# Patient Record
Sex: Female | Born: 1950
Health system: Southern US, Community
[De-identification: ages and names within clinical notes are randomized; demographics above are authoritative.]

## PROBLEM LIST (undated history)

## (undated) DIAGNOSIS — I10 Essential (primary) hypertension: Secondary | ICD-10-CM

## (undated) DIAGNOSIS — Z9889 Other specified postprocedural states: Secondary | ICD-10-CM

## (undated) DIAGNOSIS — M2550 Pain in unspecified joint: Secondary | ICD-10-CM

## (undated) DIAGNOSIS — F329 Major depressive disorder, single episode, unspecified: Secondary | ICD-10-CM

## (undated) DIAGNOSIS — K219 Gastro-esophageal reflux disease without esophagitis: Secondary | ICD-10-CM

## (undated) DIAGNOSIS — L4050 Arthropathic psoriasis, unspecified: Secondary | ICD-10-CM

## (undated) DIAGNOSIS — F419 Anxiety disorder, unspecified: Secondary | ICD-10-CM

## (undated) DIAGNOSIS — K589 Irritable bowel syndrome without diarrhea: Secondary | ICD-10-CM

## (undated) DIAGNOSIS — E669 Obesity, unspecified: Secondary | ICD-10-CM

## (undated) DIAGNOSIS — M1990 Unspecified osteoarthritis, unspecified site: Secondary | ICD-10-CM

## (undated) DIAGNOSIS — K759 Inflammatory liver disease, unspecified: Secondary | ICD-10-CM

## (undated) DIAGNOSIS — R112 Nausea with vomiting, unspecified: Secondary | ICD-10-CM

## (undated) DIAGNOSIS — Z8719 Personal history of other diseases of the digestive system: Secondary | ICD-10-CM

## (undated) DIAGNOSIS — J189 Pneumonia, unspecified organism: Secondary | ICD-10-CM

## (undated) DIAGNOSIS — D649 Anemia, unspecified: Secondary | ICD-10-CM

## (undated) DIAGNOSIS — F32A Depression, unspecified: Secondary | ICD-10-CM

## (undated) DIAGNOSIS — I2699 Other pulmonary embolism without acute cor pulmonale: Secondary | ICD-10-CM

## (undated) DIAGNOSIS — I7300 Raynaud's syndrome without gangrene: Secondary | ICD-10-CM

## (undated) DIAGNOSIS — Z79899 Other long term (current) drug therapy: Secondary | ICD-10-CM

## (undated) DIAGNOSIS — I499 Cardiac arrhythmia, unspecified: Secondary | ICD-10-CM

## (undated) HISTORY — PX: JOINT REPLACEMENT: SHX530

## (undated) HISTORY — DX: Anemia, unspecified: D64.9

## (undated) HISTORY — DX: Unspecified osteoarthritis, unspecified site: M19.90

## (undated) HISTORY — DX: Irritable bowel syndrome, unspecified: K58.9

## (undated) HISTORY — DX: Essential (primary) hypertension: I10

## (undated) HISTORY — DX: Pain in unspecified joint: M25.50

## (undated) HISTORY — DX: Arthropathic psoriasis, unspecified: L40.50

## (undated) HISTORY — DX: Gastro-esophageal reflux disease without esophagitis: K21.9

## (undated) HISTORY — PX: TUBAL LIGATION: SHX77

## (undated) HISTORY — DX: Obesity, unspecified: E66.9

## (undated) HISTORY — DX: Raynaud's syndrome without gangrene: I73.00

## (undated) HISTORY — DX: Other long term (current) drug therapy: Z79.899

## (undated) HISTORY — PX: GASTRIC BYPASS: SHX52

## (undated) HISTORY — PX: ABDOMINAL HYSTERECTOMY: SHX81

## (undated) HISTORY — PX: COLONOSCOPY: SHX174

## (undated) HISTORY — DX: Anxiety disorder, unspecified: F41.9

## (undated) HISTORY — PX: HERNIA REPAIR: SHX51

## (undated) HISTORY — PX: APPENDECTOMY: SHX54

---

## 1976-02-19 HISTORY — PX: GASTRIC BYPASS: SHX52

## 1980-02-19 HISTORY — PX: TUBAL LIGATION: SHX77

## 1997-07-06 ENCOUNTER — Encounter: Admission: RE | Admit: 1997-07-06 | Discharge: 1997-07-06 | Payer: Self-pay | Admitting: Family Medicine

## 1997-07-11 ENCOUNTER — Encounter: Admission: RE | Admit: 1997-07-11 | Discharge: 1997-07-11 | Payer: Self-pay | Admitting: Sports Medicine

## 1997-07-18 ENCOUNTER — Encounter: Admission: RE | Admit: 1997-07-18 | Discharge: 1997-07-18 | Payer: Self-pay | Admitting: Family Medicine

## 1997-10-02 ENCOUNTER — Emergency Department (HOSPITAL_COMMUNITY): Admission: EM | Admit: 1997-10-02 | Discharge: 1997-10-02 | Payer: Self-pay | Admitting: Emergency Medicine

## 1997-11-17 ENCOUNTER — Encounter: Admission: RE | Admit: 1997-11-17 | Discharge: 1997-11-17 | Payer: Self-pay | Admitting: Sports Medicine

## 1997-11-19 ENCOUNTER — Ambulatory Visit (HOSPITAL_COMMUNITY): Admission: RE | Admit: 1997-11-19 | Discharge: 1997-11-19 | Payer: Self-pay | Admitting: Sports Medicine

## 1997-11-23 ENCOUNTER — Ambulatory Visit: Admission: RE | Admit: 1997-11-23 | Discharge: 1997-11-23 | Payer: Self-pay | Admitting: Obstetrics and Gynecology

## 1998-03-09 ENCOUNTER — Encounter: Admission: RE | Admit: 1998-03-09 | Discharge: 1998-03-09 | Payer: Self-pay | Admitting: Sports Medicine

## 1998-03-21 HISTORY — PX: ABDOMINAL HYSTERECTOMY: SHX81

## 1998-04-17 ENCOUNTER — Encounter: Payer: Self-pay | Admitting: Obstetrics and Gynecology

## 1998-04-17 ENCOUNTER — Observation Stay (HOSPITAL_COMMUNITY): Admission: RE | Admit: 1998-04-17 | Discharge: 1998-04-18 | Payer: Self-pay | Admitting: Obstetrics and Gynecology

## 1998-05-04 ENCOUNTER — Encounter: Payer: Self-pay | Admitting: Urology

## 1998-05-04 ENCOUNTER — Ambulatory Visit (HOSPITAL_COMMUNITY): Admission: RE | Admit: 1998-05-04 | Discharge: 1998-05-04 | Payer: Self-pay | Admitting: Urology

## 1998-06-06 ENCOUNTER — Encounter: Admission: RE | Admit: 1998-06-06 | Discharge: 1998-06-06 | Payer: Self-pay | Admitting: Family Medicine

## 1998-06-26 ENCOUNTER — Ambulatory Visit (HOSPITAL_COMMUNITY): Admission: RE | Admit: 1998-06-26 | Discharge: 1998-06-26 | Payer: Self-pay | Admitting: Urology

## 1998-06-26 ENCOUNTER — Encounter: Payer: Self-pay | Admitting: Urology

## 1998-07-14 ENCOUNTER — Encounter: Admission: RE | Admit: 1998-07-14 | Discharge: 1998-07-14 | Payer: Self-pay | Admitting: Sports Medicine

## 1998-09-19 ENCOUNTER — Encounter: Admission: RE | Admit: 1998-09-19 | Discharge: 1998-09-19 | Payer: Self-pay | Admitting: Family Medicine

## 1998-12-21 ENCOUNTER — Encounter: Admission: RE | Admit: 1998-12-21 | Discharge: 1998-12-21 | Payer: Self-pay | Admitting: Sports Medicine

## 1998-12-25 ENCOUNTER — Ambulatory Visit (HOSPITAL_COMMUNITY): Admission: RE | Admit: 1998-12-25 | Discharge: 1998-12-25 | Payer: Self-pay | Admitting: Otolaryngology

## 1998-12-25 ENCOUNTER — Encounter: Payer: Self-pay | Admitting: Otolaryngology

## 1999-01-09 ENCOUNTER — Encounter: Admission: RE | Admit: 1999-01-09 | Discharge: 1999-01-09 | Payer: Self-pay | Admitting: Family Medicine

## 1999-01-18 ENCOUNTER — Encounter: Payer: Self-pay | Admitting: Sports Medicine

## 1999-01-18 ENCOUNTER — Ambulatory Visit (HOSPITAL_COMMUNITY): Admission: RE | Admit: 1999-01-18 | Discharge: 1999-01-18 | Payer: Self-pay | Admitting: Sports Medicine

## 1999-02-08 ENCOUNTER — Encounter: Admission: RE | Admit: 1999-02-08 | Discharge: 1999-02-08 | Payer: Self-pay | Admitting: Family Medicine

## 1999-02-22 ENCOUNTER — Encounter: Admission: RE | Admit: 1999-02-22 | Discharge: 1999-02-22 | Payer: Self-pay | Admitting: Family Medicine

## 1999-02-26 ENCOUNTER — Encounter: Admission: RE | Admit: 1999-02-26 | Discharge: 1999-02-26 | Payer: Self-pay | Admitting: Family Medicine

## 1999-02-28 ENCOUNTER — Encounter: Admission: RE | Admit: 1999-02-28 | Discharge: 1999-02-28 | Payer: Self-pay | Admitting: Family Medicine

## 1999-03-08 ENCOUNTER — Encounter: Admission: RE | Admit: 1999-03-08 | Discharge: 1999-03-08 | Payer: Self-pay | Admitting: Sports Medicine

## 1999-03-22 ENCOUNTER — Encounter: Admission: RE | Admit: 1999-03-22 | Discharge: 1999-03-22 | Payer: Self-pay | Admitting: Sports Medicine

## 1999-04-05 ENCOUNTER — Encounter: Admission: RE | Admit: 1999-04-05 | Discharge: 1999-04-05 | Payer: Self-pay | Admitting: Family Medicine

## 1999-04-19 ENCOUNTER — Encounter: Admission: RE | Admit: 1999-04-19 | Discharge: 1999-04-19 | Payer: Self-pay | Admitting: Family Medicine

## 1999-05-02 ENCOUNTER — Other Ambulatory Visit: Admission: RE | Admit: 1999-05-02 | Discharge: 1999-05-02 | Payer: Self-pay | Admitting: Obstetrics and Gynecology

## 1999-05-24 ENCOUNTER — Encounter: Admission: RE | Admit: 1999-05-24 | Discharge: 1999-05-24 | Payer: Self-pay | Admitting: Family Medicine

## 1999-06-25 ENCOUNTER — Encounter: Admission: RE | Admit: 1999-06-25 | Discharge: 1999-06-25 | Payer: Self-pay | Admitting: Family Medicine

## 1999-07-23 ENCOUNTER — Encounter: Admission: RE | Admit: 1999-07-23 | Discharge: 1999-07-23 | Payer: Self-pay | Admitting: Family Medicine

## 1999-10-20 HISTORY — PX: TOTAL KNEE ARTHROPLASTY: SHX125

## 1999-10-29 ENCOUNTER — Encounter: Payer: Self-pay | Admitting: Orthopaedic Surgery

## 1999-11-01 ENCOUNTER — Inpatient Hospital Stay (HOSPITAL_COMMUNITY): Admission: RE | Admit: 1999-11-01 | Discharge: 1999-11-06 | Payer: Self-pay | Admitting: Orthopaedic Surgery

## 1999-12-03 ENCOUNTER — Encounter: Admission: RE | Admit: 1999-12-03 | Discharge: 2000-03-02 | Payer: Self-pay | Admitting: Orthopaedic Surgery

## 2000-02-11 ENCOUNTER — Emergency Department (HOSPITAL_COMMUNITY): Admission: EM | Admit: 2000-02-11 | Discharge: 2000-02-11 | Payer: Self-pay | Admitting: Emergency Medicine

## 2000-02-11 ENCOUNTER — Encounter: Payer: Self-pay | Admitting: Emergency Medicine

## 2000-02-25 ENCOUNTER — Encounter: Admission: RE | Admit: 2000-02-25 | Discharge: 2000-02-25 | Payer: Self-pay | Admitting: Family Medicine

## 2000-03-03 ENCOUNTER — Encounter: Admission: RE | Admit: 2000-03-03 | Discharge: 2000-03-03 | Payer: Self-pay | Admitting: Family Medicine

## 2000-03-12 ENCOUNTER — Encounter: Payer: Self-pay | Admitting: Obstetrics and Gynecology

## 2000-03-12 ENCOUNTER — Ambulatory Visit (HOSPITAL_COMMUNITY): Admission: RE | Admit: 2000-03-12 | Discharge: 2000-03-12 | Payer: Self-pay | Admitting: Obstetrics and Gynecology

## 2000-03-23 ENCOUNTER — Encounter: Payer: Self-pay | Admitting: Emergency Medicine

## 2000-03-23 ENCOUNTER — Inpatient Hospital Stay (HOSPITAL_COMMUNITY): Admission: EM | Admit: 2000-03-23 | Discharge: 2000-03-24 | Payer: Self-pay | Admitting: Emergency Medicine

## 2000-04-08 ENCOUNTER — Encounter: Payer: Self-pay | Admitting: Interventional Cardiology

## 2000-04-08 ENCOUNTER — Ambulatory Visit (HOSPITAL_COMMUNITY): Admission: RE | Admit: 2000-04-08 | Discharge: 2000-04-08 | Payer: Self-pay | Admitting: Interventional Cardiology

## 2000-04-18 HISTORY — PX: LAPAROSCOPIC CHOLECYSTECTOMY: SUR755

## 2000-04-24 ENCOUNTER — Ambulatory Visit (HOSPITAL_COMMUNITY): Admission: RE | Admit: 2000-04-24 | Discharge: 2000-04-25 | Payer: Self-pay | Admitting: Surgery

## 2000-05-12 ENCOUNTER — Encounter: Admission: RE | Admit: 2000-05-12 | Discharge: 2000-05-12 | Payer: Self-pay | Admitting: Family Medicine

## 2000-08-07 ENCOUNTER — Encounter: Admission: RE | Admit: 2000-08-07 | Discharge: 2000-08-07 | Payer: Self-pay | Admitting: Sports Medicine

## 2000-09-15 ENCOUNTER — Encounter: Admission: RE | Admit: 2000-09-15 | Discharge: 2000-09-15 | Payer: Self-pay | Admitting: Family Medicine

## 2000-10-10 ENCOUNTER — Encounter: Admission: RE | Admit: 2000-10-10 | Discharge: 2000-10-10 | Payer: Self-pay | Admitting: Family Medicine

## 2000-11-14 ENCOUNTER — Encounter: Admission: RE | Admit: 2000-11-14 | Discharge: 2000-11-14 | Payer: Self-pay | Admitting: Family Medicine

## 2000-12-09 ENCOUNTER — Encounter: Admission: RE | Admit: 2000-12-09 | Discharge: 2000-12-09 | Payer: Self-pay | Admitting: Family Medicine

## 2000-12-12 ENCOUNTER — Encounter: Admission: RE | Admit: 2000-12-12 | Discharge: 2000-12-12 | Payer: Self-pay | Admitting: Family Medicine

## 2001-01-01 ENCOUNTER — Encounter: Admission: RE | Admit: 2001-01-01 | Discharge: 2001-01-01 | Payer: Self-pay | Admitting: Sports Medicine

## 2001-02-02 ENCOUNTER — Encounter: Admission: RE | Admit: 2001-02-02 | Discharge: 2001-02-02 | Payer: Self-pay | Admitting: Family Medicine

## 2001-03-18 ENCOUNTER — Encounter: Admission: RE | Admit: 2001-03-18 | Discharge: 2001-03-18 | Payer: Self-pay | Admitting: Family Medicine

## 2001-04-08 ENCOUNTER — Encounter: Admission: RE | Admit: 2001-04-08 | Discharge: 2001-04-08 | Payer: Self-pay | Admitting: Family Medicine

## 2001-04-14 ENCOUNTER — Other Ambulatory Visit: Admission: RE | Admit: 2001-04-14 | Discharge: 2001-04-14 | Payer: Self-pay | Admitting: Obstetrics and Gynecology

## 2001-04-16 ENCOUNTER — Encounter: Admission: RE | Admit: 2001-04-16 | Discharge: 2001-04-16 | Payer: Self-pay | Admitting: Sports Medicine

## 2001-06-25 ENCOUNTER — Encounter: Admission: RE | Admit: 2001-06-25 | Discharge: 2001-06-25 | Payer: Self-pay | Admitting: Sports Medicine

## 2001-09-11 ENCOUNTER — Encounter: Payer: Self-pay | Admitting: Sports Medicine

## 2001-09-11 ENCOUNTER — Ambulatory Visit (HOSPITAL_COMMUNITY): Admission: RE | Admit: 2001-09-11 | Discharge: 2001-09-11 | Payer: Self-pay | Admitting: Sports Medicine

## 2001-10-09 ENCOUNTER — Ambulatory Visit (HOSPITAL_COMMUNITY): Admission: RE | Admit: 2001-10-09 | Discharge: 2001-10-09 | Payer: Self-pay | Admitting: Gastroenterology

## 2001-10-26 ENCOUNTER — Ambulatory Visit (HOSPITAL_COMMUNITY): Admission: RE | Admit: 2001-10-26 | Discharge: 2001-10-26 | Payer: Self-pay | Admitting: Sports Medicine

## 2001-10-26 ENCOUNTER — Encounter: Admission: RE | Admit: 2001-10-26 | Discharge: 2001-10-26 | Payer: Self-pay | Admitting: Family Medicine

## 2001-10-26 ENCOUNTER — Encounter: Payer: Self-pay | Admitting: Sports Medicine

## 2001-11-10 ENCOUNTER — Encounter: Admission: RE | Admit: 2001-11-10 | Discharge: 2001-11-10 | Payer: Self-pay | Admitting: Sports Medicine

## 2002-01-07 ENCOUNTER — Encounter: Admission: RE | Admit: 2002-01-07 | Discharge: 2002-01-07 | Payer: Self-pay | Admitting: Family Medicine

## 2002-01-19 ENCOUNTER — Ambulatory Visit (HOSPITAL_COMMUNITY): Admission: RE | Admit: 2002-01-19 | Discharge: 2002-01-19 | Payer: Self-pay | Admitting: Obstetrics and Gynecology

## 2002-01-19 ENCOUNTER — Encounter: Payer: Self-pay | Admitting: Obstetrics and Gynecology

## 2002-04-29 ENCOUNTER — Encounter: Admission: RE | Admit: 2002-04-29 | Discharge: 2002-04-29 | Payer: Self-pay | Admitting: Sports Medicine

## 2002-06-08 ENCOUNTER — Encounter: Admission: RE | Admit: 2002-06-08 | Discharge: 2002-06-08 | Payer: Self-pay | Admitting: Sports Medicine

## 2002-06-14 ENCOUNTER — Ambulatory Visit (HOSPITAL_COMMUNITY): Admission: RE | Admit: 2002-06-14 | Discharge: 2002-06-14 | Payer: Self-pay | Admitting: *Deleted

## 2002-06-14 ENCOUNTER — Encounter: Payer: Self-pay | Admitting: *Deleted

## 2002-06-29 ENCOUNTER — Encounter: Admission: RE | Admit: 2002-06-29 | Discharge: 2002-06-29 | Payer: Self-pay | Admitting: Family Medicine

## 2002-07-14 ENCOUNTER — Encounter: Admission: RE | Admit: 2002-07-14 | Discharge: 2002-07-14 | Payer: Self-pay | Admitting: Sports Medicine

## 2002-09-19 HISTORY — PX: LAPAROSCOPIC INCISIONAL / UMBILICAL / VENTRAL HERNIA REPAIR: SUR789

## 2002-09-28 ENCOUNTER — Inpatient Hospital Stay (HOSPITAL_COMMUNITY): Admission: RE | Admit: 2002-09-28 | Discharge: 2002-09-30 | Payer: Self-pay | Admitting: General Surgery

## 2003-01-05 ENCOUNTER — Encounter: Admission: RE | Admit: 2003-01-05 | Discharge: 2003-01-05 | Payer: Self-pay | Admitting: Sports Medicine

## 2003-01-26 ENCOUNTER — Encounter: Admission: RE | Admit: 2003-01-26 | Discharge: 2003-01-26 | Payer: Self-pay | Admitting: Sports Medicine

## 2003-02-09 ENCOUNTER — Other Ambulatory Visit: Admission: RE | Admit: 2003-02-09 | Discharge: 2003-02-09 | Payer: Self-pay | Admitting: Obstetrics and Gynecology

## 2003-03-02 ENCOUNTER — Encounter: Admission: RE | Admit: 2003-03-02 | Discharge: 2003-03-02 | Payer: Self-pay | Admitting: Family Medicine

## 2003-05-25 ENCOUNTER — Encounter: Admission: RE | Admit: 2003-05-25 | Discharge: 2003-05-25 | Payer: Self-pay | Admitting: Sports Medicine

## 2003-06-08 ENCOUNTER — Encounter: Admission: RE | Admit: 2003-06-08 | Discharge: 2003-06-08 | Payer: Self-pay | Admitting: Family Medicine

## 2003-06-15 ENCOUNTER — Encounter: Admission: RE | Admit: 2003-06-15 | Discharge: 2003-06-15 | Payer: Self-pay | Admitting: Family Medicine

## 2003-06-15 ENCOUNTER — Ambulatory Visit (HOSPITAL_COMMUNITY): Admission: RE | Admit: 2003-06-15 | Discharge: 2003-06-15 | Payer: Self-pay | Admitting: Sports Medicine

## 2003-10-03 ENCOUNTER — Encounter: Admission: RE | Admit: 2003-10-03 | Discharge: 2003-10-03 | Payer: Self-pay | Admitting: Sports Medicine

## 2003-10-06 ENCOUNTER — Ambulatory Visit (HOSPITAL_COMMUNITY): Admission: RE | Admit: 2003-10-06 | Discharge: 2003-10-06 | Payer: Self-pay | Admitting: Orthopaedic Surgery

## 2003-10-17 ENCOUNTER — Ambulatory Visit (HOSPITAL_COMMUNITY): Admission: RE | Admit: 2003-10-17 | Discharge: 2003-10-17 | Payer: Self-pay | Admitting: Orthopaedic Surgery

## 2003-10-20 HISTORY — PX: REVISION TOTAL KNEE ARTHROPLASTY: SHX767

## 2003-11-01 ENCOUNTER — Ambulatory Visit: Payer: Self-pay | Admitting: Family Medicine

## 2003-11-15 ENCOUNTER — Inpatient Hospital Stay (HOSPITAL_COMMUNITY): Admission: RE | Admit: 2003-11-15 | Discharge: 2003-11-19 | Payer: Self-pay | Admitting: Orthopaedic Surgery

## 2004-01-05 ENCOUNTER — Ambulatory Visit: Payer: Self-pay | Admitting: Sports Medicine

## 2004-02-21 ENCOUNTER — Ambulatory Visit (HOSPITAL_COMMUNITY): Admission: RE | Admit: 2004-02-21 | Discharge: 2004-02-21 | Payer: Self-pay | Admitting: Obstetrics and Gynecology

## 2004-03-15 ENCOUNTER — Other Ambulatory Visit: Admission: RE | Admit: 2004-03-15 | Discharge: 2004-03-15 | Payer: Self-pay | Admitting: Obstetrics and Gynecology

## 2004-06-28 ENCOUNTER — Ambulatory Visit: Payer: Self-pay | Admitting: Family Medicine

## 2004-10-11 ENCOUNTER — Ambulatory Visit: Payer: Self-pay | Admitting: Sports Medicine

## 2004-12-20 ENCOUNTER — Ambulatory Visit: Payer: Self-pay | Admitting: Family Medicine

## 2005-01-08 ENCOUNTER — Ambulatory Visit: Payer: Self-pay | Admitting: Sports Medicine

## 2005-02-15 ENCOUNTER — Ambulatory Visit (HOSPITAL_COMMUNITY): Admission: RE | Admit: 2005-02-15 | Discharge: 2005-02-15 | Payer: Self-pay | Admitting: Sports Medicine

## 2005-03-06 ENCOUNTER — Ambulatory Visit: Payer: Self-pay | Admitting: Sports Medicine

## 2005-04-11 ENCOUNTER — Ambulatory Visit: Payer: Self-pay | Admitting: Sports Medicine

## 2005-06-15 ENCOUNTER — Emergency Department (HOSPITAL_COMMUNITY): Admission: EM | Admit: 2005-06-15 | Discharge: 2005-06-15 | Payer: Self-pay | Admitting: Family Medicine

## 2005-06-17 ENCOUNTER — Emergency Department (HOSPITAL_COMMUNITY): Admission: EM | Admit: 2005-06-17 | Discharge: 2005-06-17 | Payer: Self-pay | Admitting: Family Medicine

## 2005-07-01 ENCOUNTER — Ambulatory Visit: Payer: Self-pay | Admitting: Family Medicine

## 2005-07-01 ENCOUNTER — Ambulatory Visit (HOSPITAL_COMMUNITY): Admission: RE | Admit: 2005-07-01 | Discharge: 2005-07-01 | Payer: Self-pay | Admitting: Family Medicine

## 2005-07-11 ENCOUNTER — Ambulatory Visit: Payer: Self-pay | Admitting: Sports Medicine

## 2005-07-31 ENCOUNTER — Ambulatory Visit (HOSPITAL_COMMUNITY): Admission: RE | Admit: 2005-07-31 | Discharge: 2005-07-31 | Payer: Self-pay | Admitting: Orthopaedic Surgery

## 2005-11-06 ENCOUNTER — Ambulatory Visit (HOSPITAL_COMMUNITY): Admission: RE | Admit: 2005-11-06 | Discharge: 2005-11-06 | Payer: Self-pay | Admitting: Sports Medicine

## 2005-11-14 ENCOUNTER — Ambulatory Visit: Payer: Self-pay | Admitting: Sports Medicine

## 2006-03-13 ENCOUNTER — Ambulatory Visit: Payer: Self-pay | Admitting: Sports Medicine

## 2006-03-14 ENCOUNTER — Ambulatory Visit (HOSPITAL_COMMUNITY): Admission: RE | Admit: 2006-03-14 | Discharge: 2006-03-14 | Payer: Self-pay | Admitting: Vascular Surgery

## 2006-04-17 DIAGNOSIS — M2550 Pain in unspecified joint: Secondary | ICD-10-CM | POA: Insufficient documentation

## 2006-04-17 DIAGNOSIS — N952 Postmenopausal atrophic vaginitis: Secondary | ICD-10-CM | POA: Insufficient documentation

## 2006-04-17 DIAGNOSIS — K219 Gastro-esophageal reflux disease without esophagitis: Secondary | ICD-10-CM | POA: Insufficient documentation

## 2006-04-17 DIAGNOSIS — F410 Panic disorder [episodic paroxysmal anxiety] without agoraphobia: Secondary | ICD-10-CM | POA: Insufficient documentation

## 2006-04-17 DIAGNOSIS — E669 Obesity, unspecified: Secondary | ICD-10-CM | POA: Insufficient documentation

## 2006-04-17 DIAGNOSIS — K8020 Calculus of gallbladder without cholecystitis without obstruction: Secondary | ICD-10-CM | POA: Insufficient documentation

## 2006-05-20 ENCOUNTER — Encounter (HOSPITAL_COMMUNITY): Admission: RE | Admit: 2006-05-20 | Discharge: 2006-05-20 | Payer: Self-pay | Admitting: Interventional Cardiology

## 2006-07-03 ENCOUNTER — Ambulatory Visit: Payer: Self-pay | Admitting: Sports Medicine

## 2006-07-03 DIAGNOSIS — Z96659 Presence of unspecified artificial knee joint: Secondary | ICD-10-CM | POA: Insufficient documentation

## 2006-07-03 LAB — CONVERTED CEMR LAB
HCT: 39 %
MCV: 88.7 fL
Platelets: 223 10*3/uL
RBC: 4.39 M/uL
Vitamin B-12: 207 pg/mL — ABNORMAL LOW (ref 211–911)

## 2006-07-07 ENCOUNTER — Ambulatory Visit: Payer: Self-pay | Admitting: Sports Medicine

## 2006-08-12 ENCOUNTER — Encounter: Payer: Self-pay | Admitting: Sports Medicine

## 2006-10-02 ENCOUNTER — Ambulatory Visit: Payer: Self-pay | Admitting: Sports Medicine

## 2006-10-02 DIAGNOSIS — M7750 Other enthesopathy of unspecified foot: Secondary | ICD-10-CM | POA: Insufficient documentation

## 2007-02-18 ENCOUNTER — Encounter: Payer: Self-pay | Admitting: Sports Medicine

## 2007-02-19 HISTORY — PX: REVISION TOTAL KNEE ARTHROPLASTY: SHX767

## 2007-03-02 ENCOUNTER — Inpatient Hospital Stay (HOSPITAL_COMMUNITY): Admission: RE | Admit: 2007-03-02 | Discharge: 2007-03-06 | Payer: Self-pay | Admitting: Orthopedic Surgery

## 2007-03-16 ENCOUNTER — Encounter: Payer: Self-pay | Admitting: Sports Medicine

## 2007-03-23 ENCOUNTER — Encounter: Admission: RE | Admit: 2007-03-23 | Discharge: 2007-05-27 | Payer: Self-pay | Admitting: Orthopedic Surgery

## 2007-04-07 ENCOUNTER — Encounter: Payer: Self-pay | Admitting: *Deleted

## 2007-06-24 ENCOUNTER — Encounter: Payer: Self-pay | Admitting: Sports Medicine

## 2007-07-01 ENCOUNTER — Ambulatory Visit (HOSPITAL_COMMUNITY): Admission: RE | Admit: 2007-07-01 | Discharge: 2007-07-01 | Payer: Self-pay | Admitting: Sports Medicine

## 2007-07-09 ENCOUNTER — Ambulatory Visit: Payer: Self-pay | Admitting: Sports Medicine

## 2007-07-10 LAB — CONVERTED CEMR LAB
MCHC: 32.9 g/dL (ref 30.0–36.0)
Platelets: 187 10*3/uL (ref 150–400)
RDW: 15.2 % (ref 11.5–15.5)
TSH: 2.313 microintl units/mL (ref 0.350–5.50)

## 2007-07-28 ENCOUNTER — Ambulatory Visit (HOSPITAL_COMMUNITY): Admission: RE | Admit: 2007-07-28 | Discharge: 2007-07-28 | Payer: Self-pay | Admitting: Gastroenterology

## 2007-07-28 ENCOUNTER — Encounter (INDEPENDENT_AMBULATORY_CARE_PROVIDER_SITE_OTHER): Payer: Self-pay | Admitting: Gastroenterology

## 2007-07-31 ENCOUNTER — Encounter: Payer: Self-pay | Admitting: *Deleted

## 2007-08-05 ENCOUNTER — Encounter: Payer: Self-pay | Admitting: Sports Medicine

## 2007-10-07 ENCOUNTER — Ambulatory Visit: Payer: Self-pay | Admitting: Family Medicine

## 2007-10-30 ENCOUNTER — Ambulatory Visit: Payer: Self-pay | Admitting: Family Medicine

## 2007-10-30 DIAGNOSIS — R002 Palpitations: Secondary | ICD-10-CM | POA: Insufficient documentation

## 2007-11-23 ENCOUNTER — Telehealth: Payer: Self-pay | Admitting: *Deleted

## 2007-12-04 ENCOUNTER — Telehealth: Payer: Self-pay | Admitting: *Deleted

## 2007-12-21 ENCOUNTER — Telehealth (INDEPENDENT_AMBULATORY_CARE_PROVIDER_SITE_OTHER): Payer: Self-pay | Admitting: *Deleted

## 2007-12-21 ENCOUNTER — Ambulatory Visit: Payer: Self-pay | Admitting: Family Medicine

## 2008-02-19 DIAGNOSIS — J189 Pneumonia, unspecified organism: Secondary | ICD-10-CM

## 2008-02-19 HISTORY — DX: Pneumonia, unspecified organism: J18.9

## 2008-03-30 ENCOUNTER — Ambulatory Visit: Payer: Self-pay | Admitting: Family Medicine

## 2008-04-27 ENCOUNTER — Ambulatory Visit: Payer: Self-pay | Admitting: Family Medicine

## 2008-04-27 DIAGNOSIS — I10 Essential (primary) hypertension: Secondary | ICD-10-CM | POA: Insufficient documentation

## 2008-04-27 LAB — CONVERTED CEMR LAB
Calcium: 9.4 mg/dL (ref 8.4–10.5)
Direct LDL: 94 mg/dL
Potassium: 4.6 meq/L (ref 3.5–5.3)
Sodium: 140 meq/L (ref 135–145)

## 2008-04-29 ENCOUNTER — Encounter: Payer: Self-pay | Admitting: Family Medicine

## 2008-05-03 ENCOUNTER — Encounter: Payer: Self-pay | Admitting: Family Medicine

## 2008-05-03 LAB — CONVERTED CEMR LAB

## 2008-06-24 ENCOUNTER — Ambulatory Visit: Payer: Self-pay | Admitting: Family Medicine

## 2008-06-24 ENCOUNTER — Telehealth (INDEPENDENT_AMBULATORY_CARE_PROVIDER_SITE_OTHER): Payer: Self-pay | Admitting: *Deleted

## 2008-06-29 ENCOUNTER — Ambulatory Visit: Payer: Self-pay | Admitting: Family Medicine

## 2008-07-01 ENCOUNTER — Ambulatory Visit (HOSPITAL_COMMUNITY): Admission: RE | Admit: 2008-07-01 | Discharge: 2008-07-01 | Payer: Self-pay | Admitting: Family Medicine

## 2008-07-27 ENCOUNTER — Ambulatory Visit: Payer: Self-pay | Admitting: Family Medicine

## 2008-07-27 LAB — CONVERTED CEMR LAB
ALT: 49 units/L — ABNORMAL HIGH (ref 0–35)
AST: 43 units/L — ABNORMAL HIGH (ref 0–37)
Albumin: 3.6 g/dL (ref 3.5–5.2)
Alkaline Phosphatase: 83 units/L (ref 39–117)
Calcium: 8.5 mg/dL (ref 8.4–10.5)
Chloride: 105 meq/L (ref 96–112)
Potassium: 4.8 meq/L (ref 3.5–5.3)

## 2008-07-29 ENCOUNTER — Encounter: Payer: Self-pay | Admitting: Family Medicine

## 2008-10-03 ENCOUNTER — Ambulatory Visit: Payer: Self-pay | Admitting: Sports Medicine

## 2008-10-03 DIAGNOSIS — M25519 Pain in unspecified shoulder: Secondary | ICD-10-CM | POA: Insufficient documentation

## 2009-01-03 ENCOUNTER — Ambulatory Visit: Payer: Self-pay | Admitting: Sports Medicine

## 2009-02-20 ENCOUNTER — Telehealth: Payer: Self-pay | Admitting: Family Medicine

## 2009-04-05 ENCOUNTER — Ambulatory Visit: Payer: Self-pay | Admitting: Family Medicine

## 2009-04-05 LAB — CONVERTED CEMR LAB
ALT: 25 units/L (ref 0–35)
AST: 27 units/L (ref 0–37)
Alkaline Phosphatase: 95 units/L (ref 39–117)
BUN: 12 mg/dL (ref 6–23)
Calcium: 8.9 mg/dL (ref 8.4–10.5)
Chloride: 101 meq/L (ref 96–112)
Creatinine, Ser: 0.63 mg/dL (ref 0.40–1.20)
HCT: 41 % (ref 36.0–46.0)
HDL: 57 mg/dL (ref >39)
Hemoglobin: 13.5 g/dL (ref 12.0–15.0)
LDL Cholesterol: 113 mg/dL — ABNORMAL HIGH (ref 0–99)
MCHC: 32.9 g/dL (ref 30.0–36.0)
Platelets: 215 10*3/uL (ref 150–400)
Potassium: 4.5 meq/L (ref 3.5–5.3)
RDW: 13 % (ref 11.5–15.5)
TSH: 2.515 microintl units/mL (ref 0.350–4.500)
Total CHOL/HDL Ratio: 3.4
VLDL: 22 mg/dL (ref 0–40)

## 2009-04-06 ENCOUNTER — Encounter: Payer: Self-pay | Admitting: Family Medicine

## 2009-08-24 ENCOUNTER — Ambulatory Visit (HOSPITAL_COMMUNITY): Admission: RE | Admit: 2009-08-24 | Discharge: 2009-08-24 | Payer: Self-pay | Admitting: Family Medicine

## 2009-10-04 ENCOUNTER — Telehealth: Payer: Self-pay | Admitting: Family Medicine

## 2009-10-04 ENCOUNTER — Telehealth: Payer: Self-pay | Admitting: *Deleted

## 2009-10-11 ENCOUNTER — Ambulatory Visit: Payer: Self-pay | Admitting: Family Medicine

## 2009-10-11 LAB — CONVERTED CEMR LAB
ALT: 12 units/L (ref 0–35)
AST: 17 units/L (ref 0–37)
Alkaline Phosphatase: 76 units/L (ref 39–117)
CO2: 28 meq/L (ref 19–32)
Creatinine, Ser: 0.59 mg/dL (ref 0.40–1.20)
Sodium: 140 meq/L (ref 135–145)
Total Bilirubin: 0.3 mg/dL (ref 0.3–1.2)
Total Protein: 6.1 g/dL (ref 6.0–8.3)

## 2009-10-13 ENCOUNTER — Encounter: Payer: Self-pay | Admitting: Family Medicine

## 2009-11-21 ENCOUNTER — Telehealth: Payer: Self-pay | Admitting: Family Medicine

## 2010-01-01 ENCOUNTER — Ambulatory Visit: Payer: Self-pay | Admitting: Family Medicine

## 2010-03-20 NOTE — Letter (Signed)
Summary: LAB Letter  [facility]  [address]   [place], [place] [address]   Phone: [phone]  Fax: [phone]    04/06/2009  [name] [address] [place], [place]  [address]  Dear Ms. Laduke,  Your total cholesterol is 192, HDL 57 and LDL 113 which is perfect. All of you other lab work including CBC, chemistries, blood sugar, electrolytes liver and kidney function, platelets and thyroid are totally normal. Per our discussion of your fatigue, shortness of breath with exercise and other depressive symptoms, I think it is most likely that these symptoms are caused by a slight worsening of your depression. I would go forward with therapy, continue your current meds, and let me see you back in 4-6 weeks.  Please cakk me with questions and please feel free to come in sooner if you need to.         Sincerely,   Denny Levy MD  Appended Document: LAB Letter mailed.

## 2010-03-20 NOTE — Assessment & Plan Note (Signed)
Summary: FU SHOULDER/MJD   Vital Signs:  Patient profile:   60 year old female BP sitting:   125 / 82  Vitals Entered By: Lillia Pauls CMA (January 03, 2009 8:59 AM)  Primary Provider:  Enid Baas MD   History of Present Illness: 1. Right shoulder pain:  Pain is not much better.  Her ability to move it is slightly improved.  The exercises have been helping her, but only if she does them everyday.  Pain is rated 8/10 at its worst and now a 5/10.  Pain is still in the anterior shoulder but is diffuse with certain movements.  It is most difficult for her to put the back of her hand on her back.  She is having difficulty sleeping.  She is interested in trying the steroid injection.   Current Problems (verified): 1)  Rotator Cuff Syndrome, Right  (ICD-726.10) 2)  Shoulder Pain, Right  (ICD-719.41) 3)  Encounter For Long-term Use of Other Medications  (ICD-V58.69) 4)  Candidiasis of Unspecified Site  (ICD-112.9) 5)  Essential Hypertension, Benign  (ICD-401.1) 6)  Otitis Media, Serous, Bilateral  (ICD-381.4) 7)  Conjunctivitis, Allergic  (ICD-372.14) 8)  Edema  (ICD-782.3) 9)  Achilles Tendinitis  (ICD-726.71) 10)  Coccygeal Pain  (ICD-724.79) 11)  Palpitations  (ICD-785.1) 12)  Fatigue  (ICD-780.79) 13)  Metatarsalgia  (ICD-726.70) 14)  Knee Replacement, Left, Hx of  (ICD-V43.65) 15)  Dermatitis Herpetiformis  (ICD-694.0) 16)  Anemia, Constitutional Aplastic Nec  (ICD-284.09) 17)  Vaginitis, Atrophic  (ICD-627.3) 18)  Seborrheic Dermatitis, Nos  (ICD-690.10) 19)  Panic Attacks  (ICD-300.01) 20)  Obesity, Nos  (ICD-278.00) 21)  Gastroesophageal Reflux, No Esophagitis  (ICD-530.81) 22)  Cholelithiasis, Nos  (ICD-574.20) 23)  Arthralgia, Unspecified  (ICD-719.40)  Current Medications (verified): 1)  Xanax 0.5 Mg Tabs (Alprazolam) .... Take 1 Tablet By Mouth Three Times A Day 2)  Ultram 50 Mg Tabs (Tramadol Hcl) .... Take 1 Tablet By Mouth Once A Day 3)  Detrol 2 Mg  Tabs  (Tolterodine Tartrate) .... Take Two Tabs Daily 4)  Furosemide 20 Mg Tabs (Furosemide) .Marland Kitchen.. 1 By Mouth Once Daily 5)  Effexor Xr 150 Mg Xr24h-Cap (Venlafaxine Hcl) .Marland Kitchen.. 1 By Mouth Qd 6)  Ketoprofen Gel 20% .... Use Qid To Hands 7)  Cromolyn Sodium 4 % Soln (Cromolyn Sodium) .Marland Kitchen.. 1-2 Drops Each Eye Three Times A Day/qid As Needed 8)  Fluticasone Propionate 50 Mcg/act Susp (Fluticasone Propionate) .Marland Kitchen.. 1-2 Sprays Each Nostril Once Daily 9)  Nexium 20 Mg Cpdr (Esomeprazole Magnesium) .Marland Kitchen.. 1 By Mouth Once Daily 10)  Clotrimazole-Betamethasone 1-0.05 % Crea (Clotrimazole-Betamethasone) .... Apply To Affected Area Two Times A Day/tid Dispense One 60 G Tube 11)  Fluconazole 100 Mg Tabs (Fluconazole) .Marland Kitchen.. 1 By Mouth Q Week 12)  Benazepril Hcl 10 Mg Tabs (Benazepril Hcl) .Marland Kitchen.. 1 By Mouth Qd  Allergies: 1)  Claritin (Loratadine)  Physical Exam  General:  obese, NAD, pleasant   Shoulder/Elbow Exam  Motor:    Normal strength in the upper extremities.    Shoulder Exam:    Right:    Inspection:  Normal    Palpation:  Normal    Stability:  stable    Tenderness:  right bicipital groove    Swelling:  no    Erythema:  no    Range of Motion:       Flexion-Active: 165       Extension-Active: 25       Flexion-Passive: 165  Extension-Passive: 25       External Rotation : 20       Interior Rotation : T10    Left:    Inspection:  Normal    Palpation:  Normal    Stability:  stable    Tenderness:  no    Swelling:  no    Erythema:  no    Range of Motion:       Flexion-Active: 175       Extension-Active: 40       Flexion-Passive: 175       Extension-Passive: 40       External Rotation : 20       Interior Rotation : T6  Impingement Sign NEER:    Right negative; Left negative Impingement Sign HAWKINS:    Right positive; Left negative Apprehension Sign:    Right negative; Left negative Sulcus Sign:    Right negative; Left negative   Impression & Recommendations:  Problem # 1:   ROTATOR CUFF SYNDROME, RIGHT (ICD-726.10)  Still with significant amount of pain and limited ROM.  TheraBand exercises are helping would continue these.    [place] of this reveals difficulty in getting good imaging as soft tissue thickness exceeds 4 cms no sign of subscap tear or of supraspinatus tear there is bursal swelling and impingement on abduction of RT arm under [place] viz   cleanse with alcohol Topical analgesic spray : Ethyl choride Joint RT shoulder Approached in typical fashion with: posterior approach directed toward coracoid Completed with some dificulty we anled to bursal area at about 1.5 inches Meds:kenalog 40 + lidocaine 6 ccs Needle:3 inch spinal Aftercare instructions and Red flags advised  Orders: Joint Aspirate / Injection, Large ([address]10)  Problem # 2:  SHOULDER PAIN, RIGHT (ICD-719.41) Assessment: Unchanged  Her updated medication list for this problem includes:    Ultram 50 Mg Tabs (Tramadol hcl) .Marland Kitchen... Take 1 tablet by mouth once a day  ice x 3 days rest and then resume ROM on day 4  reck 1 month  Orders: Joint Aspirate / Injection, Large ([address]10)  Complete Medication List: 1)  Xanax 0.5 Mg Tabs (Alprazolam) .... Take 1 tablet by mouth three times a day 2)  Ultram 50 Mg Tabs (Tramadol hcl) .... Take 1 tablet by mouth once a day 3)  Detrol 2 Mg Tabs (Tolterodine tartrate) .... Take two tabs daily 4)  Furosemide 20 Mg Tabs (Furosemide) .Marland Kitchen.. 1 by mouth once daily 5)  Effexor Xr 150 Mg Xr24h-cap (Venlafaxine hcl) .Marland Kitchen.. 1 by mouth qd 6)  Ketoprofen Gel 20%  .... Use qid to hands 7)  Cromolyn Sodium 4 % Soln (Cromolyn sodium) .Marland Kitchen.. 1-2 drops each eye three times a day/qid as needed 8)  Fluticasone Propionate 50 Mcg/act Susp (Fluticasone propionate) .Marland Kitchen.. 1-2 sprays each nostril once daily 9)  Nexium 20 Mg Cpdr (Esomeprazole magnesium) .Marland Kitchen.. 1 by mouth once daily 10)  Clotrimazole-betamethasone 1-0.05 % Crea (Clotrimazole-betamethasone) .... Apply to affected area two  times a day/tid dispense one 60 g tube 11)  Fluconazole 100 Mg Tabs (Fluconazole) .Marland Kitchen.. 1 by mouth q week 12)  Benazepril Hcl 10 Mg Tabs (Benazepril hcl) .Marland Kitchen.. 1 by mouth qd

## 2010-03-20 NOTE — Progress Notes (Signed)
  Phone Note Outgoing Call   Summary of Call: DEAR WHITE TEAM I received request from her INSURANCE company [employer] to change her effexor to generic--they say it would save her $744 a year (?). Does she wan generic? Thanks!  Denny Levy MD  November 21, 2009 3:44 PM   Follow-up for Phone Call        Spoke with patient and she states she would rather stay on name brand due to being on generic for 3 months and not working for her Follow-up by: Jimmy Footman, CMA,  November 21, 2009 3:58 PM

## 2010-03-20 NOTE — Progress Notes (Signed)
Summary: Rx Req  Phone Note Refill Request Message from:  Patient  Refills Requested: Medication #1:  XANAX 0.5 MG TABS Take 1 tablet by mouth three times a day [facility].  Initial call taken by: Clydell Hakim,  October 04, 2009 2:33 PM  Follow-up for Phone Call        called [facility] outpatient pharmacy and gave rx per Dr. Richard Miu pt. and informed her of rx called in. Follow-up by: Jimmy Footman, CMA,  October 05, 2009 10:36 AM    Prescriptions: Prudy Feeler 0.5 MG TABS (ALPRAZOLAM) Take 1 tablet by mouth three times a day  #90 x 5   Entered and Authorized by:   Denny Levy MD   Signed by:   Denny Levy MD on 10/05/2009   Method used:   Telephoned to ...       [facility] [address] [phone]* (retail)       [address]       [place], [place]  [address]       Ph: [phone]       Fax: [phone]   RxID:   [id number]   DEAR WHITE TEAM please call in as above to [facility] outpt \\Thanks !  Denny Levy MD  October 05, 2009 10:22 AM

## 2010-03-20 NOTE — Progress Notes (Signed)
Summary: phn msg  Phone Note Call from Patient Call back at Home Phone [phone]   Caller: Patient Summary of Call: needs to talk to Dr neal about something confidential - will avail after 2pm today Initial call taken by: De Nurse,  October 04, 2009 12:13 PM  Follow-up for Phone Call        spoke w pt will refill meds as requested

## 2010-03-20 NOTE — Letter (Signed)
Summary: LAB Letter  [facility]  [address]   [place], [place] [address]   Phone: [phone]  Fax: [phone]    10/13/2009  [name] [address] [place], [place]  [address]  Dear Ms. Geurin,  All of your labs looked A OK.Liver, kidney, blood sugar, electrolytes all fine. Great to see you!         Sincerely,   Denny Levy MD  Appended Document: LAB Letter mailed

## 2010-03-20 NOTE — Assessment & Plan Note (Signed)
Summary: ORTHOTICS,MC   Vital Signs:  Patient profile:   60 year old female Height:      60 inches Weight:      217 pounds BMI:     42.53 BP sitting:   118 / 78   Primary Care Provider:  Enid Baas MD   History of Present Illness: B longstanding plantar fasciitis has been on her feet more since a change in her job--it is bothering her daily  Also has a lot of toe pain B as she has some deformnity.  Pain is aching and sometimes sharp in the bottom  of foot, toes feel cimpressed and "tight" at end of day.  Worse with standing or walking.  Preventive Screening-Counseling & Management  Alcohol-Tobacco     Smoking Status: never  Current Medications (verified): 1)  Xanax 0.5 Mg Tabs (Alprazolam) .... Take 1 Tablet By Mouth Three Times A Day 2)  Ultram 50 Mg Tabs (Tramadol Hcl) .... Take 1 Tablet By Mouth Once A Day 3)  Detrol 2 Mg  Tabs (Tolterodine Tartrate) .... Take Two Tabs Daily 4)  Furosemide 20 Mg Tabs (Furosemide) .Marland Kitchen.. 1 By Mouth Once Daily 5)  Effexor Xr 150 Mg Xr24h-Cap (Venlafaxine Hcl) .Marland Kitchen.. 1 By Mouth Once Daily Brand Name Please 6)  Cromolyn Sodium 4 % Soln (Cromolyn Sodium) .Marland Kitchen.. 1-2 Drops Each Eye Three Times A Day/qid As Needed 7)  Fluticasone Propionate 50 Mcg/act Susp (Fluticasone Propionate) .Marland Kitchen.. 1-2 Sprays Each Nostril Once Daily 8)  Nexium 20 Mg Cpdr (Esomeprazole Magnesium) .Marland Kitchen.. 1 By Mouth Once Daily 9)  Fluconazole 100 Mg Tabs (Fluconazole) .Marland Kitchen.. 1 By Mouth Q Week 10)  Benazepril Hcl 10 Mg Tabs (Benazepril Hcl) .Marland Kitchen.. 1 By Mouth Qd 11)  Diclofenac Sodium 75 Mg Tbec (Diclofenac Sodium) .Marland Kitchen.. 1 By Mouth Two Times A Day As Needed Joint Pain 12)  Valtrex 500 Mg Tabs (Valacyclovir Hcl) .Marland Kitchen.. 1 By Mouth Two Times A Day For Three Days At Each Outbreak  Allergies: 1)  Claritin (Loratadine)  Review of Systems  The patient denies anorexia, fever, weight loss, and weight gain.    Physical Exam  General:  alert, well-developed, well-nourished, well-hydrated, and  overweight-appearing.   Msk:  GAIT is normal stride length. Out toeing B.  Mild pronation B  FEET loss of transverse arch and longitudinal arch B. TTP plantar fascia B at origin.  Neurovascularly intact TOES esp on  right are becoming somewhat splayed and mis shapen due to transverse arch loss. Also hammer toe oon right #2. Additional Exam:  Patient was fitted for a : standard, cushioned, semi-rigid orthotic. The orthotic was heated and afterward the patient stood on the orthotic blank positioned on the orthotic stand. The patient was positioned in subtalar neutral position and 10 degrees of ankle dorsiflexion in a weight bearing stance. After completion of molding, a stable base was applied to the orthotic blank. The blank was ground to a stable position for weight bearing. White base with blue swirl blank very small neuroma pads used for beginning MT pads--she has not tolerated traditional MT pads inpast   Impression & Recommendations:  Problem # 1:  METATARSALGIA (ICD-726.70)  Orders: Orthotic Materials, each unit (Z6109)  Complete Medication List: 1)  Xanax 0.5 Mg Tabs (Alprazolam) .... Take 1 tablet by mouth three times a day 2)  Ultram 50 Mg Tabs (Tramadol hcl) .... Take 1 tablet by mouth once a day 3)  Detrol 2 Mg Tabs (Tolterodine tartrate) .... Take two tabs daily 4)  Furosemide 20 Mg Tabs (Furosemide) .Marland Kitchen.. 1 by mouth once daily 5)  Effexor Xr 150 Mg Xr24h-cap (Venlafaxine hcl) .Marland Kitchen.. 1 by mouth once daily brand name please 6)  Cromolyn Sodium 4 % Soln (Cromolyn sodium) .Marland Kitchen.. 1-2 drops each eye three times a day/qid as needed 7)  Fluticasone Propionate 50 Mcg/act Susp (Fluticasone propionate) .Marland Kitchen.. 1-2 sprays each nostril once daily 8)  Nexium 20 Mg Cpdr (Esomeprazole magnesium) .Marland Kitchen.. 1 by mouth once daily 9)  Fluconazole 100 Mg Tabs (Fluconazole) .Marland Kitchen.. 1 by mouth q week 10)  Benazepril Hcl 10 Mg Tabs (Benazepril hcl) .Marland Kitchen.. 1 by mouth qd 11)  Diclofenac Sodium 75 Mg Tbec (Diclofenac  sodium) .Marland Kitchen.. 1 by mouth two times a day as needed joint pain 12)  Valtrex 500 Mg Tabs (Valacyclovir hcl) .Marland Kitchen.. 1 by mouth two times a day for three days at each outbreak Prescriptions: VALTREX 500 MG TABS (VALACYCLOVIR HCL) 1 by mouth two times a day for three days at each outbreak  #6 x 4   Entered and Authorized by:   Denny Levy MD   Signed by:   Denny Levy MD on 01/01/2010   Method used:   Electronically to        [facility]* (retail)       [address]       [address]       [place], [place]  [address]       Ph: [phone]       Fax: [phone]   RxID:   [id number]    Orders Added: 1)  Est. Patient Level IV [25956] 2)  Orthotic Materials, each unit [L3002]

## 2010-03-20 NOTE — Assessment & Plan Note (Signed)
Summary: f/up,tcb   Vital Signs:  Patient profile:   60 year old female Weight:      215.7 pounds Temp:     98.6 degrees F oral Pulse rate:   77 / minute BP sitting:   116 / 79  (left arm) Cuff size:   large  Vitals Entered By: Loralee Pacas CMA (October 11, 2009 9:25 AM)  Primary Care Provider:  Enid Baas MD   History of Present Illness: Follow up hypertension. Taking medicines regularly with no problems. Not having any any headaches or chest pains.  2) increased stressors with increased anxiety. We had pped her benzodiazepine and that helped. She has resolved some of her stressors---stepping into different job--and is back down to original dose.  3) LE swelling after a long day on her feet. Causes her shoes to be ncoomfortable.  4) foot pain B. Especially after long day on her feet. Has had orthotics in past---not using them now.  Current Medications (verified): 1)  Xanax 0.5 Mg Tabs (Alprazolam) .... Take 1 Tablet By Mouth Three Times A Day 2)  Ultram 50 Mg Tabs (Tramadol Hcl) .... Take 1 Tablet By Mouth Once A Day 3)  Detrol 2 Mg  Tabs (Tolterodine Tartrate) .... Take Two Tabs Daily 4)  Furosemide 20 Mg Tabs (Furosemide) .Marland Kitchen.. 1 By Mouth Once Daily 5)  Effexor Xr 150 Mg Xr24h-Cap (Venlafaxine Hcl) .Marland Kitchen.. 1 By Mouth Once Daily Brand Name Please 6)  Cromolyn Sodium 4 % Soln (Cromolyn Sodium) .Marland Kitchen.. 1-2 Drops Each Eye Three Times A Day/qid As Needed 7)  Fluticasone Propionate 50 Mcg/act Susp (Fluticasone Propionate) .Marland Kitchen.. 1-2 Sprays Each Nostril Once Daily 8)  Nexium 20 Mg Cpdr (Esomeprazole Magnesium) .Marland Kitchen.. 1 By Mouth Once Daily 9)  Fluconazole 100 Mg Tabs (Fluconazole) .Marland Kitchen.. 1 By Mouth Q Week 10)  Benazepril Hcl 10 Mg Tabs (Benazepril Hcl) .Marland Kitchen.. 1 By Mouth Qd 11)  Diclofenac Sodium 75 Mg Tbec (Diclofenac Sodium) .Marland Kitchen.. 1 By Mouth Two Times A Day As Needed Joint Pain  Allergies: 1)  Claritin (Loratadine)  Review of Systems Psych:  Complains of panic attacks; denies easily angered,  easily tearful, irritability, suicidal thoughts/plans, and thoughts of violence.  Physical Exam  General:  alert and well-developed.   Neck:  supple, full ROM, no masses, no thyromegaly, and no carotid bruits.   Lungs:  normal respiratory effort and normal breath sounds.   Heart:  normal rate, regular rhythm, and no murmur.   Abdomen:  soft and normal bowel sounds.   Msk:  rigt foot has torsion of third planage. Loss of transverse arch B. No lesions on feet. Beginnig bunion right neurovascualrly intact Pulses:  DP 2+ b= Extremities:  slight non pitting edema feet.    Impression & Recommendations:  Problem # 1:  ESSENTIAL HYPERTENSION, BENIGN (ICD-401.1)  Her updated medication list for this problem includes:    Furosemide 20 Mg Tabs (Furosemide) .Marland Kitchen... 1 by mouth once daily    Benazepril Hcl 10 Mg Tabs (Benazepril hcl) .Marland Kitchen... 1 by mouth qd  Orders: Comp [facility] ([address]) [facility]- Est  Level 4 ([address]11) no med changes  doing well  Problem # 2:  PANIC ATTACKS (ICD-300.01)  Her updated medication list for this problem includes:    Xanax 0.5 Mg Tabs (Alprazolam) .Marland Kitchen... Take 1 tablet by mouth three times a day    Effexor Xr 150 Mg Xr24h-cap (Venlafaxine hcl) .Marland Kitchen... 1 by mouth once daily brand name please  Orders: [facility]- Est  Level 4 ([address]1478) f/u  3 m  Problem # 3:  ARTHRALGIA, UNSPECIFIED (ICD-719.40) some pain in feet--partly related to soft tissue swelling / edema after standing. Rec supoport hose and info given will try diclofenac for pain  Complete Medication List: 1)  Xanax 0.5 Mg Tabs (Alprazolam) .... Take 1 tablet by mouth three times a day 2)  Ultram 50 Mg Tabs (Tramadol hcl) .... Take 1 tablet by mouth once a day 3)  Detrol 2 Mg Tabs (Tolterodine tartrate) .... Take two tabs daily 4)  Furosemide 20 Mg Tabs (Furosemide) .Marland Kitchen.. 1 by mouth once daily 5)  Effexor Xr 150 Mg Xr24h-cap (Venlafaxine hcl) .Marland Kitchen.. 1 by mouth once daily brand name please 6)  Cromolyn Sodium 4 % Soln  (Cromolyn sodium) .Marland Kitchen.. 1-2 drops each eye three times a day/qid as needed 7)  Fluticasone Propionate 50 Mcg/act Susp (Fluticasone propionate) .Marland Kitchen.. 1-2 sprays each nostril once daily 8)  Nexium 20 Mg Cpdr (Esomeprazole magnesium) .Marland Kitchen.. 1 by mouth once daily 9)  Fluconazole 100 Mg Tabs (Fluconazole) .Marland Kitchen.. 1 by mouth q week 10)  Benazepril Hcl 10 Mg Tabs (Benazepril hcl) .Marland Kitchen.. 1 by mouth qd 11)  Diclofenac Sodium 75 Mg Tbec (Diclofenac sodium) .Marland Kitchen.. 1 by mouth two times a day as needed joint pain  Patient Instructions: 1)  I will send you a letter about your lab workl. 2)  Let's try the diclofenac for your joint pains. 3)  Let me recheck your liver functins in 6 weeks--no fasting required--just come for lab only. 4)  [email or web address] is where I got my support hose. 5)  Let me know if you want some new orthotics made--just call [phone] and tell Shawna Orleans you need to see me for some orthotics. 6)  Great to see you! Prescriptions: DICLOFENAC SODIUM 75 MG TBEC (DICLOFENAC SODIUM) 1 by mouth two times a day as needed joint pain  #180 x 3   Entered and Authorized by:   Denny Levy MD   Signed by:   Denny Levy MD on 10/11/2009   Method used:   Electronically to        [facility]* (retail)       [address]       [address] Shipping/mailing       [place], [place]  [address]       Ph: [phone]       Fax: [phone]   RxID:   [id number]   Prevention & Chronic Care Immunizations   Influenza vaccine: given  (10/20/2007)   Influenza vaccine due: 10/19/2008    Tetanus booster: 04/18/2005: given   Tetanus booster due: 04/19/2015    Pneumococcal vaccine: Not documented  Colorectal Screening   Hemoccult: not indicated  (05/03/2008)   Hemoccult due: Not Indicated    Colonoscopy: normal  (07/28/2007)   Colonoscopy due: 07/27/2017  Other Screening   Pap smear: s/p TAHBSO not indicated  (05/03/2008)   Pap smear due: Not Indicated    Mammogram: ASSESSMENT:  Negative - BI-RADS 1^MM DIGITAL SCREENING  (08/24/2009)   Mammogram due: 06/30/2008   Smoking status: never  (04/05/2009)  Lipids   Total Cholesterol: 192  (04/05/2009)   LDL: 113  (04/05/2009)   LDL Direct: 94  (04/27/2008)   HDL: 57  (04/05/2009)   Triglycerides: 110  (04/05/2009)  Hypertension   Last Blood Pressure: 116 / 79  (10/11/2009)   Serum creatinine: 0.63  (04/05/2009)   Serum potassium 4.5  (04/05/2009) CMP ordered     Hypertension flowsheet reviewed?: Yes  Progress toward BP goal: At goal  Self-Management Support :    Hypertension self-management support: Not documented

## 2010-03-20 NOTE — Progress Notes (Signed)
Summary: refill  Phone Note Refill Request Call back at Home Phone [phone] Call back at Work Phone [phone] Message from:  Patient  Refills Requested: Medication #1:  EFFEXOR XR 150 MG XR24H-CAP 1 by mouth qd   Notes: needs to be name brand only [facility]  Initial call taken by: De Nurse,  February 20, 2009 12:13 PM Caller: Patient Initial call taken by: De Nurse,  February 20, 2009 12:12 PM  Follow-up for Phone Call        LVM that rx was sent to pharmacy. Follow-up by: Clydell Hakim,  February 21, 2009 11:06 AM    Prescriptions: EFFEXOR XR 150 MG XR24H-CAP (VENLAFAXINE HCL) 1 by mouth qd  #90 x 3   Entered and Authorized by:   Paula Compton MD   Signed by:   Paula Compton MD on 02/20/2009   Method used:   Electronically to        [facility]* (retail)       [address]       [address] Shipping/mailing       [place], [place]  [address]       Ph: [phone]       Fax: [phone]   RxID:   [id number]

## 2010-03-20 NOTE — Assessment & Plan Note (Signed)
Summary: cpe,df   Vital Signs:  Patient profile:   60 year old female Height:      60 inches Weight:      222.8 pounds BMI:     43.67 Temp:     98.1 degrees F oral Pulse rate:   90 / minute BP sitting:   134 / 83  (left arm) Cuff size:   large  Vitals Entered By: Gladstone Pih (April 05, 2009 8:34 AM) CC: CPE, Hypertension Management Is Patient Diabetic? No Pain Assessment Patient in pain? no        Primary Care Provider:  Enid Baas MD  CC:  CPE and Hypertension Management.  History of Present Illness: here for CPE  1) very fatigued recently. Has put on a few pounds. Also a lot of stress at work. Is emotionally much more labile in last 2-3 weeks since stressors increased.  Joints seem to be hurting more  Has several skin lesions and one area on her lags that is a rash.  Not getting any exercise. Feels a little like some of her signs might be related to her depression and anxiety disorders but wants to rule out anything "medical" first.  Denies SI/HI. Feels angry a lot of the time. Not sleeping well. Appetite unchanged. Difficulty focusing and difficulty completing tasks at times because she becomes fatigued mentally. More SOB with every day activities, no chest pain or dizziness with exertion. She had an EKG done and brings a copy with her.  Hypertension History:      Positive major cardiovascular risk factors include female age 70 years old or older and hypertension.  Negative major cardiovascular risk factors include non-tobacco-user status.    Habits & Providers  Alcohol-Tobacco-Diet     Tobacco Status: never  Allergies: 1)  Claritin (Loratadine)  Past History:  Past Medical History: Last updated: 07-20-07 ANA 1:40,  Anemia history  chest pain admit 2/ 2002 fibroids  Low B12 levels prob sec gastric surg  metatarsalgia dermatitis herpetiformis 2008 from bypass Issues with fatigue - question of anemia  Past Surgical History: Last updated:  2007/07/20 Cholecystectomy - 04/18/2000 Dr Jamey Ripa  gastric bypass 1979 Dr Sharyl Nimrod  Hysterectomy & BSO - 09/19/1998 sr stringer  neg cardiolite 2/ 2002   knee replacement 2001 by dr Cleophas Dunker and revised in 2005 Knee replacement of old components by Dr Despina Hick Jan 2009 Hernia repair 2004 - dr Johna Sheriff  CS jan 84 and tubal dec 82 - dr Aldona Bar  Family History: Last updated: July 20, 2007 father died at age 62 DVT to PE prob  mother died suicide age 38  sister 63 HBP  Grandparent had arthritis/ health hx not really known  Social History: Last updated: 07-20-07 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;  non smoker;  rare any etoh;  graduate of [employer]  Boneta Lucks daughter is 39 and taking Patent examiner  Physical Exam  General:  alert and overweight-appearing.   Eyes:  pupils equal, pupils round, and pupils reactive to light.   Ears:  R ear normal and no external deformities.   Mouth:  pharynx pink and moist.   Neck:  supple, full ROM, no masses, no thyromegaly, and no carotid bruits.   Breasts:  No mass, nodules, thickening, tenderness, bulging, retraction, inflamation, nipple discharge or skin changes noted.   Lungs:  normal respiratory effort, normal breath sounds, and no wheezes.   Heart:  normal rate, regular rhythm, no murmur, and no gallop.   Abdomen:  soft, non-tender, and normal bowel sounds.  Genitalia:  deferred by pt Msk:  normal ROM, no joint tenderness, no joint swelling, and no redness over joints.   Extremities:  No clubbing, cyanosis, edema, or deformity noted with normal full range of motion of all joints.   Neurologic:  alert & oriented X3 and gait normal.   Skin:  mottling on medial thighs (sort of looks vascular) some white 'bumps' on nasolabial bridge.   Impression & Recommendations:  Problem # 1:  FATIGUE (ICD-780.79)  sounds related to depression she is continuing the effexor---has switched to name brand) Orders: [facility] - Est  40-64 yrs [phone])  Problem # 2:  ESSENTIAL HYPERTENSION,  BENIGN (ICD-401.1) Orders: Comp Met-[facility] ([id number]) Lipid-[facility] ([id number]) [facility] - Est  40-64 yrs ([id number])well controlled  Problem # 3:  Preventive Health Care (ICD-V70.0)  mammogram due in May s/p TAHBSO labs today discussed exercise Orders: [facility] - Est  40-64 yrs ([id number])  Complete Medication List: 1)  Xanax 0.5 Mg Tabs (Alprazolam) .... Take 1 tablet by mouth three times a day 2)  Ultram 50 Mg Tabs (Tramadol hcl) .... Take 1 tablet by mouth once a day 3)  Detrol 2 Mg Tabs (Tolterodine tartrate) .... Take two tabs daily 4)  Furosemide 20 Mg Tabs (Furosemide) .Marland Kitchen.. 1 by mouth once daily 5)  Effexor Xr 150 Mg Xr24h-cap (Venlafaxine hcl) .Marland Kitchen.. 1 by mouth once daily brand name please 6)  Cromolyn Sodium 4 % Soln (Cromolyn sodium) .Marland Kitchen.. 1-2 drops each eye three times a day/qid as needed 7)  Fluticasone Propionate 50 Mcg/act Susp (Fluticasone propionate) .Marland Kitchen.. 1-2 sprays each nostril once daily 8)  Nexium 20 Mg Cpdr (Esomeprazole magnesium) .Marland Kitchen.. 1 by mouth once daily 9)  Fluconazole 100 Mg Tabs (Fluconazole) .Marland Kitchen.. 1 by mouth q week Comp Met-[facility] [phone]) Lipid-[facility] ([id number]) CBC-[facility] (02725) TSH-[facility] ([id number]) Dermatology Referral (Derma) [facility] - Est  40-64 yrs [phone])  Hypertension Assessment/Plan:      The patient's hypertensive risk group is category B: At least one risk factor (excluding diabetes) with no target organ damage.  Today's blood pressure is 134/83.      Impression & Recommendations:   Orders: [facility] - Est  40-64 yrs ([id number])    The following medications were removed from the medication list:    Benazepril Hcl 10 Mg Tabs (Benazepril hcl) .Marland Kitchen... 1 by mouth qd  Her updated medication list for this problem includes:    Furosemide 20 Mg Tabs (Furosemide) .Marland Kitchen... 1 by mouth once daily  Orders: Comp Met[facility] [phone]) Lipid-[facility] [phone])   Orders: Comp Met-[facility] ([id number]) Lipid-[facility] ([id number]) [facility] - Est  40-64 yrs ([id number])     Orders: [facility] - Est   40-64 yrs ([id number])    Complete Medication List: 1)  Xanax 0.5 Mg Tabs (Alprazolam) .... Take 1 tablet by mouth three times a day 2)  Ultram 50 Mg Tabs (Tramadol hcl) .... Take 1 tablet by mouth once a day 3)  Detrol 2 Mg Tabs (Tolterodine tartrate) .... Take two tabs daily 4)  Furosemide 20 Mg Tabs (Furosemide) .Marland Kitchen.. 1 by mouth once daily 5)  Effexor Xr 150 Mg Xr24h-cap (Venlafaxine hcl) .Marland Kitchen.. 1 by mouth once daily brand name please 6)  Cromolyn Sodium 4 % Soln (Cromolyn sodium) .Marland Kitchen.. 1-2 drops each eye three times a day/qid as needed 7)  Fluticasone Propionate 50 Mcg/act Susp (Fluticasone propionate) .Marland Kitchen.. 1-2 sprays each nostril once daily 8)  Nexium 20 Mg Cpdr (Esomeprazole magnesium) .Marland Kitchen.. 1 by mouth once daily 9)  Fluconazole 100 Mg Tabs (Fluconazole) .Marland Kitchen.. 1 by  mouth q week  Other Orders: CBC-[facility] ([id number]) TSH-[facility] ([id number]) Dermatology Referral (Derma)  Hypertension Assessment/Plan:      The patient's hypertensive risk group is category B: At least one risk factor (excluding diabetes) with no target organ damage.  Today's blood pressure is 134/83.     Prevention & Chronic Care Immunizations   Influenza vaccine: given  (10/20/2007)   Influenza vaccine due: 10/19/2008    Tetanus booster: 04/18/2005: given   Tetanus booster due: 04/19/2015    Pneumococcal vaccine: Not documented  Colorectal Screening   Hemoccult: not indicated  (05/03/2008)   Hemoccult due: Not Indicated    Colonoscopy: normal  (07/28/2007)   Colonoscopy due: 07/27/2017  Other Screening   Pap smear: s/p TAHBSO not indicated  (05/03/2008)   Pap smear due: Not Indicated    Mammogram: ASSESSMENT: Negative - BI-RADS 1^MM DIGITAL SCREENING  (07/01/2008)   Mammogram due: 06/30/2008   Smoking status: never  (04/05/2009)  Lipids   Total Cholesterol: Not documented   LDL: 94  (04/27/2008)   LDL Direct: 94  (04/27/2008)   HDL: Not documented   Triglycerides: Not documented  Hypertension   Last Blood  Pressure: 134 / 83  (04/05/2009)   Serum creatinine: 0.57  (07/27/2008)   Serum potassium 4.8  (07/27/2008) CMP ordered     Hypertension flowsheet reviewed?: Yes   Progress toward BP goal: At goal  Self-Management Support :    Hypertension self-management support: Not documented

## 2010-04-30 ENCOUNTER — Telehealth: Payer: Self-pay | Admitting: Family Medicine

## 2010-04-30 ENCOUNTER — Other Ambulatory Visit: Payer: Self-pay | Admitting: Family Medicine

## 2010-04-30 NOTE — Telephone Encounter (Signed)
Called Rx into pharmacy and notified pt.[name]

## 2010-04-30 NOTE — Telephone Encounter (Signed)
Dear [name] Team Can u please call in her rx as above THANKS! Zachariah Pavek

## 2010-04-30 NOTE — Telephone Encounter (Signed)
Refill request

## 2010-05-08 ENCOUNTER — Encounter: Payer: Self-pay | Admitting: Family Medicine

## 2010-05-17 NOTE — Miscellaneous (Signed)
Clinical Lists Changes  Observations: Added new observation of DM PROGRESS: N/A (05/08/2010 16:30) Added new observation of DM FSREVIEW: N/A (05/08/2010 16:30) Added new observation of LIPID PROGRS: N/A (05/08/2010 16:30) Added new observation of LIPID FSREVW: N/A (05/08/2010 16:30)      Complete Medication List: 1)  Xanax 0.5 Mg Tabs (Alprazolam) .... Take 1 tablet by mouth three times a day 2)  Ultram 50 Mg Tabs (Tramadol hcl) .... Take 1 tablet by mouth once a day 3)  Detrol 2 Mg Tabs (Tolterodine tartrate) .... Take two tabs daily 4)  Furosemide 20 Mg Tabs (Furosemide) .Marland Kitchen.. 1 by mouth once daily 5)  Effexor Xr 150 Mg Xr24h-cap (Venlafaxine hcl) .Marland Kitchen.. 1 by mouth once daily brand name please 6)  Cromolyn Sodium 4 % Soln (Cromolyn sodium) .Marland Kitchen.. 1-2 drops each eye three times a day/qid as needed 7)  Fluticasone Propionate 50 Mcg/act Susp (Fluticasone propionate) .Marland Kitchen.. 1-2 sprays each nostril once daily 8)  Nexium 20 Mg Cpdr (Esomeprazole magnesium) .Marland Kitchen.. 1 by mouth once daily 9)  Fluconazole 100 Mg Tabs (Fluconazole) .Marland Kitchen.. 1 by mouth q week 10)  Benazepril Hcl 10 Mg Tabs (Benazepril hcl) .Marland Kitchen.. 1 by mouth qd 11)  Diclofenac Sodium 75 Mg Tbec (Diclofenac sodium) .Marland Kitchen.. 1 by mouth two times a day as needed joint pain 12)  Valtrex 500 Mg Tabs (Valacyclovir hcl) .Marland Kitchen.. 1 by mouth two times a day for three days at each outbreak   Past History:  Past Medical History: Last updated: 08/01/2007 ANA 1:40,  Anemia history  chest pain admit 2/ 2002 fibroids  Low B12 levels prob sec gastric surg  metatarsalgia dermatitis herpetiformis 2008 from bypass Issues with fatigue - question of anemia  Past Surgical History: Last updated: Aug 01, 2007 Cholecystectomy - 04/18/2000 Dr Jamey Ripa  gastric bypass 1979 Dr Sharyl Nimrod  Hysterectomy & BSO - 09/19/1998 sr stringer  neg cardiolite 2/ 2002   knee replacement 2001 by dr Cleophas Dunker and revised in 2005 Knee replacement of old components by Dr Despina Hick Jan  2009 Hernia repair 2004 - dr Johna Sheriff  CS jan 84 and tubal dec 82 - dr Aldona Bar  Family History: Last updated: 2007/08/01 father died at age 57 DVT to PE prob  mother died suicide age 51  sister 104 HBP  Grandparent had arthritis/ health hx not really known  Social History: Last updated: 2007/08/01 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;  non smoker;  rare any etoh;  graduate of [employer]  Boneta Lucks daughter is 75 and taking law enforcement    Prevention & Chronic Care Immunizations   Influenza vaccine: given  (10/20/2007)   Influenza vaccine due: 10/19/2008    Tetanus booster: 04/18/2005: given   Tetanus booster due: 04/19/2015    Pneumococcal vaccine: Not documented  Colorectal Screening   Hemoccult: not indicated  (05/03/2008)   Hemoccult due: Not Indicated    Colonoscopy: normal  (07/28/2007)   Colonoscopy due: 07/27/2017  Other Screening   Pap smear: s/p TAHBSO not indicated  (05/03/2008)   Pap smear due: Not Indicated    Mammogram: ASSESSMENT: Negative - BI-RADS 1^MM DIGITAL SCREENING  (08/24/2009)   Mammogram due: 06/30/2008   Smoking status: never  (01/01/2010)  Lipids   Total Cholesterol: 192  (04/05/2009)   LDL: 113  (04/05/2009)   LDL Direct: 94  (04/27/2008)   HDL: 57  (04/05/2009)   Triglycerides: 110  (04/05/2009)  Hypertension   Last Blood Pressure: 118 / 78  (01/01/2010)   Serum creatinine: 0.59  (10/11/2009)   Serum potassium 4.7  (  10/11/2009)  Self-Management Support :    Hypertension self-management support: Not documented

## 2010-05-30 ENCOUNTER — Other Ambulatory Visit: Payer: Self-pay | Admitting: Family Medicine

## 2010-06-11 ENCOUNTER — Telehealth: Payer: Self-pay | Admitting: Family Medicine

## 2010-06-11 NOTE — Telephone Encounter (Signed)
Pt says she has been trying to get her nexium refilled since the 1st week of April, goes to [facility].

## 2010-06-18 MED ORDER — ESOMEPRAZOLE MAGNESIUM 20 MG PO CPDR: 20.0000 mg | DELAYED_RELEASE_CAPSULE | Freq: Every day | ORAL | Status: DC

## 2010-06-18 NOTE — Telephone Encounter (Signed)
Dear [name] Team OK---I had something PENDING and the refill did not go as planned. Tell her I THINK I have fixed this and if not please call me back THANKS! [name]

## 2010-06-22 ENCOUNTER — Ambulatory Visit: Payer: Self-pay | Admitting: Family Medicine

## 2010-06-25 ENCOUNTER — Encounter: Payer: Self-pay | Admitting: Family Medicine

## 2010-07-03 NOTE — Op Note (Signed)
[name], [name]               ACCOUNT NO.:  [id number]   MEDICAL RECORD NO.:  [id number]          PATIENT TYPE:  INP   LOCATION:  1608                         FACILITY:  [facility]   PHYSICIAN:  Ollen Gross, M.D.    DATE OF BIRTH:  Sep 27, 1950   DATE OF PROCEDURE:  03/02/2007  DATE OF DISCHARGE:                               OPERATIVE REPORT   PREOPERATIVE DIAGNOSIS:  Painful loose left total knee arthroplasty.   POSTOPERATIVE DIAGNOSIS:  Painful loose left total knee arthroplasty.   PROCEDURE:  Left total knee arthroplasty revision.   SURGEON:  Ollen Gross, M.D.   ASSISTANT:  Avel Peace PA-C   ANESTHESIA:  General with postop Marcaine pain pump.   ESTIMATED BLOOD LOSS:  Minimal.   DRAINS:  Hemovac times one.   COMPLICATIONS:  None.   TOURNIQUET TIME:  Up 46 minutes at 300 mmHg then down 8 minutes and up  an additional 26 minutes at 300 mmHg.   COMPLICATIONS:  None.   CONDITION:  Stable to recovery.   BRIEF CLINICAL NOTE:  [name] is a 60 year old female who has had two  prior total knee arthroplasties.  Last was a revision about 3-4 years  ago.  She has gone on to develop significant pretibial pain.  This  corresponds to an area at the tip of this tibial stem.  In addition she  had some instability symptoms.  She has developed a pedestal of bone at  the tip of her stem.  It is felt that this represents loosening of the  stem.  She has had worsening pain and presents for total knee  arthroplasty revision.   PROCEDURE IN DETAIL:  After successful initiation of general anesthetic  a tourniquet placed on the left thigh.  Exam under anesthesia was  performed and she has moderate varus and valgus laxity in full extension  and some anterior posterior laxity in flexion.  The leg was then prepped  and draped in the usual sterile fashion.  Extremity was wrapped in  Esmarch, knee flexed and tourniquet inflated 300 mmHg.  Midline incision  is made with a 10 blade through  subcutaneous tissue to the level of the  extensor mechanism.  A fresh blade is used make a medial parapatellar  arthrotomy.  We did not encounter any fluid in the joint.  Soft tissue  over the proximal medial tibia is subperiosteally elevated to the joint  line with the knife and around the semimembranosus bursa with a Cobb  elevator.  Soft tissue laterally is elevated with attention being paid  to avoiding patellar tendon on tibial tubercle.  The patella subluxed  laterally.  The knee flexed 90 degrees.  I then removed the tibial  polyethylene by subluxed the tibia forward and removing the tibial spine  to get the polyethylene out.  We then disrupted the interface between  the femoral component and bone with osteotomes and removed the femoral  component with minimal bone loss.  The tibial guide  I just placed an  osteotome up under the tibia and we were able to remove the tibia very  easily  consistent with the component being loose.  This was only  cemented proximally and press fit distally.  All of the cement came out  with the component suggesting that there was no bond between cement and  bone.  I cleaned any the sclerotic bone out of the proximal canal and  then irrigated both the tibial and femoral canals.  We passed the  starter reamer into the tibial canal and met the pedestal down in the  canal.  We used the drill to drill through the pedestal and then was  able to ream up to 13 mm on tibial side and two 18 mm of the femoral  side.   The 13 mm reamer was placed in the intramedullary cutting guide placed.  We had the proximal tibial resection with an oscillating saw giving a  nice flat surface.  Then, the size two is the most appropriate tibial  component and the proximal reaming was then performed for size 2 with a  13 x 30 stem extension.  After the tibial preparation is completed then  we addressed the femur.  The 18 mm reamer was passed to the femur and  then the 5 degrees  left valgus alignment guide is placed.  The block is  pinned to remove about 2 mm from the femur.  On the medial side we had  of a minimal amount of bone resection, on the lateral side had to go up  an additional 4 mm in order to get any bone resection.  Her joint line  was elevated on her preop x-rays and I decided to add augments distally  to build the joint line back down.  We placed 4 mm medial and 8 mm  lateral in order to build it down appropriately.  The AP cutting block  is then placed for size 2.5 femur.  We had the 4 mm augment distally on  the medial side, 8 mm augment laterally on the distal aspect of femur.  The stem was placed in the +2 position to effectively raise the stem and  lower the femoral flange.  The anterior-posterior and chamfer cuts were  made.  In order to help lower the component and decreased the size of  flexion gap, I placed 4 mm posterior augments medially and laterally.  We then completed the femoral preparation with intercondylar cut for the  size 2.5 PC3 component.   The trials were placed on the tibial side.  Once again, size two tibia  with a 13 x 30 stem extension.  This was MBT revision tray.  I placed a  10-mm augment medially and laterally to help get back to the normal  joint line.  This got [place] to the level of the tip of the fibula.  The  femoral component is a size 2.5 TC3 femur with a 18 x 75 stem extension  in the +2 position with a 4 mm posterior augment medial and lateral and  then a 4 mm distal augment medial and an 8 mm augment lateral distal.  We had excellent fit with both components.  A 15 mm TC3 rotating  platform insert led to full extension with excellent varus and valgus  anterior and posterior balance throughout full range of motion.  I then  removed the LCS patellar component and placed a template for a 38 Sigma  RP all polyethylene patella.  Lug holes were drilled.  The 38 patella  was placed and it tracks normally.   We then  released the tourniquet for a initial tourniquet time of 46  minutes.  Moist sponge is placed and minor bleeding stopped with  cautery.  I let the tourniquet down for 8 minutes, the components were  assembled on the back table.  The same sizes and the same augments as  found on the trials.  After 8 minutes we rewrapped the leg in Esmarch  and reinflated the tourniquet to 300 mmHg.  The trials removed.  We  sized for the cement restrictor in tibial canal and size 4 was most  appropriate.  Size 4 restrictor is placed at the appropriate depth in  the tibial canal.  The cut bone surface was then prepared with pulsatile  lavage.  Cement was mixed.  Once ready for implantation the cement was  injected into the tibial canal and pressurized.  The tibial component  was then cemented into place.  Once again this is a size 2 MBT revision  tibia with the 13 x 30 stem extension and 10 mm medial and lateral  augments.  It is impacted into the right position and all extruded  cement removed.  On the femoral side we just cemented distally and press  fit the stem.  Once again we had a size 2.5 TC3 femur with 4 mm  posterior augments medial and lateral and with an 8 mm distal augment  laterally and a 4 mm distal augment medially.  It was an 18 x 75 mm stem  extension in the +2 position.  I cemented it down distally and impacted  it.  All extruded cement was removed.  15 mm trial insert is placed and  knee held in full extension.  Any further extruded cement was removed.  Once cement fully hardened then the wound was copiously irrigated saline  solution and permanent 15 mm TC3 RP insert was placed.  The 38 patella  had been cemented in place and held with a clamp.  Once cement fully  hardened, the clamp was removed.  Wound is again irrigated and then the  permanent 15 mm TC3 RP insert is placed in the tibial tray.  The knee  was reduced and then the arthrotomy closed over Hemovac drain with  interrupted #1  Ethibond.  Flexion  against gravity about 130 degrees.  Subcu was closed interrupted 2-0  Ethibond and skin with staples.  Catheter for Marcaine pain pump is  placed and the pump initiated.  Bulky sterile dressings applied.  Drains  hooked to suction and she is placed into a knee immobilizer, awakened  and transferred to recovery in stable condition.      Ollen Gross, M.D.  Electronically Signed     FA/MEDQ  D:  03/02/2007  T:  03/03/2007  Job:  [id number]

## 2010-07-03 NOTE — Op Note (Signed)
[name], [name]               ACCOUNT NO.:  [id number]   MEDICAL RECORD NO.:  [id number]          PATIENT TYPE:  AMB   LOCATION:  ENDO                         FACILITY:  [facility]   PHYSICIAN:  Bernette Redbird, M.D.   DATE OF BIRTH:  [birth year]   DATE OF PROCEDURE:  07/28/2007  DATE OF DISCHARGE:                               OPERATIVE REPORT   PROCEDURE:  Colonoscopy.   INDICATIONS:  A 60 year old female with occasional rectal bleeding and  need for updated colon cancer screening since her last colonoscopy, 6  years ago, had a suboptimal prep.   FINDINGS:  Normal exam to the cecum.   DESCRIPTION OF PROCEDURE:  The nature, purpose and risks of the  procedure were familiar to the patient from prior examination.  She  provided written consent.  Total sedation for this procedure and the  upper endoscopy which preceded it was fentanyl 125 mcg and Versed 14 mg  IV without arrhythmias or desaturation.  The Pentax pediatric video  colonoscope was advanced without significant difficulty to the cecum as  identified by visualization of the appendiceal orifice and pullback was  then performed.   The quality of prep was excellent.  It is felt that all areas were well  seen.   This was normal examination.  No polyps, cancer, colitis, vascular  malformations or diverticulosis were noted.  Retroflexion in the rectum  and reinspection of the rectum were unremarkable.  No biopsies were  obtained.  The patient tolerated the procedure well and there no  apparent complications.   IMPRESSION:  Normal screening colonoscopy in a standard risk  individual.   PLAN:  Repeat colonoscopy in 10 years.           ______________________________  Bernette Redbird, M.D.     RB/MEDQ  D:  07/28/2007  T:  07/28/2007  Job:  [id number]   cc:   Royal Hawthorn B. Darrick Penna, M.D.

## 2010-07-03 NOTE — Op Note (Signed)
NAMEGUSTAVO, MEDITZ               ACCOUNT NO.:  [id number]   MEDICAL RECORD NO.:  [id number]          PATIENT TYPE:  AMB   LOCATION:  ENDO                         FACILITY:  [facility]   PHYSICIAN:  Bernette Redbird, M.D.   DATE OF BIRTH:  1950-11-09   DATE OF PROCEDURE:  07/28/2007  DATE OF DISCHARGE:                               OPERATIVE REPORT   PROCEDURE:  Upper endoscopy with biopsies.   INDICATION:  A 60 year old female status post gastric bypass procedure,  who some time ago had a rash on her torso, thought to be compatible with  dermatitis herpetiformis, and it went away after going on a gluten-  restricted diet for about 6 weeks.  Since then, the patient has remained  on a partial gluten-restriction without recurrence of the rash.  Celiac  antibodies are negative.  This is being done to sample the small bowel  mucosa and help verify the presence or absence of celiac disease  histologically, keeping in mind that for the past month or so since I  saw her in the office, the patient has intentionally not restricted her  gluten intake so as to allow any consequent histologic abnormalities to  become manifested.   FINDINGS:  Normal postop changes.  No endoscopic findings to suggest  celiac disease.   DESCRIPTION OF PROCEDURE:  The nature, purpose and risks of the  procedure were familiar to the patient from prior examination and she  provided written consent.  Sedation was fentanyl 75 mcg and Versed 7 mg  IV without arrhythmias or desaturation.  The Pentax video endoscope was  passed under direct vision.  The larynx looked normal.  The esophagus  was readily entered and was normal in its entirety.  No reflux  esophagitis, Barrett's esophagus, varices, infection, neoplasia, ring  stricture or significant hiatal hernia were appreciated.   The patient had a small gastric remnant, which is consistent with her  prior history of gastric bypass surgery.  We rapidly entered the small  bowel through a widely patent anastomosis and followed the bowel a good  distance, to the area where the Roux-en-Y limb met the small bowel.  Multiple (approximately 10) biopsies were obtained from various sections  of the small bowel during pullback.   The patient tolerated the procedure well and there no apparent  complications.   IMPRESSION:  1. Prior history of possible dermatitis herpetiformis, but with      negative celiac antibodies, pathology pending on small bowel      biopsies.  2. Normal endoscopic appearance, status post gastric bypass surgery.   PLAN:  Await pathology results.          ______________________________  Bernette Redbird, M.D.    RB/MEDQ  D:  07/28/2007  T:  07/28/2007  Job:  [id number]   cc:   Sibyl Parr. Darrick Penna, M.D.

## 2010-07-03 NOTE — H&P (Signed)
[name], [name]               ACCOUNT NO.:  [id number]   MEDICAL RECORD NO.:  [id number]          PATIENT TYPE:  INP   LOCATION:  0007                         FACILITY:  [facility]   PHYSICIAN:  Ollen Gross, M.D.    DATE OF BIRTH:  1950-07-01   DATE OF ADMISSION:  03/02/2007  DATE OF DISCHARGE:                              HISTORY & PHYSICAL   DATE OF OFFICE VISIT AND HISTORY AND PHYSICAL:  February 10, 2007   CHIEF COMPLAINT:  Left knee pain.   HISTORY OF PRESENT ILLNESS:  The patient is a 60 year old female who has  been seen by Dr. Lequita Halt in second opinion for ongoing left knee pain.  She originally had a total knee done in 2001 and had early loosening.  She did well for approximately a year or two and then progressively got  worse, and had a revision done in 2005.  She has had a bone scan done  since then, and told that the bone scan was very hot.  She has been seen  in the office, where x-rays show progressive changes, showing a pedestal  and some loosening around the tip of the stem of the tibia.  There were  concerns that she has loosening, and felt to be more of a mechanical  nature.  It is felt that she would best be served by undergoing  revision.  Risks and benefits discussed.  The patient is subsequently  admitted to the hospital.   ALLERGIES:  VIOXX.   INTOLERANCES:  NO DISSOLVABLE STITCHES.   CURRENT MEDICATIONS:  Detrol, triamterene/hydrochlorothiazide, Xanax,  citalopram, Prilosec, vitamin C, folic acid, Zyrtec.   PAST MEDICAL HISTORY:  1. History of elevated liver function tests secondary to medications.  2. Gastroesophageal reflux disease.  3. Metatarsalgia.  4. Dermatitis herpetiformis.  5. Aplastic anemia.  6. Atrophic vaginitis.  7. Seborrheic dermatitis.  8. Panic attacks with anxiety.  9. Depression.  10.Obesity.  11.Gallstones.  12.Urinary incontinence.  13.Postmenopausal.  14.Peripheral edema.  15.Hemorrhoids.  16.Childhood illnesses, to  include measles and mumps.   PAST SURGICAL HISTORY:  1. Gastric bypass, 1978.  2. C-section and tubal, 1982.  3. Arthroscopies in 1983 and 1999.  4. Hysterectomy in 2000.  5. Left total knee in 2001.  6. Gallbladder in 2002.  7. Hernia repair in 2003.  8. Revision left total knee, 2005.   FAMILY HISTORY:  Father deceased, mother deceased.  A sister, age 4,  with hypertension.  Daughter, age 40.   SOCIAL HISTORY:  Married. 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  Nonsmoker.  No alcohol.  One daughter.  Husband will be  assisting with care after surgery.  Four steps entering her home.   REVIEW OF SYSTEMS:  GENERAL:  No fevers, chills, or night sweats.  NEUROLOGIC:  No seizures, syncope, or paralysis.  RESPIRATORY: No  shortness breath, productive cough, or hemoptysis.  CARDIOVASCULAR:  No  chest pain, no orthopnea.  GI:  does have reflux.  GU:  A little bit of  incontinence, frequency.  No dysuria or hematuria.  MUSCULOSKELETAL:  Left knee.  PHYSICAL EXAMINATION:  VITAL SIGNS:  Pulse 72, respirations 14, blood  pressure 112/68.  GENERAL:  A 60 year old white female, well nourished, well developed,  short statured, overweight, alert, oriented, cooperative, pleasant,  excellent historian.  HEENT:  Normocephalic, atraumatic.  Pupils are round, reactive.  EOMs  intact.  NECK:  Supple.  CHEST: Clear anterior and posterior chest walls.  HEART:  Regular rate and rhythm.  No murmur, S1, S2 noted.  ABDOMEN:  Soft, protuberant abdomen.  Bowel sounds present.  RECTAL/BREASTS/GENITALIA:  Not done, not pertinent to present illness.  EXTREMITIES:  Left knee:  Well-healed midline scar.  No effusion.  Range  of motion -5.  She has about 5 degrees of hyperextension, about 120  degrees of flexion, about 4-5 mm valgus play, with some  anterior/posterior instability.   IMPRESSION:  Loose unstable left total knee.   PLAN:  The patient will be admitted to [facility] to undergo a  revision left total knee.  Surgery will be performed by Dr. Ollen Gross.  She has been seen preoperative by Dr. Enid Baas and felt to  be stable for surgery.      [name], P.A.C.      Ollen Gross, M.D.  Electronically Signed    ALP/MEDQ  D:  03/01/2007  T:  03/02/2007  Job:  [id number]   cc:   Sibyl Parr. Darrick Penna, M.D.

## 2010-07-03 NOTE — Discharge Summary (Signed)
[name], [name]               ACCOUNT NO.:  [id number]   MEDICAL RECORD NO.:  [id number]          PATIENT TYPE:  INP   LOCATION:  1608                         FACILITY:  [facility]   PHYSICIAN:  Ollen Gross, M.D.    DATE OF BIRTH:  08/30/1950   DATE OF ADMISSION:  03/02/2007  DATE OF DISCHARGE:  03/06/2007                               DISCHARGE SUMMARY   ADMISSION DIAGNOSES:  1. Loose unstable left total knee.  2. History of elevated liver function tests secondary to medications.  3. Gastroesophageal reflux disease.  4. Metatarsalgia.  5. Dermatitis herpetovirus.  6. Aplastic anemia.  7. Atrophic vaginitis.  8. Seborrheic dermatitis.  9. Panic attacks with anxiety.  10.Depression.  11.Obesity.  12.Gallstones.  13.Urinary incontinence.  14.Postmenopausal.  15.Peripheral edema.  16.Hemorrhoids.  17.Childhood illnesses to include measles and mumps.   DISCHARGE DIAGNOSES:  1. Painful loose left total knee arthroplasty, status post left total      knee revision arthroplasty.  2. Postoperative blood loss anemia.  3. Status post transfusion without sequelae.  4. Loose unstable left total knee.  5. History of elevated liver function tests secondary to medications.  6. Gastroesophageal reflux disease.  7. Metatarsalgia.  8. Dermatitis herpetovirus.  9. Aplastic anemia.  10.Atrophic vaginitis.  11.Seborrheic dermatitis.  12.Panic attacks with anxiety.  13.Depression.  14.Obesity.  15.Gallstones.  16.Urinary incontinence.  17.Postmenopausal.  18.Peripheral edema.  19.Hemorrhoids.  20.Childhood illnesses to include measles and mumps.   PROCEDURE:  On March 02, 2007, revision left total knee arthroplasty.  Surgeon was Ollen Gross, M.D., assistant was Alexzandrew L. Perkins,  P.A.C., and anesthesia was general.  Postoperative Marcaine pain pump.   BRIEF HISTORY:  The patient is a 60 year old female with two prior total  knee arthroplasties, the last revision was  about 3-4 years ago, who has  gone on to develop significant pretibial pain that corresponds to an  area in the stem of the tibia, shows some instability symptoms and it is  felt that this represents loosening of the stem.  She has had worsening  pain and presents for a total knee arthroplasty revision.   LABORATORY DATA:  Preoperative CBC showed hemoglobin 13.1, hematocrit  38.2, white blood cell count 4.9, postoperative hemoglobin 10.1 that  drifted down to 7.7 given blood.  Post transfusion hemoglobin 9.8 and  28.7.  PT and PTT preoperative 12.9 and 28 respectively, INR 1.9.  Serial pro times followed with PT/INR 21.4 and 1.8.  Chem panel on  admission all within normal limits with exception of minimally elevated  ALT of 37.  Remaining liver function tests were within normal limits.  Serial repeats were followed.  Electrolytes remained within normal  limits.  Preoperative UA cloudy, otherwise negative.  Blood group type A  positive.   EKG on February 10, 2007; normal sinus rhythm and normal EKG performed  by Armanda Magic, M.D.   Knee films on March 02, 2007; revision of total knee arthroplasty  without complicating features.   HOSPITAL COURSE:  The patient was admitted to [facility] and taken to the OR and underwent  the above procedure without  complications.  The patient tolerated the procedure well and later  transferred to the recovery room and orthopedic floor and started on PCM  and p.o. analgesics for pain control following surgery.  She has done  pretty well.  On the morning of day #1 hemoglobin was stable.  Started  on short term anxiety in the form of Xanax, resumed home medications,  and had elevated LFT's in the past.  Her preoperative LFT's were all  normal with the exception of minimally elevated ALT of 37, high normal  is 35.  Mild asymptomatic hypotension noted, held the Maxzide with  parameters.  Output was good.  Started getting up with PT.   By day #2  she still had some pain and blood pressure was still running a little  low and was lower, but otherwise she was asymptomatic with a hemoglobin  of 8.7 contingent on the iron.  She started to progress and do a little  bit more activity with therapy.  Unfortunately the hemoglobin went down  further down to 7.7 by postoperative day #3.  I recommended blood and  gave transfusion.  Dressing was changed and incision looked good.  Despite the low hemoglobin she continued to do well with physical  therapy.  Hemoglobin improved up to nearly 10 at 9.8.  She was  progressing well with physical therapy by day #4 and was discharged  home.   PLAN:  The patient was discharged home on March 06, 2007.   DISCHARGE DIAGNOSIS:  Please see above.   DISCHARGE MEDICATIONS:  1. OxyIR.  2. Coumadin.  3. Nu-Iron.  4. Robaxin.   DIET:  As tolerated.   FOLLOWUP:  Next Thursday on March 12, 2007.   ACTIVITY:  Weightbearing as tolerated.  Home health PT on Thursday.  Total knee protocol.   DISPOSITION:  Home.   CONDITION ON DISCHARGE:  Improving.      Alexzandrew L. Perkins, P.A.C.      Ollen Gross, M.D.  Electronically Signed    ALP/MEDQ  D:  04/21/2007  T:  04/21/2007  Job:  [id number]   cc:   Janetta Hora. Fields, MD  [address], [place] [address]

## 2010-07-06 NOTE — Discharge Summary (Signed)
[name]. [name]  Patient:    [name], [name]                      MRN: [id number] Adm. Date:  [id number] Disc. Date: [id number] Attending:  Tobin [name] Dictator:   [name], M.D. CC:         [name], M.D., [name]  [name], M.D., [name]   Discharge Summary  DISCHARGE DIAGNOSES: 1. Ruled out for myocardial infarction. 2. Obesity. 3. Panic attacks. 4. Gastroesophageal reflux.  DISCHARGE MEDICATIONS: 1. Dyazide one tablet p.o. q.d. 2. Norpramin 300 mg q.h.s. 3. Xanax 0.5 mg q.h.s. p.r.n. 4. Prilosec 20 mg q.h.s. 5. Claritin D 10 mg q.a.m. 6. Premarin 1.25 mg q.d. 7. Aspirin 325 mg q.d.  ADMISSION HISTORY AND PHYSICAL:  CHIEF COMPLAINT:  Chest pain.  HISTORY OF PRESENT ILLNESS:  This 60 year old Caucasian female with a one week history of cold symptoms awakened the day of admission with left-sided substernal chest pain, 8/10, associated with diaphoresis and dyspnea.  Pain was nonradiating, dull, crampy and squeezing. She denied nausea or dizziness. She took an aspirin and presented to the emergency department within 10 minutes of the onset of pain.  At the time of presentation, she was pain-free. Cardiac risk factors include obesity, a positive family history, and mildly elevated total cholesterol and LDL.  She is a nonsmoker and rare drinker.  REVIEW OF SYSTEMS:  Significant for a one-week history of a nonproductive cough, three or four episodes of chest pain at rest in the past year.  Review of systems is otherwise noncontributory.  PAST MEDICAL HISTORY:  Obesity, panic attacks, gastroesophageal reflux, and seborrheic dermatitis.  ALLERGIES:  No known drug allergies.  MEDICATIONS:  Dyazide, Norpramin, Xanax, Prilosec, Claritin D and Premarin.  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.  She is a nonsmoker and a rare  drinker.  FAMILY HISTORY:  Father had angina, deceased age 66 of a PE.  He weighed 500 pounds.  Maternal half uncle died of an MI in his 90s.  No other history of cardiovascular disease.  ADMISSION PHYSICAL EXAMINATION:  GENERAL APPEARANCE:  This is a well-appearing Caucasian female in no acute distress.  VITAL SIGNS:  Temperature 96.3, pulse 86, respiratory rate 12, blood pressure 113/63, oxygen saturation 100% on room air.  HEENT:  Within normal limits.  LUNGS:  Clear to auscultation bilaterally.  CARDIOVASCULAR:  Regular rate and rhythm.  ABDOMEN:  Soft and nontender and nondistended with positive bowel sounds.  EXTREMITIES:  There were 2+ pulses throughout with full range of motion.  ADMISSION LABS:  Sodium 136, potassium 5.5, chloride 105, bicarb 28, BUN 14, creatinine 0.5, glucose 75.  Cardiac enzymes showed CK 165, MB 8.0, index 4.8; CK 93, MB 7.3; CK 70, MB 5.2.  Troponin I 0.01, 0.01, 0.02.  EKG showed normal sinus rhythm, no ST changes, no T-waves.  Chest x-ray is clear.  [name] COURSE:  #1 - CHEST PAIN WITHOUT EKG CHANGES:  The first CK-MB was positive.  The next two sets were negative.  All three troponins were negative.  Following admission, [name] did not complain of any further chest pain or other symptoms suggestive of a cardiac etiology.  An EKG the following morning was within normal limits.  Home medications were continued and she as started on one aspirin daily.  #2 - GASTROESOPHAGEAL  REFLUX:  Prilosec was continued.  #3 - PANIC DISORDER:  Xanax was continued.  #4 -  HYPERKALEMIA:  This resolved spontaneously.  A follow-up potassium the next morning was 3.5.  FOLLOW-UP:  This is scheduled with [name], M.D., at [name] on Thursday, April 03, 2000, at 4:30 p.m. and with [name], M.D., at [name] on Thursday, April 10, 2000, at 9 a.m. DD:  03/24/00 TD:  03/25/00 Job: [id number]

## 2010-07-06 NOTE — Op Note (Signed)
NAME:  [name], [name]                         ACCOUNT NO.:  [id number]   MEDICAL RECORD NO.:  [id number]                   PATIENT TYPE:  AMB   LOCATION:  ENDO                                 FACILITY:  [facility]   PHYSICIAN:  Florencia Reasons, M.D.             DATE OF BIRTH:  05/13/1950   DATE OF PROCEDURE:  10/09/2001  DATE OF DISCHARGE:  10/09/2001                                 OPERATIVE REPORT   PROCEDURE PERFORMED:  Colonoscopy.   INDICATIONS FOR PROCEDURE:  The patient is a 60 year old female with  occasional small volume hematochezia and need for colon cancer screening.  She tends to run chronically constipated.   FINDINGS:  1. Mediocre prep.  2. No pathology identified.   DESCRIPTION OF PROCEDURE:  The nature, purpose and risks of the procedure  have been discussed with the patient who provided written consent.  Sedation  for this procedure and the upper endoscopy which preceded it totaled  fentanyl 100 mcg and Versed 10 mg IV without arrhythmias or desaturation.   The Olympus adjustable tension pediatric video colonoscope was advanced  through a somewhat fixated sigmoid region and then with moderate ease most  of the way around the remainder of the colon, but then with some looping  that had to be overcome by having the patient in the supine position with  external abdominal compression.  We then moved the patient into the right  lateral decubitus position and I was able to advance the scope to reaching  just above the cecum, after which we turned the patient back into the left  lateral decubitus position and the tip of the scope advanced into the base  of the cecum as identified by clear visualization of the appendiceal  orifice.  Pull-back was then performed.   There was quite a bit of amorphous stool residue and debris, as well as some  undigested vegetable matter such as corn, present here and there throughout  the colon, especially in the proximal colon.  This  could have obscured small  polyps or lesions but it is not felt that any large polyps (for example, 1  cm or greater) or masses would have been obscured.   On this examination, no polyps, cancer, colitis, vascular malformations, or  diverticular disease were observed, although in the sigmoid region there was  quite a bit of muscular hypertrophy, and it is possible that there were some  diverticula in the clefts that could not be endoscopically visualized.  Retroflexion in the rectum as well as reinspection of the rectosigmoid was  unremarkable, and pull-out through the anal canal showed just mild internal  hemorrhoids which are the presumed source of the patient's recent bleeding.   No biopsies were obtained during this procedure.  The patient tolerated the  procedure well and there were no apparent complications.    IMPRESSION:  Normal colonoscopy, with quality of examination slightly  compromised by  mediocre prep.   PLAN:  Consider repeat colonoscopy in approximately five years for ongoing  colon cancer screening.                                               Florencia Reasons, M.D.    RVB/MEDQ  D:  10/09/2001  T:  10/12/2001  Job:  [id number]   cc:   Sibyl Parr. Fields, M.D.  [address] [place]  [place] [address]  Fax: [phone]

## 2010-07-06 NOTE — Discharge Summary (Signed)
[name], [name]               ACCOUNT NO.:  [id number]   MEDICAL RECORD NO.:  [id number]          PATIENT TYPE:  INP   LOCATION:  5036                         FACILITY:  [facility]   PHYSICIAN:  Claude Manges. Whitfield, M.D.DATE OF BIRTH:  14-Mar-1950   DATE OF ADMISSION:  11/15/2003  DATE OF DISCHARGE:  11/19/2003                                 DISCHARGE SUMMARY   ADMISSION DIAGNOSES:  1.  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  2.  Chronic vitamin B12 deficiency with history of anemia.  3.  Panic disorder.  4.  Irritable bowel syndrome.  5.  Increased transaminase possibly secondary to nonsteroidal anti-      inflammatory drugs.  6.  Obesity.  7.  History of hepatitis.   DISCHARGE DIAGNOSES:  1.  Failed left total knee arthroplasty with tibial loosening, now status      post revision of the tibial component and polyethylene component of the      left knee replacement.  2.  Acute blood loss anemia secondary to surgery.  3.  Chronic vitamin B12 deficiency with a history of anemia.  4.  Panic disorder.  5.  Irritable bowel syndrome.  6.  Increased transaminase possibly secondary to nonsteroidal anti-      inflammatory drugs.  7.  Obesity.  8.  History of hepatitis.   SURGICAL PROCEDURE:  On November 15, 2003, [name] underwent a revision  of her tibial component of her left knee replacement by Dr. Claude Manges.  Whitfield, assisted by Dr. Lenard Galloway. Mortenson and Richardean Canal, P.A.  She  had a LCS Complete cruciform metal-backed patella replacement, size  standard, with a PFC Sigma Modular Knee System, fluted rod, 60 x 75 mm, an  MBT revision cemented tray, size 2.5, with an MBT step-wedge 2.5 size, 5-mm  thickness, 2 of these were used, and then an LCS Complete RP insert, size  standard, 20-mm thickness.   COMPLICATIONS:  None.   CONSULTS:  1.  Pharmacy consult for Coumadin therapy, November 15, 2003.  2.  Physical therapy/occupational therapy consult,  November 16, 2003.  3.  Case Management consult, November 18, 2003.   HISTORY OF PRESENT ILLNESS:  This 60 year old white female patient presented  to Dr. Cleophas Dunker with a history of a left knee replacement done by him in  September of 2001.  She had been doing well until 2 years ago, when she  started having gradual left knee pain.  At this point, it is a constant  stabbing and achy, diffuse about the joint with some radiation into the  proximal tibia.  It increases with prolonged sitting and then moving to a  standing position.  Her pain decreased with range of motion.  She has failed  conservative treatment.  X-rays show some loosening of the tibial component  and because of that, she is presenting for a revision knee replacement.   HOSPITAL COURSE:  [name] tolerated her surgical procedure without any  immediate postoperative complications.  She was transferred to 5000 and  started on Coumadin for DVT prophylaxis, with heparin until the  Coumadin was  therapeutic.  Postop day 1, she did have some nausea.  Potassium was low and  that was supplemented.  She was started on therapeutic protocol.  Postop day  2, she had some complaints of heartburn; that was treated with medications.  She was switched to p.o. pain medications.  Hemoglobin was 11.2, hematocrit  32.  She was continued on therapy.   She made good progress over the next several days.  On November 19, 2003, it  was felt she was ready for discharge home and was discharged home later that  day.   DISCHARGE INSTRUCTIONS:   DIET:  She can resume her regular pre-hospitalization diet.   MEDICATIONS:  She may resume her home medications; these include:  1.  Effexor XR 150 mg p.o. q.a.m.  2.  Triamterene/hydrochlorothiazide 37.5/25 mg p.o. q.a.m. p.r.n.  3.  Alprazolam 0.5 mg p.o. q.a.m. b.i.d. and then possibly t.i.d. as needed      p.r.n.  4.  Vitamin B12 injection 1000 mcg injected once a month.  5.  Prilosec 20 mcg p.o.  q.a.m.  6.  Multivitamin 1 tablet p.o. q.a.m.  7.  Black cohosh 540 mg p.o. q.a.m.  8.  Vitamin C 1000 mg p.o. q.a.m.  9.  Super B complex p.o. q.a.m.  10. Vitamin B6 100 mg p.o. q.a.m.  11. Folic acid 800 mcg p.o. q.a.m.  12. Benefiber 1-2 tablespoons p.o. q.a.m.   Additional medications at this time include:  1.  OxyContin 10 mg 1 tablet p.o. b.i.d.  2.  Percocet 5 mg 1-2 tablets p.o. q.4 h. p.r.n. breakthrough pain.  3.  Coumadin 5 mg -- take as directed by pharmacy for a total of a month      after surgery.   ACTIVITY:  She can be out of bed partial weightbearing on the left leg with  the use of a walker.  She is to have home CPM and home health PT per [facility], also home health pharmacy to monitor the Coumadin.   WOUND CARE:  She is to keep the left knee incision clean and dry, and may  shower after no drainage from the wound for 2 days.  She is to notify Dr.  Cleophas Dunker of temperature greater than 101.5, chills, pain unrelieved by pain  medications or foul-smelling drainage from the wound.   FOLLOWUP:  She needs to follow up with Dr. Cleophas Dunker in our office in  approximately 10 days and she needs to call [phone] to set up that  appointment.   LABORATORY DATA:  Lower extremity venous Doppler done on November 22, 2003  showed no evidence of DVT, superficial thrombus or Baker's cyst bilaterally.   On November 16, 2003, hemoglobin 12.6, hematocrit 35.3.  On November 17, 2003, white count 5.8, hemoglobin 11.2, hematocrit 32, platelets 152,000.   On November 10, 2003, PT 12.2, INR of 0.9 and on November 19, 2003, PT 20.4,  INR 2.2.   On November 10, 2003, glucose 100.  On November 16, 2003, sodium 132,  potassium 3.3, glucose 140, calcium 8.1 and on the 29th, sodium 135,  potassium 3.6, glucose 128, BUN 5, creatinine 0.5 and calcium 8.0.  All  other laboratory studies were within normal limits.      KED/MEDQ  D:  12/22/2003  T:  12/22/2003  Job:   [id number]

## 2010-07-06 NOTE — H&P (Signed)
[place]  Patient:    [name], [name]                      MRN: [id number] Adm. Date:  [id number] Attending:  Doug Sou Dictator:   Nolon Nations, M.D.                         History and Physical  CHIEF COMPLAINT:  Chest pain.  HISTORY OF PRESENT ILLNESS:  Ms. Branscom is a 60 year old female with a one week history of head cold who presents with acute chest pain this morning. She noticed at 9:50 in the morning, while sitting at the kitchen table. This pain was in her left chest, nonradiating, dull, crampy, and squeezing. It was associated with diaphoresis and shortness of breath. She denies nausea or dizziness. She laid down in bed for two or three minutes during which the pain continued. She took aspirin and decided to come to the ER after the pain continued for ten minutes further. The pain waxed and waned. It worsened in the car, but eased off before arriving to the ER.  The patient reports that this pain felt different than her normal panic attacks. She does not have chest pain with panic attacks, instead has lightheadedness. She reports that she has had this type of chest pain in the past, five or six times with three or four in the last year. This pain is nonexertional and relieves on its own. It lasts only two or three minutes.  Her cardiac risk factors demonstrates that she has no known cardiac risk factors, that is she denies cigarettes, diabetes, family history, hypertension, and reports as normal cholesterol. She does have a history of recent new stressor with her daughter, son-in-law and grandchild recently moving into the house. This may have exacerbated a panic attack.  REVIEW OF SYSTEMS:  CONSTITUTIONAL:  Negative for fever or weight loss. CARDIOVASCULAR:  Chest pain. She reported no palpitations. RESPIRATORY: Cough, nonproductive. GASTROINTESTINAL:  Denies nausea. SKIN:  Denies. NEUROLOGIC:  Denies. PSYCHIATRIC:   Panic attacks per history. MUSCULOSKELETAL:  Denies. EYES:  Denies. ENDOCRINE:  Reports menopausal hot flashes. ENT: Congestion. GENITOURINARY:  None. HEMATOLOGY:  None.  PAST MEDICAL HISTORY: 1. Panic attacks. These are rare and well-controlled. No panic attacks in six    years. 2. GERD. 3. Seborrheic dermatitis. 4. Allergies affecting the ears causing a vacuum headache. 5. Low B12 levels thought to be secondary to gastric bypass. 6. History of fibroids. 7. Status post gastric bypass. 8. Status post hysterectomy and BSO. 9. Status post left knee replacement.  MEDICATIONS: 1. Dyazide one tablet p.o. q.d. for lower extremity edema. 2. Norpramin 225/300 mg p.o. q.h.s. 3. Xanax 0.5 q.h.s. 4. Prilosec 20 mg p.o. q.d. 5. Claritin-D one b.i.d. 6. Prempro 1.25 mg q.d.  ALLERGIES:  VIOXX causes swelling in the extremities. Liquid CODEINE causes dizziness.  FAMILY HISTORY:  Dad with angina and a history of obesity, 500 pounds. He died of a PE at 49 years of age. No siblings with cardiac history. Mother died of noncardiac causes.  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 Nonsmoker. Denies any alcohol.  PHYSICAL EXAMINATION:  VITAL SIGNS:  Temperature 96.3, blood pressure 113/63, pulse 86, respiratory rate 12, 100% on room air.  GENERAL:  Alert, oriented and well-developed and in no acute distress.  HEENT:  Normocephalic atraumatic. EOMI, PERRLA.  NECK:  Supple. No lymphadenopathy noted. No thyromegaly.  CARDIOVASCULAR:  Regular rate and rhythm. No murmurs, rubs or gallops.  LUNGS:  Clear to auscultation bilaterally. No crackles, wheezes or rhonchi.  ABDOMEN:  Obese, soft. Nontender, nondistended. Gastric bypass scar present. No hepatosplenomegaly.  MUSCULOSKELETAL:  Trace edema to ankle. No cyanosis or clubbing.  NEUROLOGICAL:  Cranial nerves II-XII grossly intact.  LABORATORY DATA:  Sodium 136, potassium 5.5, chloride 105, bicarbonate 28, BUN 14, creatinine 0.5, glucose  75.  EKG normal sinus rhythm with a rate of 80, flipped T wave in V1. No ST changes, no Qs.  Chest x-ray pending.  ASSESSMENT AND PLAN:  This is a 60 year old female with a history of obesity, panic attacks, GERD who presents with chest pain unlike previous panic attacks. 1. Chest pain. No known cardiac risk factors, possibly an atypical panic    attack given recent social stressors. Will rule out MI. If negative expect    can do a stress test in the office as an outpatient. 2. Panic attacks. Continue Norpramin and Xanax. 3. Gastroesophageal reflux disease. Continue Prilosec. 4. Allergies. Continue Claritin-D. 5. Menopause. Continue Premarin. 6. Fluids. Continue Dyazide. DD:  03/23/00 TD:  03/24/00 Job: [id number]

## 2010-07-06 NOTE — H&P (Signed)
NAME:  [name], [name]                         ACCOUNT NO.:  [id number]   MEDICAL RECORD NO.:  [id number]                   PATIENT TYPE:  INP   LOCATION:  NA                                   FACILITY:  [facility]   PHYSICIAN:  Claude Manges. Cleophas Dunker, M.D.            DATE OF BIRTH:  08-29-1950   DATE OF ADMISSION:  11/15/2003  DATE OF DISCHARGE:                                HISTORY & PHYSICAL   CHIEF COMPLAINT:  Left knee pain for the last two years.   HISTORY OF PRESENT ILLNESS:  This 60 year old white female patient presented  to Dr. Cleophas Dunker with a history of left knee replacement done by him on  November 01, 1999.  She had been doing well until about two years ago when  she had gradual onset of progressively worsening left knee pain.  She has  had several falls on and off since the pain started but no specific injury  to her knee.   At this point, the pain is now constant stabbing and aching sensation,  diffuse about the joint, with some radiation into the proximal tibia.  The  pain increases with prolonged sitting and then trying to move to a standing  position or if she is overly active during the day.  Pain decreases when she  gets moving.  She is currently taking Ultram for pain, and that provides  minimal relief. She had been on ibuprofen in the past but had elevated liver  functions this year which was believed to be due to the NSAIDs, and she is  now off that.  The knee does give way at times and swells at times.  It will  awaken her if she moves the wrong way, but there is no popping, catching,  grinding, or locking. She occasionally uses a cane to assist with  ambulation. She does walk with a limp.  She has had a bone scan which showed  loosening around the tibial components of the total knee.   ALLERGIES:  1.  VIOXX  caused edema.  2.  CODEINE LIQUID causes nausea.  3.  MORPHINE PCA causes nausea and vomiting.  4.  ABSORBABLE SUTURES as she had several infections  related to them and was      told never to have them again.   CURRENT MEDICATIONS:  1.  Effexor XR 150 mg 1 tablet p.o. q.a.m.  2.  Triamterene/hydrochlorothiazide 37.5/25 mg 1 tablet p.o. q.a.m. p.r.n.      swelling of her legs.  3.  Alprazolam 0.5 mg 1 tablet p.o. q.a.m. and then b.i.d. to t.i.d. p.r.n.  4.  Vitamin B12 injection 1000 mcg injected once a month.  5.  Prilosec 20 mg 1 tablet p.o. q.a.m.  6.  Multivitamins 1 tablet p.o. q.a.m.  7.  Black Cohosh 540 mg 1 tablet p.o. q.a.m., last dose November 07, 2003.  8.  Vitamin C 1000 mg 1 tablet p.o. q.a.m.  9.  Super B complex 1 tablet p.o. q.a.m.  10. Vitamin B6 100 mg 1 tablet p.o. q.a.m.  11. Folic acid 800 mcg 1 tablet p.o. q.a.m.  12. Benefiber 1 to 2 tablespoons p.o. q.a.m.   PAST MEDICAL HISTORY:  1.  Chronic B12 deficiency which can lead to anemia.  This is currently      stable.  2.  History of infectious hepatitis twice, once in 1969 and once as      complication of mononucleosis in 1977.  3.  Panic disorder controlled with medication and counseling.  4.  Irritable bowel syndrome, tends to be more constipated.  5.  Gastroesophageal reflux disease.   She denies any history of diabetes mellitus, hypertension, thyroid disease,  hiatal hernia, peptic ulcer disease, heart disease, asthma or any other  chronic medical condition other than noted previously.   PAST SURGICAL HISTORY:  1.  Gastric bypass in 1978 by Dr. Marlow Baars.  2.  Cesarean section in 1982 by Dr. Annamaria Helling.  3.  Bilateral tubal ligation December 1982 by Dr. Dewaine Conger.  4.  Left knee arthroscopy in 1983 by Dr. Claude Manges. Whitfield.  5.  Left knee arthroscopy in 1999 by Dr. Claude Manges. Whitfield.  6.  Hysterectomy in 2000 by Dr. Ernestina Patches.  7.  Left total knee replacement November 01, 1999, by Dr. Claude Manges.      Whitfield.  8.  Cholecystectomy in 2002 by Dr. Cicero Duck.  9.  Hernia repair in 2004 by Dr. Glenna Fellows.   The only complication  she reported from surgery is having a fair amount of  side effects with nausea and vomiting due to MORPHINE PCA.   SOCIAL HISTORY:  She denies any history of cigarette smoking, alcohol use,  or drug use.  She is married and has two children.  She and her husband live  in a one-story house with four steps into the main entrance.  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  Her medical doctor is Dr. Royal Hawthorn B. Fields at [employer] at [phone].   FAMILY HISTORY:  Mother died at age 62 with suicide.  Father died in his 84s  with blood clot to the heart but did have significant history of obesity.  She had one brother who passed away at 71 hours old and one sister who is  living at age 19 and in good health.  Her son is 48 and daughter 46, and  they are alive and well.   REVIEW OF SYSTEMS:  She does wear glasses at all times.  Reflux is  controlled with Prilosec.  She is unsure of the type of hepatitis she had,  and that has never been clarified.  She does have irritable bowel syndrome  that is treated well with Benefiber.  She has seasonal allergies which she  treats just with over-the-counter medications.  She did have elevated liver  function tests this year and thought to be due to nonalcoholic  steatohepatitis, but now it may be related to the ibuprofen she had been on.  She does have mild TMJ.  She had laryngitis about three weeks ago and does  have some urge incontinence.  She does not have a living will nor power of  attorney.  All other systems are negative and noncontributory.   PHYSICAL EXAMINATION:  GENERAL:  Well-developed, well-nourished, overweight  white female who walks with a left-sided limp.  Brace is on her left knee.  Mood and affect are appropriate.  She talks easily with examiner.  VITAL SIGNS: Height 5 feet 0 inches, weight 207 pounds.  BMI is 39-1/2. Temperature 96.5 degrees F, pulse 60,  respirations 16, blood pressure  128/78.  HEENT:  Normocephalic and atraumatic without frontal or maxillary sinus  tenderness to palpation.  Conjunctivae pink, sclerae anicteric.  PERRLA.  EOMs intact.  No visible external ear deformities.  Hearing grossly intact.  Tympanic membranes pearly grey bilaterally with good light reflex.  Nose and  nasal septum intact.  She does have a deviated septum on the left.  No  exudates or polyps noted.  Buccal mucosa pink and moist.  Good dentition.  Pharynx without erythema or exudate.  Tongue and uvula midline.  Tongue  without fasciculations, and uvula rises equally with phonation.  NECK:  No visible masses or lesions noted.  Trachea midline.  No palpable  lymphadenopathy nor thyromegaly.  Carotids +2 bilaterally without bruits.  Full range of motion and nontender to palpation along the cervical spine.  CARDIOVASCULAR:  Heart rate and rhythm regular.  S1, S2 present without  rubs, clicks, or murmurs noted.  RESPIRATORY:  Respirations even and unlabored.  Breath sounds clear to  auscultation bilaterally without rales or wheezes noted.  ABDOMEN:  She has a well-healed abdominal incision and several smaller  incisions noted about the abdomen that are well healed and approximated.  Bowel sounds presents x 4 quadrants.  Soft, nontender to palpation without  hepatosplenomegaly or CVA tenderness.  EXTREMITIES:  Femoral pulses +2 bilaterally.  BACK:  Nontender to palpation along the entire length of the vertebral  column.  BREASTS/GU/RECTAL/PELVIC:  These exams deferred at this time.  MUSCULOSKELETAL:  No obvious deformities bilateral upper extremities with  full range of motion of these extremities without pain.  Radial pulses +2  bilaterally.  She has full range of motion of his hips, ankles, and toes  bilaterally.  DP and PT pulses +2.  She does have +1 to 2 mildly pitting  edema both lower extremities.  No calf tenderness with palpation and  negative  Homan's sign bilaterally.  RIGHT KNEE:  Has full extension and flexion to 110 degrees without crepitus.  No effusion in the knee.  Minimal pain with palpation along the medial joint  line, none laterally.  Stable to varus and valgus stress.  Negative anterior  drawer.  LEFT KNEE:  Well-healed midline incision.  No erythema or ecchymosis.  She  has full extension and flexion to 110 degrees with no real crepitance.  She  is tender to palpation on both medial and lateral joint lines.  She does  open a little bit laterally with varus stress.  She does have spider vein  collection just distal to her incision line on her anterior tibia.  NEUROLOGIC:  Alert and oriented x 3.  Cranial nerves II-XII grossly intact.  Strength 5/5 bilateral upper and lower extremities.  Rapid alternating  movements intact.  Deep tendon reflexes 2+ bilateral upper and lower  extremities.  Sensation intact to light touch.  RADIOLOGIC FINDINGS:  X-rays taken in August 2004 show maybe some  radiolucency around the tibial stem.   Bone scan done about that time showed increased uptake around the tibial  component consistent with loosening.   IMPRESSION:  1.  Failed left total knee arthroplasty with positive bone scan for tibial      component loosening.  2.  Chronic vitamin B12 deficiency with  history of anemia.  3.  Panic disorder.  4.  Irritable bowel syndrome.  5.  Increased transaminase possibly due to nonsteroidal anti-inflammatory      drugs.  6.  History of hepatitis.  7.  Obesity.   PLAN:  Ms. Liss will be admitted to [facility] on November 15, 2003, where she will undergo a revision of her left total knee arthroplasty  by Dr. Claude Manges. Whitfield.  She will undergo all the routine preoperative  laboratory tests and studies prior to this procedure.  We will check her  transaminase while she is there and give those results to Dr. Kellie Simmering.  If  we have any medical issues while she is  hospitalized, we will consult  internal medicine at [facility] who is her regular  doctor.      KED/MEDQ  D:  11/07/2003  T:  11/07/2003  Job:  [id number]

## 2010-07-06 NOTE — Op Note (Signed)
   NAME:  [name], [name]                         ACCOUNT NO.:  [id number]   MEDICAL RECORD NO.:  [id number]                   PATIENT TYPE:  AMB   LOCATION:  ENDO                                 FACILITY:  [facility]   PHYSICIAN:  Florencia Reasons, M.D.             DATE OF BIRTH:  [birth year]   DATE OF PROCEDURE:  10/09/2001  DATE OF DISCHARGE:  10/09/2001                                 OPERATIVE REPORT   PROCEDURE PERFORMED:  Upper endoscopy.   INDICATIONS FOR PROCEDURE:  Intensified reflux symptoms and even occasional  vomiting in a patient status post gastric bypass surgery approximately 25  years ago.   FINDINGS:  Normal postoperative anatomy without evidence of acid damage.   DESCRIPTION OF PROCEDURE:  The nature, purpose and risks of the patient have  been discussed with the patient who provided written consent.  The Olympus  small-caliber adult video endoscope was passed under direct vision.  The  vocal cords looked entirely normal.  The esophagus was readily entered and  noted to have normal mucosa without evidence of any reflux esophagitis,  Barrett's esophagus, varices, infection, or neoplasia.  No ring or stricture  was identified.  The gastric pouch appeared intact with the anastomosis to  the small bowel widely patent.  The small bowel was examined for a short  distance and had normal-appearing mucosa.  No polyps, ulcers, masses or  inflammatory changes were identified anywhere on this examination.  The  scope was removed from the patient who tolerated the procedure well without  apparent complication.   IMPRESSION:  Normal examination status post gastric bypass surgery.   PLAN:  Symptomatic management of reflux symptoms.                                               Florencia Reasons, M.D.    RVB/MEDQ  D:  10/09/2001  T:  10/12/2001  Job:  [id number]   cc:   Royal Hawthorn B. Fields, M.D.  [address] [place]  [place] [address]  Fax: [phone]

## 2010-07-06 NOTE — Op Note (Signed)
NAME:  GEANA, WALTS                         ACCOUNT NO.:  [id number]   MEDICAL RECORD NO.:  [id number]                   PATIENT TYPE:  OIB   LOCATION:  2899                                 FACILITY:  [facility]   PHYSICIAN:  Sharlet Salina T. Hoxworth, M.D.          DATE OF BIRTH:  [birth year]   DATE OF PROCEDURE:  09/27/2002  DATE OF DISCHARGE:                                 OPERATIVE REPORT   PREOPERATIVE DIAGNOSIS:  Ventral incisional hernia.   POSTOPERATIVE DIAGNOSIS:  Ventral incisional hernia.   SURGICAL PROCEDURES:  Laparoscopic repair of ventral incisional hernia.   SURGEON:  Lorne Skeens. Hoxworth, M.D.   ANESTHESIA:  General.   BRIEF HISTORY:  Ms. Kachel is a 60 year old white female who in 1978  underwent open gastric stapling at [facility] for morbid obesity.  She has  lost 150 pounds.  For several months, she has noted an uncomfortable bulge  in her umbilicus that has gradually worsened.  Examination has revealed a  moderate sized incisional hernia presenting through her midline hernia above  the umbilicus.  Options for repair have been discussed and she has elected  to proceed with laparoscopic repair.  The nature of the procedure,  indications, and risks of bleeding, infection, and recurrence were discussed  and understood.  She is now brought to the operating room for this  procedure.   DESCRIPTION OF PROCEDURE:  The patient was brought to the operating room and  placed in the supine position on the operating table.  General endotracheal  anesthesia was induced.  She received preoperative antibiotics.  PAFs were  in placed.  The abdomen was widely sterilely prepped and draped.  The  abdomen was accessed in the left flank with a 12 mm Optiview trocar without  difficulty and pneumoperitoneum established.  There were some moderate  omental adhesions to the midline.  Under direct vision a 5 mm trocar was  placed in the left flank and two 5 mm trocars placed in the right  flank.  The midline omental adhesions were all taken down with harmonic scalpel.  There was a several centimeter hernia just above the umbilicus, but also two  hernias more superior along the midline incision as well.  All anterior  abdominal wall adhesions were taken down.  There were no bowel adhesions.  A  20 x 10 cm piece of Parietex composite mesh was chosen that would widely  cover all of the hernia defects with at least 4 cm overlap superiorly and  inferiorly.  Eight stay sutures were placed around the periphery of the  mesh, which was then moistened, rolled, placed into the abdomen, unfurled,  and oriented.  Through eight small corresponding stab incisions, the stay  sutures were brought up through the anterior abdominal wall and then tied  with very nice unfurling of the mesh.  Mesh was then tacked around its  periphery completely with the endo tacker.  This provided  nice broad  coverage of the defects with the mesh nicely and tautly unfurled.  Following  this, trocars were removed under direct vision and all CO2 was evaluated  from the peritoneal cavity.  The trocar sites were closed with staples per  patient request and the stab wounds closed with Steri-Strips.  The sponge,  needle, and instrument counts were correct were correct.  Dry sterile  dressings were applied and the patient was taken to recovery in good  condition.                                              Lorne Skeens. Hoxworth, M.D.   Tory Emerald  D:  09/27/2002  T:  09/27/2002  Job:  [id number]

## 2010-07-06 NOTE — Op Note (Signed)
[name], [name]               ACCOUNT NO.:  [id number]   MEDICAL RECORD NO.:  [id number]          PATIENT TYPE:  INP   LOCATION:  2550                         FACILITY:  [facility]   PHYSICIAN:  Claude Manges. Whitfield, M.D.DATE OF BIRTH:  Nov 07, 1950   DATE OF PROCEDURE:  11/15/2003  DATE OF DISCHARGE:                                 OPERATIVE REPORT   PREOPERATIVE DIAGNOSIS:  Loosened, painful tibial component, left total knee  replacement.   POSTOPERATIVE DIAGNOSIS:  Loosened, painful tibial component, left total  knee replacement.   OPERATION PERFORMED:  Revision, left total hip replacement (tibial component  and both polyethylene components, i.e. tibia and patella).   SURGEON:  Claude Manges. Cleophas Dunker, M.D.   ASSISTANT:  1.  Lenard Galloway. Chaney Malling, M.D.  2.  Richardean Canal, P.A.   ANESTHESIA:  General orotracheal anesthesia with supplemental femoral nerve  block.   COMPLICATIONS:  None.   COMPONENTS:  Depuy MBT step wedge 2.5 tibial tray with two 5 mm wedges and a  16 mm x 75 mm fluted rod.  A 20 mm LCS complete rotating platform  polyethylene insert and a standard polyethylene component to the patella.   DESCRIPTION OF PROCEDURE:  With the patient comfortable on the operating  table under general orotracheal anesthesia, the left lower extremity was  placed in a thigh tourniquet.  The leg was then prepped with Betadine scrub  and then Duraprep from the tourniquet to the midfoot.  Sterile draping was  performed.  With the extremity still elevated, it was Esmarch exsanguinated  with the proximal tourniquet at 350 mmHg.   A midline longitudinal incision was made using the previous incision and via  sharp dissection carried down to subcutaneous tissue.  The first layer of  capsule was identified by the 2-0 Tycron sutures.  These were incised and  then carefully removed so there were no remaining foreign bodies.  The  medial parapatellar incision was made through the previous incision  where  there were also #1 Tycron sutures which were also incised and carefully  removed so that there were no remaining foreign bodies.  The joint was then  entered.  There was approximately 10 mL of clear yellow joint effusion.  There was considerable scar and scar plica formation within the joint making  it difficult to evert the patella 180 degrees but with further release, I  was able to evert the patella 180 degrees.  The knee was then flexed to 90  degrees.  There was a moderate amount of synovitis in the superior pouch  which was removed.  There was some synovitis surrounding the femoral  component which had been previously press-fit.  This synovectomy was  performed.  There was evidence of a small cyst in the posterior femur by CT  scan.  We probed very carefully, the entire femoral condyle both laterally  and medially where the cyst was suspected.  I did not see any loose bone and  did not further penetrate the bone.  The prosthesis appeared to be perfectly  stable.  An osteotome was then placed beneath the polyethylene component  and  on top of the patellotibial tray and the polyethylene stem was incised thus  loosening the polyethylene tibial component and then it was removed.  Single  spike retractor was carefully placed behind the tibia to deliver it  anteriorly.  Using a very small ________  osteotome, I carefully placed it  beneath the tibial tray circumferentially.  There was obvious loosening of  the component and fragmentation of the previously inserted polymethyl  methacrylate.  The tray was removed without difficulty and without bone  loss.  The fragments of methacrylate were carefully removed.  There appeared  to be some subsidence of the tray.  There were bony bridges posteriorly,  medially and laterally and these were removed.  We felt that the femoral  component was stable and thus revised the tibial tray.  We made a cleaning  cut transversely of the proximal tibia.   By inserting the intramedullary rod  and then placing the cutting guide over the top we removed approximately 4  to 5 mm of bone, so we had a very nice smooth recut cancellous bone surface.  We hand reamed to a 16 and then applied the trial tibial 16 mm stem to the  tibial tray which had been templated to be a 3.  As we inserted it, we felt  that there was a little bit of tilt, so we made further cuts by hand  laterally, so that we could level the joint surface.  A 75 mm fluted stem  was determined and this was applied to the trial 3.0 tibial tray and this  was impacted.  We felt we had a nice fit, but just a little bit of overlap  medially and therefore we trialed a 2.5 mm tray with a 75 mm stem and felt  that it was a much better construct.  We felt that initially the 16 was  probably too wide and therefore, finally trialed a 14 fluted stem with a 2.5  mm tray.  We inserted several polyethylene components and we reached a limit  on the width of the polyethylene and therefore used metallic wedges on the  tibial tray using two 5 mm wedges, one medially and one laterally and then  inserted our final trial using the two 5 mm wedges, the 2.5 size tibial tray  and a 16 tibial flute because as we raised the level of the tibial tray, the  canal width increased and a 16 was seen to be just perfect. At that point we  trialed a 17 and a 20 mm tibial tray polyethylene component.  A 20 gave [place]  perfect stability.  There was no opening to varus or valgus stress.  We had  perfect flexion extension without hyperextension, negative anterior and  posterior drawer sign.   The trial components were then removed.  We copiously irrigated the joint  with jet saline.  The polyethylene component to the patella was removed and  a new insert applied without difficulty.  Synovectomy was performed around  the patella.  The final tibial component including the 75 mm long, 16 mm fluted stem with a 2.5 tray with a keel  attachment and two 5 mm wedges  medially and laterally was applied using polymethyl methacrylate.  Methacrylate was not placed into the fluted stem.  After complete  maturation, we again trialed several of the tibial polyethylene components.  A 20 gave [place] the best stability.  We had perfect rotation.  There was no  opening to  varus or valgus stress.  The wound was then irrigated with saline  solution.  The tourniquet was deflated.  Gross bleeders were Bovie  coagulated, Hemovac was inserted.  The deep capsule was closed with  interrupted #1 Ethibond.  The superficial capsule closed with a 0 Ethibond,  skin closed with skin clips.  Sterile bulky dressing was applied followed by  the patient's support stocking.  There was excellent capillary refill to the  skin.  Good pulses postoperatively.  Femoral nerve block was to be performed  for postoperative pain control.  The patient tolerated the procedure without  complications.       PWW/[id number]  D:  11/15/2003  T:  11/15/2003  Job:  [id number]

## 2010-07-06 NOTE — Op Note (Signed)
. [facility]  Patient:    [name], [name]                      MRN: [id number] Proc. Date: 11/01/99 Adm. Date:  [id number] Attending:  Randolm Idol                           Operative Report  PREOPERATIVE DIAGNOSIS:  Osteoarthritis left knee.  POSTOPERATIVE DIAGNOSIS:  Osteoarthritis left knee.  PROCEDURE:  Left total knee replacement.  SURGEON:  Claude Manges. Cleophas Dunker, M.D.  ASSISTANT:  Jamelle Rushing, P.A.C.  ANESTHESIA:  General orotracheal.  COMPLICATIONS:  None.  COMPONENTS:  DePuy LCS pore-coat standard femur, cemented standard plus rotating tibial platform with a 12.5 mm bridging bearing and a cemented cruciate, metal backed patella.  PROCEDURE:  The patient comfortable on the operating table and under general orotracheal anesthesia, the right lower extremity was placed in a thigh tourniquet.  The leg was then prepped with a Betadine scrub and Duraprep from the tourniquet to the ankle.  Sterile draping was performed.  With the extremity still elevated, it was Esmarch exsanguinated with a proximal tourniquet at 350 mmHg.  A midline longitudinal incision was made centered over the patella, extending from the superior pouch of the tibial tubercle.  By sharp dissection, the incision carried down to the subcutaneous tissue.  The first layer of capsule was incised in the midline and medial parapatella incision was then made through the deep capsule.  There was approximately 20-30 cc of clear, yellow joint effusion.  The patella was everted 180 degrees and the knee flexed to 90 degrees.  There was a moderate amount of beefy red synovitis.  Total synovectomy was performed.  There was complete absence of articular cartilage on the lateral tibia, lateral femoral condyle and to a great extent along the lateral tibial plateau.  There were osteophytes along the medial and lateral femoral condyle of moderate size, more so laterally than  medially.  Patient did have about 15 degrees of valgus for standing film, but I could easily correct it to neutral on the operating table, i.e., it was not a fixed deformity.  We had templated a standard femoral and standard plus tibial component preoperatively.  These were confirmed intraoperatively.  The appropriate jigs were then applied to obtain the appropriate tibial and femoral cuts.  The 12.5 mm flexion/extension gap were utilized and symmetrical and 4 degree distal valgus cut was made.  Both the ACL and PCL were sacrificed.  MCL and LCL remained intact throughout the procedure.  Laminar spreaders were inserted on both medial and lateral compartments to remove remnants of meniscus, as well as, remnants of ACL and PCL.  The Bovie was used to coagulate small vessels in the posterior capsule.  Osteophytes were removed from the posteromedial and lateral femoral condyles.  The ______ retractor was inserted, the tibia was advanced anteriorly.  The center hole was then made in the tibia.  We trialed a standard plus rotating tibial component with a 12.5 mm bridging bearing and the standard femoral component.  As we placed the knee through a full range of motion, there was no mal rotation of the tibial component and there was no opening with the varus or valgus stress.  We had excellent stability.  The patella was then prepared by removing approximately 11 mm of bone leaving 13 mm of the patella remaining.  The cruciate jig  was applied so that we could obtain the appropriate cut in the patella.  The final component was impacted. It subluxed laterally so did a lateral release.  It was then removed as well as the other trial components.  The joint was then copiously lavaged with jet saline and antibiotic solution.  The knee was then placed in flexion.  The tibial component was cemented with poly methyl methacrylate.  Extraneous methacrylate was removed, it was impacted and further  extraneous methacrylate was removed and 12.5 mm bridging bearing was applied.  Impacted the standard femoral component with the pore-coat, placed in full extension and then applied the patella with cement and the patella clamp.  Extraneous methacrylate was removed.  After hardening of the methacrylate, the knee was placed through a full range of motion.  There was very little remaining extraneous methacrylate.  That which was present was removed with a rongeur.  I further impacted the femur.  There was a small lift off of the femur laterally, although, it was not unstable and I just inserted small wafers of bone and further impacted the femur.  There were no gaps between the component and the femur at that point.  The knee was again placed through a full range of motion.  The patella did not sublux and there was no mal rotation of the tibia.  The joint was again irrigated with the saline solution and antibiotic solution.  The tourniquet was deflated, gross bleeders were Bovie coagulated. The deep capsule was closed with interrupted 0 Ti-Cron.  The superficial capsule closed with 2-0 Ti-Cron.  Skin was closed with skin clips.  Patient has had difficulty with absorbable sutures in the past.  Hemovac was inserted prior to closure.  Sterile bulky dressing was applied followed by an Ace bandage and a knee immobilizer.  The patient was to have an epidural catheter inserted postoperatively for pain control. DD:  11/01/99 TD:  11/02/99 Job: [id number]

## 2010-07-06 NOTE — Procedures (Signed)
[place]. [facility]  Patient:    ELYANAH, FARINO                      MRN: [id number] Proc. Date: 11/01/99 Adm. Date:  [id number] Attending:  Randolm Idol CC:         Anesthesia Department   Procedure Report  PROCEDURE:  Epidural catheter placement for postoperative pain relief.  ANESTHESIOLOGIST:  Kaylyn Layer. Michelle Piper, M.D.  INDICATIONS:  I was consulted by Dr. Norlene Campbell to place an epidural catheter into Ms. Tonne for postoperative pain relief.  I had the opportunity to see preoperatively to discuss with Ms. Eppard the risks and benefits of the procedure and she agreed to go ahead catheter placement and epidural infusion.  DESCRIPTION OF PROCEDURE:  At the end of the procedure prior to emerging from anesthesia, the patient was turned to the left lateral decubitus position. The back was prepped with Betadine.  Using a #17 Tuohy needle, the epidural space was easily cannulated at the L2-L3 interspace in the midline using a loss of resistance technique.  The catheter was then inserted 5 cm into the epidural space and the needle removed.  The catheter was firmly affixed to the patients back with a negative aspiration, 100 mcg of fentanyl and 10 cc of 0.50% Xylocaine was injected. The patient was then turned supine, extubated and taken to the recovery room in stable condition.  A fentanyl/Marcaine infusion will be begun in the PACU and she will be followed on the floor by the anesthesiology service. DD:  11/01/99 TD:  11/02/99 Job: [id number]

## 2010-07-06 NOTE — Discharge Summary (Signed)
[place]. [facility]  Patient:    [name], [name]                      MRN: [id number] Adm. Date:  [id number] Disc. Date: [id number] Attending:  Randolm Idol Dictator:   Jamelle Rushing, P.A.                           Discharge Summary  DATE OF BIRTH:  05-13-1950  ADMISSION DIAGNOSES: 1. End-stage osteoarthritis left knee. 2. Chronic anemia secondary to gastric bypass and B12 deficiency. 3. Panic disorder. 4. Irritable bowel syndrome. 5. History of hepatitis.  DISCHARGE DIAGNOSES: 1. Status post left total knee arthroplasty. 2. Hypokalemia. 3. Panic disorder. 4. Irritable bowel syndrome. 5. History of hepatitis.  HISTORY OF PRESENT ILLNESS:  The patient is a 60 year old white female with a history of left knee problems since 1983.  The patient had an arthroscopic evaluation of her left knee in 1983, which did give her significant improvement until 1999.  In 1999, the second scope noted bone-on-bone, and the patient did not get as much improvement from the second arthroscopy.  The patient states the pain currently is a constant aching sensation with swelling when she is on her feet for long periods of time.  The patient does have popping and grinding in the knee.  The pain worsens with the amount of time and activity she does on her feet.  The pain does radiate up into the thigh, into the hip, and then down the anterior aspect of the tibia.  The patient currently does not use any assistive device for ambulation.  ALLERGIES:  VIOXX, CODEINE.  The patient states she has had significant problems in the past on two separate occasions many years apart with ABSORBABLE SUTURES causing a significant infection.  The patient was instructed not to have any surgical procedures done in the future with absorbable sutures.  CURRENT MEDICATIONS: 1. Norpramin 300 mg p.o. q.p.m. 2. Xanax 0.25 mg p.o. t.i.d. p.r.n., and 0.5 mg p.o. q.h.s. 3. Premarin  1.25 mg p.o. q.a.m. 4. Triamterene/hydrochlorothiazide 37.5/25 mg p.o. q.a.m. 5. Claritin-D 1 tablet p.o. b.i.d. 6. Prilosec 20 mg p.o. q.d. 7. Iron 325 mg p.o. t.i.d.  PROCEDURES:  On November 01, 1999, the patient was taken to the OR by Dr. Norlene Campbell, assisted by Jamelle Rushing, P.A.C.  Under general anesthesia, the patient had a left total knee replacement performed.  The patient tolerated the procedure well.  Had one Hemovac drain left in place. There were no complications.  The patient did have a postoperative epidural catheter placed by anesthesia department for postoperative pain relief.  This was accomplished without any complications, and the patient was transferred to the recovery room in good condition.  CONSULTATIONS: 1. On November 01, 1999, anesthesia was consulted for epidural catheter    placement for postoperative analgesia. 2. On November 01, 1999, the following routine consults were requested:    a. Physical therapy.    b. Occupational therapy.    c. Rehabilitation.    d. Care management.    e. Pharmacy for Coumadin dosing for routine DVT prophylaxis.  HOSPITAL COURSE:  On November 01, 1999, the patient was admitted to [facility] under the care of Dr. Norlene Campbell.  The patient was taken to the OR and had a left total knee arthroplasty performed without any complications.  The patient tolerated the procedure well  and was transferred to the recovery room and then to the orthopedic floor in good condition.  The patient proceeded to have a five-day postoperative course without any significant complications or problems.  The patient did have some slight hypokalemia at 2.9 on postoperative day #1, and it did improve to 3.3 on November 03, 1999, after having oral replacement.  Otherwise, the patient progressed nicely with physical therapy after initial slow start.  On postoperative day #5, the patient was up to 65 degrees.  The patients  pain was well controlled on Vicodin.  Her left leg remained neuromotor vascularly intact.  Her wound remained benign, and her panic disorder remained well controlled during her hospitalization.  After evaluation with physical therapy, it was felt that the patient would do very well at home with home health physical therapy and an R.N. for Coumadin evaluation and a CPM for home use.  These arrangements were made, and the patient was discharged on postoperative day #5.  LABORATORY DATA:  On November 04, 1999, WBC was 4.7, hemoglobin 9.6, hematocrit 27.2, platelets of 126.  On November 06, 1999, PT was 17.6 with a 1.8 INR.  On November 03, 1999, routine chemistries were 130 sodium, potassium of 3.3, glucose 127, BUN 5, and calcium 7.6.  DISCHARGE MEDICATIONS:  1. Colace 100 mg p.o. b.i.d.  2. Norpramin 300 mg p.o. q.h.s.  3. Xanax 0.5 mg p.o. q.h.s.  4. Premarin 1.25 mg p.o. q.d.  5. Triamterene/hydrochlorothiazide 1 tablet p.o. q.d.  6. Protonix 40 mg p.o. q.d.  7. Ferrous sulfate 325 mg p.o. t.i.d.  8. Claritin 10 mg p.o. q.d.  9. Sudafed 120 mg p.o. q.12h. 10. Potassium chloride 20 mEq p.o. b.i.d., discontinued on discharge. 11. Coumadin per pharmacy dosing. 12. Robaxin 500 mg p.o. q.4-6h. p.r.n. muscle spasms. 13. Alprazolam 0.25 mg p.o. t.i.d. p.r.n. 14. Vicodin 1 or 2 tablets every four to six hours p.r.n. pain.  DISCHARGE INSTRUCTIONS:  1. Medications:  The patient is to resume all routine medications.     a. Vicodin 1 or 2 tablets every four to six hours p.r.n. pain, dispensed        40.     b. Robaxin 500 mg tablets, 1 tablet four times a day if needed for muscle        spasms, dispensed 20 with 1 refill.  2. Activity:  With 50% body weight on left leg only.  The patient is to use     walker and leg immobilizer when ambulating or standing.  3. Wound care:  The patient is to keep wound clean and dry.  Check daily for     infection.  4. Follow-up:  The patient is to  call [phone] for a follow-up appointment     with Dr. Cleophas Dunker at the end of next week.   CONDITION ON DISCHARGE:  Good. DD:  11/06/99 TD:  11/07/99 Job: [id number]

## 2010-07-06 NOTE — Op Note (Signed)
[place]  Patient:    [name], [name]                      MRN: [id number] Proc. Date: 04/24/00 Adm. Date:  [id number] Attending:  Charlton Haws CC:         Sibyl Parr. Darrick Penna, M.D.  Darci Needle, M.D.   Operative Report  CCS# [id number]  PREOPERATIVE DIAGNOSIS:  Chronic calculus cholecystitis.  POSTOPERATIVE DIAGNOSIS:  Chronic calculus cholecystitis.  OPERATION PERFORMED:  Laparoscopic cholecystectomy.  SURGEON:  Currie Paris, M.D.  ASSISTANT:  Rose Phi. Maple Hudson, M.D.  ANESTHESIA:  General endotracheal.  INDICATIONS FOR PROCEDURE:  The patient is a 60 year old female who initially had an episode of pain which on evaluation proved to be biliary in origin. She was known to have gallstones.  Follow this work-up she was scheduled for a laparoscopic cholecystectomy with the thought that she might need to be done open because of multiple prior abdominal procedures including gastric bypass.  DESCRIPTION OF PROCEDURE:  The patient was brought to the operating room and after satisfactory general endotracheal anesthesia had been obtained, the abdomen was prepped and draped.  I began by making an incision above the umbilicus, dividing the subcutaneous tissues until I could identify the fascia which was picked up on either side of the midline with Kochers and opened with the knife.  I could see peritoneal fat and using some blunt dissection with my finger, heading into the left upper quadrant, was able to free a few omental adhesions down and enter free right upper quadrant.  A pursestring suture of 2-0 Prolene was placed and a Hasson introduced into the abdomen and insufflated to 15.  The camera was placed.  Although there was a line of adhesions in the upper midline and some around the umbilicus, the remainder of the abdomen was remarkably free of adhesions.  The patient was placed in reversed Trendelenburg position and I used local  for the rest of the incisions.  I placed a trocar in the high epigastrium and two laterally.  The gallbladder was grasped and retracted over the liver and the triangle of Calot opened up.  I could see the cystic duct and its junction with the common duct and gallbladder.  It was clipped twice on the stay side and once on the gallbladder side and divided. Since the artery was just behind it, it was dissected out and clipped twice on the stay side, once on the go side and divided.  The gallbladder was removed from below to above the coagulation current of the cautery.  Just prior to removing it, we irrigated to make sure everything was dry and then disconnected the gallbladder and brought it out the umbilical port.  That port was occluded with my finger while we reinsufflated and made a final check for hemostasis and again, everything appeared to be dry.  Excess irrigant was suctioned out.  Lateral ports were removed and were dry.  The umbilical port was closed with the pursestring and some Marcaine injected for postoperative analgesia.  Because the patient has a questionable allergy to absorbable suture, I used staples to close the incisions rather than subcuticular sutures.  The patient tolerated the procedure well.  There were no operative complications.  All counts were correct. DD:  04/24/00 TD:  04/24/00 Job: [id number]

## 2010-07-18 ENCOUNTER — Encounter: Payer: Self-pay | Admitting: Family Medicine

## 2010-07-26 ENCOUNTER — Other Ambulatory Visit: Payer: Self-pay | Admitting: Family Medicine

## 2010-08-07 ENCOUNTER — Encounter: Payer: Self-pay | Admitting: Family Medicine

## 2010-08-07 ENCOUNTER — Emergency Department (HOSPITAL_COMMUNITY): Payer: 59

## 2010-08-07 ENCOUNTER — Inpatient Hospital Stay (HOSPITAL_COMMUNITY)
Admission: EM | Admit: 2010-08-07 | Discharge: 2010-08-08 | DRG: 313 | Disposition: A | Discharge status: Home / Self Care | Payer: 59 | Attending: Family Medicine | Admitting: Family Medicine

## 2010-08-07 DIAGNOSIS — I10 Essential (primary) hypertension: Secondary | ICD-10-CM | POA: Diagnosis present

## 2010-08-07 DIAGNOSIS — E785 Hyperlipidemia, unspecified: Secondary | ICD-10-CM | POA: Diagnosis present

## 2010-08-07 DIAGNOSIS — R0789 Other chest pain: Principal | ICD-10-CM | POA: Diagnosis present

## 2010-08-07 DIAGNOSIS — F411 Generalized anxiety disorder: Secondary | ICD-10-CM

## 2010-08-07 DIAGNOSIS — E669 Obesity, unspecified: Secondary | ICD-10-CM | POA: Diagnosis present

## 2010-08-07 DIAGNOSIS — J984 Other disorders of lung: Secondary | ICD-10-CM | POA: Diagnosis present

## 2010-08-07 DIAGNOSIS — K219 Gastro-esophageal reflux disease without esophagitis: Secondary | ICD-10-CM | POA: Diagnosis present

## 2010-08-07 DIAGNOSIS — R0609 Other forms of dyspnea: Secondary | ICD-10-CM | POA: Diagnosis present

## 2010-08-07 DIAGNOSIS — D649 Anemia, unspecified: Secondary | ICD-10-CM | POA: Diagnosis present

## 2010-08-07 DIAGNOSIS — Z9884 Bariatric surgery status: Secondary | ICD-10-CM

## 2010-08-07 DIAGNOSIS — R0602 Shortness of breath: Secondary | ICD-10-CM

## 2010-08-07 DIAGNOSIS — R0989 Other specified symptoms and signs involving the circulatory and respiratory systems: Secondary | ICD-10-CM | POA: Diagnosis present

## 2010-08-07 LAB — CBC
Hemoglobin: 11.9 g/dL — ABNORMAL LOW (ref 12.0–15.0)
MCH: 27.3 pg (ref 26.0–34.0)
Platelets: 211 10*3/uL (ref 150–400)
RBC: 4.36 MIL/uL (ref 3.87–5.11)
WBC: 5.1 10*3/uL (ref 4.0–10.5)

## 2010-08-07 LAB — DIFFERENTIAL
Basophils Relative: 0 % (ref 0–1)
Eosinophils Absolute: 0 10*3/uL (ref 0.0–0.7)
Eosinophils Relative: 1 % (ref 0–5)
Lymphs Abs: 2.9 10*3/uL (ref 0.7–4.0)
Monocytes Relative: 12 % (ref 3–12)
Neutrophils Relative %: 30 % — ABNORMAL LOW (ref 43–77)

## 2010-08-07 LAB — CK TOTAL AND CKMB (NOT AT ARMC)
CK, MB: 14.9 ng/mL (ref 0.3–4.0)
Relative Index: 3.9 — ABNORMAL HIGH (ref 0.0–2.5)
Total CK: 379 U/L — ABNORMAL HIGH (ref 7–177)

## 2010-08-07 LAB — COMPREHENSIVE METABOLIC PANEL
ALT: 51 U/L — ABNORMAL HIGH (ref 0–35)
AST: 36 U/L (ref 0–37)
Albumin: 3.4 g/dL — ABNORMAL LOW (ref 3.5–5.2)
Calcium: 9.3 mg/dL (ref 8.4–10.5)
Creatinine, Ser: 0.47 mg/dL — ABNORMAL LOW (ref 0.50–1.10)
GFR calc non Af Amer: >60 mL/min (ref >60)
Sodium: 139 mEq/L (ref 135–145)
Total Protein: 6.9 g/dL (ref 6.0–8.3)

## 2010-08-07 LAB — TROPONIN I: Troponin I: <0.3 ng/mL (ref <0.30)

## 2010-08-07 MED ORDER — IOHEXOL 300 MG/ML  SOLN
100.0000 mL | Freq: Once | INTRAMUSCULAR | Status: AC | PRN
  Administered 2010-08-07: 100 mL via INTRAVENOUS

## 2010-08-07 NOTE — Progress Notes (Signed)
Family Medicine Teaching [facility] Admission History and Physical  Patient name: [name] Medical record number: [id number] Date of birth: 05-21-1950 Age: 60 y.o. Gender: female  Primary Care Provider: Denny Levy, MD, MD  Chief Complaint: chest pain  History of Present Illness: [name] is a 60 y.o. year old female presenting with intermittent CP x 1 month.  This pain comes on both at rest and with exertion.  IF it comes with exertion it seems to improve with rest.  She also notes that she has some chronic DOE that may have been getting worse the last month.  Pt yesterday noted a difference in intensity of the pain.  It is on her left side and is described as crampy.  She says that it radiates to her back.  She does not get diaphoretic with this.  She denies cough, fever, chills, increased leg swelling, palpitations.  Pt has GERD but thinks this is different than those symptoms.  Pt states that her symptoms went away yesterday.  Today she felt fine until 6pm, when she had the symptoms again.  Since her stomach began to hurt at the same time, she thought it was related to GERD.  Tums didn't help so she came to the ED>  Patient Active Problem List  Diagnoses  . OBESITY, NOS  . PANIC ATTACKS  . ESSENTIAL HYPERTENSION, BENIGN  . GASTROESOPHAGEAL REFLUX, NO ESOPHAGITIS  . CHOLELITHIASIS, NOS  . VAGINITIS, ATROPHIC  . ARTHRALGIA, UNSPECIFIED  . ROTATOR CUFF SYNDROME, RIGHT  . METATARSALGIA  . PALPITATIONS  . KNEE REPLACEMENT, LEFT, HX OF   Past Medical History: Past Medical History  Diagnosis Date  . Hypertension   . Anxiety   . GERD (gastroesophageal reflux disease)   . Anemia     Past Surgical History: Past Surgical History  Procedure Date  . Cholecystectomy   . Gastric bypass   . Tubal ligation   . Abdominal hysterectomy   . Joint replacement   . Hernia repair     Social History: History   Social History  . Marital Status: Married    Spouse Name: N/A    Number of Children: N/A  . Years of Education: N/A   Social History Main Topics  . Smoking status: Never Smoker   . Smokeless tobacco: None  . Alcohol Use: No  . Drug Use: No  . Sexually Active:    Other Topics Concern  . None   Social History Narrative  . None    Family History: Family History  Problem Relation Age of Onset  . Depression Mother   . Deep vein thrombosis Father     Allergies: Allergies  Allergen Reactions  . Loratadine     REACTION: unspecified    Current Outpatient Prescriptions  Medication Sig Dispense Refill  . ALPRAZolam (XANAX) 0.5 MG tablet TAKE 1 TABLET BY MOUTH THREE TIMES DAILY  90 tablet  5  . benazepril (LOTENSIN) 10 MG tablet Take 10 mg by mouth daily.        . cromolyn (OPTICROM) 4 % ophthalmic solution Place 1-2 drops into both eyes 3 (three) times daily as needed.        . diclofenac (VOLTAREN) 75 MG EC tablet Take 75 mg by mouth 2 (two) times daily as needed. As needed for joint pain       . esomeprazole (NEXIUM) 20 MG capsule Take 1 capsule (20 mg total) by mouth daily.  90 capsule  3  . fluconazole (DIFLUCAN)  100 MG tablet Take 100 mg by mouth once a week.        . fluticasone (FLONASE) 50 MCG/ACT nasal spray 1-2 sprays by Nasal route daily.        . furosemide (LASIX) 20 MG tablet Take 20 mg by mouth daily.        Marland Kitchen tolterodine (DETROL) 2 MG tablet Take 2 mg by mouth 2 (two) times daily.        . traMADol (ULTRAM) 50 MG tablet Take 50 mg by mouth daily.        . valACYclovir (VALTREX) 500 MG tablet Take 500 mg by mouth 2 (two) times daily. For three days at each outbreak       . venlafaxine (EFFEXOR XR) 150 MG 24 hr capsule Take 150 mg by mouth daily. Brand name please        Review Of Systems: Per HPI with the following additions: denies diarrhea, complains of some stomach pain. Otherwise 12 point review of systems was performed and was unremarkable.  Physical Exam: Pulse: 87  Blood Pressure: 138/71 RR: 21   O2: 97 on RA Temp:  97.7  General: alert, cooperative, appears stated age, no distress and mildly obese HEENT: PERRLA, extra ocular movement intact, sclera clear, anicteric, oropharynx clear, no lesions, neck supple with midline trachea and thyroid without masses Heart: S1, S2 normal, no murmur, rub or gallop, regular rate and rhythm Lungs: clear to auscultation, no wheezes or rales and unlabored breathing Abdomen: abdomen is soft without significant tenderness, masses, organomegaly or guarding Extremities: extremities normal, atraumatic, no cyanosis or edema and varicose veins noted Skin:no rashes, no ecchymoses, no jaundice Neurology: normal without focal findings, mental status, speech normal, alert and oriented x3 and PERLA  Labs and Imaging:  Lab Results  Component Value Date   WBC 5.1 08/07/2010   HGB 11.9* 08/07/2010   HCT 35.7* 08/07/2010   MCV 81.9 08/07/2010   PLT 211 08/07/2010   Lab Results  Component Value Date   CKTOTAL 379* 08/07/2010   TROPONINI <0.30 08/07/2010  CKMB 14.9   Chemistry      Component Value Date/Time   NA 139 08/07/2010 1905   K 3.9 08/07/2010 1905   CL 103 08/07/2010 1905   CO2 27 08/07/2010 1905   BUN 16 08/07/2010 1905   CREATININE 0.47* 08/07/2010 1905      Component Value Date/Time   CALCIUM 9.3 08/07/2010 1905   ALKPHOS 109 08/07/2010 1905   AST 36 08/07/2010 1905   ALT 51* 08/07/2010 1905   BILITOT 0.2* 08/07/2010 1905      CXR negative EKG no change from previous  Assessment and Plan: [name] is a 60 y.o. year old female presenting with chest pain x 1 month, worse the last 2 days.   1. Chest pain:  Will admit for r/o, check troponin 12 hours post onset of pain, 6am.  Will check Lipids, TSH, A1C.  Keep on tele overnight.  CKMB is elevated but troponin is negative.  troponins are more sensitive and no changes on EKG so will continue r/o.  Consider GI cause for her symptoms, however will check D Dimer to assess risk of PE in this low risk woman.  Will give GI  cocktail to see if that helps.   2.   HTN: keep on home meds.  BP currently stable. 3.   Anxiety- continue wellbutrin and xanax per home regimen. 4. FEN/GI: Heart healthy diet.  No IVF 5. Prophylaxis: heparin  5000 units sq and protonix 6. Disposition: pending completion of rule out.  May need further risk stratification as an outpatient if she rules out here.

## 2010-08-08 LAB — CARDIAC PANEL(CRET KIN+CKTOT+MB+TROPI)
CK, MB: 15 ng/mL (ref 0.3–4.0)
Relative Index: 4.5 — ABNORMAL HIGH (ref 0.0–2.5)
Total CK: 341 U/L — ABNORMAL HIGH (ref 7–177)
Total CK: 279 U/L — ABNORMAL HIGH (ref 7–177)
Troponin I: <0.3 ng/mL (ref <0.30)
Troponin I: <0.3 ng/mL (ref <0.30)

## 2010-08-08 LAB — LIPID PANEL
Total CHOL/HDL Ratio: 3.5 RATIO
VLDL: 20 mg/dL (ref 0–40)

## 2010-08-08 LAB — CBC
MCH: 27 pg (ref 26.0–34.0)
MCV: 82.3 fL (ref 78.0–100.0)
Platelets: 183 10*3/uL (ref 150–400)
RBC: 4.18 MIL/uL (ref 3.87–5.11)
RDW: 13.3 % (ref 11.5–15.5)
WBC: 3.6 10*3/uL — ABNORMAL LOW (ref 4.0–10.5)

## 2010-08-08 LAB — BASIC METABOLIC PANEL
BUN: 12 mg/dL (ref 6–23)
CO2: 27 mEq/L (ref 19–32)
Calcium: 8.9 mg/dL (ref 8.4–10.5)
Chloride: 106 mEq/L (ref 96–112)
Creatinine, Ser: <0.47 mg/dL — ABNORMAL LOW (ref 0.50–1.10)

## 2010-08-08 LAB — TSH: TSH: 3.752 u[IU]/mL (ref 0.350–4.500)

## 2010-08-08 LAB — HEMOGLOBIN A1C
Hgb A1c MFr Bld: 5.6 % (ref <5.7)
Mean Plasma Glucose: 114 mg/dL (ref <117)

## 2010-08-16 ENCOUNTER — Other Ambulatory Visit (HOSPITAL_COMMUNITY): Payer: Self-pay | Admitting: Interventional Cardiology

## 2010-08-23 ENCOUNTER — Ambulatory Visit (HOSPITAL_COMMUNITY): Payer: 59

## 2010-08-23 ENCOUNTER — Encounter (HOSPITAL_COMMUNITY)
Admission: RE | Admit: 2010-08-23 | Discharge: 2010-08-23 | Disposition: A | Discharge status: Home / Self Care | Payer: 59 | Source: Ambulatory Visit | Attending: Interventional Cardiology | Admitting: Interventional Cardiology

## 2010-08-23 DIAGNOSIS — R079 Chest pain, unspecified: Secondary | ICD-10-CM | POA: Insufficient documentation

## 2010-08-23 MED ORDER — TECHNETIUM TC 99M TETROFOSMIN IV KIT
30.0000 | PACK | Freq: Once | INTRAVENOUS | Status: AC | PRN
  Administered 2010-08-23: 30 via INTRAVENOUS

## 2010-08-23 MED ORDER — TECHNETIUM TC 99M TETROFOSMIN IV KIT
10.0000 | PACK | Freq: Once | INTRAVENOUS | Status: AC | PRN
  Administered 2010-08-23: 10 via INTRAVENOUS

## 2010-08-23 NOTE — Discharge Summary (Signed)
[name], [name] NO.:  [id number]  MEDICAL RECORD NO.:  [id number]  LOCATION:  3743                         FACILITY:  [facility]  PHYSICIAN:  [name], M.D.DATE OF BIRTH:  10/18/1950  DATE OF ADMISSION:  08/07/2010 DATE OF DISCHARGE:  08/08/2010                              DISCHARGE SUMMARY   PRIMARY CARE PROVIDER:  [name], MD, [facility]  REASON FOR HOSPITALIZATION:  Chest pain, rule out ACS, rule out acute coronary syndrome.  DISCHARGE DIAGNOSES: 1. Chest pain.  Unlikely acute coronary syndrome. 2. History of hypertension. 3. History of anxiety. 4. Pulmonary nodule.  CONSULTS:  [facility] was consulted over the phone.  PROCEDURES:  Chest x-ray showed no active cardiopulmonary abnormality. CT angiogram of the chest showed no evidence for acute pulmonary embolus.  Pulmonary nodule in the right upper lobe measuring 6 mm.PERTINENT LABS:  Cardiac enzymes, troponin negative x3.  Fasting lipid panel significant for cholesterol 242 and LDL 153.  TSH and hemoglobin A1c pending.  BRIEF HOSPITAL COURSE:  This is a 60 year old female with a history of hypertension, anxiety, and GERD who presents with chest pain for the past month. 1. Chest pain.  The patient described the chest pain as being     nonexertional, left-sided occasionally radiating to the back and     occasionally associated with dyspnea and alleviated by     nitroglycerin.  She sees cardiologist, Dr. Katrinka Blazing at [facility]     Cardiology.  Her last stress test was about 5 years ago and was     negative.  She started seeing him due to chest pain symptoms     several years ago that ended up being due to her gallbladder.  The     patient's chest pain had resolved by the following day,and she     thinks that the nitroglycerin help the chest pain.  EKGs showed     normal sinus rhythm with no abnormalities.  Cardiac enzymes,     troponin were negative x3, although  her CK-MB was mildly elevated.     Her D-dimer was mildly elevated as well at 0.86; however, her CT     angiogram did not show any signs of pulmonary emboli.  [facility]     Cardiology was consulted.  Over the phone, they recommended a     stress test for the patient while she was in the hospital.  The     patient did not want this done in the hospital.  She insisted on     going home and wanted to follow up as an outpatient.  Cardiologist,     Dr. Eldridge Dace thought it was appropriate for the patient to go home     since her set of cardiac enzymes were negative, but did want to see     her in 1 week to arrange an outpatient stress test.  The patient     was able to ambulate down the hall without any chest pain or     dyspnea. 2. Hypertension.  Stable on her home Lotensin. 3. Anxiety.  Controlled with her home Xanax and Effexor. 4. Pulmonary nodule found on  CT angiogram.  The patient is a     nonsmoker.  She has low probability for bronchogenic carcinoma.     Recommended followup CT in 1 year. 5. Hyperlipidemia.  The patient's LDL was found to be elevated at 153.     With her high blood pressure and obesity, recommending starting     treatment for hyperlipidemia as an outpatient.  DISPOSITION:  The patient was discharged to home in stable medical condition.  DISCHARGE INSTRUCTIONS:  The patient was asked to follow up with her cardiologist, Dr. Katrinka Blazing at [facility] on August 15, 2010, at 11:15 a.m.  The patient was asked to follow up with her primary care doctor, Dr. Jennette Kettle following her appointment with her cardiologist.  The patient was advised of warning signs for acute coronary syndrome including worsening chest pain despite the nitroglycerin, worsening shortness of breath, diaphoresis, nausea, vomiting associated with the chest pain. The patient expressed understanding of these symptoms.  FOLLOWUP ISSUES: 1. Follow up on the patient's chest pain.  The patient will likely     need an  outpatient stress test. 2. Follow up on her pulmonary nodule.  Radiologist is recommending     followup CT in 1 year to monitor progression of the nodule. 3. Hyperlipidemia.  The patient's LDL was elevated at 153.     Recommended starting a statin as an outpatient if her primary care     doctor feels this is appropriate. 4. TSH and hemoglobin A1c were pending at the time of discharge.  MEDICATIONS:  New medications: Nitroglycerin 0.4 mg sublingual q.5 minutes x3 p.r.n.  Continued home medications: 1. Alprazolam 0.25 mg q.a.m. and 0.7 mg at bedtime p.r.n., anxiety. 2. Benazepril 10 mg p.o. daily. 3. Calcium carbonate 1-2 tablets p.o. q.6 h. P.r.n., indigestion. 4. Cromolyn sodium 4% ophthalmic solution 1-2 drops in each eye b.i.d.     p.r.n., eye allergy symptoms. 5. Tolterodine 4 mg p.o. daily. 6. Diclofenac 75 mg p.o. b.i.d. 7. Effexor 150 mg p.o. at bedtime. 8. Fluticasone nasal 50 mcg spray 1-2 sprays each naris b.i.d. p.r.n. 9. Lasix 20 mg p.o. daily. 10.Nexium 20 mg p.o. daily.    ______________________________ Jacqueline Mann, MD   ______________________________ [name], M.D.    AO/MEDQ  D:  08/08/2010  T:  08/09/2010  Job:  [id number]  cc:   Nestor Ramp, MD Lyn Records, M.D.  Electronically Signed by Jacqueline Mann MD on 08/14/2010 03:09:09 PM Electronically Signed by [name] M.D. on 08/23/2010 10:18:34 AM

## 2010-09-10 NOTE — H&P (Signed)
[name], [name] NO.:  [id number]  MEDICAL RECORD NO.:  [id number]  LOCATION:  3743                         FACILITY:  [facility]  PHYSICIAN:  [name], M.D.DATE OF BIRTH:  1951-01-03  DATE OF ADMISSION:  08/07/2010 DATE OF DISCHARGE:                             HISTORY & PHYSICAL   PRIMARY CARE PHYSICIAN:  Jacqueline Ramp, MD at [facility]  CHIEF COMPLAINT:  Chest pain.  HISTORY OF PRESENT ILLNESS:  [name] is a 60 year old female presenting with intermittent chest pain x1 month.  Pain comes on both at rest and with exertion.  If it comes on in exertion, it does seem to improve with rest.  She states that the pain is on the left side of her chest, radiating to the back, lasting about 5 minutes.  She described it as a crampy pain.  It is not usually associated with nausea.  She has not tried nitroglycerin for this pain.  She also notes that she has some chronic dyspnea on exertion that may be getting worse over the last month.  She states that yesterday, she noticed a difference in the intensity of the pain and she was concerned; however, it did away.  She this morning was pain free, went about her day and did errands without any pain.  However, at 6 p.m., she had both the onset of chest pain and the onset of stomach pain.  She was worried that this might be her GERD, so she took several Tums, however, this did not change her symptoms. She says that her symptoms went away by the time she was seen in the ED. She denies cough, fever, chills, increased leg swelling, or palpitations.  PAST MEDICAL HISTORY: 1. Hypertension, 2. Anxiety. 3. GERD. 4. Anemia.  PAST SURGICAL HISTORY: 1. Cholecystectomy. 2. Gastric bypass. 3. Tubal ligation. 4. Abdominal hysterectomy. 5. Joint repair. 6. Hernia repair.  SOCIAL HISTORY:  She is married.  She works at [employer] as a Psychologist, sport and exercise.  She has never smoked.  She does not use drugs or  drink any alcohol.  FAMILY HISTORY:  Mother who died in her 22s of suicide and father who had a DVT/PE.  ALLERGIES:  CLARITIN, VIOXX, BANANAS.  CURRENT MEDICATIONS: 1. Xanax 0.5 mg 1 tablet by mouth 3 times daily. 2. Benazepril 10 mg tablets daily. 3. Cromolyn 4% ophthalmic solution 1-2 drops 3 times a day as needed. 4. Voltaren 75 twice a day as needed. 5. Nexium 20 mg p.o. daily. 6. Flonase 50 mg 1 spray each nostril p.r.n. 7. Lasix 70 mg p.o. daily. 8. Detrol 4 mg p.o. daily. 9. Effexor 150 mg p.o. daily.  REVIEW OF SYSTEMS:  Per HPI with the following additions:  She denies diarrhea, but she does complain of some stomach pain.  Otherwise, 12- point review of systems was performed and unremarkable.  PHYSICAL EXAMINATION:  VITAL SIGNS:  Pulse 87, blood pressure 138/71, respirations 21, O2 sat 97% on room air, temperature 97.7. GENERAL:  She is alert, cooperative, appears stated age, no acute distress, and mildly obese. HEENT:  Pupils are equal, round, and reactive to light.  Extraocular movements intact.  Sclerae are  clear and anicteric.  Oropharynx clear and no lesions. NECK:  Supple with midline trachea and thyroid without masses. HEART:  S1, S2 are normal.  No murmurs, rubs, or gallops.  Regular rate and rhythm. LUNGS:  Clear to auscultation.  No wheezes, no rales, unlabored breathing. ABDOMEN:  Soft without significant tenderness, masses, organomegaly, or guarding. SKIN:  No rashes.  No ecchymoses.  No jaundice. EXTREMITIES:  Normal, atraumatic.  No cyanosis or edema.  Varicose veins are noted. NEUROLOGIC:  Normal without focal findings.  Mental status and seizure normal.  Alert and oriented x3.  LABS AND IMAGING:  CBC was within normal limits except for hemoglobin which is slightly low at 11.9.  CK was 379, CK-MB was 14.9, troponin was negative.  Chemistry was within normal limits except for a mildly elevated ALT at 51.  Chest x-ray with no acute cardiopulmonary  disease. EKG, no change from previous.  ASSESSMENT AND PLAN:  [name] is a 60 year old female presenting with chest pain x1 month, worse last few days. 1. Chest pain.  We will admit her for rule out and keep her overnight     on telemetry.  We will check her troponin 12 hours post onset of     pain at 6 a.m.  We will check lipids, TSH, and A1c in the morning.     CK-MB is currently elevated, but troponins are negative.  We     consider troponins are more sensitive tests and since there are no     changes on EKG, we will continue rule out of her chest pain by     following enzymes and looking for EKG changes considering a     gastrointestinal cause for her symptoms, however, we will check D-     dimer to assess for PE.  I will consider this woman low risk for PE     at this time.  We will give GI cocktail to see if that helps her     symptoms. 2. Hypertension.  Keep on home meds.  Blood pressure currently stable. 3. Anxiety.  Continue Wellbutrin and Xanax per home regimen. 4. Fluids, electrolytes, nutrition/gastrointestinal.  Heart-healthy     diet.  No IV fluids. 5. Prophylaxis.  Heparin 5000 units subcu and Protonix. 6. Disposition.  Pending completion of rule out.  The patient may need     further risk stratification as an outpatient if he rules out here.     Ellery Plunk, MD   ______________________________ [name], M.D.    RS/MEDQ  D:  08/07/2010  T:  08/08/2010  Job:  [id number]  Electronically Signed by Ellery Plunk  on 08/29/2010 11:45:03 AM Electronically Signed by [name] M.D. on 09/10/2010 01:58:56 PM

## 2010-09-19 HISTORY — PX: HALLUX VALGUS CORRECTION: SUR315

## 2010-11-08 LAB — BASIC METABOLIC PANEL
BUN: 1 — ABNORMAL LOW
BUN: 7
BUN: 4 — ABNORMAL LOW
CO2: 30
CO2: 32
CO2: 29
Calcium: 8 — ABNORMAL LOW
Calcium: 8.5
Chloride: 106
Creatinine, Ser: 0.45
Creatinine, Ser: 0.55
GFR calc non Af Amer: >60
GFR calc non Af Amer: >60
Glucose, Bld: 131 — ABNORMAL HIGH
Glucose, Bld: 132 — ABNORMAL HIGH
Glucose, Bld: 105 — ABNORMAL HIGH
Sodium: 141

## 2010-11-08 LAB — CBC
HCT: 25.6 — ABNORMAL LOW
Hemoglobin: 9.8 — ABNORMAL LOW
MCHC: 34
MCHC: 34.4
MCHC: 34.4
MCHC: 34.3
MCV: 86.4
MCV: 85.4
Platelets: 127 — ABNORMAL LOW
Platelets: 145 — ABNORMAL LOW
Platelets: 188
Platelets: 115 — ABNORMAL LOW
Platelets: 156
RDW: 13.5
RDW: 13.7
RDW: 13.6
RDW: 13.8
WBC: 5.3

## 2010-11-08 LAB — COMPREHENSIVE METABOLIC PANEL
ALT: 37 — ABNORMAL HIGH
AST: 36
Albumin: 3.5
Calcium: 9.2
GFR calc Af Amer: >60
Potassium: 3.7
Sodium: 138
Total Protein: 6.9

## 2010-11-08 LAB — TYPE AND SCREEN
ABO/RH(D): A POS
Antibody Screen: NEGATIVE

## 2010-11-08 LAB — PROTIME-INR
INR: 1.8 — ABNORMAL HIGH
Prothrombin Time: 12.9
Prothrombin Time: 15.3 — ABNORMAL HIGH
Prothrombin Time: 21.4 — ABNORMAL HIGH
Prothrombin Time: 22.5 — ABNORMAL HIGH

## 2010-11-08 LAB — URINALYSIS, ROUTINE W REFLEX MICROSCOPIC
Bilirubin Urine: NEGATIVE
Glucose, UA: NEGATIVE
Hgb urine dipstick: NEGATIVE
Ketones, ur: NEGATIVE
Protein, ur: NEGATIVE

## 2010-11-08 LAB — ABO/RH: ABO/RH(D): A POS

## 2010-11-27 ENCOUNTER — Other Ambulatory Visit: Payer: Self-pay | Admitting: Family Medicine

## 2010-11-27 ENCOUNTER — Telehealth: Payer: Self-pay | Admitting: Family Medicine

## 2010-11-27 DIAGNOSIS — Z1231 Encounter for screening mammogram for malignant neoplasm of breast: Secondary | ICD-10-CM

## 2010-11-27 MED ORDER — DICLOFENAC SODIUM 75 MG PO TBEC: 75.0000 mg | DELAYED_RELEASE_TABLET | Freq: Two times a day (BID) | ORAL | Status: DC | PRN

## 2010-11-27 MED ORDER — ALPRAZOLAM 0.5 MG PO TABS: 0.5000 mg | ORAL_TABLET | Freq: Three times a day (TID) | ORAL | Status: DC | PRN

## 2010-11-27 MED ORDER — TOLTERODINE TARTRATE 2 MG PO TABS: 2.0000 mg | ORAL_TABLET | Freq: Two times a day (BID) | ORAL | Status: DC

## 2010-11-27 NOTE — Telephone Encounter (Signed)
Spoke with [name] at outpatient pharmacy and called in rx.[name]

## 2010-11-27 NOTE — Telephone Encounter (Signed)
Dear [name] Team Please call in this alprazolam rx THANKS! Denny Levy

## 2010-12-05 ENCOUNTER — Ambulatory Visit: Payer: 59 | Admitting: Family Medicine

## 2010-12-10 ENCOUNTER — Encounter: Payer: Self-pay | Admitting: Family Medicine

## 2010-12-10 ENCOUNTER — Ambulatory Visit (INDEPENDENT_AMBULATORY_CARE_PROVIDER_SITE_OTHER): Payer: 59 | Admitting: Family Medicine

## 2010-12-10 VITALS — BP 112/78 | HR 85 | Temp 98.2°F | Wt 202.1 lb

## 2010-12-10 DIAGNOSIS — H109 Unspecified conjunctivitis: Secondary | ICD-10-CM

## 2010-12-10 DIAGNOSIS — J069 Acute upper respiratory infection, unspecified: Secondary | ICD-10-CM

## 2010-12-10 MED ORDER — POLYMYXIN B-TRIMETHOPRIM 10000-0.1 UNIT/ML-% OP SOLN: 1.0000 [drp] | Freq: Four times a day (QID) | OPHTHALMIC | Status: DC

## 2010-12-12 ENCOUNTER — Ambulatory Visit (HOSPITAL_COMMUNITY): Payer: 59

## 2010-12-12 ENCOUNTER — Encounter: Payer: Self-pay | Admitting: Family Medicine

## 2010-12-12 ENCOUNTER — Ambulatory Visit (INDEPENDENT_AMBULATORY_CARE_PROVIDER_SITE_OTHER): Payer: 59 | Admitting: Family Medicine

## 2010-12-12 VITALS — BP 120/85 | HR 82 | Temp 98.3°F | Ht 60.0 in | Wt 203.0 lb

## 2010-12-12 DIAGNOSIS — F341 Dysthymic disorder: Secondary | ICD-10-CM

## 2010-12-12 DIAGNOSIS — M7750 Other enthesopathy of unspecified foot: Secondary | ICD-10-CM

## 2010-12-12 DIAGNOSIS — K219 Gastro-esophageal reflux disease without esophagitis: Secondary | ICD-10-CM

## 2010-12-12 DIAGNOSIS — M2550 Pain in unspecified joint: Secondary | ICD-10-CM

## 2010-12-12 DIAGNOSIS — I10 Essential (primary) hypertension: Secondary | ICD-10-CM

## 2010-12-12 DIAGNOSIS — F410 Panic disorder [episodic paroxysmal anxiety] without agoraphobia: Secondary | ICD-10-CM

## 2010-12-12 LAB — BASIC METABOLIC PANEL
Calcium: 8.9 mg/dL (ref 8.4–10.5)
Creat: 0.61 mg/dL (ref 0.50–1.10)
Sodium: 137 mEq/L (ref 135–145)

## 2010-12-12 MED ORDER — DICLOFENAC SODIUM 75 MG PO TBEC: 75.0000 mg | DELAYED_RELEASE_TABLET | Freq: Two times a day (BID) | ORAL | Status: DC | PRN

## 2010-12-12 MED ORDER — VALACYCLOVIR HCL 500 MG PO TABS: 500.0000 mg | ORAL_TABLET | Freq: Two times a day (BID) | ORAL | Status: DC

## 2010-12-12 MED ORDER — VENLAFAXINE HCL ER 150 MG PO CP24: ORAL_CAPSULE | ORAL | Status: DC

## 2010-12-12 MED ORDER — ALPRAZOLAM 0.5 MG PO TABS: 0.5000 mg | ORAL_TABLET | Freq: Three times a day (TID) | ORAL | Status: DC | PRN

## 2010-12-12 MED ORDER — FUROSEMIDE 20 MG PO TABS: ORAL_TABLET | ORAL | Status: DC

## 2010-12-12 MED ORDER — FLUCONAZOLE 100 MG PO TABS: 100.0000 mg | ORAL_TABLET | ORAL | Status: DC

## 2010-12-12 MED ORDER — NYSTATIN 100000 UNIT/GM EX POWD: Freq: Two times a day (BID) | CUTANEOUS | Status: AC

## 2010-12-12 NOTE — Progress Notes (Signed)
  Subjective:    Patient ID: [name], female    DOB: 04/08/1950, 60 y.o.   MRN: [id number]  HPI 1. ?Conjunctivits: Noticed L eye was reddened yesterday.  Began having discharge yesterday as well.  Eye was matted shut this morning.  Discharge described as clear/yellow.  Eye feels mildly irritated.  No vision changes or buring in eye. Currently is on opticrom and zyrtec which has not helped.   2. URI:  Has had sore throat and congestion since last Friday.  She has had a mild cough and nausea associated with this. Does have mild sinus pressure but Denies diarrhea, fever, chills, headache.     Review of Systems     Objective:   Physical Exam  Constitutional: She appears well-developed and well-nourished. No distress.  HENT:  Head: Normocephalic and atraumatic.  Nose: Mucosal edema and rhinorrhea present. Right sinus exhibits no maxillary sinus tenderness and no frontal sinus tenderness. Left sinus exhibits no maxillary sinus tenderness and no frontal sinus tenderness.  Mouth/Throat: Uvula is midline, oropharynx is clear and moist and mucous membranes are normal.  Eyes: EOM are normal. Pupils are equal, round, and reactive to light. Right eye exhibits no discharge. Left eye exhibits discharge. No foreign body present in the left eye. Right conjunctiva is not injected. Left conjunctiva is injected.          Assessment & Plan:

## 2010-12-12 NOTE — Progress Notes (Signed)
Subjective:    Patient ID: [name], female    DOB: 1950-08-05, 60 y.o.   MRN: [id number]  HPI  #1. Followup hypertension: Doing well, no chest pains, no shortness of breath. He is still having some lower showed a swelling even though she is wearing her support meds. She took an extra Lasix on a couple of days and that really seemed to help. At the end of the day is standing she is having ankle and foot swelling. This goes down overnight but it's quite painful when it is  Swollen.  2. Followup chronic depression/dysthymia. She has done well on the Effexor work. She has been taking the brand name drug. Her benefits have changed and she would like to retry the generic. Previously in the past the generic did not seem to help as much. She denies any suicidal or homicidal ideation. She's not having any episodes of tearfulness, no episodes of anger. She does have intermittent sadness but this is essentially unchanged over the last 2-3 years. She has been taking her alprazolam regularly and that really seems to help with her panic issues. She is working full time, taking care of her household and her husband. No insomnia. Appetite is stable. She is not exercising regularly.  #3. GERD: Stable on her current dose of Nexium without any breakthrough symptoms.  #4. Arthralgias: The diclofenac seems to really be helping. She tried stopping it for a few days and noticed increase in joint pains generally.  #5. Intertrigo: She has tried some fluconazole powder and that seemed to work better than the cream. She would like to switch.  Review of Systems  Constitutional: Positive for fatigue. Negative for activity change, appetite change and unexpected weight change.  HENT: Negative for neck pain.   Eyes: Negative for pain.  Respiratory: Negative for cough, chest tightness and shortness of breath.   Cardiovascular: Positive for palpitations and leg swelling. Negative for chest pain.  Gastrointestinal:  Negative for abdominal pain and blood in stool.  Genitourinary: Negative for dysuria, urgency and vaginal discharge.  Musculoskeletal: Positive for arthralgias. Negative for joint swelling.  Neurological: Negative for dizziness, weakness and headaches.  Psychiatric/Behavioral: Negative for confusion and agitation.       Objective:   Physical Exam  Vital signs reviewed. GENERAL: Well developed, well nourished, no acute distress HEENT: Conjunctiva is nonicteric. Pupils equal round reactive to light. Extraocular muscles intact. NE CK: Full range of motion, no lymphadenopathy, no thyromegaly. No carotid bruits. CV regular rate and rhythm no murmur gallop or rub.  LUNGS: Clear to auscultation bilaterally without rales or wheeze. Normal respiratory effort. ABDOMEN: Soft positive bowel sounds BREASTS: Mild fibrocystic changes no worrisome masses. Breasts are symmetrical. Normal arterial and nipple. No axillary masses. Psych: Alert and oriented x4. Interactive. Asks and answers questions appropriate. Normal thought content normal speech content. MSK: Full range of motion in all major joints. Well-healed left knee replacement scar. Normal muscle strength is symmetrical. Rises easily from a chair without assistance.      Assessment & Plan:  #1. Hypertension. Well controlled. Check BMP today continue current medications. #2. Anxiety and depression. Currently stable. We'll try switching her to the generic brand of effexor; if she is noticing a difference we could increase her daily to 225 mg dose.. We also talked about possibly switching to Celexa. She has had some side effects from both Paxil and Zoloft. She was briefly on Celexa in does not recall having any problems with it. #3. Lower extremity  swelling. She is currently wearing good support hose daily. We will try going to 40 mg a day of Lasix and recheck creatinine and potassium in one month. Alternatively we can alternate 20 and 40 #4.  Intertrigo. We'll switch her to the nystatin powder as that seemed to work for her better. She continues to use prophylactic Diflucan once a week. #5. GERD: Continue her current proton pump inhibitor. #6. Arthralgias: Continue diclofenac. He seems to be working well for her. Another reason to continue her proton pump inhibitor unchanged. #7. Preventive medicine. Her mammogram is scheduled, we did her breast exam today. She is status post hysterectomy and bilateral salpingo-oophorectomy and therefore does not need a Pap smear or pelvic. She probably would benefit from external exam and we will do that next office visit. I did discuss calcium and vitamin D with her. Also discussed regular exercise. She's gotten her flu shot. Her colonoscopy was normal  Health Maintenance  Topic Date Due  . Mammogram  08/25/2011  . Influenza Vaccine  11/19/2011  . Tetanus/tdap  04/19/2015  . Colonoscopy  07/27/2017

## 2010-12-14 ENCOUNTER — Encounter: Payer: Self-pay | Admitting: Family Medicine

## 2010-12-16 DIAGNOSIS — J069 Acute upper respiratory infection, unspecified: Secondary | ICD-10-CM | POA: Insufficient documentation

## 2010-12-16 DIAGNOSIS — H109 Unspecified conjunctivitis: Secondary | ICD-10-CM | POA: Insufficient documentation

## 2010-12-16 NOTE — Assessment & Plan Note (Signed)
  Symptomatic therapy suggested: push fluids, rest, use acetaminophen, antihistamine-decongestant of choice, cough suppressant of choice prn and return office visit prn if symptoms persist or worsen. Lack of antibiotic effectiveness discussed with her. Call or return to clinic prn if these symptoms worsen or fail to improve as anticipated.

## 2010-12-16 NOTE — Assessment & Plan Note (Signed)
Likely viral conjunctivitis given other signs and symptoms of URI.  However there is a chance that this could be bacterial, so will go ahead and cover with antibiotic drops.  Given red flags.

## 2010-12-18 ENCOUNTER — Ambulatory Visit (HOSPITAL_COMMUNITY)
Admission: RE | Admit: 2010-12-18 | Discharge: 2010-12-18 | Disposition: A | Discharge status: Home / Self Care | Payer: 59 | Source: Ambulatory Visit | Attending: Family Medicine | Admitting: Family Medicine

## 2010-12-18 DIAGNOSIS — Z1231 Encounter for screening mammogram for malignant neoplasm of breast: Secondary | ICD-10-CM | POA: Insufficient documentation

## 2011-01-22 ENCOUNTER — Other Ambulatory Visit: Payer: 59

## 2011-01-22 DIAGNOSIS — I10 Essential (primary) hypertension: Secondary | ICD-10-CM

## 2011-01-22 LAB — LIPID PANEL
HDL: 67 mg/dL (ref >39)
LDL Cholesterol: 157 mg/dL — ABNORMAL HIGH (ref 0–99)
Total CHOL/HDL Ratio: 3.7 Ratio
Triglycerides: 116 mg/dL (ref <150)

## 2011-01-22 LAB — BASIC METABOLIC PANEL
CO2: 31 mEq/L (ref 19–32)
Chloride: 103 mEq/L (ref 96–112)
Creat: 0.53 mg/dL (ref 0.50–1.10)
Potassium: 4.8 mEq/L (ref 3.5–5.3)
Sodium: 138 mEq/L (ref 135–145)

## 2011-01-22 NOTE — Progress Notes (Signed)
BMP AND FLP DONE TODAY [name] 

## 2011-01-23 ENCOUNTER — Encounter: Payer: Self-pay | Admitting: Family Medicine

## 2011-02-04 ENCOUNTER — Telehealth: Payer: Self-pay | Admitting: Family Medicine

## 2011-02-04 NOTE — Telephone Encounter (Signed)
Patient has been around her granddaughter but is currently not having any symptoms.  Told her that we did not advise Tamiflu if she was currently asymptomatic but if she does develop headache, chills, fever, etc. to call us back.  Patient agreeable.

## 2011-02-04 NOTE — Telephone Encounter (Signed)
Wants to know if her granddaughter has been confirmed with the flu, should she and her husband get started on Tamiflu.

## 2011-02-08 NOTE — Telephone Encounter (Signed)
Pt called back and is now experiencing body aches/fever/slight cough.  Was told that if she experienced any of those sx that Tamiflu could be called in. Out Pt Pharm.

## 2011-02-08 NOTE — Telephone Encounter (Addendum)
Spoke with patient and she  States she has had a headache off and on this week, throat is a little scratchy, she has some achiness in joints and muscles today. She sounds in good spirits over the phone. States she has to work this weekend and is paranoid  about being sick. .  Denies any fever. Advised that the symptoms she is describing do not sound consistent with flu . Advised if throat continues being sore should be checled for strep. States she has had strep before and  discomfort not like that. Advised she would need to be seen before Tamiflu can be prescribed.

## 2011-04-05 ENCOUNTER — Telehealth: Payer: Self-pay | Admitting: Family Medicine

## 2011-04-05 MED ORDER — VENLAFAXINE HCL ER 75 MG PO CP24: 75.0000 mg | ORAL_CAPSULE | Freq: Every day | ORAL | Status: DC

## 2011-04-05 NOTE — Telephone Encounter (Signed)
Dear [name] Team I have called in an additional rx---the ONLY way to  Dose this is a 150 tab plus a 75 mg tab---it will be two rx, probably 2 co-pays---that is insurance not my choice---THIS 225 mg is max dose. She needs to make appt w me in 4 weeks or so THANKS! Denny Levy

## 2011-04-05 NOTE — Telephone Encounter (Signed)
Is asking Dr Jennette Kettle to increase the effexor - still has breakthrough problems Out Pt. Pharm

## 2011-04-05 NOTE — Telephone Encounter (Signed)
Informed pt appt made for 03.20.2013 @ 845 w/Dr. Dione Booze

## 2011-05-06 ENCOUNTER — Other Ambulatory Visit: Payer: Self-pay | Admitting: Family Medicine

## 2011-05-06 MED ORDER — FUROSEMIDE 20 MG PO TABS: ORAL_TABLET | ORAL | Status: DC

## 2011-05-08 ENCOUNTER — Ambulatory Visit (INDEPENDENT_AMBULATORY_CARE_PROVIDER_SITE_OTHER): Payer: 59 | Admitting: Family Medicine

## 2011-05-08 ENCOUNTER — Encounter: Payer: Self-pay | Admitting: Family Medicine

## 2011-05-08 VITALS — BP 141/85 | HR 75 | Temp 97.6°F | Ht 60.0 in | Wt 211.0 lb

## 2011-05-08 DIAGNOSIS — F341 Dysthymic disorder: Secondary | ICD-10-CM

## 2011-05-08 DIAGNOSIS — R5383 Other fatigue: Secondary | ICD-10-CM

## 2011-05-08 DIAGNOSIS — I10 Essential (primary) hypertension: Secondary | ICD-10-CM

## 2011-05-08 DIAGNOSIS — R5381 Other malaise: Secondary | ICD-10-CM

## 2011-05-08 LAB — VITAMIN B12: Vitamin B-12: 1699 pg/mL — ABNORMAL HIGH (ref 211–911)

## 2011-05-08 MED ORDER — FLUCONAZOLE 150 MG PO TABS: ORAL_TABLET | ORAL | Status: DC

## 2011-05-08 MED ORDER — CYANOCOBALAMIN 1000 MCG/ML IJ SOLN
1000.0000 ug | Freq: Once | INTRAMUSCULAR | Status: AC
  Administered 2011-05-08: 1000 ug via INTRAMUSCULAR

## 2011-05-08 MED ORDER — TERBINAFINE HCL 1 % EX SOLN: CUTANEOUS | Status: DC

## 2011-05-09 NOTE — Assessment & Plan Note (Signed)
Elevated a bit today---? From increased dose of effexor? She will monitor and send me some readings in 3 weeks.

## 2011-05-09 NOTE — Progress Notes (Signed)
  Subjective:    Patient ID: MIKELL CAMP, female    DOB: [birth year], 61 y.o.   MRN: [id number]  HPI  F/u increase in effexor---improved emotional lability and depressive symptoms. Still not where she wants to be but better. Lots of financial stress at home--improving and on track to get much better in fall of this year. Denies SI/HI.  2. HTN: taking BP meds ok and no chest pains or sob.  3. Some increased fatigue---has been reading and wonders if her B12 level is low.   Review of Systems     Objective:   Physical Exam  Vital signs reviewed. GENERAL: Well-developed, well-nourished, no acute distress. CARDIOVASCULAR: Regular rate and rhythm no murmur gallop or rub LUNGS: Clear to auscultation bilaterally, no rales or wheeze. ABDOMEN: Soft positive bowel sounds NEURO: No gross focal neurological deficits. MSK: Movement of extremity x 4.        Assessment & Plan:

## 2011-05-09 NOTE — Assessment & Plan Note (Signed)
Panic symptoms slightly better on increased effexor

## 2011-05-14 ENCOUNTER — Encounter: Payer: Self-pay | Admitting: Family Medicine

## 2011-05-19 ENCOUNTER — Other Ambulatory Visit: Payer: Self-pay | Admitting: Family Medicine

## 2011-05-19 MED ORDER — TERBINAFINE 1 % EX GEL: CUTANEOUS | Status: DC

## 2011-07-03 ENCOUNTER — Ambulatory Visit (INDEPENDENT_AMBULATORY_CARE_PROVIDER_SITE_OTHER): Payer: 59 | Admitting: Family Medicine

## 2011-07-03 ENCOUNTER — Encounter: Payer: Self-pay | Admitting: Family Medicine

## 2011-07-03 VITALS — BP 108/70 | HR 78 | Temp 97.9°F | Ht 60.0 in | Wt 207.0 lb

## 2011-07-03 DIAGNOSIS — I10 Essential (primary) hypertension: Secondary | ICD-10-CM

## 2011-07-03 MED ORDER — ALPRAZOLAM 0.5 MG PO TABS: ORAL_TABLET | ORAL | Status: DC

## 2011-07-03 MED ORDER — BENAZEPRIL HCL 10 MG PO TABS: 20.0000 mg | ORAL_TABLET | Freq: Every day | ORAL | Status: DC

## 2011-07-03 NOTE — Assessment & Plan Note (Signed)
Increase her benazepril to 20 mg daily. She also continues on Lasix primarily for lower stream any edema. She will send me some blood pressure readings over the next 4-6 weeks and we'll see her back in 3 months.

## 2011-07-03 NOTE — Progress Notes (Signed)
  Subjective:    Patient ID: [name], female    DOB: [birth year], 61 y.o.   MRN: [id number]  HPI Followup hypertension. She brings several readings with her and she had e-mailed me some as well. She ranges from 120s to 148 systolic and is in the mid 80s diastolic. She's had no chest pains, no shortness of breath, no headaches. She has gotten back on her exercise program and has lost 4 pounds.   Review of Systems See history of present illness above her review of systems.    Objective:   Physical Exam   Vital signs reviewed. GENERAL: Well-developed, well-nourished, no acute distress. CARDIOVASCULAR: Regular rate and rhythm no murmur gallop or rub LUNGS: Clear to auscultation bilaterally, no rales or wheeze. ABDOMEN: Soft positive bowel sounds NEURO: No gross focal neurological deficits. MSK: Movement of extremity x 4.      Assessment & Plan:

## 2011-07-03 NOTE — Patient Instructions (Signed)
RTC 6 m We have increased your benazepril---send me some more blood pressure readings

## 2011-07-19 ENCOUNTER — Telehealth: Payer: Self-pay | Admitting: Family Medicine

## 2011-07-19 NOTE — Telephone Encounter (Signed)
Well I do not want that for sure, thanks.   Have a great day,  Aaron  [email or web address]  [phone]    From: Denny Levy Sent: Wed 07/17/2011 12:59 PM To: Johny Shears Subject: RE: BP and stuff Menaal The BP readings are EXCELLENT! I am happy with where we are. Unfortunately I do not agree with treating subclinical hypothyroidism--it can lead to severe osteoporosis and other issues. Huntley Dec   From: Johny Shears  Sent: Wednesday, Jul 17, 2011 6:51 AM To: Denny Levy Subject: BP and stuff   Good Morning, Hope you are having a good week, here are the blood pressures I have taken so far. 5-16 @ 6:30AM 116/81, recheck at 10:10 AM 112/80 5-17@6 :23AM 107/85  Just walked in from parking lot 5-18 @ 7:27 PM 112/78 5/20 @8 :46PM 113/77 5-22@ 12:28 PM 92/70 rechecked at 12:30PM 111/72 5-23, I decided to do some random checks while I was riding my exercise bike for 30 minutes @4 :21PM 113/77 @4 :33 137/73 and right when I finished at 4:50 124/77 5-24 @ 3:50PM 132/80 just finished riding bike   I also have something I hope you will agree for me to try, I was reading an article over the weekend about women's health issues and one of the things I read was about hypothyroidism.  I know I have been tested and I always seem to fall in the normal range however I do have several symptoms, fatigue, very sensitive to cold, I am freezing almost all the time, dry skin, elevated cholesterol, muscle aches and pain, stiffness in my joints and of course my depression.  I realize all these could be other things but what the article said was that in women with normal findings but symptomatic it is recommended that they be placed on a trial of thyroid replacement therapy.     I was hoping that we could do this, I already have an appointment to follow up with you in about 5 months from now and if you are agreeable to put me on a trial dose and see if things improve I would like to try it.  Let me know what you  think.  Thanks.   Have a great day,  Ellinore  [email or web address]  [phone]

## 2011-08-20 ENCOUNTER — Other Ambulatory Visit: Payer: Self-pay | Admitting: Family Medicine

## 2011-09-11 ENCOUNTER — Other Ambulatory Visit: Payer: Self-pay

## 2011-11-08 ENCOUNTER — Other Ambulatory Visit: Payer: Self-pay | Admitting: Family Medicine

## 2011-11-08 DIAGNOSIS — R911 Solitary pulmonary nodule: Secondary | ICD-10-CM

## 2011-11-11 ENCOUNTER — Encounter: Payer: Self-pay | Admitting: Family Medicine

## 2011-11-11 ENCOUNTER — Ambulatory Visit (HOSPITAL_COMMUNITY)
Admission: RE | Admit: 2011-11-11 | Discharge: 2011-11-11 | Disposition: A | Discharge status: Home / Self Care | Payer: 59 | Source: Ambulatory Visit | Attending: Family Medicine | Admitting: Family Medicine

## 2011-11-11 DIAGNOSIS — R911 Solitary pulmonary nodule: Secondary | ICD-10-CM | POA: Insufficient documentation

## 2011-11-22 ENCOUNTER — Other Ambulatory Visit: Payer: Self-pay | Admitting: Family Medicine

## 2011-12-25 ENCOUNTER — Encounter: Payer: Self-pay | Admitting: Family Medicine

## 2011-12-25 ENCOUNTER — Ambulatory Visit (INDEPENDENT_AMBULATORY_CARE_PROVIDER_SITE_OTHER): Payer: 59 | Admitting: Family Medicine

## 2011-12-25 VITALS — BP 104/68 | HR 91 | Temp 97.7°F | Ht 60.0 in | Wt 196.0 lb

## 2011-12-25 DIAGNOSIS — J0100 Acute maxillary sinusitis, unspecified: Secondary | ICD-10-CM

## 2011-12-25 DIAGNOSIS — I10 Essential (primary) hypertension: Secondary | ICD-10-CM

## 2011-12-25 LAB — LIPID PANEL
HDL: 63 mg/dL (ref >39)
LDL Cholesterol: 163 mg/dL — ABNORMAL HIGH (ref 0–99)
Total CHOL/HDL Ratio: 4 Ratio
Triglycerides: 141 mg/dL (ref <150)

## 2011-12-25 LAB — COMPREHENSIVE METABOLIC PANEL
AST: 26 U/L (ref 0–37)
Alkaline Phosphatase: 84 U/L (ref 39–117)
BUN: 11 mg/dL (ref 6–23)
Calcium: 9.1 mg/dL (ref 8.4–10.5)
Creat: 0.52 mg/dL (ref 0.50–1.10)
Glucose, Bld: 101 mg/dL — ABNORMAL HIGH (ref 70–99)

## 2011-12-25 MED ORDER — SULFAMETHOXAZOLE-TRIMETHOPRIM 800-160 MG PO TABS: 1.0000 | ORAL_TABLET | Freq: Two times a day (BID) | ORAL | Status: DC

## 2011-12-25 NOTE — Patient Instructions (Addendum)
I have sent in a prescription for an antibiotic for what appears to be a left maxillary sinusitis. If you continue to have symptoms after the completion of the antibiotic, then I think we should probably do a CT scan of her sinuses. Let me know if you don't get better.  Her blood pressure looks great!. I will recheck your labwork today and send you a note about that.  Great to see you !

## 2011-12-25 NOTE — Progress Notes (Signed)
  Subjective:    Patient ID: [name], female    DOB: [birth year], 61 y.o.   MRN: [id number]  HPI Followup hypertension. We had made some medication adjustments and she's doing quite well with those, having no side effects. Has a occasionally a few heart palpitations but no associated symptoms. Denies chest pain, shortness of breath, lower extremity edema. Taking her medicines regularly.   #2. Left maxillary pain. Has had this in the past and used he is up with a sinus infection. She saw her dentist and he did not think it was related to keep. She has noticed an area on her upper left gum that is occasionally red and sore. This is been going on for years. She has not seen any pus. The maxillary pain is worse if she bends over. She has not had fever. She does have stopped up nose most of the time.  Review of Systems See history of present illness     Objective:   Physical Exam  Vital signs reviewed. GENERAL: Well-developed, well-nourished, no acute distress. CARDIOVASCULAR: Regular rate and rhythm no murmur gallop or rub LUNGS: Clear to auscultation bilaterally, no rales or wheeze. ABDOMEN: Soft positive bowel sounds NEURO: No gross focal neurological deficits. MSK: Movement of extremity x 4. HEENT. Nontender to palpation or percussion of the sinuses. Nasal mucosa is pale, boggy, extremely swollen turbinate on the left.       Assessment & Plan:  #1. Probably acute on chronic maxillary sinusitis. We'll treat her with Bactrim. This gets better were fine. If he does not improve I would get a CT of the sinuses.

## 2011-12-25 NOTE — Assessment & Plan Note (Signed)
Doing well on current medications. No medication changes. We are checking a BMP today

## 2011-12-26 ENCOUNTER — Encounter: Payer: Self-pay | Admitting: Family Medicine

## 2011-12-26 ENCOUNTER — Other Ambulatory Visit: Payer: Self-pay | Admitting: Family Medicine

## 2012-01-21 ENCOUNTER — Other Ambulatory Visit: Payer: Self-pay | Admitting: Family Medicine

## 2012-01-21 NOTE — Telephone Encounter (Signed)
Dear White Team Can u call this in? THANKS! Ellaree Gear  

## 2012-01-22 ENCOUNTER — Telehealth: Payer: Self-pay | Admitting: *Deleted

## 2012-01-22 ENCOUNTER — Other Ambulatory Visit: Payer: Self-pay | Admitting: Family Medicine

## 2012-01-22 DIAGNOSIS — R42 Dizziness and giddiness: Secondary | ICD-10-CM

## 2012-01-22 DIAGNOSIS — J019 Acute sinusitis, unspecified: Secondary | ICD-10-CM

## 2012-01-22 DIAGNOSIS — Z1231 Encounter for screening mammogram for malignant neoplasm of breast: Secondary | ICD-10-CM

## 2012-01-22 NOTE — Telephone Encounter (Signed)
Called and informed patient that order was in. She will schedule her own appointment

## 2012-01-22 NOTE — Progress Notes (Signed)
Dear [name] Team Can u schedule this for her---actually maybe you should call her at her work number (she works in [employer]) and tell her I did put the order in--I think she likes to schedule her won. THANKS! Denny Levy

## 2012-01-24 ENCOUNTER — Ambulatory Visit (HOSPITAL_COMMUNITY)
Admission: RE | Admit: 2012-01-24 | Discharge: 2012-01-24 | Disposition: A | Discharge status: Home / Self Care | Payer: 59 | Source: Ambulatory Visit | Attending: Family Medicine | Admitting: Family Medicine

## 2012-01-24 DIAGNOSIS — J019 Acute sinusitis, unspecified: Secondary | ICD-10-CM | POA: Insufficient documentation

## 2012-01-24 DIAGNOSIS — R42 Dizziness and giddiness: Secondary | ICD-10-CM | POA: Insufficient documentation

## 2012-01-28 ENCOUNTER — Encounter: Payer: Self-pay | Admitting: Family Medicine

## 2012-02-17 ENCOUNTER — Ambulatory Visit (HOSPITAL_COMMUNITY)
Admission: RE | Admit: 2012-02-17 | Discharge: 2012-02-17 | Disposition: A | Discharge status: Home / Self Care | Payer: 59 | Source: Ambulatory Visit | Attending: Family Medicine | Admitting: Family Medicine

## 2012-02-17 DIAGNOSIS — Z1231 Encounter for screening mammogram for malignant neoplasm of breast: Secondary | ICD-10-CM | POA: Insufficient documentation

## 2012-02-20 ENCOUNTER — Other Ambulatory Visit: Payer: Self-pay | Admitting: Family Medicine

## 2012-03-20 ENCOUNTER — Other Ambulatory Visit: Payer: Self-pay | Admitting: Family Medicine

## 2012-04-10 ENCOUNTER — Encounter: Payer: Self-pay | Admitting: Family Medicine

## 2012-04-10 DIAGNOSIS — L4050 Arthropathic psoriasis, unspecified: Secondary | ICD-10-CM | POA: Insufficient documentation

## 2012-04-15 ENCOUNTER — Encounter: Payer: Self-pay | Admitting: Family Medicine

## 2012-04-21 ENCOUNTER — Encounter: Payer: Self-pay | Admitting: Family Medicine

## 2012-05-22 ENCOUNTER — Other Ambulatory Visit: Payer: Self-pay | Admitting: Family Medicine

## 2012-06-24 ENCOUNTER — Other Ambulatory Visit: Payer: Self-pay | Admitting: Family Medicine

## 2012-06-24 ENCOUNTER — Encounter: Payer: Self-pay | Admitting: Family Medicine

## 2012-06-24 MED ORDER — DOXYCYCLINE HYCLATE 100 MG PO CAPS: 100.0000 mg | ORAL_CAPSULE | Freq: Two times a day (BID) | ORAL | Status: DC

## 2012-08-24 ENCOUNTER — Other Ambulatory Visit: Payer: Self-pay | Admitting: Family Medicine

## 2012-10-10 ENCOUNTER — Telehealth: Payer: Self-pay | Admitting: Family Medicine

## 2012-10-10 NOTE — Telephone Encounter (Signed)
Pt called the after hours line.  Pt outside 1 hr ago and tripped and fell in the grass Hit back and back of head on tree stump 4 areas of bruising on leg SHoulder hurts Lower back also w/ bruise. Ice packs in several places.  Denies LOC, PEERL syncope, lightheadedness, CP.  Taking voltaren Counseled w/ pt on signs symptoms of ICH and reasons for emergent evaluation.   Shelly Flatten, MD Family Medicine PGY-3 10/10/2012, 5:55 PM

## 2012-11-20 ENCOUNTER — Ambulatory Visit (INDEPENDENT_AMBULATORY_CARE_PROVIDER_SITE_OTHER): Payer: 59 | Admitting: Podiatrist

## 2012-11-20 ENCOUNTER — Encounter: Payer: Self-pay | Admitting: Podiatrist

## 2012-11-20 ENCOUNTER — Ambulatory Visit (INDEPENDENT_AMBULATORY_CARE_PROVIDER_SITE_OTHER): Payer: 59

## 2012-11-20 ENCOUNTER — Ambulatory Visit: Payer: 59

## 2012-11-20 VITALS — BP 111/92 | HR 70 | Resp 16 | Ht 60.0 in

## 2012-11-20 DIAGNOSIS — M2041 Other hammer toe(s) (acquired), right foot: Secondary | ICD-10-CM

## 2012-11-20 DIAGNOSIS — M2040 Other hammer toe(s) (acquired), unspecified foot: Secondary | ICD-10-CM | POA: Insufficient documentation

## 2012-11-20 NOTE — Progress Notes (Signed)
Subjective: [name] presents today with her husband for followup of right foot surgery and subsequent removal of pin third toe right foot. patient states the foot is feeling better. Objective: Her vascular status is intact the right foot and unchanged. Excellent appearance of the second toe is noted with rectus alignment noted slight dorsiflexion contracture at the MPJ noted. Right third toe continues to improve dried blood and  tissue presentt at the tip of her toe.  No redness, swelling noted.  Significant improvement in appearance of toe noted in comparison with last visit. A:  S/p hammertoe correction with screw fixation and removal of screw 3rd toe due to cellulitis P:  Toe wrapped with betadine gauze and patient given instructions on bathing and aftercare.  Return in 1 week for suture removal

## 2012-11-20 NOTE — Patient Instructions (Signed)
You may remove your dressing on your right foot and get the foot wet in the shower.  After your shower, pat the toe dry, and cover with a dry bandage.  I will remove the sutures next week

## 2012-11-27 ENCOUNTER — Encounter: Payer: Self-pay | Admitting: Podiatrist

## 2012-11-27 ENCOUNTER — Ambulatory Visit (INDEPENDENT_AMBULATORY_CARE_PROVIDER_SITE_OTHER): Payer: 59 | Admitting: Podiatrist

## 2012-11-27 VITALS — BP 130/83 | HR 76 | Resp 18

## 2012-11-27 DIAGNOSIS — Z9889 Other specified postprocedural states: Secondary | ICD-10-CM

## 2012-11-27 NOTE — Progress Notes (Signed)
°  Subjective:    Patient ID: [name], female    DOB: 1950-12-19, 62 y.o.   MRN: [id number]  HPI patient presents having surgery on 10-21-12 on right 2nd toe and states that it is not too bad today, still sore. lc    Review of Systems  Constitutional: Negative.   HENT: Negative.   Eyes: Negative.   Respiratory: Negative.   Cardiovascular: Negative.   Gastrointestinal: Negative.   Endocrine: Negative.   Genitourinary: Negative.   Musculoskeletal: Negative.   Skin: Negative.   Allergic/Immunologic: Negative.   Hematological: Negative.   Psychiatric/Behavioral: Negative.        Objective:   Physical Exam Subjective: [name] presents today with her husband for followup of right foot surgery and subsequent removal of pin third toe right foot. patient states the foot is feeling better. She is having less swelling and less redness out of the 3rd toe.  2nd toe continues to elevate mildly.  No pain with standing or in darco shoe noted. Objective: Her vascular status is intact the right foot and unchanged. Excellent appearance of the second toe is noted with rectus alignment noted slight dorsiflexion contracture at the MPJ noted. Right third toe continues to improve with necrotic tissue removed from the tip of the toe and healthy pink tissue seen.  No drainage No redness, swelling noted. Significant improvement in appearance of toe noted in comparison with last visit.               Assessment & Plan:  A: S/p hammertoe correction with screw fixation and removal of screw 3rd toe due to cellulitis  P: Sutures removed from tip of toe and necrotic tissue easily lifted free from toe.  Wrapping 1st and 2nd toes with paper tape to plantarflex toe recommended.  Patient will be seen back in 2 weeks for follow up and for decision regarding her return to work status.  Marlowe Aschoff, DPM

## 2012-11-27 NOTE — Patient Instructions (Signed)
You can get your foot wet!  Continue wearing your surgical shoe until I see you in 2 weeks.  Bring your paperwork with you for return to work info.  Thanks!!

## 2012-12-01 ENCOUNTER — Other Ambulatory Visit: Payer: Self-pay | Admitting: Family Medicine

## 2012-12-11 ENCOUNTER — Encounter: Payer: Self-pay | Admitting: Podiatrist

## 2012-12-11 ENCOUNTER — Ambulatory Visit (INDEPENDENT_AMBULATORY_CARE_PROVIDER_SITE_OTHER): Payer: 59 | Admitting: Podiatrist

## 2012-12-11 ENCOUNTER — Ambulatory Visit: Payer: Self-pay

## 2012-12-11 VITALS — BP 123/74 | HR 79 | Resp 16 | Ht 60.0 in | Wt 210.0 lb

## 2012-12-11 DIAGNOSIS — Z9889 Other specified postprocedural states: Secondary | ICD-10-CM

## 2012-12-11 DIAGNOSIS — M2040 Other hammer toe(s) (acquired), unspecified foot: Secondary | ICD-10-CM

## 2012-12-11 DIAGNOSIS — M2041 Other hammer toe(s) (acquired), right foot: Secondary | ICD-10-CM

## 2012-12-11 NOTE — Progress Notes (Signed)
  Subjective: [name] presents today with her husband for followup of right foot surgery on Hammertoes 2 and 3 with screw fixation and subsequent removal of pin third toe right foot. patient states the foot is feeling better. She is having less swelling and less redness out of the 3rd toe. 2nd toe continues to elevate mildly. No pain with standing or in darco shoe noted.  Objective: Her vascular status is intact the right foot and unchanged. Excellent appearance of the second toe is noted with rectus alignment noted slight dorsiflexion contracture at the MPJ noted. Right third toe continues to improve with healthy pink tissue seen. No drainage No redness,or swelling noted. Excellent appearance of digits are seen.  Assessment: Status post right foot surgery on digits 2 and 3 Plan: Excellent appearance and healing of the digits seen at today's visit. Recommended continued use of Darco splint and she's able to wear whatever shoes are comfortable .  A note to return to work was given with a max time on her foot up to 2 hours.  She will be seen again in 1 month for follow up. Will re-evaluate her work status at that time.  Marlowe Aschoff DPM

## 2012-12-14 NOTE — Addendum Note (Signed)
Encounter addended by: Sanda Linger, CCT on: 12/14/2012 12:15 PM<BR>     Documentation filed: Charges VN

## 2012-12-24 ENCOUNTER — Other Ambulatory Visit: Payer: Self-pay

## 2013-01-08 ENCOUNTER — Encounter: Payer: 59 | Admitting: Podiatrist

## 2013-01-08 ENCOUNTER — Ambulatory Visit (INDEPENDENT_AMBULATORY_CARE_PROVIDER_SITE_OTHER): Payer: 59

## 2013-01-08 ENCOUNTER — Ambulatory Visit (INDEPENDENT_AMBULATORY_CARE_PROVIDER_SITE_OTHER): Payer: 59 | Admitting: Podiatrist

## 2013-01-08 ENCOUNTER — Encounter: Payer: Self-pay | Admitting: Podiatrist

## 2013-01-08 VITALS — BP 118/72 | HR 77 | Resp 12

## 2013-01-08 DIAGNOSIS — R52 Pain, unspecified: Secondary | ICD-10-CM

## 2013-01-13 NOTE — Progress Notes (Signed)
  Subjective: Kamaile presents today with her husband for followup of right foot surgery on Hammertoes 2 and 3 with screw fixation and subsequent removal of pin third toe right foot. patient states the foot is feeling better and better. 2nd toe continues to elevate mildly. No pain with standing or in tennis shoe noted.   Objective: Her vascular status is intact the right foot and unchanged. Excellent appearance of the second toe is noted with rectus alignment noted slight dorsiflexion contracture at the MPJ noted. Right third toe continues to improve with healthy pink tissue seen. No drainage No redness,or swelling noted. Excellent appearance of digits are seen.   Assessment: Status post right foot surgery on digits 2 and 3   Plan: Excellent appearance and healing of the digits seen at today's visit. Recommended continued use of Darco splint at night only and she's able to wear whatever shoes are comfortable . A note to return to work was written such that she may return to work at her normal schedule. She'll be seen back on a when necessary basis and if any problems or concerns arise she'll call.

## 2013-02-19 ENCOUNTER — Other Ambulatory Visit: Payer: Self-pay | Admitting: Family Medicine

## 2013-04-15 ENCOUNTER — Other Ambulatory Visit: Payer: Self-pay | Admitting: Family Medicine

## 2013-04-19 NOTE — Telephone Encounter (Signed)
Dear [name] Team please call in as below [facility]! Dorcas Mcmurray

## 2013-04-19 NOTE — Telephone Encounter (Signed)
rx call in and patient was informed.[name], [name]

## 2013-05-24 ENCOUNTER — Other Ambulatory Visit: Payer: Self-pay | Admitting: Family Medicine

## 2013-08-03 ENCOUNTER — Encounter: Payer: Self-pay | Admitting: *Deleted

## 2013-08-09 ENCOUNTER — Other Ambulatory Visit: Payer: Self-pay | Admitting: Family Medicine

## 2013-08-09 DIAGNOSIS — Z1231 Encounter for screening mammogram for malignant neoplasm of breast: Secondary | ICD-10-CM

## 2013-08-13 ENCOUNTER — Ambulatory Visit (HOSPITAL_COMMUNITY)
Admission: RE | Admit: 2013-08-13 | Discharge: 2013-08-13 | Disposition: A | Discharge status: Home / Self Care | Payer: 59 | Source: Ambulatory Visit | Attending: Family Medicine | Admitting: Family Medicine

## 2013-08-13 DIAGNOSIS — Z1231 Encounter for screening mammogram for malignant neoplasm of breast: Secondary | ICD-10-CM | POA: Insufficient documentation

## 2013-09-01 ENCOUNTER — Ambulatory Visit (INDEPENDENT_AMBULATORY_CARE_PROVIDER_SITE_OTHER): Payer: 59 | Admitting: Family Medicine

## 2013-09-01 ENCOUNTER — Telehealth: Payer: Self-pay | Admitting: Family Medicine

## 2013-09-01 ENCOUNTER — Encounter: Payer: Self-pay | Admitting: Family Medicine

## 2013-09-01 VITALS — BP 120/61 | HR 79 | Temp 98.2°F | Ht 60.0 in | Wt 217.5 lb

## 2013-09-01 DIAGNOSIS — R51 Headache: Secondary | ICD-10-CM

## 2013-09-01 DIAGNOSIS — I10 Essential (primary) hypertension: Secondary | ICD-10-CM

## 2013-09-01 DIAGNOSIS — M67919 Unspecified disorder of synovium and tendon, unspecified shoulder: Secondary | ICD-10-CM

## 2013-09-01 DIAGNOSIS — Z01810 Encounter for preprocedural cardiovascular examination: Secondary | ICD-10-CM | POA: Insufficient documentation

## 2013-09-01 DIAGNOSIS — R911 Solitary pulmonary nodule: Secondary | ICD-10-CM

## 2013-09-01 DIAGNOSIS — Z0000 Encounter for general adult medical examination without abnormal findings: Secondary | ICD-10-CM

## 2013-09-01 DIAGNOSIS — R5383 Other fatigue: Principal | ICD-10-CM

## 2013-09-01 DIAGNOSIS — R5381 Other malaise: Secondary | ICD-10-CM

## 2013-09-01 DIAGNOSIS — M719 Bursopathy, unspecified: Secondary | ICD-10-CM

## 2013-09-01 LAB — LDL CHOLESTEROL, DIRECT: Direct LDL: 138 mg/dL — ABNORMAL HIGH

## 2013-09-01 LAB — CBC WITH DIFFERENTIAL/PLATELET
Basophils Absolute: 0.1 10*3/uL (ref 0.0–0.1)
Basophils Relative: 1 % (ref 0–1)
EOS PCT: 2 % (ref 0–5)
Eosinophils Absolute: 0.1 10*3/uL (ref 0.0–0.7)
HEMATOCRIT: 35.9 % — AB (ref 36.0–46.0)
Hemoglobin: 12.4 g/dL (ref 12.0–15.0)
LYMPHS PCT: 32 % (ref 12–46)
Lymphs Abs: 1.9 10*3/uL (ref 0.7–4.0)
MCH: 30.1 pg (ref 26.0–34.0)
MCHC: 34.5 g/dL (ref 30.0–36.0)
MCV: 87.1 fL (ref 78.0–100.0)
MONO ABS: 0.8 10*3/uL (ref 0.1–1.0)
Monocytes Relative: 14 % — ABNORMAL HIGH (ref 3–12)
Neutro Abs: 3 10*3/uL (ref 1.7–7.7)
Neutrophils Relative %: 51 % (ref 43–77)
PLATELETS: 280 10*3/uL (ref 150–400)
RBC: 4.12 MIL/uL (ref 3.87–5.11)
RDW: 15.5 % (ref 11.5–15.5)
WBC: 5.8 10*3/uL (ref 4.0–10.5)

## 2013-09-01 LAB — COMPREHENSIVE METABOLIC PANEL
ALT: 34 U/L (ref 0–35)
AST: 34 U/L (ref 0–37)
Albumin: 3.9 g/dL (ref 3.5–5.2)
Alkaline Phosphatase: 107 U/L (ref 39–117)
BILIRUBIN TOTAL: 0.3 mg/dL (ref 0.2–1.2)
BUN: 12 mg/dL (ref 6–23)
CO2: 27 meq/L (ref 19–32)
CREATININE: 0.52 mg/dL (ref 0.50–1.10)
Calcium: 9 mg/dL (ref 8.4–10.5)
Chloride: 102 mEq/L (ref 96–112)
GLUCOSE: 92 mg/dL (ref 70–99)
Potassium: 4.6 mEq/L (ref 3.5–5.3)
Sodium: 136 mEq/L (ref 135–145)
Total Protein: 6 g/dL (ref 6.0–8.3)

## 2013-09-01 LAB — TSH: TSH: 2.741 u[IU]/mL (ref 0.350–4.500)

## 2013-09-01 MED ORDER — METHYLPREDNISOLONE ACETATE 40 MG/ML INJ SUSP (RADIOLOG
120.0000 mg | Freq: Once | INTRAMUSCULAR | Status: AC
  Administered 2013-09-01: 120 mg via INTRA_ARTICULAR

## 2013-09-01 MED ORDER — DICLOFENAC SODIUM 75 MG PO TBEC: 75.0000 mg | DELAYED_RELEASE_TABLET | Freq: Two times a day (BID) | ORAL | Status: DC | PRN

## 2013-09-01 NOTE — Telephone Encounter (Signed)
Simmie need new rx for bp medication and Mariacristina need Flonase refill sent in for her

## 2013-09-02 ENCOUNTER — Encounter: Payer: Self-pay | Admitting: Family Medicine

## 2013-09-03 ENCOUNTER — Encounter: Payer: Self-pay | Admitting: Family Medicine

## 2013-09-03 ENCOUNTER — Telehealth: Payer: Self-pay | Admitting: Family Medicine

## 2013-09-03 ENCOUNTER — Ambulatory Visit (HOSPITAL_COMMUNITY)
Admission: RE | Admit: 2013-09-03 | Discharge: 2013-09-03 | Disposition: A | Discharge status: Home / Self Care | Payer: 59 | Source: Ambulatory Visit | Attending: Family Medicine | Admitting: Family Medicine

## 2013-09-03 DIAGNOSIS — R911 Solitary pulmonary nodule: Secondary | ICD-10-CM

## 2013-09-03 DIAGNOSIS — R918 Other nonspecific abnormal finding of lung field: Secondary | ICD-10-CM | POA: Insufficient documentation

## 2013-09-03 MED ORDER — FLUTICASONE PROPIONATE 50 MCG/ACT NA SUSP: 1.0000 | Freq: Every day | NASAL | Status: DC

## 2013-09-03 NOTE — Telephone Encounter (Signed)
Dear [name] Team Please call and tell her the CT scan show nodule [facility] says no more follow up needed which is GREAT. I am sending her a copy of report in mail THANKS! Dorcas Mcmurray

## 2013-09-06 NOTE — Telephone Encounter (Signed)
LVM for patient to call back. ?

## 2013-09-07 NOTE — Assessment & Plan Note (Signed)
She is status post TAH/BSO so we do not need to do Pap smear or pelvic. She has had her mammogram. She has had her colonoscopy. We're updating her pneumonia shot today. Due to her report of some fatigue we'll check some labs including thyroid and hemoglobin. We'll continue her other chronic medications

## 2013-09-07 NOTE — Progress Notes (Signed)
   Subjective:    Patient ID: [name], female    DOB: [birth year], 63 y.o.   MRN: [id number]  HPI Here for her wellness visit. She is enjoying retirement. She's had a little more fatigue in the last month and is not sure of this is related to the remodeling in her house she has been doing what he. She's concerned that she might have a thyroid problem or low blood count and would like to have this checked. #2. Having occasional headache with orgasm probably twice in the last month. Not had these before. They resolve with the half hour. Also having more headaches than usual probably 4 or 5 a week. They're not present when she gets up in the morning. They usually come on late in the morning or mid day and then resolve by afternoon. She's not sure if they're related to sinus congestion and she has been doing a lot of yard work. She has no other associated symptoms with them just some generalized frontal head pain. No R.   Review of Systems  Constitutional: Positive for fatigue. Negative for activity change, appetite change and unexpected weight change.  HENT: Positive for congestion, postnasal drip and sinus pressure. Negative for trouble swallowing.   Eyes: Negative for pain.  Respiratory: Negative for cough, chest tightness and wheezing.   Cardiovascular: Negative for chest pain, palpitations and leg swelling.  Gastrointestinal: Negative for nausea, diarrhea, constipation and blood in stool.  Endocrine: Negative for cold intolerance and heat intolerance.  Genitourinary: Negative for dysuria and frequency.  Neurological: Positive for headaches. Negative for dizziness, tremors, facial asymmetry, speech difficulty and light-headedness.  Psychiatric/Behavioral: Negative for hallucinations, confusion, dysphoric mood, decreased concentration and agitation. The patient is not nervous/anxious.        Objective:   Physical Exam  Constitutional: She is oriented to person, place, and time. She  appears well-developed and well-nourished.  HENT:  TMs bilaterally are retracted and there some chronic scarring noted. The external auditory canal is normal without a lot of cerumen impaction.  Eyes: Conjunctivae and EOM are normal. Pupils are equal, round, and reactive to light. No scleral icterus.  Neck: No tracheal deviation present. No thyromegaly present.  Cardiovascular: Normal rate, regular rhythm and intact distal pulses.   Murmur heard. Pulmonary/Chest: Effort normal and breath sounds normal.  Abdominal: Soft. Bowel sounds are normal.  Genitourinary:  Deferred secondary to hysterectomy with BSO  Musculoskeletal: Normal range of motion. She exhibits no edema.  Lymphadenopathy:    She has no cervical adenopathy.  Neurological: She is alert and oriented to person, place, and time. She displays abnormal reflex. She exhibits normal muscle tone. Coordination normal.  Skin: No rash noted.  Psychiatric: She has a normal mood and affect. Her behavior is normal. Judgment and thought content normal.          Assessment & Plan:

## 2013-09-07 NOTE — Telephone Encounter (Signed)
Spoke with patient and informed her of below 

## 2013-09-09 ENCOUNTER — Encounter: Payer: Self-pay | Admitting: Family Medicine

## 2013-09-09 NOTE — Telephone Encounter (Signed)
I have updated it in system.[name]

## 2013-10-12 ENCOUNTER — Other Ambulatory Visit (HOSPITAL_COMMUNITY): Payer: Self-pay | Admitting: Orthopedic Surgery

## 2013-10-12 DIAGNOSIS — M25561 Pain in right knee: Secondary | ICD-10-CM

## 2013-10-13 ENCOUNTER — Other Ambulatory Visit: Payer: Self-pay | Admitting: Hematology & Oncology

## 2013-10-16 ENCOUNTER — Ambulatory Visit (HOSPITAL_BASED_OUTPATIENT_CLINIC_OR_DEPARTMENT_OTHER)
Admission: RE | Admit: 2013-10-16 | Discharge: 2013-10-16 | Disposition: A | Discharge status: Home / Self Care | Payer: 59 | Source: Ambulatory Visit | Attending: Orthopedic Surgery | Admitting: Orthopedic Surgery

## 2013-10-16 DIAGNOSIS — M25669 Stiffness of unspecified knee, not elsewhere classified: Secondary | ICD-10-CM | POA: Diagnosis not present

## 2013-10-16 DIAGNOSIS — M7120 Synovial cyst of popliteal space [Baker], unspecified knee: Secondary | ICD-10-CM | POA: Insufficient documentation

## 2013-10-16 DIAGNOSIS — M25561 Pain in right knee: Secondary | ICD-10-CM

## 2013-10-16 DIAGNOSIS — M23349 Other meniscus derangements, anterior horn of lateral meniscus, unspecified knee: Secondary | ICD-10-CM | POA: Insufficient documentation

## 2013-10-16 DIAGNOSIS — M25469 Effusion, unspecified knee: Secondary | ICD-10-CM | POA: Diagnosis not present

## 2013-10-16 DIAGNOSIS — M25569 Pain in unspecified knee: Secondary | ICD-10-CM | POA: Diagnosis present

## 2013-10-19 ENCOUNTER — Ambulatory Visit (HOSPITAL_COMMUNITY): Payer: 59

## 2013-10-20 ENCOUNTER — Other Ambulatory Visit: Payer: Self-pay | Admitting: Family Medicine

## 2013-10-21 ENCOUNTER — Other Ambulatory Visit: Payer: Self-pay | Admitting: Family Medicine

## 2013-10-21 NOTE — Telephone Encounter (Signed)
Dear [name] Team Please call in her alprazolam as below [facility]! Dorcas Mcmurray

## 2013-10-21 NOTE — Telephone Encounter (Signed)
Rx called in and patient informed.[name], [name]

## 2013-11-22 ENCOUNTER — Other Ambulatory Visit: Payer: Self-pay | Admitting: Family Medicine

## 2013-12-03 ENCOUNTER — Other Ambulatory Visit: Payer: Self-pay

## 2014-02-18 DIAGNOSIS — I499 Cardiac arrhythmia, unspecified: Secondary | ICD-10-CM

## 2014-02-18 HISTORY — DX: Cardiac arrhythmia, unspecified: I49.9

## 2014-04-25 ENCOUNTER — Other Ambulatory Visit: Payer: Self-pay | Admitting: Family Medicine

## 2014-04-26 NOTE — Telephone Encounter (Signed)
Dear [name] Team Please call in the refills on alprazolam THANKS! Dorcas Mcmurray

## 2014-05-17 ENCOUNTER — Other Ambulatory Visit: Payer: Self-pay | Admitting: Family Medicine

## 2014-05-17 NOTE — Telephone Encounter (Signed)
Dear [name] Team please call this in w 5 refils as above. THANKS! Dorcas Mcmurray

## 2014-05-19 NOTE — Telephone Encounter (Signed)
Rx called in verbally to [facility] outpatient pharmacy.  Pt informed. Vasil Juhasz, Salome Spotted

## 2014-05-31 ENCOUNTER — Other Ambulatory Visit: Payer: Self-pay | Admitting: Family Medicine

## 2014-09-01 ENCOUNTER — Other Ambulatory Visit: Payer: Self-pay | Admitting: Family Medicine

## 2014-09-08 ENCOUNTER — Other Ambulatory Visit: Payer: Self-pay | Admitting: Family Medicine

## 2014-11-14 ENCOUNTER — Other Ambulatory Visit: Payer: Self-pay | Admitting: Family Medicine

## 2014-11-25 ENCOUNTER — Encounter: Payer: Self-pay | Admitting: Family Medicine

## 2014-11-25 ENCOUNTER — Ambulatory Visit (INDEPENDENT_AMBULATORY_CARE_PROVIDER_SITE_OTHER): Payer: BLUE CROSS/BLUE SHIELD | Admitting: Family Medicine

## 2014-11-25 VITALS — BP 104/58 | HR 85 | Temp 98.3°F | Wt 218.0 lb

## 2014-11-25 DIAGNOSIS — H66002 Acute suppurative otitis media without spontaneous rupture of ear drum, left ear: Secondary | ICD-10-CM

## 2014-11-25 DIAGNOSIS — H66009 Acute suppurative otitis media without spontaneous rupture of ear drum, unspecified ear: Secondary | ICD-10-CM | POA: Insufficient documentation

## 2014-11-25 MED ORDER — AMOXICILLIN 500 MG PO CAPS: 500.0000 mg | ORAL_CAPSULE | Freq: Two times a day (BID) | ORAL | Status: DC

## 2014-11-25 NOTE — Patient Instructions (Signed)

## 2014-11-30 ENCOUNTER — Encounter: Payer: Self-pay | Admitting: Family Medicine

## 2014-11-30 ENCOUNTER — Ambulatory Visit (INDEPENDENT_AMBULATORY_CARE_PROVIDER_SITE_OTHER): Payer: BLUE CROSS/BLUE SHIELD | Admitting: Family Medicine

## 2014-11-30 ENCOUNTER — Other Ambulatory Visit: Payer: Self-pay | Admitting: Family Medicine

## 2014-11-30 VITALS — BP 122/67 | HR 72 | Temp 98.2°F | Ht 60.0 in | Wt 218.6 lb

## 2014-11-30 DIAGNOSIS — J3489 Other specified disorders of nose and nasal sinuses: Secondary | ICD-10-CM | POA: Diagnosis not present

## 2014-11-30 DIAGNOSIS — H6532 Chronic mucoid otitis media, left ear: Secondary | ICD-10-CM

## 2014-11-30 DIAGNOSIS — H6692 Otitis media, unspecified, left ear: Secondary | ICD-10-CM

## 2014-11-30 MED ORDER — VENLAFAXINE HCL ER 150 MG PO CP24
150.0000 mg | ORAL_CAPSULE | Freq: Every day | ORAL | Status: DC
Start: 2014-11-30 — End: 2015-11-27

## 2014-11-30 MED ORDER — ALPRAZOLAM 0.5 MG PO TABS: 0.5000 mg | ORAL_TABLET | Freq: Three times a day (TID) | ORAL | Status: DC | PRN

## 2014-11-30 MED ORDER — BENAZEPRIL HCL 20 MG PO TABS: 20.0000 mg | ORAL_TABLET | Freq: Every day | ORAL | Status: DC

## 2014-11-30 MED ORDER — VENLAFAXINE HCL ER 75 MG PO CP24: ORAL_CAPSULE | ORAL | Status: DC

## 2014-11-30 MED ORDER — MICONAZOLE POWD: Status: DC

## 2014-11-30 NOTE — Progress Notes (Signed)
Subjective:    Patient ID: [name], female    DOB: 04-Jul-1950, 64 y.o.   MRN: [id number]  HPI  Was seen last week for some left ear pain they have been going on about a week. Was started on antibiotics. The pain has continued. She's also had continued fevers. She has been compliant with her medication. Over the last month she's also noticed some left frontal forehead pain that is been pretty constant. It has worsened in the last few days as well. #2. Her rheumatologist is placing her on prednisone for apparent vasculitis exacerbation. She continues on methotrexate as well.  Review of Systems See history of present illness.    Objective:   Physical Exam  Vital signs are reviewed GEN.: Well-developed female no acute distress HEENT: Bilaterally her TMs are quite retracted the left greater than the right. I cannot insufflate the left one at all. Nasal mucosa is boggy and erythematous. Oropharynx is without any exudate, no erythema. Neck is without lymphadenopathy or thyromegaly. Mild tenderness to palpation of the left frontal sinus. SKIN: Multiple red macules bilateral lower extremities from the knee down. Nontender.      Assessment & Plan:  #1. Otitis media on the left does not seem to be improving with antibiotic therapy. Little concerned that she may have chronic mucoid otitis there. Given her left frontal sinus pain,, little concerned she may have loculated sinusitis contributing. With her immunosuppressed status, I think she probably needs to be evaluated for tympanostomy tube in the left ear. Also consider doing a CT scan of her sinuses but if we can get her in with ENT send then I'll let them decide about that. Since we can get her in with them in the next day or so I'm not going change her current antibiotic although I'm not sure it's the best choice. Currently she's on amoxicillin 500 3 times a day.  Current Outpatient Prescriptions on File Prior to Visit  Medication Sig  Dispense Refill  . ALPRAZolam (XANAX) 0.5 MG tablet TAKE 1 TABLET BY MOUTH THREE TIMES DAILY AS NEEDED 90 tablet 5  . amoxicillin (AMOXIL) 500 MG capsule Take 1 capsule (500 mg total) by mouth 2 (two) times daily. 14 capsule 0  . diclofenac (VOLTAREN) 75 MG EC tablet TAKE 1 TABLET BY MOUTH 2 TIMES DAILY AS NEEDED. AS NEEDED FOR JOINT PAIN 180 tablet 3  . fluconazole (DIFLUCAN) 150 MG tablet Take one by mouth once a week for 6 weeks. Stop the 100 mg fluconazole prn during this time. 6 tablet 0  . fluticasone (FLONASE) 50 MCG/ACT nasal spray Place 1-2 sprays into both nostrils daily. 16 g 12  . furosemide (LASIX) 20 MG tablet TAKE 1 OR 2 TABLETS BY MOUTH DAILY FOR LOWER EXTREMITY SWELLING 180 tablet PRN  . NEXIUM 20 MG capsule TAKE 1 CAPSULE BY MOUTH DAILY 90 capsule PRN  . pantoprazole (PROTONIX) 40 MG tablet TAKE 1 TABLET BY MOUTH DAILY 90 tablet PRN  . Terbinafine 1 % GEL Apply to feet twice a day 12 g 5  . tolterodine (DETROL) 2 MG tablet TAKE 1 TABLET BY MOUTH TWO TIMES DAILY 180 tablet 3  . valACYclovir (VALTREX) 500 MG tablet TAKE 1 TABLET BY MOUTH TWO TIMES DAILY FOR 3 DAYS AT [facility] OUTBREAK 6 tablet PRN   No current facility-administered medications on file prior to visit.    Patient Active Problem List   Diagnosis Date Noted  . Psoriatic arthritis ([place]) 04/10/2012    Priority:  High  . Dysthymia 12/12/2010    Priority: High  . Essential hypertension, benign 04/27/2008    Priority: High  . PANIC ATTACKS 04/17/2006    Priority: High  . Palpitations 10/30/2007    Priority: Medium  . KNEE REPLACEMENT, LEFT, HX OF 07/03/2006    Priority: Medium  . GASTROESOPHAGEAL REFLUX, NO ESOPHAGITIS 04/17/2006    Priority: Medium  . ARTHRALGIA, UNSPECIFIED 04/17/2006    Priority: Medium  . ROTATOR CUFF SYNDROME, RIGHT 10/03/2008    Priority: Low  . METATARSALGIA 10/02/2006    Priority: Low  . OBESITY, NOS 04/17/2006    Priority: Low  . CHOLELITHIASIS, NOS 04/17/2006    Priority: Low  .  VAGINITIS, ATROPHIC 04/17/2006    Priority: Low  . Otitis media, acute suppurative 11/25/2014  . Well adult health check 09/01/2013  . Hammertoe 11/20/2012  . Pulmonary nodule seen on imaging study 11/08/2011  . Conjunctivitis 12/16/2010  . URI (upper respiratory infection) 12/16/2010

## 2014-11-30 NOTE — Progress Notes (Unsigned)
Dear [name] Team PLEASE CALL IN HER ALPRAZOLAM THANKS! Dorcas Mcmurray

## 2014-12-01 ENCOUNTER — Telehealth: Payer: Self-pay | Admitting: *Deleted

## 2014-12-01 NOTE — Telephone Encounter (Signed)
Rx called in per progress note from Dr. Nori Riis. Katharina Caper, Sael Furches D, CMA

## 2014-12-02 NOTE — Assessment & Plan Note (Addendum)
Severe ear pain and fullness since last night, no fevers. Recent allergic rhinitis uncontrolled off flonase. Erythema and purulent effusion on exam - amox for AOM given severe pain and immunocompromised patient (on methotrexate) - restart flonase and nonsedating antihistamine if needed to control allergies, explained this predisposed her to infection

## 2014-12-02 NOTE — Progress Notes (Signed)
   Subjective:   [name] is a 64 y.o. female with a history of psoriatic arthritis and HTN here for ear pain  EAR PAIN  Location: left ear Ear pain started: last night Pain is: sharp, associated with fullness/pressure Severity: 10/10 Medications tried: none Recent ear trauma: no Prior ear surgeries: no Antibiotics in the last 30 days: no History of diabetes: no  Symptoms Ear discharge: no Fever: no Pain with chewing: no Ringing in ears: no Dizziness: no Hearing loss: decreased in that ear only Rashes or blisters around ear: no Weight loss: no   Review of Systems:  Per HPI. All other systems reviewed and are negative.   PMH, PSH, Medications, Allergies, and FmHx reviewed and updated in EMR.  Social History: never smoker  Objective:  BP 104/58 mmHg  Pulse 85  Temp(Src) 98.3 F (36.8 C) (Oral)  Wt 218 lb (98.884 kg)  Gen:  65 y.o. female in NAD HEENT: NCAT, MMM, EOMI, PERRL, anicteric sclerae Ears: erythematous TM on left with purulent effusion and bulging on anterior inferior edge CV: RRR, no MRG, no JVD Resp: Non-labored, CTAB, no wheezes noted Abd: Soft, NTND, BS present, no guarding or organomegaly Ext: WWP, no edema MSK: Full ROM, strength intact Neuro: Alert and oriented, speech normal      Chemistry      Component Value Date/Time   NA 136 09/01/2013 0959   K 4.6 09/01/2013 0959   CL 102 09/01/2013 0959   CO2 27 09/01/2013 0959   BUN 12 09/01/2013 0959   CREATININE 0.52 09/01/2013 0959   CREATININE <0.47* 08/08/2010 0600      Component Value Date/Time   CALCIUM 9.0 09/01/2013 0959   ALKPHOS 107 09/01/2013 0959   AST 34 09/01/2013 0959   ALT 34 09/01/2013 0959   BILITOT 0.3 09/01/2013 0959      Lab Results  Component Value Date   WBC 5.8 09/01/2013   HGB 12.4 09/01/2013   HCT 35.9* 09/01/2013   MCV 87.1 09/01/2013   PLT 280 09/01/2013   Lab Results  Component Value Date   TSH 2.741 09/01/2013   Lab Results  Component Value  Date   HGBA1C 5.6 08/08/2010   Assessment & Plan:     [name] is a 64 y.o. female here for ear pain  Otitis media, acute suppurative Severe ear pain and fullness since last night, no fevers. Recent allergic rhinitis uncontrolled off flonase. Erythema and purulent effusion on exam - amox for AOM given severe pain and immunocompromised patient (on methotrexate) - restart flonase and nonsedating antihistamine if needed to control allergies, explained this predisposed her to infection     Beverlyn Roux, MD, MPH [place] PGY-3 12/02/2014 3:23 PM

## 2014-12-05 ENCOUNTER — Other Ambulatory Visit: Payer: Self-pay

## 2014-12-05 DIAGNOSIS — Z1231 Encounter for screening mammogram for malignant neoplasm of breast: Secondary | ICD-10-CM

## 2014-12-14 ENCOUNTER — Ambulatory Visit
Admission: RE | Admit: 2014-12-14 | Discharge: 2014-12-14 | Disposition: A | Discharge status: Home / Self Care | Payer: BLUE CROSS/BLUE SHIELD | Source: Ambulatory Visit

## 2014-12-14 DIAGNOSIS — Z1231 Encounter for screening mammogram for malignant neoplasm of breast: Secondary | ICD-10-CM

## 2015-01-16 ENCOUNTER — Encounter: Payer: Self-pay | Admitting: Family Medicine

## 2015-02-22 ENCOUNTER — Ambulatory Visit (INDEPENDENT_AMBULATORY_CARE_PROVIDER_SITE_OTHER): Payer: BLUE CROSS/BLUE SHIELD | Admitting: Family Medicine

## 2015-02-22 ENCOUNTER — Encounter: Payer: Self-pay | Admitting: Family Medicine

## 2015-02-22 VITALS — BP 114/73 | HR 70 | Temp 98.2°F | Ht 60.0 in | Wt 222.2 lb

## 2015-02-22 DIAGNOSIS — L609 Nail disorder, unspecified: Secondary | ICD-10-CM | POA: Diagnosis not present

## 2015-02-22 DIAGNOSIS — Z23 Encounter for immunization: Secondary | ICD-10-CM | POA: Diagnosis not present

## 2015-02-22 DIAGNOSIS — B372 Candidiasis of skin and nail: Secondary | ICD-10-CM

## 2015-02-22 DIAGNOSIS — L608 Other nail disorders: Secondary | ICD-10-CM

## 2015-02-22 MED ORDER — FLUCONAZOLE 100 MG PO TABS: ORAL_TABLET | ORAL | Status: DC

## 2015-02-22 MED ORDER — CLOTRIMAZOLE 1 % EX OINT: TOPICAL_OINTMENT | CUTANEOUS | Status: DC

## 2015-02-22 MED FILL — FLUCONAZOLE 100 MG TABLET: 100 | 84 days supply | Qty: 21 | Fill #0

## 2015-02-22 NOTE — Progress Notes (Signed)
   Subjective:    Patient ID: [name], female    DOB: [birth year], 64 y.o.   MRN: [id number]  HPI #1. Rash in the groin area and underneath the bra  area. Has had this before. Tried using the powder on it and just seemed to get worse. No fever. Several of the areas have started to break open and they're quite painful but no itching. #2. Has questions about the flu shot. She restarted on Remicade per her rheumatologist. #3. Previously had toenail removal on the right great toe. There is a very small piece of toenail that keeps growing back. It is becoming bothersome. She typically pulls it out and did that a few days ago. Positive there is a way to permanently remove it. PERTINENT  PMH / PSH: I have reviewed the patient's medications, allergies, past medical and surgical history. Pertinent findings that relate to today's visit / issues include: Non smoker  Review of Systems No fever, sweats, chills, unusual weight change. No paresthesias in the feet.    Objective:   Physical Exam Vital signs reviewed. GENERAL: Well-developed, well-nourished, no acute distress. SKIN: Under the breasts bilaterally and in the groin and in the area under her abdominal pannus is erythema with a few open areas one or 2 satellite lesions. TOE: Right. Great toe has had a nail removal. There is a very small sliver of nail remaining. There is no sign of infection.       Assessment & Plan:  #1. Rash. Candida most likely. We'll do oral fluconazole and some topical cream. I recommend against using the powder. #2. Discussed flu shot in setting of immunosuppression. Actually she should be encouraged to take the flu shot which we will give her today #3. Remaining recurrent toenail piece. She can schedule at her convenience in the procedure clinic and I'll try to remove this remnant. It will likely require small incision and some exploration so I would prefer to do in procedure clinic. Would also be more helpful to do  it when the nail remnant has grown out a little bit.

## 2015-02-22 NOTE — Patient Instructions (Signed)
Take one of the fluconazole tablets every other day for 7 doses for this acute outbreak. If you want, then we can start you on prophylaxis which is one tablet every week. If you have another exacerbation, he can go back to the every other day for 7 doses. Call me if this is not healing. Call my nurse in about 6 weeks and get set up in the women's health clinic that I run on Thursdays and we will look at your toenail. Great to see you!

## 2015-03-03 MED FILL — DICLOFENAC SOD EC 75 MG TAB: 75 | 90 days supply | Qty: 180 | Fill #2

## 2015-03-03 MED FILL — VENLAFAXINE HCL ER 75 MG CA: 75 | 90 days supply | Qty: 90 | Fill #1

## 2015-03-03 MED FILL — ALPRAZolam 0.5 MG TABS: 0.5 | 30 days supply | Qty: 90 | Fill #2

## 2015-03-03 MED FILL — VENLAFAXINE HCL ER 150 MG C: 150 | 90 days supply | Qty: 90 | Fill #1

## 2015-03-03 MED FILL — PANTOPRAZOLE SOD DR 40 MG T: 40 | 90 days supply | Qty: 90 | Fill #3

## 2015-03-03 MED FILL — BENAZEPRIL HCL 20 MG TABLET: 20 | 90 days supply | Qty: 90 | Fill #1

## 2015-03-23 MED FILL — AMOXICILLIN 500 MG CAPSULE: 500 | 5 days supply | Qty: 20 | Fill #0

## 2015-04-05 MED FILL — ALPRAZolam 0.5 MG TABS: 0.5 | 30 days supply | Qty: 90 | Fill #3

## 2015-04-05 MED FILL — VALACYCLOVIR HCL 500 MG TAB: 500 | 3 days supply | Qty: 6 | Fill #2

## 2015-05-06 ENCOUNTER — Telehealth: Payer: Self-pay | Admitting: Family Medicine

## 2015-05-06 DIAGNOSIS — Z20828 Contact with and (suspected) exposure to other viral communicable diseases: Secondary | ICD-10-CM

## 2015-05-06 MED ORDER — OSELTAMIVIR PHOSPHATE 75 MG PO CAPS: 75.0000 mg | ORAL_CAPSULE | Freq: Every day | ORAL | Status: DC

## 2015-05-06 NOTE — Telephone Encounter (Signed)
Paged to [facility] Emergency Line approx [phone] on 05/06/15 by patient, Ilean Skill, PMH psoriatic arthritis on remicade immunosuppression. She received her flu shot this season. Reports concern with exposure to influenza with her granddaughter who stayed at her house last night, granddaughter first started URI / flu-like symptoms with cough and fever x 24-48 hours, this was first contact with her this week, granddaughter shared a bed with grandparents last night due to not feeling well, had fever to 102.4F, the granddaughter went to Urgent Care this morning and confirmed on rapid flu test Influenza Type B positive, treated for flu. Now, Tonnette Zatorski calls to get information on prophylaxis treatment with Tamiflu for both her and her husband (see separate note on his chart), she currently states that she is asymptomatic, afebrile, no cough or congestion, myalgias, she is resolved from URI 1 week ago, just recently received her remicade dose. We discussed risks and benefits of Tamiflu, explaining that if she had the flu it may reduce symptoms by 6 to 24 hours, and can have significant GI side effects, mutual decision to send a prescription to [place], for prophylaxis dose Tamiflu 75mg  daily x 10 days (switch to treatment dose if symptomatic within 24-48 hrs, 75mg  BID x 5 days), she will follow-up on Monday with picking up or leaving rx from pharmacy as indicated and check with [facility] if anything else is needed.  Nobie Putnam, [place], PGY-3

## 2015-05-08 MED FILL — FLUCONAZOLE 100 MG TABLET: 100 | 84 days supply | Qty: 21 | Fill #1

## 2015-05-08 MED FILL — VALACYCLOVIR HCL 500 MG TAB: 500 | 3 days supply | Qty: 6 | Fill #3

## 2015-05-08 MED FILL — ALPRAZolam 0.5 MG TABS: 0.5 | 30 days supply | Qty: 90 | Fill #4

## 2015-05-08 MED FILL — OSELTAMIVIR PHOS 75 MG CAP: 75 | 10 days supply | Qty: 10 | Fill #0

## 2015-05-29 MED FILL — VENLAFAXINE HCL ER 150 MG C: 150 | 90 days supply | Qty: 90 | Fill #2

## 2015-05-29 MED FILL — PANTOPRAZOLE SOD DR 40 MG T: 40 | 90 days supply | Qty: 90 | Fill #4

## 2015-05-29 MED FILL — DICLOFENAC SOD EC 75 MG TAB: 75 | 90 days supply | Qty: 180 | Fill #3

## 2015-05-29 MED FILL — BENAZEPRIL HCL 20 MG TABLET: 20 | 90 days supply | Qty: 90 | Fill #2

## 2015-05-29 MED FILL — VENLAFAXINE HCL ER 75 MG CA: 75 | 90 days supply | Qty: 90 | Fill #2

## 2015-05-29 MED FILL — FUROSEMIDE 20 MG TABLET: 20 | 90 days supply | Qty: 180 | Fill #3

## 2015-05-29 MED FILL — VALACYCLOVIR HCL 500 MG TAB: 500 | 3 days supply | Qty: 6 | Fill #4

## 2015-06-21 ENCOUNTER — Other Ambulatory Visit: Payer: Self-pay | Admitting: Family Medicine

## 2015-06-21 NOTE — Telephone Encounter (Signed)
Dear [name] Team Please call this alprazolam in w 5 refills as below [facility]! Dorcas Mcmurray

## 2015-06-26 MED FILL — ALPRAZolam 0.5 MG TABS: 0.5 | 30 days supply | Qty: 90 | Fill #0

## 2015-06-26 MED FILL — MUPIROCIN 2% OINTMENT: 2 | 10 days supply | Qty: 22 | Fill #0

## 2015-06-26 NOTE — Telephone Encounter (Signed)
Rx called in to [facility]. Ottis Stain, CMA

## 2015-08-02 ENCOUNTER — Ambulatory Visit (INDEPENDENT_AMBULATORY_CARE_PROVIDER_SITE_OTHER): Payer: BLUE CROSS/BLUE SHIELD | Admitting: Family Medicine

## 2015-08-02 ENCOUNTER — Encounter: Payer: Self-pay | Admitting: Family Medicine

## 2015-08-02 VITALS — BP 97/53 | HR 71 | Temp 98.1°F | Ht 60.0 in | Wt 212.8 lb

## 2015-08-02 DIAGNOSIS — F410 Panic disorder [episodic paroxysmal anxiety] without agoraphobia: Secondary | ICD-10-CM | POA: Diagnosis not present

## 2015-08-02 DIAGNOSIS — Z0000 Encounter for general adult medical examination without abnormal findings: Secondary | ICD-10-CM

## 2015-08-02 DIAGNOSIS — I10 Essential (primary) hypertension: Secondary | ICD-10-CM | POA: Diagnosis not present

## 2015-08-02 DIAGNOSIS — L4050 Arthropathic psoriasis, unspecified: Secondary | ICD-10-CM | POA: Diagnosis not present

## 2015-08-02 MED ORDER — MICONAZOLE-ZINC OXIDE-PETROLAT 0.25-15-81.35 % EX OINT: TOPICAL_OINTMENT | CUTANEOUS | Status: DC

## 2015-08-02 NOTE — Patient Instructions (Signed)
I will send you a note about your labs Great to see yoU! I will call in your vusion, and your shampoo Use the mupirocin on the ear lesion

## 2015-08-03 LAB — COMPLETE METABOLIC PANEL WITH GFR
ALBUMIN: 3.6 g/dL (ref 3.6–5.1)
ALK PHOS: 83 U/L (ref 33–130)
ALT: 23 U/L (ref 6–29)
AST: 28 U/L (ref 10–35)
BILIRUBIN TOTAL: 0.3 mg/dL (ref 0.2–1.2)
BUN: 17 mg/dL (ref 7–25)
CALCIUM: 9.1 mg/dL (ref 8.6–10.4)
CO2: 23 mmol/L (ref 20–31)
CREATININE: 0.55 mg/dL (ref 0.50–0.99)
Chloride: 103 mmol/L (ref 98–110)
Glucose, Bld: 89 mg/dL (ref 65–99)
Potassium: 4.5 mmol/L (ref 3.5–5.3)
Sodium: 140 mmol/L (ref 135–146)
TOTAL PROTEIN: 6.5 g/dL (ref 6.1–8.1)

## 2015-08-03 LAB — LDL CHOLESTEROL, DIRECT: Direct LDL: 139 mg/dL — ABNORMAL HIGH (ref <130)

## 2015-08-03 MED ORDER — SALICYLIC ACID 2 % EX SHAM: MEDICATED_SHAMPOO | Freq: Every day | CUTANEOUS | Status: DC | PRN

## 2015-08-03 MED FILL — PAZOL XS MEDICATED SHAMPOO: 1-2-2 | 20 days supply | Qty: 118 | Fill #0

## 2015-08-03 MED FILL — FLUCONAZOLE 100 MG TABLET: 100 | 84 days supply | Qty: 21 | Fill #2

## 2015-08-03 MED FILL — ALPRAZolam 0.5 MG TABS: 0.5 | 30 days supply | Qty: 90 | Fill #1

## 2015-08-03 NOTE — Assessment & Plan Note (Signed)
She is up-to-date on most of her health maintenance.

## 2015-08-03 NOTE — Assessment & Plan Note (Signed)
Well-controlled. Her blood pressures actually little low today and I asked her if she wanted to decrease to 10 mg resident 20. She says occasionally when she gets her infusions for her rheumatoid arthritis her blood pressure is in the 03/10[birth year]1928 range so currently she like to keep the dose stable.

## 2015-08-03 NOTE — Assessment & Plan Note (Signed)
Improved since she retired. Well-controlled on her current medication regimen

## 2015-08-03 NOTE — Progress Notes (Signed)
   Subjective:    Patient ID: [name], female    DOB: 01-03-1951, 65 y.o.   MRN: [id number]  HPI  #1. Here for well adult health check #2. Is being followed by rheumatology for autoimmune joint disease initially thought to be psoriasis and most recently has been reclassified as a very of rheumatoid arthritis. She evidently had some elevated liver functions that her rheumatologist was following but otherwise has been doing well with the medications. Next #3. Notices that she gets sores in her scalp and she cannot keep herself from picking at them. Says she must have a little bit of obsessive-compulsive disorder K she keeps being anatomy even though she know she shouldn't. She also has a similar sore in her right external ear or canal. #4. Anxiety and depression: Currently stable on her medication regimen without a lot of breakthrough symptoms. No desire to change current regimen. #5. Social. She is enjoying her retirement.  Review of Systems  Constitutional: Negative for activity change, appetite change, fatigue and unexpected weight change.  HENT: Negative for trouble swallowing.   Eyes: Negative for pain and visual disturbance.  Respiratory: Negative for chest tightness, shortness of breath and wheezing.   Cardiovascular: Negative for chest pain, palpitations and leg swelling.  Gastrointestinal: Negative for nausea, abdominal pain, diarrhea and constipation.  Genitourinary: Negative for dysuria, frequency and vaginal discharge.  Musculoskeletal: Positive for arthralgias.  Neurological: Negative for speech difficulty, weakness, light-headedness and headaches.  Psychiatric/Behavioral: Negative for hallucinations, sleep disturbance, dysphoric mood, decreased concentration and agitation. The patient is not nervous/anxious.        Objective:   Physical Exam  Constitutional: She is oriented to person, place, and time. She appears well-developed and well-nourished.  HENT:  Right external  ear canal has a small unroofed abrasion. No sign of infection.  Eyes: Conjunctivae and EOM are normal. Pupils are equal, round, and reactive to light. No scleral icterus.  Neck: Normal range of motion. Neck supple. No JVD present. No thyromegaly present.  Cardiovascular: Normal rate, regular rhythm, normal heart sounds and intact distal pulses.   No murmur heard. Pulmonary/Chest: Effort normal and breath sounds normal. She has no wheezes.  Abdominal: Soft. Bowel sounds are normal. There is no tenderness.  Musculoskeletal: Normal range of motion. She exhibits no edema.  Lymphadenopathy:    She has no cervical adenopathy.  Neurological: She is alert and oriented to person, place, and time. She displays normal reflexes.  Skin:  Several excoriated lesions in the scalp  Psychiatric: She has a normal mood and affect. Her behavior is normal. Judgment and thought content normal.          Assessment & Plan:

## 2015-08-04 ENCOUNTER — Encounter: Payer: Self-pay | Admitting: Family Medicine

## 2015-08-04 ENCOUNTER — Ambulatory Visit (INDEPENDENT_AMBULATORY_CARE_PROVIDER_SITE_OTHER): Payer: BLUE CROSS/BLUE SHIELD | Admitting: Family Medicine

## 2015-08-04 VITALS — BP 102/49 | Ht 60.0 in | Wt 212.0 lb

## 2015-08-04 DIAGNOSIS — M7661 Achilles tendinitis, right leg: Secondary | ICD-10-CM

## 2015-08-04 DIAGNOSIS — M129 Arthropathy, unspecified: Secondary | ICD-10-CM | POA: Diagnosis not present

## 2015-08-04 DIAGNOSIS — M19019 Primary osteoarthritis, unspecified shoulder: Secondary | ICD-10-CM

## 2015-08-04 MED ORDER — METHYLPREDNISOLONE ACETATE 40 MG/ML IJ SUSP
40.0000 mg | Freq: Once | INTRAMUSCULAR | Status: AC
  Administered 2015-08-04: 40 mg via INTRA_ARTICULAR

## 2015-08-04 NOTE — Patient Instructions (Signed)
Wear the lift in your shoe for next 3 weeks.  AFTER 1 week of rest, start the exercises and do them twice a day. You may want to get some OTC Aspercream to use topically on both the ankle and the [facility] jioint--once or twice a day  Let me see you back in about 3-4 weeks here at [facility].

## 2015-08-05 NOTE — Progress Notes (Signed)
   Subjective:    Patient ID: [name], female    DOB: 03-26-1950, 65 y.o.   MRN: [id number]  HPI CC. Right ankle pain and right shoulder pain. 1. ANKLE: painful last 1-2 weeks. Worse after walking a lot. Wears sandals most of the time.. Pain is posterior, on either side of achilles. No specific injury. No prior problems with ankle or foot.  2. SHOULDER pain with elevation of arm above shoulder level. Has been getting worse last 1-2 months. Right hand dominant. No specific injury. No prior  Problems or surgeries. Sometimes bothers her at night Pain is 4-6/10 at its worst.    Review of Systems Pertinent review of systems: negative for fever or unusual weight change.     Objective:   Physical Exam  PERTINENT  PMH / PSH: I have reviewed the patient's medications, allergies, past medical and surgical history. Pertinent findings that relate to today's visit / issues include: Psoriatic vs rheumatoid arthritis followed by Rheumatology.  .Vital signs reviewed. GENERAL: Well developed, well nourished, no acute distress ANKLE ROGHT: FROM that is painless. TTP on eother side of achilles and some over area of achilles unsertion. No defect. Full strength.  SHOULDER: right: ttp over AC joint. Crossover test is painful. Rot ator muscle strength  intact in alll planes. VASC: DP and posterior tibialis pulses 2+ B=; radial pulses 2 +B= SKIN ni erythema, no rash, no unusual lesions or echymoses on skin right ankle or right shoulder  [place]: achilles tendon is intact. Mild increase in doppler activity at insertion. No fluid in retrocalcaneal bursa. Calf muscles intact and normal in appearance. SHOULDER: AC joint has large effusion (mushroom sign) and synovial cap. Rotator cuff muscles are intact. A few minor calcifications seen in suppraspinatus. Bicep tendon is intact and no siign of fluid in tendon sheath.  INJECTION: Patient was given informed consent, signed copy in the chart. Appropriate time out  was taken. Area prepped and draped in usual sterile fashion. 1 cc of methylprednisolone 40 mg/ml plus  1 cc of 1% lidocaine without epinephrine was injected into the RIGHT AC joint using a(n) utrasound guided approach. The patient tolerated the procedure well. There were no complications. Post procedure instructions were given.      Assessment & Plan:  1. Shoulder pain: large component of this is AC joint arthropathy. We did CSI under US guidance today.the synovial cap over the joint was quite form and injection was somewhat difficult. 2. Ankle pain is mostly related to achilles tendinopathy at insertion site. Will treat with heel lift and rst 1 week then heel lift and HEP. Topical Aspercream. Ice. F/u 3 weeks

## 2015-08-07 ENCOUNTER — Encounter: Payer: Self-pay | Admitting: Family Medicine

## 2015-08-25 ENCOUNTER — Encounter: Payer: Self-pay | Admitting: Family Medicine

## 2015-08-25 ENCOUNTER — Ambulatory Visit (INDEPENDENT_AMBULATORY_CARE_PROVIDER_SITE_OTHER): Payer: BLUE CROSS/BLUE SHIELD | Admitting: Family Medicine

## 2015-08-25 ENCOUNTER — Other Ambulatory Visit: Payer: Self-pay | Admitting: Family Medicine

## 2015-08-25 VITALS — BP 112/62 | HR 71 | Ht 60.0 in | Wt 212.0 lb

## 2015-08-25 DIAGNOSIS — M19019 Primary osteoarthritis, unspecified shoulder: Secondary | ICD-10-CM

## 2015-08-25 DIAGNOSIS — M129 Arthropathy, unspecified: Secondary | ICD-10-CM | POA: Diagnosis not present

## 2015-08-25 DIAGNOSIS — M25511 Pain in right shoulder: Secondary | ICD-10-CM

## 2015-08-25 MED ORDER — METHYLPREDNISOLONE ACETATE 40 MG/ML IJ SUSP
40.0000 mg | Freq: Once | INTRAMUSCULAR | Status: AC
  Administered 2015-08-25: 40 mg via INTRA_ARTICULAR

## 2015-08-25 MED FILL — PANTOPRAZOLE SOD DR 40 MG T: 40 | 90 days supply | Qty: 90 | Fill #0

## 2015-08-25 MED FILL — VENLAFAXINE HCL ER 150 MG C: 150 | 90 days supply | Qty: 90 | Fill #3

## 2015-08-25 MED FILL — DICLOFENAC SOD EC 75 MG TAB: 75 | 90 days supply | Qty: 180 | Fill #0

## 2015-08-25 MED FILL — BENAZEPRIL HCL 20 MG TABLET: 20 | 90 days supply | Qty: 90 | Fill #3

## 2015-08-25 MED FILL — VALACYCLOVIR HCL 500 MG TAB: 500 | 3 days supply | Qty: 6 | Fill #5

## 2015-08-25 MED FILL — VENLAFAXINE HCL ER 75 MG CA: 75 | 90 days supply | Qty: 90 | Fill #3

## 2015-08-28 MED FILL — ALPRAZolam 0.5 MG TABS: 0.5 | 30 days supply | Qty: 90 | Fill #2

## 2015-08-29 DIAGNOSIS — M19019 Primary osteoarthritis, unspecified shoulder: Secondary | ICD-10-CM | POA: Insufficient documentation

## 2015-08-29 NOTE — Progress Notes (Signed)
   Subjective:    Patient ID: [name], female    DOB: 08-14-1950, 65 y.o.   MRN: [id number]  HPI Right shoulder pain. At last office visit we gave her an acromioclavicular injection under ultrasound guidance after reviewing her joint on ultrasound which showed a lot of arthropathy. She had improvement for about 2-1/2 or 3 days and then the pain is return to baseline which is 67 out of 10. Worse with certain activities such as reaching forward or cross. Also bothers her some night.   Review of Systems No unusual weight change fever, sweats, chills. She's noted no erythema or warmth that is unusual in her right shoulder.    Objective:   Physical Exam Vital signs reviewed GEN.: Well-developed female no acute distress SHOULDER: Bilaterally symmetrical. The acromioclavicular joint is still mild tender to palpation. O'Brien's test is somewhat painful. She has full strength in all planes the rotator cuff. VASCULAR: Radial pulses 2+ bilaterally equal SKIN: Area over the right shoulder in the right acromioclavicular joint are without any sign of lesions, rash, no unusual erythema or warmth. NEURO: Intact sensation to soft touch bilateral hands.  INJECTION: Patient was given informed consent, signed copy in the chart. Appropriate time out was taken. Area prepped and draped in usual sterile fashion. 1 cc of methylprednisolone 40 mg/ml plus  4 cc of 1% lidocaine without epinephrine was injected into the right subacromial bursa using a(n) posterior approach. The patient tolerated the procedure well. There were no complications. Post procedure instructions were given.      Assessment & Plan:  Acromioclavicular arthropathy

## 2015-08-29 NOTE — Assessment & Plan Note (Signed)
Her ultrasound imaging was consistent with long-standing arthropathy with synovitis, mushrooms sign, effusion. The corticosteroid injection only helped for a few days so I think at this point we need her to see orthopedics for further evaluation and management. She was having quite a bit of pain today and some of that may be related to subacromial bursitis, we decided to do corticosteroid injection there today as well. Hopefully this will give her some relief until she seemed by orthopedics.

## 2015-09-15 ENCOUNTER — Encounter: Payer: Self-pay | Admitting: Family Medicine

## 2015-10-26 MED FILL — ALPRAZolam 0.5 MG TABS: 0.5 | 30 days supply | Qty: 90 | Fill #3

## 2015-10-26 MED FILL — VALACYCLOVIR HCL 500 MG TAB: 500 | 3 days supply | Qty: 6 | Fill #6

## 2015-10-26 MED FILL — FLUCONAZOLE 100 MG TABLET: 100 | 84 days supply | Qty: 21 | Fill #3

## 2015-10-30 ENCOUNTER — Encounter: Payer: Self-pay | Admitting: Family Medicine

## 2015-10-30 MED FILL — VIT D2 1.25 MG (50,000 UNIT: 1.25 MG | 28 days supply | Qty: 8 | Fill #0

## 2015-11-13 ENCOUNTER — Ambulatory Visit (INDEPENDENT_AMBULATORY_CARE_PROVIDER_SITE_OTHER): Payer: BLUE CROSS/BLUE SHIELD | Admitting: *Deleted

## 2015-11-13 DIAGNOSIS — D510 Vitamin B12 deficiency anemia due to intrinsic factor deficiency: Secondary | ICD-10-CM | POA: Diagnosis not present

## 2015-11-13 DIAGNOSIS — Z23 Encounter for immunization: Secondary | ICD-10-CM

## 2015-11-13 MED ORDER — CYANOCOBALAMIN 1000 MCG/ML IJ SOLN
1000.0000 ug | Freq: Once | INTRAMUSCULAR | Status: AC
  Administered 2015-11-13: 1000 ug via INTRAMUSCULAR

## 2015-11-13 NOTE — Progress Notes (Signed)
   Patient in nurse clinic for Vitamin B-12 injection and flu shot.  Patient stated she had lab work at another practice and had low iron level.  Patient stated she was Vitamin B-12 in the past at least once a week.  Precept with Dr. Nori Riis; patient contacted her regarding the injections.  Ok for Vitamin B-12 1000 mcg IM x 1 today; then Vitamin B-12 1000 mcg IM once a month.  Pt advised to have lab results faxed to bring a copy to her next visit.  Patient also advised to schedule a follow up with PCP.  Derl Barrow, RN

## 2015-11-20 ENCOUNTER — Ambulatory Visit: Payer: BLUE CROSS/BLUE SHIELD

## 2015-11-20 ENCOUNTER — Other Ambulatory Visit: Payer: Self-pay | Admitting: Family Medicine

## 2015-11-22 ENCOUNTER — Other Ambulatory Visit: Payer: Self-pay | Admitting: *Deleted

## 2015-11-22 MED ORDER — VALACYCLOVIR HCL 500 MG PO TABS: ORAL_TABLET | ORAL | 99 refills | Status: DC

## 2015-11-22 MED FILL — VALACYCLOVIR HCL 500 MG TAB: 500 | 3 days supply | Qty: 6 | Fill #0

## 2015-11-23 MED FILL — ALPRAZolam 0.5 MG TABS: 0.5 | 30 days supply | Qty: 90 | Fill #4

## 2015-11-24 ENCOUNTER — Other Ambulatory Visit: Payer: Self-pay | Admitting: Family Medicine

## 2015-11-24 DIAGNOSIS — Z1231 Encounter for screening mammogram for malignant neoplasm of breast: Secondary | ICD-10-CM

## 2015-11-27 ENCOUNTER — Other Ambulatory Visit: Payer: Self-pay | Admitting: Family Medicine

## 2015-11-27 ENCOUNTER — Ambulatory Visit: Payer: BLUE CROSS/BLUE SHIELD

## 2015-11-27 MED FILL — VIT D2 1.25 MG (50,000 UNIT: 1.25 MG | 28 days supply | Qty: 8 | Fill #1

## 2015-11-27 MED FILL — BENAZEPRIL HCL 20 MG TABLET: 20 | 90 days supply | Qty: 90 | Fill #0

## 2015-11-27 MED FILL — VENLAFAXINE HCL ER 150 MG C: 150 | 90 days supply | Qty: 90 | Fill #0

## 2015-11-27 MED FILL — DICLOFENAC SOD 75 MG TAB EC: 75 | 90 days supply | Qty: 180 | Fill #1

## 2015-11-27 MED FILL — VENLAFAXINE HCL ER 75 MG CA: 75 | 90 days supply | Qty: 90 | Fill #4

## 2015-12-04 ENCOUNTER — Ambulatory Visit: Payer: BLUE CROSS/BLUE SHIELD

## 2015-12-05 ENCOUNTER — Encounter: Payer: Self-pay | Admitting: Family Medicine

## 2015-12-05 DIAGNOSIS — E559 Vitamin D deficiency, unspecified: Secondary | ICD-10-CM | POA: Insufficient documentation

## 2015-12-05 DIAGNOSIS — Z9289 Personal history of other medical treatment: Secondary | ICD-10-CM | POA: Insufficient documentation

## 2015-12-05 DIAGNOSIS — E538 Deficiency of other specified B group vitamins: Secondary | ICD-10-CM | POA: Insufficient documentation

## 2015-12-15 ENCOUNTER — Ambulatory Visit
Admission: RE | Admit: 2015-12-15 | Discharge: 2015-12-15 | Disposition: A | Discharge status: Home / Self Care | Payer: BLUE CROSS/BLUE SHIELD | Source: Ambulatory Visit | Attending: Family Medicine | Admitting: Family Medicine

## 2015-12-15 DIAGNOSIS — Z1231 Encounter for screening mammogram for malignant neoplasm of breast: Secondary | ICD-10-CM

## 2015-12-19 ENCOUNTER — Ambulatory Visit: Payer: BLUE CROSS/BLUE SHIELD

## 2015-12-20 ENCOUNTER — Ambulatory Visit (INDEPENDENT_AMBULATORY_CARE_PROVIDER_SITE_OTHER): Payer: BLUE CROSS/BLUE SHIELD | Admitting: *Deleted

## 2015-12-20 DIAGNOSIS — D510 Vitamin B12 deficiency anemia due to intrinsic factor deficiency: Secondary | ICD-10-CM

## 2015-12-20 MED ORDER — CYANOCOBALAMIN 1000 MCG/ML IJ SOLN
1000.0000 ug | Freq: Once | INTRAMUSCULAR | Status: AC
  Administered 2015-12-20: 1000 ug via INTRAMUSCULAR

## 2015-12-20 NOTE — Progress Notes (Signed)
Patient presents for B12 injection today.  Tolerated injection well.  Rainie Crenshaw,CMA

## 2016-01-06 ENCOUNTER — Telehealth: Payer: BLUE CROSS/BLUE SHIELD | Admitting: Nurse Practitioner

## 2016-01-06 DIAGNOSIS — R059 Cough, unspecified: Secondary | ICD-10-CM

## 2016-01-06 DIAGNOSIS — R05 Cough: Secondary | ICD-10-CM

## 2016-01-06 MED ORDER — ALBUTEROL SULFATE HFA 108 (90 BASE) MCG/ACT IN AERS: 2.0000 | INHALATION_SPRAY | Freq: Four times a day (QID) | RESPIRATORY_TRACT | 0 refills | Status: DC | PRN

## 2016-01-06 MED ORDER — DOXYCYCLINE HYCLATE 100 MG PO TABS: 100.0000 mg | ORAL_TABLET | Freq: Two times a day (BID) | ORAL | 0 refills | Status: DC

## 2016-01-06 MED ORDER — PREDNISONE 10 MG (21) PO TBPK: ORAL_TABLET | ORAL | 0 refills | Status: DC

## 2016-01-06 MED ORDER — BENZONATATE 100 MG PO CAPS: 100.0000 mg | ORAL_CAPSULE | Freq: Three times a day (TID) | ORAL | 0 refills | Status: DC | PRN

## 2016-01-06 NOTE — Addendum Note (Signed)
Addended by: Chevis Pretty on: 01/06/2016 09:23 AM   Modules accepted: Orders

## 2016-01-06 NOTE — Addendum Note (Signed)
Addended by: Chevis Pretty on: 01/06/2016 06:38 PM   Modules accepted: Orders

## 2016-01-06 NOTE — Progress Notes (Signed)
We are sorry that you are not feeling well.  Here is how we plan to help!  Based on what you have shared with me it looks like you have upper respiratory tract inflammation that has resulted in a significant cough.  Inflammation and infection in the upper respiratory tract is commonly called bronchitis and has four common causes:  Allergies, Viral Infections, Acid Reflux and Bacterial Infections.  Allergies, viruses and acid reflux are treated by controlling symptoms or eliminating the cause. An example might be a cough caused by taking certain blood pressure medications. You stop the cough by changing the medication. Another example might be a cough caused by acid reflux. Controlling the reflux helps control the cough.  Based on your presentation I believe you most likely have A cough due to bacteria.  When patients have a fever and a productive cough with a change in color or increased sputum production, we are concerned about bacterial bronchitis.  If left untreated it can progress to pneumonia.  If your symptoms do not improve with your treatment plan it is important that you contact your provider.   I hve prescribed Doxycycline 100 mg twice a day for 7 days     In addition you may use A prescription cough medication called Tessalon Perles 100mg. You may take 1-2 capsules every 8 hours as needed for your cough.  Sterapred 10 mg dosepak  USE OF BRONCHODILATOR ("RESCUE") INHALERS: There is a risk from using your bronchodilator too frequently.  The risk is that over-reliance on a medication which only relaxes the muscles surrounding the breathing tubes can reduce the effectiveness of medications prescribed to reduce swelling and congestion of the tubes themselves.  Although you feel brief relief from the bronchodilator inhaler, your asthma may actually be worsening with the tubes becoming more swollen and filled with mucus.  This can delay other crucial treatments, such as oral steroid medications. If  you need to use a bronchodilator inhaler daily, several times per day, you should discuss this with your provider.  There are probably better treatments that could be used to keep your asthma under control.     HOME CARE . Only take medications as instructed by your medical team. . Complete the entire course of an antibiotic. . Drink plenty of fluids and get plenty of rest. . Avoid close contacts especially the very young and the elderly . Cover your mouth if you cough or cough into your sleeve. . Always remember to wash your hands . A steam or ultrasonic humidifier can help congestion.   GET HELP RIGHT AWAY IF: . You develop worsening fever. . You become short of breath . You cough up blood. . Your symptoms persist after you have completed your treatment plan MAKE SURE YOU   Understand these instructions.  Will watch your condition.  Will get help right away if you are not doing well or get worse.  Your e-visit answers were reviewed by a board certified advanced clinical practitioner to complete your personal care plan.  Depending on the condition, your plan could have included both over the counter or prescription medications. If there is a problem please reply  once you have received a response from your provider. Your safety is important to us.  If you have drug allergies check your prescription carefully.    You can use MyChart to ask questions about today's visit, request a non-urgent call back, or ask for a work or school excuse for 24 hours related to   this e-Visit. If it has been greater than 24 hours you will need to follow up with your provider, or enter a new e-Visit to address those concerns. You will get an e-mail in the next two days asking about your experience.  I hope that your e-visit has been valuable and will speed your recovery. Thank you for using e-visits.

## 2016-01-17 ENCOUNTER — Ambulatory Visit (INDEPENDENT_AMBULATORY_CARE_PROVIDER_SITE_OTHER): Payer: BLUE CROSS/BLUE SHIELD | Admitting: *Deleted

## 2016-01-17 DIAGNOSIS — E538 Deficiency of other specified B group vitamins: Secondary | ICD-10-CM | POA: Diagnosis not present

## 2016-01-17 MED ORDER — CYANOCOBALAMIN 1000 MCG/ML IJ SOLN
1000.0000 ug | Freq: Once | INTRAMUSCULAR | Status: AC
  Administered 2016-01-17: 1000 ug via INTRAMUSCULAR

## 2016-01-17 NOTE — Progress Notes (Signed)
Patient in today for Vitamin B12. Injection given in left deltoid. Patient tolerated well without complaints.

## 2016-01-18 ENCOUNTER — Ambulatory Visit: Payer: BLUE CROSS/BLUE SHIELD

## 2016-01-26 ENCOUNTER — Other Ambulatory Visit: Payer: Self-pay | Admitting: Family Medicine

## 2016-01-26 MED FILL — FLUCONAZOLE 100 MG TABLET: 100 | 84 days supply | Qty: 21 | Fill #0 | Status: TO

## 2016-01-26 MED FILL — AMOXICILLIN 500 MG CAPSULE: 500 | 5 days supply | Qty: 20 | Fill #1

## 2016-01-26 MED FILL — VALACYCLOVIR HCL 500 MG TAB: 500 | 3 days supply | Qty: 6 | Fill #1

## 2016-01-26 NOTE — Telephone Encounter (Signed)
Dear [name] Team Can u call in the  Alprazolam please? THANKS! Dorcas Mcmurray

## 2016-01-29 MED FILL — ALPRAZolam 0.5 MG TABS: 0.5 | 30 days supply | Qty: 90 | Fill #0

## 2016-01-31 NOTE — Telephone Encounter (Signed)
Rx called in. [name] [name], CMA  

## 2016-02-16 ENCOUNTER — Ambulatory Visit (INDEPENDENT_AMBULATORY_CARE_PROVIDER_SITE_OTHER): Payer: BLUE CROSS/BLUE SHIELD | Admitting: *Deleted

## 2016-02-16 ENCOUNTER — Other Ambulatory Visit: Payer: Self-pay | Admitting: *Deleted

## 2016-02-16 DIAGNOSIS — Z23 Encounter for immunization: Secondary | ICD-10-CM | POA: Diagnosis not present

## 2016-02-16 DIAGNOSIS — E538 Deficiency of other specified B group vitamins: Secondary | ICD-10-CM | POA: Diagnosis not present

## 2016-02-16 DIAGNOSIS — L4050 Arthropathic psoriasis, unspecified: Secondary | ICD-10-CM

## 2016-02-16 MED ORDER — ESOMEPRAZOLE MAGNESIUM 20 MG PO CPDR: 20.0000 mg | DELAYED_RELEASE_CAPSULE | Freq: Every day | ORAL | 3 refills | Status: DC

## 2016-02-16 MED ORDER — BENAZEPRIL HCL 20 MG PO TABS: 20.0000 mg | ORAL_TABLET | Freq: Every day | ORAL | 3 refills | Status: DC

## 2016-02-16 MED ORDER — MICONAZOLE-ZINC OXIDE-PETROLAT 0.25-15-81.35 % EX OINT: TOPICAL_OINTMENT | CUTANEOUS | 5 refills | Status: DC

## 2016-02-16 MED ORDER — PANTOPRAZOLE SODIUM 40 MG PO TBEC: 40.0000 mg | DELAYED_RELEASE_TABLET | Freq: Every day | ORAL | 3 refills | Status: DC

## 2016-02-16 MED ORDER — TOLTERODINE TARTRATE 2 MG PO TABS: 2.0000 mg | ORAL_TABLET | Freq: Two times a day (BID) | ORAL | 3 refills | Status: DC | PRN

## 2016-02-16 MED ORDER — CLOTRIMAZOLE 1 % EX OINT: TOPICAL_OINTMENT | CUTANEOUS | 12 refills | Status: DC

## 2016-02-16 MED ORDER — DICLOFENAC SODIUM 75 MG PO TBEC: DELAYED_RELEASE_TABLET | ORAL | 3 refills | Status: DC

## 2016-02-16 MED ORDER — FLUTICASONE PROPIONATE 50 MCG/ACT NA SUSP: 1.0000 | Freq: Every day | NASAL | 3 refills | Status: DC

## 2016-02-16 MED ORDER — VENLAFAXINE HCL ER 150 MG PO CP24: 150.0000 mg | ORAL_CAPSULE | Freq: Every day | ORAL | 3 refills | Status: DC

## 2016-02-16 MED ORDER — FUROSEMIDE 20 MG PO TABS: ORAL_TABLET | ORAL | 3 refills | Status: DC

## 2016-02-16 MED ORDER — VALACYCLOVIR HCL 500 MG PO TABS: ORAL_TABLET | ORAL | 99 refills | Status: DC

## 2016-02-16 MED ORDER — CYANOCOBALAMIN 1000 MCG/ML IJ SOLN
1000.0000 ug | Freq: Once | INTRAMUSCULAR | Status: AC
  Administered 2016-02-16: 1000 ug via INTRAMUSCULAR

## 2016-02-16 MED ORDER — SALICYLIC ACID 2 % EX SHAM: MEDICATED_SHAMPOO | Freq: Every day | CUTANEOUS | 3 refills | Status: DC | PRN

## 2016-02-16 NOTE — Progress Notes (Signed)
Patient in today for monthy B12 injection. Injection given in left deltoid, patient without complaints, site unremarkable. Patient also due for Tdap vaccination as last one was in 04/2005. Immunization given in right deltoid, patient without complaints, site unremarkable.

## 2016-02-16 NOTE — Telephone Encounter (Signed)
Patient would like 64-month supply of medication sent to [facility]. [place]

## 2016-02-23 ENCOUNTER — Encounter: Payer: Self-pay | Admitting: Family Medicine

## 2016-02-27 ENCOUNTER — Encounter: Payer: Self-pay | Admitting: Family Medicine

## 2016-02-27 ENCOUNTER — Telehealth: Payer: Self-pay | Admitting: *Deleted

## 2016-02-27 MED ORDER — PYRITHIONE ZINC 1 % EX SHAM: MEDICATED_SHAMPOO | CUTANEOUS | 12 refills | Status: DC

## 2016-02-27 MED FILL — AMOXICILLIN 500 MG CAPSULE: 500 | 5 days supply | Qty: 20 | Fill #2

## 2016-02-27 MED FILL — ALPRAZolam 0.5 MG TABS: 0.5 | 30 days supply | Qty: 90 | Fill #1

## 2016-02-27 NOTE — Telephone Encounter (Signed)
Received faxed request from [facility] on [employer] (Ph [phone] Fax [phone]) for drug change. Previously prescribed shampoo not covered by patient plan. "The preferred alternative is P&S shampoo 236 mL. Please call/fax the pharmacy to change medication along with strength, directions, quantity and refills."  L. Silvano Rusk, RN, BSN

## 2016-03-01 MED FILL — MUPIROCIN 2% OINTMENT: 2 | 10 days supply | Qty: 22 | Fill #1

## 2016-03-05 ENCOUNTER — Other Ambulatory Visit: Payer: Self-pay | Admitting: Family Medicine

## 2016-03-05 MED ORDER — VENLAFAXINE HCL ER 75 MG PO CP24: ORAL_CAPSULE | ORAL | 3 refills | Status: DC

## 2016-03-05 MED ORDER — ALPRAZOLAM 0.5 MG PO TABS: 0.5000 mg | ORAL_TABLET | Freq: Three times a day (TID) | ORAL | 5 refills | Status: DC | PRN

## 2016-03-05 NOTE — Progress Notes (Signed)
Xanax 0.5 mg tablet: #90 , 5 refill. 1 tablet by mouth TID as needed per Dr. Nori Riis called into [facility].  Derl Barrow, RN

## 2016-03-05 NOTE — Progress Notes (Signed)
RN team Can u call in her alprazolam refills as above THANKS! [name]

## 2016-03-22 ENCOUNTER — Encounter: Payer: Self-pay | Admitting: Family Medicine

## 2016-03-22 ENCOUNTER — Ambulatory Visit (INDEPENDENT_AMBULATORY_CARE_PROVIDER_SITE_OTHER): Payer: BLUE CROSS/BLUE SHIELD | Admitting: Family Medicine

## 2016-03-22 DIAGNOSIS — M25512 Pain in left shoulder: Secondary | ICD-10-CM | POA: Diagnosis not present

## 2016-03-22 DIAGNOSIS — M25511 Pain in right shoulder: Secondary | ICD-10-CM | POA: Diagnosis not present

## 2016-03-22 NOTE — Progress Notes (Signed)
  [name] - 66 y.o. female MRN [id number]  Date of birth: [birth year]    SUBJECTIVE:      Chief Complaint:/ HPI:   Left shoulder pain. Problems with elevation above shoulder height. Similar symptoms to those she had on her right shoulder many years ago she had rotator cuff tear. Symptoms are not present all the time but are aggravating. She wants to get ahead of the problem.  Regarding her right shoulder, she was seen by Dr. Laurina Bustle who recommended she have total shoulder surgery including reverse prosthesis. She and the physician both want but this off as long as possible. She continues to use her right hand as much as possible when she is right-hand dominant.   ROS:     No numbness or tingling in her left hand. She's had no fever. No unusual weight change. Has noted no rash on the left shoulder area.  PERTINENT  PMH / PSH FH / / SH:  Past Medical, Surgical, Social, and Family History Reviewed & Updated in the EMR.  Pertinent findings include:  History of total left knee replacement Hypertension GERD   OBJECTIVE: BP (!) 96/59   Pulse 71   Ht 4\' 11"  (1.499 m)   Wt 211 lb (95.7 kg)   BMI 42.62 kg/m   Physical Exam:  Vital signs are reviewed. GEN.: Well-developed female no acute distress, overweight SHOULDERS: Symmetrical. Right shoulder has essentially full range of motion but she has some pain with elevation above 100 in forward flexion, 95 in abduction. Internal rotation is limited to place her hand at that lateral hip. Left shoulder has full range of motion. Mild pain with resisted supraspinatus testing. There is no tenderness over the left acromioclavicular joint VASCULAR: Radial pulse 2+ bilaterally symmetrical NEURO: Intact sensation soft touch bilaterally in the hands.  ASSESSMENT & PLAN:  See problem based charting & AVS for pt instructions.

## 2016-03-22 NOTE — Assessment & Plan Note (Signed)
We discussed options. Certainly I agree that getting ahead of the game may prevent some similar issues with the left shoulder. We'll place her on home exercise program for rotator cuff strengthening. Recommend also considering doing this on the right side as well. If she's not having some significant improvement for 6 weeks she'll return we'll consider glucocorticoid injection and subacromial bursa.

## 2016-04-02 ENCOUNTER — Encounter (HOSPITAL_COMMUNITY): Payer: Self-pay | Admitting: Emergency Medicine

## 2016-04-02 ENCOUNTER — Ambulatory Visit (INDEPENDENT_AMBULATORY_CARE_PROVIDER_SITE_OTHER): Payer: BLUE CROSS/BLUE SHIELD | Admitting: Family Medicine

## 2016-04-02 ENCOUNTER — Emergency Department (HOSPITAL_COMMUNITY)
Admission: EM | Admit: 2016-04-02 | Discharge: 2016-04-02 | Disposition: A | Discharge status: Home / Self Care | Payer: BLUE CROSS/BLUE SHIELD | Attending: Emergency Medicine | Admitting: Emergency Medicine

## 2016-04-02 ENCOUNTER — Emergency Department (HOSPITAL_COMMUNITY): Payer: BLUE CROSS/BLUE SHIELD

## 2016-04-02 ENCOUNTER — Encounter: Payer: Self-pay | Admitting: Family Medicine

## 2016-04-02 VITALS — BP 92/52 | HR 81 | Temp 98.2°F | Ht 59.0 in | Wt 217.8 lb

## 2016-04-02 DIAGNOSIS — Z96652 Presence of left artificial knee joint: Secondary | ICD-10-CM | POA: Diagnosis not present

## 2016-04-02 DIAGNOSIS — I10 Essential (primary) hypertension: Secondary | ICD-10-CM | POA: Insufficient documentation

## 2016-04-02 DIAGNOSIS — R599 Enlarged lymph nodes, unspecified: Secondary | ICD-10-CM

## 2016-04-02 DIAGNOSIS — R079 Chest pain, unspecified: Secondary | ICD-10-CM | POA: Diagnosis not present

## 2016-04-02 DIAGNOSIS — Z79899 Other long term (current) drug therapy: Secondary | ICD-10-CM | POA: Diagnosis not present

## 2016-04-02 LAB — URINALYSIS, ROUTINE W REFLEX MICROSCOPIC
BACTERIA UA: NONE SEEN
Bilirubin Urine: NEGATIVE
Glucose, UA: NEGATIVE mg/dL
Ketones, ur: NEGATIVE mg/dL
Leukocytes, UA: NEGATIVE
Nitrite: NEGATIVE
Protein, ur: NEGATIVE mg/dL
SPECIFIC GRAVITY, URINE: 1.02 (ref 1.005–1.030)
pH: 6 (ref 5.0–8.0)

## 2016-04-02 LAB — CBC WITH DIFFERENTIAL/PLATELET
Basophils Absolute: 0 10*3/uL (ref 0.0–0.1)
Basophils Relative: 0 %
Eosinophils Absolute: 0 10*3/uL (ref 0.0–0.7)
Eosinophils Relative: 0 %
HCT: 38.3 % (ref 36.0–46.0)
HEMOGLOBIN: 12.6 g/dL (ref 12.0–15.0)
LYMPHS ABS: 1.3 10*3/uL (ref 0.7–4.0)
LYMPHS PCT: 12 %
MCH: 29.3 pg (ref 26.0–34.0)
MCHC: 32.9 g/dL (ref 30.0–36.0)
MCV: 89.1 fL (ref 78.0–100.0)
Monocytes Absolute: 0.8 10*3/uL (ref 0.1–1.0)
Monocytes Relative: 7 %
NEUTROS ABS: 9 10*3/uL — AB (ref 1.7–7.7)
NEUTROS PCT: 81 %
Platelets: 233 10*3/uL (ref 150–400)
RBC: 4.3 MIL/uL (ref 3.87–5.11)
RDW: 12.9 % (ref 11.5–15.5)
WBC: 11.2 10*3/uL — AB (ref 4.0–10.5)

## 2016-04-02 LAB — I-STAT TROPONIN, ED
TROPONIN I, POC: 0 ng/mL (ref 0.00–0.08)
Troponin i, poc: 0 ng/mL (ref 0.00–0.08)

## 2016-04-02 LAB — BASIC METABOLIC PANEL
ANION GAP: 9 (ref 5–15)
BUN: 13 mg/dL (ref 6–20)
CALCIUM: 9.2 mg/dL (ref 8.9–10.3)
CHLORIDE: 102 mmol/L (ref 101–111)
CO2: 26 mmol/L (ref 22–32)
Creatinine, Ser: 0.64 mg/dL (ref 0.44–1.00)
GFR calc non Af Amer: >60 mL/min (ref >60)
Glucose, Bld: 120 mg/dL — ABNORMAL HIGH (ref 65–99)
Potassium: 4.4 mmol/L (ref 3.5–5.1)
SODIUM: 137 mmol/L (ref 135–145)

## 2016-04-02 LAB — MAGNESIUM: MAGNESIUM: 1.9 mg/dL (ref 1.7–2.4)

## 2016-04-02 MED ORDER — ACETAMINOPHEN 500 MG PO TABS
1000.0000 mg | ORAL_TABLET | Freq: Once | ORAL | Status: AC
  Administered 2016-04-02: 1000 mg via ORAL
  Filled 2016-04-02: qty 2

## 2016-04-02 MED ORDER — SODIUM CHLORIDE 0.9 % IV BOLUS (SEPSIS)
1000.0000 mL | Freq: Once | INTRAVENOUS | Status: AC
  Administered 2016-04-02: 1000 mL via INTRAVENOUS

## 2016-04-02 MED ORDER — AMOXICILLIN 500 MG PO CAPS: 500.0000 mg | ORAL_CAPSULE | Freq: Three times a day (TID) | ORAL | 0 refills | Status: AC

## 2016-04-02 NOTE — Progress Notes (Signed)
Subjective:   Patient ID: [name]    DOB: [birth year], 66 y.o. female   MRN: [id number]  CC: sore lymph nodes, mild temp  HPI: [name] is a 67 y.o. female who presents to clinic today for sore lymph nodes and pain behind her right ear  Patient reports her soreness behind right ear started yesterday.  Around 8 PM she had severe chills, was sitting in front of heater drinking coffee.  Couldn't get herself to warm up.  Is usually cold anyways but has never had chills like that.   She tried to go to bed and lied down when her heart felt fluttery and she had some chest pressure/discomfort. She called EMS around 11:30 pm.  Per EMS she was in bigeminy and trigeminy en route.  Brought to ED and ruled out MI.  Sent home.   This AM she began to have right sided neck pain and behind ear feels very sore.  Scalp on right side hurts and has spread to the central aspect of her forehead. Left side behind ear also beginning to hurt.  Describes it as a burning pain,  7/10 on pain scale.  Denies ear pain,fevers, nausea, vomiting, abd pain, shortness of breath. Denies runny nose, congestion.   Mild temp 100.1 this AM.  Took Voltaren this AM and then went back to bed bc everything was so achy.  Took 2 Tylenol.  Has been eating and drinking normally yesterday but today hasn't felt like eating much.  Has been pushing fluids today as they told her she was a little dehydrated last night in ED.    Of note, her husband recently had shingles, was treated and is now recovering.   ROS: Denies fevers, abdominal pain, shortness of breath, chest pain.  Smoking status reviewed.  Never smoker.  No alcohol or illicit drug use.    Medications reviewed.   Objective:   BP (!) 92/52   Pulse 81   Temp 98.2 F (36.8 C) (Oral)   Ht 4\' 11"  (1.499 m)   Wt 217 lb 12.8 oz (98.8 kg)   SpO2 97%   BMI 43.99 kg/m  Vitals and nursing note reviewed.  General: well nourished, well developed, in no acute distress with  non-toxic appearance HEENT: NCAT, MMM, o/p clear, no rhinorrhea, tympanic membranes clear bilaterally with no erythema or bulging, lymph node tenderness behind R ear, scalp tenderness diffusely over right side, triangular shaped area of warmth and erythema over central forehead with no swelling Neck: supple, normal ROM CV:RRR no MRG Lungs: clear bilaterally with normal work of breathing Abdomen: soft, non-tender, +bs Skin: warm, dry, no rashes or lesions Extremities: warm and well perfused, normal tone  Assessment & Plan:   Consider shingles however pattern of spread and appearance do not suggest it.  No clusters or vesicles noted.  With drainage to lymph nodes and erythema and warmth of skin slowly spreading to central forehead, will treat as cellulitis.  -Patient with allergies to Bactrim and cephalosporins -Amoxicillin 500 mg TID prescribed x 5 days  -Tylenol as needed for fevers -Strict return precautions discussed  Meds ordered this encounter  Medications  . amoxicillin (AMOXIL) 500 MG capsule    Sig: Take 1 capsule (500 mg total) by mouth 3 (three) times daily.    Dispense:  15 capsule    Refill:  0   Follow up: as needed or if symptoms worsen   Lovenia Kim, MD [place], PGY-1 04/03/2016 11:25  PM

## 2016-04-02 NOTE — ED Provider Notes (Signed)
[facility] DEPT Provider Note   CSN: [id number] Arrival date & time: 04/02/16  0012  By signing my name below, I, Dolores Hoose, attest that this documentation has been prepared under the direction and in the presence of Everlene Balls, MD . Electronically Signed: Dolores Hoose, Scribe. 04/02/2016. 12:16 AM.  History   Chief Complaint Chief Complaint  Patient presents with  . Chest Pain   The history is provided by the patient. No language interpreter was used.    HPI Comments:  [name] is a 66 y.o. female with pmhx of HTN, brought in by ambulance, who presents to the Emergency Department complaining of intermittent, mild chest pain beginning about a week ago. EMS report that the pt has been in and out of bigeminy and trigeminy en route. She is a retired Chief Strategy Officer and states that her bigeminy and trigeminy is normal for her. Pt describes her pain as a aching pain that is 6/10 while she is in an irregular rhythm and a 4/10 when she is not. Her pain radiates into her back and left side of her neck. She reports associated SOB, lightheadedness, nausea and chills. Pt has taken 650 ASA and was given 1 nitroglycerine PTA with minimal relief. She denies any diaphoresis, cough or fever. Pt also denies any recent diet or medication changes.   Past Medical History:  Diagnosis Date  . Anemia   . Anxiety   . Arthritis   . Encounter for long-term (current) use of other medications   . GERD (gastroesophageal reflux disease)   . Hypertension   . IBS (irritable bowel syndrome)   . Obesity   . Osteoarthritis   . Pain in joint, multiple sites   . Psoriatic arthropathy ([place])   . Raynaud disease     Patient Active Problem List   Diagnosis Date Noted  . Vitamin D deficiency 12/05/2015  . Vitamin B 12 deficiency 12/05/2015  . H/O TB skin testing 12/05/2015  . AC (acromioclavicular) joint arthritis 08/29/2015  . Otitis media, acute suppurative 11/25/2014  . Well adult health check  09/01/2013  . Hammertoe 11/20/2012  . Psoriatic arthritis ([place]) 04/10/2012  . Pulmonary nodule seen on imaging study 11/08/2011  . Dysthymia 12/12/2010  . Pain in joint, shoulder region 10/03/2008  . Essential hypertension, benign 04/27/2008  . Palpitations 10/30/2007  . METATARSALGIA 10/02/2006  . KNEE REPLACEMENT, LEFT, HX OF 07/03/2006  . OBESITY, NOS 04/17/2006  . PANIC ATTACKS 04/17/2006  . GASTROESOPHAGEAL REFLUX, NO ESOPHAGITIS 04/17/2006  . CHOLELITHIASIS, NOS 04/17/2006  . VAGINITIS, ATROPHIC 04/17/2006  . ARTHRALGIA, UNSPECIFIED 04/17/2006    Past Surgical History:  Procedure Laterality Date  . ABDOMINAL HYSTERECTOMY    . CHOLECYSTECTOMY    . GASTRIC BYPASS    . HALLUX VALGUS CORRECTION  09/2010   Dr Arnetha Courser; right 1st ray  . HERNIA REPAIR    . JOINT REPLACEMENT    . TUBAL LIGATION      OB History    No data available       Home Medications    Prior to Admission medications   Medication Sig Start Date End Date Taking? Authorizing Provider  albuterol (PROVENTIL HFA;VENTOLIN HFA) 108 (90 Base) MCG/ACT inhaler Inhale 2 puffs into the lungs every 6 (six) hours as needed for wheezing or shortness of breath. 01/06/16   Mary-Margaret Hassell Done, FNP  ALPRAZolam Duanne Moron) 0.5 MG tablet Take 1 tablet (0.5 mg total) by mouth 3 (three) times daily as needed. 03/05/16   [name], MD  benazepril (LOTENSIN) 20 MG tablet Take 1 tablet (20 mg total) by mouth daily. 02/16/16   [name], MD  benzonatate (TESSALON PERLES) 100 MG capsule Take 1 capsule (100 mg total) by mouth 3 (three) times daily as needed for cough. 01/06/16   Mary-Margaret Hassell Done, FNP  Clotrimazole 1 % OINT Apply to rash area twice a day as needed 02/16/16   [name], MD  diclofenac (VOLTAREN) 75 MG EC tablet TAKE 1 TABLET BY MOUTH 2 TIMES DAILY AS NEEDED FOR JOINT PAIN 02/16/16   [name], MD  esomeprazole (NEXIUM) 20 MG capsule Take 1 capsule (20 mg total) by mouth daily. 02/16/16   [name], MD    fluconazole (DIFLUCAN) 100 MG tablet TAKE 1 TABLET BY MOUTH EVERY OTHER DAYS FOR 7 DOSES, THEN TAKE 1 TABLET ONCE WEEKLY THEREAFTER 01/26/16   [name], MD  fluticasone [facility]) 50 MCG/ACT nasal spray Place 1-2 sprays into both nostrils daily. 02/16/16   [name], MD  furosemide (LASIX) 20 MG tablet TAKE 1 OR 2 TABLETS BY MOUTH DAILY FOR LOWER EXTREMITY SWELLING 02/16/16   [name], MD  Miconazole-Zinc Oxide-Petrolat 0.25-15-81.35 % OINT Apply to affected area bid 02/16/16   [name], MD  mupirocin ointment Drue Stager) 2 %  06/26/15   Historical Provider, MD  oseltamivir (TAMIFLU) 75 MG capsule Take 1 capsule (75 mg total) by mouth daily. For 10 days for prevention dose. If develop flu symptoms, take twice daily for 5 days 05/06/15   Olin Hauser, DO  pyrithione zinc (HEAD AND SHOULDERS) 1 % shampoo Shampoo hair and rinse off daily as directed 02/27/16   [name], MD  Terbinafine 1 % GEL Apply to feet twice a day 05/19/11   [name], MD  valACYclovir (VALTREX) 500 MG tablet TAKE 1 TABLET BY MOUTH TWO TIMES DAILY FOR 3 DAYS AT [facility] OUTBREAK 02/16/16   [name], MD  venlafaxine XR (EFFEXOR-XR) 150 MG 24 hr capsule Take 1 capsule (150 mg total) by mouth daily. 02/16/16   [name], MD  venlafaxine XR (EFFEXOR-XR) 75 MG 24 hr capsule TAKE 1 CAPSULE BY MOUTH ONCE DAILY (IN ADDITION TO 150MG  CAPSULE FOR MAX OF 225MG  PER DAY) 03/05/16   [name], MD    Family History Family History  Problem Relation Age of Onset  . Depression Mother   . Deep vein thrombosis Father     Social History Social History  Substance Use Topics  . Smoking status: Never Smoker  . Smokeless tobacco: Never Used  . Alcohol use No     Allergies   Bactrim [sulfamethoxazole-trimethoprim]; Banana; Ciprofloxacin; Coconut flavor; Codeine; Vioxx [rofecoxib]; and Zithromax [azithromycin]   Review of Systems Review of Systems 10 Systems reviewed and are negative for acute change except as noted  in the HPI.   Physical Exam Updated Vital Signs Ht 4\' 11"  (1.499 m)   Wt 211 lb (95.7 kg)   SpO2 98%   BMI 42.62 kg/m   Physical Exam  Constitutional: She is oriented to person, place, and time. She appears well-developed and well-nourished. No distress.  HENT:  Head: Normocephalic and atraumatic.  Nose: Nose normal.  Mouth/Throat: Oropharynx is clear and moist. No oropharyngeal exudate.  Eyes: Conjunctivae and EOM are normal. Pupils are equal, round, and reactive to light. No scleral icterus.  Neck: Normal range of motion. Neck supple. No JVD present. No tracheal deviation present. No thyromegaly present.  Cardiovascular:  Regular rhythm and normal heart sounds.  Tachycardia present.  Exam reveals no gallop and no friction rub.   No murmur heard. Pulmonary/Chest: Effort normal and breath sounds normal. No respiratory distress. She has no wheezes. She exhibits no tenderness.  Abdominal: Soft. Bowel sounds are normal. She exhibits no distension and no mass. There is no tenderness. There is no rebound and no guarding.  Musculoskeletal: Normal range of motion. She exhibits no edema or tenderness.  Lymphadenopathy:    She has no cervical adenopathy.  Neurological: She is alert and oriented to person, place, and time. No cranial nerve deficit. She exhibits normal muscle tone.  Skin: Skin is warm and dry. No rash noted. No erythema. No pallor.  Nursing note and vitals reviewed.    ED Treatments / Results  DIAGNOSTIC STUDIES:  Oxygen Saturation is 98% on RA, normal by my interpretation.    COORDINATION OF CARE:  12:23 AM Discussed treatment plan with pt at bedside which includes repeat troponin and pt agreed to plan.  Labs (all labs ordered are listed, but only abnormal results are displayed) Labs Reviewed  CBC WITH DIFFERENTIAL/PLATELET  [place], ED    EKG  EKG Interpretation None       Radiology No results  found.  Procedures Procedures (including critical care time)  Medications Ordered in ED Medications - No data to display   Initial Impression / Assessment and Plan / ED Course  I have reviewed the triage vital signs and the nursing notes.  Pertinent labs & imaging results that were available during my care of the patient were reviewed by me and considered in my medical decision making (see chart for details).   Patient presents to the ED for chest pain for several days.  She also had chills and rigors.  This is concerning for possible pneumonia, but she denies fever or cough.  Her history is low risk for ACS. Will obtain CXR, troponin for further eval.  Temp is 100.0 which is very close to a fever and further explains her rigors.  Will continue to closely monitor.   2:12 AM CXR and CT neg for pneumonia.  Will hold for delta troponin and EKG at 330am.  Patient continues to be stable.  4:05 AM Repeat troponin and EKG are unchanged.  Likely a viral illness.  Advised to follow up with PCP tomorrow and she states that she will.  VS are normal, tachycardia has resolved.  Repeat temp is 99, taken by myself.  She appears well and in NAD.  Ambulating the hallway without any assistance.  Patient is safe for DC.      Final Clinical Impressions(s) / ED Diagnoses   Final diagnoses:  None    New Prescriptions New Prescriptions   No medications on file     I personally performed the services described in this documentation, which was scribed in my presence. The recorded information has been reviewed and is accurate.       Everlene Balls, MD 04/02/16 [phone]

## 2016-04-02 NOTE — ED Triage Notes (Signed)
Pt arrives via EMS for c/o chest pain, worsening with palpitations. She took 650mg  ASA and had 1 nitro en route. Pain is described as a pressure/ache that radiates to her back.

## 2016-04-02 NOTE — Patient Instructions (Signed)
It was nice meeting you today! You were seen in clinic for swollen lymph nodes and redness spreading to your scalp and forehead.  We are going to go ahead and treat it as cellulitis.  I have prescribed you Amoxicillin to be taken three times a day for the next 5 days.  Please come in to be seen if you notice the redness continue to spread of if you have high fevers.  Also if you begin to develop blisters (Shingles from your husband) please make an appointment to come in right away.  Stop using the [facility] for now as we discussed that we are unsure what medications or underlying causes may be responsible for your symptoms.

## 2016-04-03 LAB — URINE CULTURE

## 2016-05-20 ENCOUNTER — Encounter: Payer: Self-pay | Admitting: Family Medicine

## 2016-05-20 NOTE — Progress Notes (Signed)
Request from [facility] for preop clearance left shoulder surgery. Chart reviewed. No hx CAD, normal airway, no clotting disorders or anemia. Clearance letter signed and faxed back Dorcas Mcmurray

## 2016-05-31 ENCOUNTER — Other Ambulatory Visit: Payer: Self-pay | Admitting: Orthopedic Surgery

## 2016-06-05 ENCOUNTER — Encounter: Payer: Self-pay | Admitting: Family Medicine

## 2016-06-05 ENCOUNTER — Ambulatory Visit (INDEPENDENT_AMBULATORY_CARE_PROVIDER_SITE_OTHER): Payer: BLUE CROSS/BLUE SHIELD | Admitting: Family Medicine

## 2016-06-05 VITALS — BP 116/76 | HR 79 | Temp 98.0°F | Ht 59.0 in | Wt 218.0 lb

## 2016-06-05 DIAGNOSIS — M79675 Pain in left toe(s): Secondary | ICD-10-CM

## 2016-06-05 DIAGNOSIS — M79674 Pain in right toe(s): Secondary | ICD-10-CM | POA: Diagnosis not present

## 2016-06-06 NOTE — Progress Notes (Signed)
    CHIEF COMPLAINT / HPI:   Bilateral great toe pain. Several years ago she had pulse of her great toe toenails removed by podiatrists in town. In the last year or so she's had regrowth of a small portion of nail on both toes which is causing her some discomfort when wearing shoes. She was made to remove the additional portion area she also has questions about just removing all of her nails as she said that would be "simpler".  REVIEW OF SYSTEMS:  Mild pain at the site of the toenail regrowth. She's had no bleeding. She does pick at this area and occasionally try to remove portions of the nail.  OBJECTIVE:  Vital signs are reviewed.   GEN.: Well-developed female no acute distress FEET: Bilateral great toes have well-healed previous nailbed area on the great toes. At the proximal portion of the previous nailbed on each toe there is a small 2-3 mm strip of nail regrowth.  ASSESSMENT / PLAN: Partial regrowth of previously removed great toenails. I will think this is within the scope of what I am comfortable doing a procedure on so I will have her go back and see her podiatrist.

## 2016-06-06 NOTE — H&P (Signed)
[name] is an 66 y.o. female.    Chief Complaint: right shoulder pain  HPI: Pt is a 66 y.o. female complaining of right shoulder pain for multiple years. Pain had continually increased since the beginning. X-rays in the clinic show end-stage arthritic changes of the right shoulder. Pt has tried various conservative treatments which have failed to alleviate their symptoms, including injections and therapy. Various options are discussed with the patient. Risks, benefits and expectations were discussed with the patient. Patient understand the risks, benefits and expectations and wishes to proceed with surgery.   PCP:  Dorcas Mcmurray, MD  D/C Plans: Home  PMH: Past Medical History:  Diagnosis Date  . Anemia   . Anxiety   . Arthritis   . Encounter for long-term (current) use of other medications   . GERD (gastroesophageal reflux disease)   . Hypertension   . IBS (irritable bowel syndrome)   . Obesity   . Osteoarthritis   . Pain in joint, multiple sites   . Psoriatic arthropathy ([place])   . Raynaud disease     PSH: Past Surgical History:  Procedure Laterality Date  . ABDOMINAL HYSTERECTOMY    . CHOLECYSTECTOMY    . GASTRIC BYPASS    . HALLUX VALGUS CORRECTION  09/2010   Dr Arnetha Courser; right 1st ray  . HERNIA REPAIR    . JOINT REPLACEMENT    . TUBAL LIGATION      Social History:  reports that she has never smoked. She has never used smokeless tobacco. She reports that she does not drink alcohol or use drugs.  Allergies:  Allergies  Allergen Reactions  . Bactrim [Sulfamethoxazole-Trimethoprim] Diarrhea and Nausea And Vomiting  . Banana   . Cephalosporins Diarrhea and Nausea And Vomiting  . Ciprofloxacin Diarrhea and Nausea And Vomiting  . Coconut Flavor   . Codeine     nausea  . Vioxx [Rofecoxib]   . Zithromax [Azithromycin] Diarrhea and Nausea And Vomiting    Medications: No current facility-administered medications for this encounter.    Current Outpatient  Prescriptions  Medication Sig Dispense Refill  . ALPRAZolam (XANAX) 0.5 MG tablet Take 1 tablet (0.5 mg total) by mouth 3 (three) times daily as needed. 90 tablet 5  . benazepril (LOTENSIN) 20 MG tablet Take 1 tablet (20 mg total) by mouth daily. 90 tablet 3  . diclofenac (VOLTAREN) 75 MG EC tablet TAKE 1 TABLET BY MOUTH 2 TIMES DAILY AS NEEDED FOR JOINT PAIN 180 tablet 3  . esomeprazole (NEXIUM) 20 MG capsule Take 1 capsule (20 mg total) by mouth daily. 90 capsule 3  . fluconazole (DIFLUCAN) 100 MG tablet TAKE 1 TABLET BY MOUTH EVERY OTHER DAYS FOR 7 DOSES, THEN TAKE 1 TABLET ONCE WEEKLY THEREAFTER (Patient not taking: Reported on 04/02/2016) 21 tablet 3  . fluconazole (DIFLUCAN) 100 MG tablet Take 100 mg by mouth every 7 (seven) days.    . furosemide (LASIX) 20 MG tablet TAKE 1 OR 2 TABLETS BY MOUTH DAILY FOR LOWER EXTREMITY SWELLING 180 tablet 3  . venlafaxine XR (EFFEXOR-XR) 150 MG 24 hr capsule Take 1 capsule (150 mg total) by mouth daily. (Patient taking differently: Take 150 mg by mouth daily. Take with venlafaxine xr 75 to equal 225 mg) 90 capsule 3  . venlafaxine XR (EFFEXOR-XR) 75 MG 24 hr capsule TAKE 1 CAPSULE BY MOUTH ONCE DAILY (IN ADDITION TO 150MG  CAPSULE FOR MAX OF 225MG  PER DAY) (Patient taking differently: Take 75 mg by mouth daily with breakfast. Take with  Venlafaxine 150MG  to equal 225 mg) 90 capsule 3    No results found for this or any previous visit (from the past 48 hour(s)). No results found.  ROS: Pain with rom of the right upper extremity  Physical Exam:  Alert and oriented 66 y.o. female in no acute distress Cranial nerves 2-12 intact Cervical spine: full rom with no tenderness, nv intact distally Chest: active breath sounds bilaterally, no wheeze rhonchi or rales Heart: regular rate and rhythm, no murmur Abd: non tender non distended with active bowel sounds Hip is stable with rom  Right shoulder with painful rom Strength is limited 3.5/5 with ER as compared  with left  nv intact distally No rashes or edema  Assessment/Plan Assessment: right shoulder end stage osteoarthritis with cuff tear  Plan: Patient will undergo a right total shoulder arthroplasty with cuff repair by Dr. Veverly Fells at [facility]. Risks benefits and expectations were discussed with the patient. Patient understand risks, benefits and expectations and wishes to proceed.

## 2016-06-20 ENCOUNTER — Encounter (HOSPITAL_COMMUNITY): Payer: Self-pay

## 2016-06-20 ENCOUNTER — Encounter (HOSPITAL_COMMUNITY)
Admission: RE | Admit: 2016-06-20 | Discharge: 2016-06-20 | Disposition: A | Discharge status: Home / Self Care | Payer: BLUE CROSS/BLUE SHIELD | Source: Ambulatory Visit | Attending: Orthopedic Surgery | Admitting: Orthopedic Surgery

## 2016-06-20 DIAGNOSIS — M25511 Pain in right shoulder: Secondary | ICD-10-CM | POA: Diagnosis not present

## 2016-06-20 DIAGNOSIS — M19011 Primary osteoarthritis, right shoulder: Secondary | ICD-10-CM | POA: Insufficient documentation

## 2016-06-20 DIAGNOSIS — Z01812 Encounter for preprocedural laboratory examination: Secondary | ICD-10-CM | POA: Diagnosis not present

## 2016-06-20 HISTORY — DX: Inflammatory liver disease, unspecified: K75.9

## 2016-06-20 HISTORY — DX: Depression, unspecified: F32.A

## 2016-06-20 HISTORY — DX: Nausea with vomiting, unspecified: R11.2

## 2016-06-20 HISTORY — DX: Other specified postprocedural states: Z98.890

## 2016-06-20 HISTORY — DX: Pneumonia, unspecified organism: J18.9

## 2016-06-20 HISTORY — DX: Major depressive disorder, single episode, unspecified: F32.9

## 2016-06-20 HISTORY — DX: Personal history of other diseases of the digestive system: Z87.19

## 2016-06-20 HISTORY — DX: Cardiac arrhythmia, unspecified: I49.9

## 2016-06-20 LAB — COMPREHENSIVE METABOLIC PANEL
ALBUMIN: 3.5 g/dL (ref 3.5–5.0)
ALK PHOS: 71 U/L (ref 38–126)
ALT: 31 U/L (ref 14–54)
ANION GAP: 7 (ref 5–15)
AST: 34 U/L (ref 15–41)
BILIRUBIN TOTAL: 0.4 mg/dL (ref 0.3–1.2)
BUN: 12 mg/dL (ref 6–20)
CALCIUM: 8.9 mg/dL (ref 8.9–10.3)
CO2: 25 mmol/L (ref 22–32)
Chloride: 106 mmol/L (ref 101–111)
Creatinine, Ser: 0.6 mg/dL (ref 0.44–1.00)
GFR calc Af Amer: >60 mL/min (ref >60)
GFR calc non Af Amer: >60 mL/min (ref >60)
GLUCOSE: 104 mg/dL — AB (ref 65–99)
Potassium: 4.6 mmol/L (ref 3.5–5.1)
SODIUM: 138 mmol/L (ref 135–145)
Total Protein: 6.1 g/dL — ABNORMAL LOW (ref 6.5–8.1)

## 2016-06-20 LAB — CBC
HCT: 38.5 % (ref 36.0–46.0)
HEMOGLOBIN: 12.3 g/dL (ref 12.0–15.0)
MCH: 28.5 pg (ref 26.0–34.0)
MCHC: 31.9 g/dL (ref 30.0–36.0)
MCV: 89.1 fL (ref 78.0–100.0)
Platelets: 270 10*3/uL (ref 150–400)
RBC: 4.32 MIL/uL (ref 3.87–5.11)
RDW: 13.1 % (ref 11.5–15.5)
WBC: 8.6 10*3/uL (ref 4.0–10.5)

## 2016-06-20 LAB — SURGICAL PCR SCREEN
MRSA, PCR: NEGATIVE
Staphylococcus aureus: POSITIVE — AB

## 2016-06-20 NOTE — Progress Notes (Signed)
Called Ms. Scogin and informed of PCR results and the need to use Mupriocin- states she has some at home instructed her to start using this today, voices understanding.

## 2016-06-20 NOTE — Progress Notes (Signed)
PCP is Dr. Dorcas Mcmurray States she saw Dr Pernell Dupre many years ago. States she had a stress test 7-8 years ago. Denies any chest pain, fever, or cough.  Denies ever having a card cath, or echo.

## 2016-06-20 NOTE — Pre-Procedure Instructions (Signed)
[name]  06/20/2016      [place], [place] - [facility] [address] [place] [address] Phone: [phone] Fax: [phone]  [facility] [place], [facility] [place] [address] Phone: [phone] Fax: [phone]    Your procedure is scheduled on Friday, May 11.  Report to [facility] Admitting at 5:30 AM                Your surgery or procedure is scheduled for 7:30 AM   Call this number if you have problems the morning of surgery: [phone]                 For any other questions, please call [phone], Monday - Friday 8 AM - 4 PM.   Remember:  Do not eat food or drink liquids after midnight Thursday, May 10.  Take these medicines the morning of surgery with A SIP OF WATER : esomeprazole (NEXIUM), cetirizine (ZYRTEC).  May use Flonase.                Take if need: acetaminophen (TYLENOL), ALPRAZolam (XANAX), valACYclovir (VALTREX).                  1 Week prior to surgery STOP taking Aspirin , Aspirin Products (Goody Powder, Excedrin Migraine), Ibuprofen (Advil), Naproxen (Aleve), Vitamins and Herbal Products (ie Fish Oil) Special Instructions:  [facility]- Preparing For Surgery  Before surgery, you can play an important role. Because skin is not sterile, your skin needs to be as free of germs as possible. You can reduce the number of germs on your skin by washing with CHG (chlorahexidine gluconate) Soap before surgery.  CHG is an antiseptic cleaner which kills germs and bonds with the skin to continue killing germs even after washing.  Please do not use if you have an allergy to CHG or antibacterial soaps. If your skin becomes reddened/irritated stop using the CHG.  Do not shave (including legs and underarms) for at least 48 hours prior to first CHG shower. It is OK to shave your face.  Please follow these  instructions carefully.   1. Shower the NIGHT BEFORE SURGERY and the MORNING OF SURGERY with CHG.   2. If you chose to wash your hair, wash your hair first as usual with your normal shampoo.  3. After you shampoo, rinse your hair and body thoroughly to remove the shampoo.  4. Use CHG as you would any other liquid soap. You can apply CHG directly to the skin and wash gently with a scrungie or a clean washcloth.   5. Apply the CHG Soap to your body ONLY FROM THE NECK DOWN.  Do not use on open wounds or open sores. Avoid contact with your eyes, ears, mouth and genitals (private parts). Wash genitals (private parts) with your normal soap.  6. Wash thoroughly, paying special attention to the area where your surgery will be performed.  7. Thoroughly rinse your body with warm water from the neck down.  8. DO NOT shower/wash with your normal soap after using and rinsing off the CHG Soap.  9. Pat yourself dry with a CLEAN TOWEL.   10. Wear CLEAN PAJAMAS   11. Place CLEAN SHEETS on your bed the night of your first shower and DO NOT SLEEP WITH PETS.  Day of Surgery:  Do not apply any deodorants/lotions. Please wear clean clothes to the hospital/surgery center.    Do not wear jewelry, make-up or nail polish.   Do not wear lotions, powders, or perfumes, or deoderant.  Do not shave 48 hours prior to surgery.  Men may shave face and neck.  Do not bring valuables to the hospital.  [facility] is not responsible for any belongings or valuables.  Contacts, dentures or bridgework may not be worn into surgery.  Leave your suitcase in the car.  After surgery it may be brought to your room.  For patients admitted to the hospital, discharge time will be determined by your treatment team.  Patients discharged the day of surgery will not be allowed to drive home.   Name and phone number of your driver:   -  Please read over the following fact sheets that you were given:  Patient Instructions for  Mupirocin Application, Incentive Spirometry, Pain Booklet, Surgical Site Infection

## 2016-06-27 ENCOUNTER — Encounter (HOSPITAL_COMMUNITY): Payer: Self-pay | Admitting: Anesthesiology

## 2016-06-27 MED ORDER — CLINDAMYCIN PHOSPHATE 900 MG/50ML IV SOLN
900.0000 mg | INTRAVENOUS | Status: AC
  Administered 2016-06-28: 900 mg via INTRAVENOUS
  Filled 2016-06-27: qty 50

## 2016-06-27 NOTE — Anesthesia Preprocedure Evaluation (Addendum)
Anesthesia Evaluation  Patient identified by MRN, date of birth, ID band Patient awake    Reviewed: Allergy & Precautions, NPO status , Patient's Chart, lab work & pertinent test results  History of Anesthesia Complications (+) PONV and history of anesthetic complications  Airway Mallampati: III       Dental no notable dental hx.    Pulmonary    Pulmonary exam normal breath sounds clear to auscultation       Cardiovascular hypertension, Pt. on medications Normal cardiovascular exam Rhythm:Regular Rate:Normal     Neuro/Psych PSYCHIATRIC DISORDERS Anxiety Depression    GI/Hepatic GERD  Medicated and Controlled,  Endo/Other  Morbid obesity  Renal/GU      Musculoskeletal   Abdominal (+) + obese,   Peds  Hematology   Anesthesia Other Findings   Reproductive/Obstetrics                           Anesthesia Physical Anesthesia Plan  ASA: III  Anesthesia Plan: General   Post-op Pain Management: GA combined w/ Regional for post-op pain   Induction: Intravenous  Airway Management Planned: Oral ETT  Additional Equipment:   Intra-op Plan:   Post-operative Plan: Extubation in OR  Informed Consent:   Plan Discussed with: CRNA and Surgeon  Anesthesia Plan Comments:        Anesthesia Quick Evaluation

## 2016-06-28 ENCOUNTER — Ambulatory Visit (HOSPITAL_COMMUNITY): Payer: BLUE CROSS/BLUE SHIELD | Admitting: Anesthesiology

## 2016-06-28 ENCOUNTER — Inpatient Hospital Stay (HOSPITAL_COMMUNITY)
Admission: AD | Admit: 2016-06-28 | Discharge: 2016-06-29 | DRG: 483 | Disposition: A | Discharge status: Home / Self Care | Payer: BLUE CROSS/BLUE SHIELD | Source: Ambulatory Visit | Attending: Orthopedic Surgery | Admitting: Orthopedic Surgery

## 2016-06-28 ENCOUNTER — Encounter (HOSPITAL_COMMUNITY)
Admission: AD | Disposition: A | Discharge status: Home / Self Care | Payer: Self-pay | Source: Ambulatory Visit | Attending: Orthopedic Surgery

## 2016-06-28 ENCOUNTER — Inpatient Hospital Stay (HOSPITAL_COMMUNITY): Payer: BLUE CROSS/BLUE SHIELD

## 2016-06-28 DIAGNOSIS — I7300 Raynaud's syndrome without gangrene: Secondary | ICD-10-CM | POA: Diagnosis present

## 2016-06-28 DIAGNOSIS — M19011 Primary osteoarthritis, right shoulder: Principal | ICD-10-CM | POA: Diagnosis present

## 2016-06-28 DIAGNOSIS — M25711 Osteophyte, right shoulder: Secondary | ICD-10-CM | POA: Diagnosis present

## 2016-06-28 DIAGNOSIS — I10 Essential (primary) hypertension: Secondary | ICD-10-CM | POA: Diagnosis present

## 2016-06-28 DIAGNOSIS — K589 Irritable bowel syndrome without diarrhea: Secondary | ICD-10-CM | POA: Diagnosis present

## 2016-06-28 DIAGNOSIS — E669 Obesity, unspecified: Secondary | ICD-10-CM | POA: Diagnosis present

## 2016-06-28 DIAGNOSIS — Z9049 Acquired absence of other specified parts of digestive tract: Secondary | ICD-10-CM

## 2016-06-28 DIAGNOSIS — Z885 Allergy status to narcotic agent status: Secondary | ICD-10-CM

## 2016-06-28 DIAGNOSIS — Z79899 Other long term (current) drug therapy: Secondary | ICD-10-CM | POA: Diagnosis not present

## 2016-06-28 DIAGNOSIS — Z9884 Bariatric surgery status: Secondary | ICD-10-CM | POA: Diagnosis not present

## 2016-06-28 DIAGNOSIS — Z90710 Acquired absence of both cervix and uterus: Secondary | ICD-10-CM

## 2016-06-28 DIAGNOSIS — K219 Gastro-esophageal reflux disease without esophagitis: Secondary | ICD-10-CM | POA: Diagnosis present

## 2016-06-28 DIAGNOSIS — Z91018 Allergy to other foods: Secondary | ICD-10-CM

## 2016-06-28 DIAGNOSIS — M25519 Pain in unspecified shoulder: Secondary | ICD-10-CM | POA: Diagnosis present

## 2016-06-28 DIAGNOSIS — Z881 Allergy status to other antibiotic agents status: Secondary | ICD-10-CM

## 2016-06-28 DIAGNOSIS — Z888 Allergy status to other drugs, medicaments and biological substances status: Secondary | ICD-10-CM | POA: Diagnosis not present

## 2016-06-28 DIAGNOSIS — Z96611 Presence of right artificial shoulder joint: Secondary | ICD-10-CM

## 2016-06-28 DIAGNOSIS — L4050 Arthropathic psoriasis, unspecified: Secondary | ICD-10-CM | POA: Diagnosis present

## 2016-06-28 DIAGNOSIS — Z883 Allergy status to other anti-infective agents status: Secondary | ICD-10-CM | POA: Diagnosis not present

## 2016-06-28 DIAGNOSIS — M25511 Pain in right shoulder: Secondary | ICD-10-CM | POA: Diagnosis present

## 2016-06-28 DIAGNOSIS — F419 Anxiety disorder, unspecified: Secondary | ICD-10-CM | POA: Diagnosis present

## 2016-06-28 HISTORY — PX: SHOULDER HEMI-ARTHROPLASTY: SHX5049

## 2016-06-28 SURGERY — HEMIARTHROPLASTY, SHOULDER
Anesthesia: General | Site: Shoulder | Laterality: Right

## 2016-06-28 MED ORDER — EPINEPHRINE PF 1 MG/ML IJ SOLN
INTRAMUSCULAR | Status: AC
  Filled 2016-06-28: qty 1

## 2016-06-28 MED ORDER — VALACYCLOVIR HCL 500 MG PO TABS: 1000.0000 mg | ORAL_TABLET | Freq: Every day | ORAL | Status: DC | PRN

## 2016-06-28 MED ORDER — DIPHENHYDRAMINE HCL 50 MG/ML IJ SOLN
INTRAMUSCULAR | Status: DC | PRN
  Administered 2016-06-28: 12.5 mg via INTRAVENOUS

## 2016-06-28 MED ORDER — IBUPROFEN 200 MG PO TABS
400.0000 mg | ORAL_TABLET | Freq: Four times a day (QID) | ORAL | Status: DC | PRN
  Administered 2016-06-28 – 2016-06-29 (×2): 400 mg via ORAL
  Filled 2016-06-28 (×3): qty 2

## 2016-06-28 MED ORDER — ALBUTEROL SULFATE (2.5 MG/3ML) 0.083% IN NEBU: 2.5000 mg | INHALATION_SOLUTION | Freq: Once | RESPIRATORY_TRACT | Status: DC

## 2016-06-28 MED ORDER — METHOCARBAMOL 1000 MG/10ML IJ SOLN
500.0000 mg | Freq: Four times a day (QID) | INTRAVENOUS | Status: DC | PRN
  Filled 2016-06-28: qty 5

## 2016-06-28 MED ORDER — CHLORHEXIDINE GLUCONATE 4 % EX LIQD: 60.0000 mL | Freq: Once | CUTANEOUS | Status: DC

## 2016-06-28 MED ORDER — ACETAMINOPHEN 10 MG/ML IV SOLN: 1000.0000 mg | Freq: Once | INTRAVENOUS | Status: DC | PRN

## 2016-06-28 MED ORDER — DEXAMETHASONE SODIUM PHOSPHATE 10 MG/ML IJ SOLN
INTRAMUSCULAR | Status: AC
  Filled 2016-06-28: qty 1

## 2016-06-28 MED ORDER — 0.9 % SODIUM CHLORIDE (POUR BTL) OPTIME
TOPICAL | Status: DC | PRN
  Administered 2016-06-28: 1000 mL

## 2016-06-28 MED ORDER — PHENOL 1.4 % MT LIQD: 1.0000 | OROMUCOSAL | Status: DC | PRN

## 2016-06-28 MED ORDER — ROCURONIUM BROMIDE 10 MG/ML (PF) SYRINGE
PREFILLED_SYRINGE | INTRAVENOUS | Status: AC
  Filled 2016-06-28: qty 5

## 2016-06-28 MED ORDER — SUCCINYLCHOLINE 20MG/ML (10ML) SYRINGE FOR MEDFUSION PUMP - OPTIME
INTRAMUSCULAR | Status: DC | PRN
  Administered 2016-06-28: 100 mg via INTRAVENOUS

## 2016-06-28 MED ORDER — FENTANYL CITRATE (PF) 250 MCG/5ML IJ SOLN
INTRAMUSCULAR | Status: AC
  Filled 2016-06-28: qty 5

## 2016-06-28 MED ORDER — PHENYLEPHRINE HCL 10 MG/ML IJ SOLN
INTRAVENOUS | Status: DC | PRN
  Administered 2016-06-28: 25 ug/min via INTRAVENOUS

## 2016-06-28 MED ORDER — LACTATED RINGERS IV SOLN
INTRAVENOUS | Status: DC | PRN
  Administered 2016-06-28: 07:00:00 via INTRAVENOUS

## 2016-06-28 MED ORDER — MIDAZOLAM HCL 2 MG/2ML IJ SOLN
INTRAMUSCULAR | Status: AC
  Filled 2016-06-28: qty 2

## 2016-06-28 MED ORDER — POLYETHYLENE GLYCOL 3350 17 G PO PACK: 17.0000 g | PACK | Freq: Every day | ORAL | Status: DC | PRN

## 2016-06-28 MED ORDER — FUROSEMIDE 20 MG PO TABS: 20.0000 mg | ORAL_TABLET | Freq: Every day | ORAL | Status: DC

## 2016-06-28 MED ORDER — ACETAMINOPHEN 650 MG RE SUPP: 650.0000 mg | Freq: Four times a day (QID) | RECTAL | Status: DC | PRN

## 2016-06-28 MED ORDER — MEPERIDINE HCL 25 MG/ML IJ SOLN: 6.2500 mg | INTRAMUSCULAR | Status: DC | PRN

## 2016-06-28 MED ORDER — THROMBIN 5000 UNITS EX SOLR
CUTANEOUS | Status: AC
  Filled 2016-06-28: qty 5000

## 2016-06-28 MED ORDER — HYDROMORPHONE HCL 1 MG/ML IJ SOLN: 0.2500 mg | INTRAMUSCULAR | Status: DC | PRN

## 2016-06-28 MED ORDER — ACETAMINOPHEN 325 MG PO TABS: 650.0000 mg | ORAL_TABLET | Freq: Four times a day (QID) | ORAL | Status: DC | PRN

## 2016-06-28 MED ORDER — PHENYLEPHRINE 40 MCG/ML (10ML) SYRINGE FOR IV PUSH (FOR BLOOD PRESSURE SUPPORT)
PREFILLED_SYRINGE | INTRAVENOUS | Status: AC
  Filled 2016-06-28: qty 10

## 2016-06-28 MED ORDER — MENTHOL 3 MG MT LOZG: 1.0000 | LOZENGE | OROMUCOSAL | Status: DC | PRN

## 2016-06-28 MED ORDER — THROMBIN 5000 UNITS EX SOLR
CUTANEOUS | Status: DC | PRN
  Administered 2016-06-28: 5000 [IU] via TOPICAL

## 2016-06-28 MED ORDER — VENLAFAXINE HCL ER 75 MG PO CP24
75.0000 mg | ORAL_CAPSULE | Freq: Every day | ORAL | Status: DC
  Administered 2016-06-28: 75 mg via ORAL
  Filled 2016-06-28: qty 1

## 2016-06-28 MED ORDER — SODIUM CHLORIDE 0.9 % IV SOLN: INTRAVENOUS | Status: DC

## 2016-06-28 MED ORDER — FLUTICASONE PROPIONATE 50 MCG/ACT NA SUSP
2.0000 | Freq: Every day | NASAL | Status: DC | PRN
  Filled 2016-06-28: qty 16

## 2016-06-28 MED ORDER — VENLAFAXINE HCL ER 75 MG PO CP24
150.0000 mg | ORAL_CAPSULE | Freq: Every day | ORAL | Status: DC
  Administered 2016-06-28: 150 mg via ORAL
  Filled 2016-06-28: qty 2

## 2016-06-28 MED ORDER — PROMETHAZINE HCL 25 MG/ML IJ SOLN: 6.2500 mg | INTRAMUSCULAR | Status: DC | PRN

## 2016-06-28 MED ORDER — MIDAZOLAM HCL 5 MG/5ML IJ SOLN
INTRAMUSCULAR | Status: DC | PRN
  Administered 2016-06-28: 2 mg via INTRAVENOUS

## 2016-06-28 MED ORDER — VITAMIN D3 25 MCG (1000 UNIT) PO TABS
1000.0000 [IU] | ORAL_TABLET | Freq: Every day | ORAL | Status: DC
  Administered 2016-06-28 – 2016-06-29 (×2): 1000 [IU] via ORAL
  Filled 2016-06-28 (×4): qty 1

## 2016-06-28 MED ORDER — ALPRAZOLAM 0.5 MG PO TABS
0.5000 mg | ORAL_TABLET | Freq: Three times a day (TID) | ORAL | Status: DC | PRN
  Administered 2016-06-28 (×2): 0.5 mg via ORAL
  Filled 2016-06-28 (×2): qty 1

## 2016-06-28 MED ORDER — SUGAMMADEX SODIUM 200 MG/2ML IV SOLN
INTRAVENOUS | Status: DC | PRN
  Administered 2016-06-28: 200 mg via INTRAVENOUS

## 2016-06-28 MED ORDER — ROPIVACAINE HCL 5 MG/ML IJ SOLN
INTRAMUSCULAR | Status: DC | PRN
  Administered 2016-06-28: 30 mL via PERINEURAL

## 2016-06-28 MED ORDER — ACETAMINOPHEN 500 MG PO TABS: 1000.0000 mg | ORAL_TABLET | Freq: Four times a day (QID) | ORAL | Status: DC | PRN

## 2016-06-28 MED ORDER — SUCCINYLCHOLINE CHLORIDE 200 MG/10ML IV SOSY
PREFILLED_SYRINGE | INTRAVENOUS | Status: AC
  Filled 2016-06-28: qty 10

## 2016-06-28 MED ORDER — MORPHINE SULFATE (PF) 4 MG/ML IV SOLN: 2.0000 mg | INTRAVENOUS | Status: DC | PRN

## 2016-06-28 MED ORDER — OXYCODONE HCL 5 MG PO TABS
5.0000 mg | ORAL_TABLET | ORAL | Status: DC | PRN
  Administered 2016-06-28: 5 mg via ORAL
  Administered 2016-06-29 (×3): 10 mg via ORAL
  Filled 2016-06-28: qty 2
  Filled 2016-06-28: qty 1
  Filled 2016-06-28 (×2): qty 2

## 2016-06-28 MED ORDER — FENTANYL CITRATE (PF) 100 MCG/2ML IJ SOLN
INTRAMUSCULAR | Status: DC | PRN
  Administered 2016-06-28 (×2): 25 ug via INTRAVENOUS
  Administered 2016-06-28: 50 ug via INTRAVENOUS
  Administered 2016-06-28: 100 ug via INTRAVENOUS

## 2016-06-28 MED ORDER — CLINDAMYCIN PHOSPHATE 600 MG/50ML IV SOLN
600.0000 mg | Freq: Four times a day (QID) | INTRAVENOUS | Status: AC
  Administered 2016-06-28 – 2016-06-29 (×2): 600 mg via INTRAVENOUS
  Filled 2016-06-28 (×4): qty 50

## 2016-06-28 MED ORDER — BUPIVACAINE-EPINEPHRINE 0.25% -1:200000 IJ SOLN
INTRAMUSCULAR | Status: DC | PRN
  Administered 2016-06-28: 8 mL

## 2016-06-28 MED ORDER — PROPOFOL 10 MG/ML IV BOLUS
INTRAVENOUS | Status: AC
  Filled 2016-06-28: qty 20

## 2016-06-28 MED ORDER — PANTOPRAZOLE SODIUM 40 MG PO TBEC: 80.0000 mg | DELAYED_RELEASE_TABLET | Freq: Every day | ORAL | Status: DC

## 2016-06-28 MED ORDER — BENAZEPRIL HCL 20 MG PO TABS
20.0000 mg | ORAL_TABLET | Freq: Every day | ORAL | Status: DC
  Administered 2016-06-28: 20 mg via ORAL
  Filled 2016-06-28 (×2): qty 1

## 2016-06-28 MED ORDER — ONDANSETRON HCL 4 MG/2ML IJ SOLN
INTRAMUSCULAR | Status: DC | PRN
  Administered 2016-06-28: 4 mg via INTRAVENOUS

## 2016-06-28 MED ORDER — ROCURONIUM BROMIDE 100 MG/10ML IV SOLN
INTRAVENOUS | Status: DC | PRN
  Administered 2016-06-28: 40 mg via INTRAVENOUS

## 2016-06-28 MED ORDER — DEXAMETHASONE SODIUM PHOSPHATE 10 MG/ML IJ SOLN
INTRAMUSCULAR | Status: DC | PRN
  Administered 2016-06-28: 8 mg via INTRAVENOUS

## 2016-06-28 MED ORDER — METOCLOPRAMIDE HCL 5 MG/ML IJ SOLN: 5.0000 mg | Freq: Three times a day (TID) | INTRAMUSCULAR | Status: DC | PRN

## 2016-06-28 MED ORDER — ONDANSETRON HCL 4 MG/2ML IJ SOLN: 4.0000 mg | Freq: Four times a day (QID) | INTRAMUSCULAR | Status: DC | PRN

## 2016-06-28 MED ORDER — BUPIVACAINE HCL (PF) 0.25 % IJ SOLN
INTRAMUSCULAR | Status: AC
  Filled 2016-06-28: qty 30

## 2016-06-28 MED ORDER — METHOCARBAMOL 500 MG PO TABS
500.0000 mg | ORAL_TABLET | Freq: Four times a day (QID) | ORAL | Status: DC | PRN
  Administered 2016-06-28 – 2016-06-29 (×2): 500 mg via ORAL
  Filled 2016-06-28 (×2): qty 1

## 2016-06-28 MED ORDER — METOCLOPRAMIDE HCL 5 MG PO TABS: 5.0000 mg | ORAL_TABLET | Freq: Three times a day (TID) | ORAL | Status: DC | PRN

## 2016-06-28 MED ORDER — DOCUSATE SODIUM 100 MG PO CAPS
100.0000 mg | ORAL_CAPSULE | Freq: Two times a day (BID) | ORAL | Status: DC
  Administered 2016-06-28: 100 mg via ORAL
  Filled 2016-06-28 (×2): qty 1

## 2016-06-28 MED ORDER — PROPOFOL 10 MG/ML IV BOLUS
INTRAVENOUS | Status: DC | PRN
  Administered 2016-06-28: 200 mg via INTRAVENOUS

## 2016-06-28 MED ORDER — VITAMIN B-12 1000 MCG PO TABS
1000.0000 ug | ORAL_TABLET | Freq: Every day | ORAL | Status: DC
  Administered 2016-06-28 – 2016-06-29 (×2): 1000 ug via ORAL
  Filled 2016-06-28 (×2): qty 1

## 2016-06-28 MED ORDER — LORATADINE 10 MG PO TABS
10.0000 mg | ORAL_TABLET | Freq: Every day | ORAL | Status: DC
  Administered 2016-06-29: 10 mg via ORAL
  Filled 2016-06-28: qty 1

## 2016-06-28 MED ORDER — DIPHENHYDRAMINE HCL 50 MG/ML IJ SOLN
INTRAMUSCULAR | Status: AC
  Filled 2016-06-28: qty 1

## 2016-06-28 MED ORDER — OXYCODONE-ACETAMINOPHEN 5-325 MG PO TABS: 1.0000 | ORAL_TABLET | ORAL | 0 refills | Status: DC | PRN

## 2016-06-28 MED ORDER — ONDANSETRON HCL 4 MG PO TABS: 4.0000 mg | ORAL_TABLET | Freq: Four times a day (QID) | ORAL | Status: DC | PRN

## 2016-06-28 MED ORDER — LIDOCAINE HCL (CARDIAC) 20 MG/ML IV SOLN
INTRAVENOUS | Status: DC | PRN
  Administered 2016-06-28: 80 mg via INTRAVENOUS

## 2016-06-28 SURGICAL SUPPLY — 79 items
BIT DRILL 5/64X5 DISP (BIT) ×1 IMPLANT
BLADE SAW SAG 73X25 THK (BLADE) ×1
BLADE SAW SGTL 73X25 THK (BLADE) ×1 IMPLANT
BOWL SMART MIX CTS (DISPOSABLE) ×1 IMPLANT
BUR SURG 4X8 MED (BURR) IMPLANT
BURR SURG 4X8 MED (BURR)
CAPT SHLDR TOTAL 1 ×1 IMPLANT
CEMENT HV SMART SET (Cement) ×1 IMPLANT
COVER SURGICAL LIGHT HANDLE (MISCELLANEOUS) ×2 IMPLANT
DRAPE IMP U-DRAPE 54X76 (DRAPES) ×2 IMPLANT
DRAPE INCISE IOBAN 66X45 STRL (DRAPES) ×4 IMPLANT
DRAPE ORTHO SPLIT 77X108 STRL (DRAPES) ×4
DRAPE SURG ORHT 6 SPLT 77X108 (DRAPES) ×2 IMPLANT
DRAPE U-SHAPE 47X51 STRL (DRAPES) ×2 IMPLANT
DRSG ADAPTIC 3X8 NADH LF (GAUZE/BANDAGES/DRESSINGS) ×2 IMPLANT
DRSG PAD ABDOMINAL 8X10 ST (GAUZE/BANDAGES/DRESSINGS) ×3 IMPLANT
DURAPREP 26ML APPLICATOR (WOUND CARE) ×2 IMPLANT
ELECT BLADE 4.0 EZ CLEAN MEGAD (MISCELLANEOUS) ×2
ELECT NDL TIP 2.8 STRL (NEEDLE) ×1 IMPLANT
ELECT NEEDLE TIP 2.8 STRL (NEEDLE) ×2 IMPLANT
ELECT REM PT RETURN 9FT ADLT (ELECTROSURGICAL) ×2
ELECTRODE BLDE 4.0 EZ CLN MEGD (MISCELLANEOUS) ×1 IMPLANT
ELECTRODE REM PT RTRN 9FT ADLT (ELECTROSURGICAL) ×1 IMPLANT
GAUZE SPONGE 4X4 12PLY STRL (GAUZE/BANDAGES/DRESSINGS) ×2 IMPLANT
GLOVE BIOGEL PI ORTHO PRO 7.5 (GLOVE) ×1
GLOVE BIOGEL PI ORTHO PRO SZ7 (GLOVE) ×1
GLOVE BIOGEL PI ORTHO PRO SZ8 (GLOVE) ×1
GLOVE ORTHO TXT STRL SZ7.5 (GLOVE) ×2 IMPLANT
GLOVE PI ORTHO PRO STRL 7.5 (GLOVE) ×1 IMPLANT
GLOVE PI ORTHO PRO STRL SZ7 (GLOVE) ×1 IMPLANT
GLOVE PI ORTHO PRO STRL SZ8 (GLOVE) ×1 IMPLANT
GLOVE SURG ORTHO 8.5 STRL (GLOVE) ×4 IMPLANT
GOWN STRL REUS W/ TWL XL LVL3 (GOWN DISPOSABLE) ×3 IMPLANT
GOWN STRL REUS W/TWL XL LVL3 (GOWN DISPOSABLE) ×6
HANDPIECE INTERPULSE COAX TIP (DISPOSABLE)
KIT BASIN OR (CUSTOM PROCEDURE TRAY) ×2 IMPLANT
KIT ROOM TURNOVER OR (KITS) ×2 IMPLANT
MANIFOLD NEPTUNE II (INSTRUMENTS) ×2 IMPLANT
NDL 1/2 CIR MAYO (NEEDLE) ×1 IMPLANT
NDL FILTER BLUNT 18X1 1/2 (NEEDLE) IMPLANT
NDL HYPO 25GX1X1/2 BEV (NEEDLE) ×1 IMPLANT
NDL SUT 6 .5 CRC .975X.05 MAYO (NEEDLE) ×1 IMPLANT
NEEDLE 1/2 CIR MAYO (NEEDLE) ×2 IMPLANT
NEEDLE FILTER BLUNT 18X 1/2SAF (NEEDLE) ×1
NEEDLE FILTER BLUNT 18X1 1/2 (NEEDLE) ×1 IMPLANT
NEEDLE HYPO 25GX1X1/2 BEV (NEEDLE) ×2 IMPLANT
NEEDLE MAYO TAPER (NEEDLE) ×2
NS IRRIG 1000ML POUR BTL (IV SOLUTION) ×2 IMPLANT
PACK SHOULDER (CUSTOM PROCEDURE TRAY) ×2 IMPLANT
PAD ARMBOARD 7.5X6 YLW CONV (MISCELLANEOUS) ×4 IMPLANT
PIN METAGLENE 2.5 (PIN) ×1 IMPLANT
SET HNDPC FAN SPRY TIP SCT (DISPOSABLE) IMPLANT
SLING ARM FOAM STRAP LRG (SOFTGOODS) ×1 IMPLANT
SLING ARM IMMOBILIZER LRG (SOFTGOODS) ×1 IMPLANT
SLING ARM IMMOBILIZER MED (SOFTGOODS) IMPLANT
SMARTMIX MINI TOWER (MISCELLANEOUS) ×2
SPONGE LAP 18X18 X RAY DECT (DISPOSABLE) ×2 IMPLANT
SPONGE LAP 4X18 X RAY DECT (DISPOSABLE) ×1 IMPLANT
SPONGE SURGIFOAM ABS GEL SZ50 (HEMOSTASIS) ×1 IMPLANT
STRIP CLOSURE SKIN 1/2X4 (GAUZE/BANDAGES/DRESSINGS) ×2 IMPLANT
SUCTION FRAZIER HANDLE 10FR (MISCELLANEOUS) ×1
SUCTION TUBE FRAZIER 10FR DISP (MISCELLANEOUS) ×1 IMPLANT
SUT FIBERWIRE #2 38 T-5 BLUE (SUTURE) ×6
SUT MNCRL AB 4-0 PS2 18 (SUTURE) ×2 IMPLANT
SUT VIC AB 0 CT1 27 (SUTURE) ×2
SUT VIC AB 0 CT1 27XBRD ANBCTR (SUTURE) ×1 IMPLANT
SUT VIC AB 0 CT2 27 (SUTURE) ×2 IMPLANT
SUT VIC AB 2-0 CT1 27 (SUTURE) ×2
SUT VIC AB 2-0 CT1 TAPERPNT 27 (SUTURE) ×1 IMPLANT
SUT VICRYL AB 2 0 TIES (SUTURE) ×2 IMPLANT
SUTURE FIBERWR #2 38 T-5 BLUE (SUTURE) ×2 IMPLANT
SYR CONTROL 10ML LL (SYRINGE) ×2 IMPLANT
SYR TB 1ML LUER SLIP (SYRINGE) ×1 IMPLANT
TOWEL OR 17X24 6PK STRL BLUE (TOWEL DISPOSABLE) ×2 IMPLANT
TOWEL OR 17X26 10 PK STRL BLUE (TOWEL DISPOSABLE) ×2 IMPLANT
TOWER SMARTMIX MINI (MISCELLANEOUS) ×1 IMPLANT
TRAY FOLEY W/METER SILVER 16FR (SET/KITS/TRAYS/PACK) ×2 IMPLANT
WATER STERILE IRR 1000ML POUR (IV SOLUTION) ×2 IMPLANT
YANKAUER SUCT BULB TIP NO VENT (SUCTIONS) IMPLANT

## 2016-06-28 NOTE — Anesthesia Procedure Notes (Signed)
Procedure Name: Intubation Date/Time: 06/28/2016 7:31 AM Performed by: Lavell Luster Pre-anesthesia Checklist: Patient identified, Emergency Drugs available, Suction available, Patient being monitored and Timeout performed Patient Re-evaluated:Patient Re-evaluated prior to inductionOxygen Delivery Method: Circle system utilized Preoxygenation: Pre-oxygenation with 100% oxygen Intubation Type: IV induction Ventilation: Mask ventilation without difficulty Laryngoscope Size: Mac and 3 Grade View: Grade I Tube type: Oral Number of attempts: 1 Airway Equipment and Method: Stylet Placement Confirmation: ETT inserted through vocal cords under direct vision,  positive ETCO2 and breath sounds checked- equal and bilateral Secured at: 22 cm Tube secured with: Tape Dental Injury: Teeth and Oropharynx as per pre-operative assessment

## 2016-06-28 NOTE — Anesthesia Postprocedure Evaluation (Addendum)
Anesthesia Post Note  Patient: [name]  Procedure(s) Performed: Procedure(s) (LRB): RIGHT SHOULDER HEMI-ARTHROPLASTY (Right)  Patient location during evaluation: PACU Anesthesia Type: General Level of consciousness: sedated Pain management: pain level controlled Vital Signs Assessment: post-procedure vital signs reviewed and stable Respiratory status: nonlabored ventilation Cardiovascular status: stable Postop Assessment: no signs of nausea or vomiting Anesthetic complications: no        Last Vitals:  Vitals:   06/28/16 1030 06/28/16 1045  BP:    Pulse: 73 73  Resp: (!) 22 (!) 23  Temp:      Last Pain:  Vitals:   06/28/16 0950  TempSrc:   PainSc: Asleep   Pain Goal: Patients Stated Pain Goal: 2 (06/28/16 0612)               Sims Laday JR,JOHN Mateo Flow

## 2016-06-28 NOTE — Discharge Instructions (Signed)
Ice to the shoulder as much as possible  Keep the incision clean and dry and covered for one week, then ok to shower and get it wet  Prop a pillow behind the elbow to keep the arm across your waist.  Do exercises 4 time per day - pendulums, lap slides, gentle rotation of the shoulder   May remove the sling while seated but please wear while up and walking.  Follow up in the office in two week [phone]

## 2016-06-28 NOTE — Transfer of Care (Signed)
Immediate Anesthesia Transfer of Care Note  Patient: [name]  Procedure(s) Performed: Procedure(s): RIGHT SHOULDER HEMI-ARTHROPLASTY (Right)  Patient Location: PACU  Anesthesia Type:General  Level of Consciousness: awake, alert  and sedated  Airway & Oxygen Therapy: Patient connected to nasal cannula oxygen  Post-op Assessment: Post -op Vital signs reviewed and stable  Post vital signs: stable  Last Vitals:  Vitals:   06/28/16 0616  BP: (!) 151/60  Pulse: 78  Resp: 20  Temp: 37.2 C    Last Pain:  Vitals:   06/28/16 0616  TempSrc: Oral  PainSc:       Patients Stated Pain Goal: 2 (48/18/59 0931)  Complications: No apparent anesthesia complications

## 2016-06-28 NOTE — Interval H&P Note (Signed)
History and Physical Interval Note:  06/28/2016 7:19 AM  [name]  has presented today for surgery, with the diagnosis of Right sholder osteoarthritis and rotator cuff tear  The various methods of treatment have been discussed with the patient and family. After consideration of risks, benefits and other options for treatment, the patient has consented to  Procedure(s): ANATOMIC RIGHT TOTAL SHOULDER ARTHROPLASTY AND ROTATOR CUFF REPAIR (Right) as a surgical intervention .  The patient's history has been reviewed, patient examined, no change in status, stable for surgery.  I have reviewed the patient's chart and labs.  Questions were answered to the patient's satisfaction.     [name],STEVEN R

## 2016-06-28 NOTE — Anesthesia Procedure Notes (Addendum)
Anesthesia Regional Block: Interscalene brachial plexus block   Pre-Anesthetic Checklist: ,, timeout performed, Correct Patient, Correct Site, Correct Laterality, Correct Procedure, Correct Position, site marked, Risks and benefits discussed,  Surgical consent,  Pre-op evaluation,  At surgeon's request and post-op pain management  Laterality: Right and Upper  Prep: chloraprep       Needles:  Injection technique: Single-shot  Needle Type: Echogenic Stimulator Needle     Needle Length: 5cm  Needle Gauge: 21   Needle insertion depth: 4 cm   Additional Needles:   Procedures: ultrasound guided,,,,,,,,  Narrative:  Start time: 06/28/2016 7:05 AM End time: 06/28/2016 7:19 AM Injection made incrementally with aspirations every 5 mL.  Performed by: Personally  Anesthesiologist: Lyn Hollingshead

## 2016-06-28 NOTE — Brief Op Note (Signed)
06/28/2016  9:58 AM  PATIENT:  [name]  66 y.o. female  PRE-OPERATIVE DIAGNOSIS:  Right sholder osteoarthritis and rotator cuff tear  POST-OPERATIVE DIAGNOSIS:  Right sholder osteoarthritis and rotator cuff tear  PROCEDURE:  Procedure(s): RIGHT SHOULDER HEMI-ARTHROPLASTY (Right) repair of rotator cuff tear  SURGEON:  Surgeon(s) and Role:    Netta Cedars, MD - Primary  PHYSICIAN ASSISTANT:   ASSISTANTS: Ventura Bruns, PA-C   ANESTHESIA:   regional and general  EBL:  Total I/O In: 500 [I.V.:500] Out: 200 [Blood:200]  BLOOD ADMINISTERED:none  DRAINS: none   LOCAL MEDICATIONS USED:  MARCAINE     SPECIMEN:  No Specimen  DISPOSITION OF SPECIMEN:  N/A  COUNTS:  YES  TOURNIQUET:  * No tourniquets in log *  DICTATION: .Other Dictation: Dictation Number [id number]  PLAN OF CARE: Admit to inpatient   PATIENT DISPOSITION:  PACU - hemodynamically stable.   Delay start of Pharmacological VTE agent (>24hrs) due to surgical blood loss or risk of bleeding: not applicable

## 2016-06-29 LAB — BASIC METABOLIC PANEL
ANION GAP: 9 (ref 5–15)
BUN: 9 mg/dL (ref 6–20)
CALCIUM: 8.7 mg/dL — AB (ref 8.9–10.3)
CO2: 23 mmol/L (ref 22–32)
Chloride: 104 mmol/L (ref 101–111)
Creatinine, Ser: 0.48 mg/dL (ref 0.44–1.00)
GFR calc non Af Amer: >60 mL/min (ref >60)
Glucose, Bld: 101 mg/dL — ABNORMAL HIGH (ref 65–99)
POTASSIUM: 3.8 mmol/L (ref 3.5–5.1)
SODIUM: 136 mmol/L (ref 135–145)

## 2016-06-29 LAB — HEMOGLOBIN AND HEMATOCRIT, BLOOD
HEMATOCRIT: 35.1 % — AB (ref 36.0–46.0)
HEMOGLOBIN: 11.5 g/dL — AB (ref 12.0–15.0)

## 2016-06-29 NOTE — Discharge Summary (Signed)
Physician Discharge Summary  Patient ID: [name] MRN: [id number] DOB/AGE: 64/07/1950 64 y.o.  Admit date: 06/28/2016 Discharge date: 06/29/2016  Admission Diagnoses:  Pain in joint, shoulder region  Discharge Diagnoses:  Principal Problem:   Pain in joint, shoulder region Active Problems:   S/P shoulder replacement, right   Past Medical History:  Diagnosis Date  . Anemia   . Anxiety   . Arthritis   . Depression   . Dysrhythmia    occ due to caffeine intake  . Encounter for long-term (current) use of other medications   . GERD (gastroesophageal reflux disease)   . Hepatitis   . History of hiatal hernia   . Hypertension   . IBS (irritable bowel syndrome)   . Obesity   . Osteoarthritis   . Pain in joint, multiple sites   . Pneumonia   . PONV (postoperative nausea and vomiting)    in the 1980's no problems since  . Psoriatic arthropathy ([place])   . Raynaud disease     Surgeries: Procedure(s): RIGHT SHOULDER HEMI-ARTHROPLASTY on 06/28/2016   Consultants (if any):   Discharged Condition: Improved  Hospital Course: [name] is an 66 y.o. female who was admitted 06/28/2016 with a diagnosis of Pain in joint, shoulder region and went to the operating room on 06/28/2016 and underwent the above named procedures.    She was given perioperative antibiotics:  Anti-infectives    Start     Dose/Rate Route Frequency Ordered Stop   06/28/16 1300  clindamycin (CLEOCIN) IVPB 600 mg     600 mg 100 mL/hr over 30 Minutes Intravenous Every 6 hours 06/28/16 1136 06/29/16 0659   06/28/16 1135  valACYclovir (VALTREX) tablet 1,000 mg  Status:  Discontinued    Comments:  For 3 days as needed     1,000 mg Oral Daily PRN 06/28/16 1136 06/29/16 1411   06/28/16 0730  clindamycin (CLEOCIN) IVPB 900 mg     900 mg 100 mL/hr over 30 Minutes Intravenous To [facility] 06/27/16 1005 06/28/16 0750    .  She was given sequential compression devices and early ambulation for DVT  prophylaxis.  She benefited maximally from the hospital stay and there were no complications.    Recent vital signs:  Vitals:   06/29/16 0210 06/29/16 0532  BP: (!) 117/54 (!) 110/58  Pulse: 69 70  Resp:    Temp: 98 F (36.7 C) 98 F (36.7 C)    Recent laboratory studies:  Lab Results  Component Value Date   HGB 11.5 (L) 06/29/2016   HGB 12.3 06/20/2016   HGB 12.6 04/02/2016   Lab Results  Component Value Date   WBC 8.6 06/20/2016   PLT 270 06/20/2016   Lab Results  Component Value Date   INR 1.8 (H) 03/06/2007   Lab Results  Component Value Date   NA 136 06/29/2016   K 3.8 06/29/2016   CL 104 06/29/2016   CO2 23 06/29/2016   BUN 9 06/29/2016   CREATININE 0.48 06/29/2016   GLUCOSE 101 (H) 06/29/2016    Discharge Medications:   Allergies as of 06/29/2016      Reactions   Vioxx [rofecoxib] Swelling   EXTREME LEG SWELLING    Bactrim [sulfamethoxazole-trimethoprim] Diarrhea, Nausea And Vomiting   Banana Itching, Rash   Cephalosporins Diarrhea, Nausea And Vomiting   Ciprofloxacin Diarrhea, Nausea And Vomiting   Coconut Flavor Itching, Rash   Codeine Nausea Only   Keflex [cephalexin] Diarrhea   DOSES > 250  MG RESULTS IN ABD PAIN   Zithromax [azithromycin] Diarrhea, Nausea And Vomiting      Medication List    TAKE these medications   acetaminophen 500 MG tablet Commonly known as:  TYLENOL Take 1,000 mg by mouth every 6 (six) hours as needed for mild pain.   ALPRAZolam 0.5 MG tablet Commonly known as:  XANAX Take 1 tablet (0.5 mg total) by mouth 3 (three) times daily as needed. What changed:  how much to take  when to take this  additional instructions   benazepril 20 MG tablet Commonly known as:  LOTENSIN Take 1 tablet (20 mg total) by mouth daily.   CENTRUM SILVER PO Take 1 tablet by mouth daily.   cetirizine 10 MG tablet Commonly known as:  ZYRTEC Take 10 mg by mouth daily.   diclofenac 75 MG EC tablet Commonly known as:   VOLTAREN TAKE 1 TABLET BY MOUTH 2 TIMES DAILY AS NEEDED FOR JOINT PAIN What changed:  how much to take  how to take this  when to take this  additional instructions   esomeprazole 20 MG capsule Commonly known as:  NEXIUM Take 1 capsule (20 mg total) by mouth daily.   fluconazole 100 MG tablet Commonly known as:  DIFLUCAN TAKE 1 TABLET BY MOUTH EVERY OTHER DAYS FOR 7 DOSES, THEN TAKE 1 TABLET ONCE WEEKLY THEREAFTER   fluticasone 50 MCG/ACT nasal spray Commonly known as:  FLONASE Place 2 sprays into both nostrils daily as needed for allergies.   furosemide 20 MG tablet Commonly known as:  LASIX TAKE 1 OR 2 TABLETS BY MOUTH DAILY FOR LOWER EXTREMITY SWELLING What changed:  how much to take  how to take this  when to take this  additional instructions   ibuprofen 200 MG tablet Commonly known as:  ADVIL,MOTRIN Take 400 mg by mouth every 6 (six) hours as needed for mild pain.   oxyCODONE-acetaminophen 5-325 MG tablet Commonly known as:  ROXICET Take 1-2 tablets by mouth every 4 (four) hours as needed for severe pain.   valACYclovir 500 MG tablet Commonly known as:  VALTREX Take 1,000 mg by mouth daily as needed (outbreaks). For 3 days as needed   venlafaxine XR 150 MG 24 hr capsule Commonly known as:  EFFEXOR-XR Take 1 capsule (150 mg total) by mouth daily. What changed:  when to take this  additional instructions   venlafaxine XR 75 MG 24 hr capsule Commonly known as:  EFFEXOR-XR TAKE 1 CAPSULE BY MOUTH ONCE DAILY (IN ADDITION TO 150MG  CAPSULE FOR MAX OF 225MG  PER DAY) What changed:  how much to take  how to take this  when to take this  additional instructions   vitamin B-12 1000 MCG tablet Commonly known as:  CYANOCOBALAMIN Take 1,000 mcg by mouth daily.   Vitamin D3 1000 units Caps Take 1,000 Units by mouth daily.       Diagnostic Studies: Dg Shoulder Right Port  Result Date: 06/28/2016 CLINICAL DATA:  Status post total shoulder  arthroplasty EXAM: PORTABLE RIGHT SHOULDER:  1 V COMPARISON:  None. FINDINGS: Frontal view obtained. There is a total shoulder replacement with prosthetic component well-seated. No fracture or dislocation. No appreciable arthropathy. Visualized right lung clear. IMPRESSION: Total shoulder prosthetic component well-seated. No evident fracture or dislocation on frontal view. Electronically Signed   By: Lowella Grip III M.D.   On: 06/28/2016 11:09    Disposition: 01-Home or Self Care  Discharge Instructions    Call MD / Call 911  Complete by:  As directed    If you experience chest pain or shortness of breath, CALL 911 and be transported to the hospital emergency room.  If you develope a fever above 101 F, pus (white drainage) or increased drainage or redness at the wound, or calf pain, call your surgeon's office.   Constipation Prevention    Complete by:  As directed    Drink plenty of fluids.  Prune juice may be helpful.  You may use a stool softener, such as Colace (over the counter) 100 mg twice a day.  Use MiraLax (over the counter) for constipation as needed.   Diet - low sodium heart healthy    Complete by:  As directed    Discharge instructions    Complete by:  As directed    See Dr. Veverly Fells' discharge instructions   Driving restrictions    Complete by:  As directed    No driving for 6 weeks   Increase activity slowly as tolerated    Complete by:  As directed    Lifting restrictions    Complete by:  As directed    No lifting for 6 weeks      Follow-up Information    Netta Cedars, MD. Call in 2 weeks.   Specialty:  Orthopedic Surgery Why:  [phone] Contact information: [address] [place] [address] [phone]            Signed: Elie Goody 06/29/2016, 10:28 PM

## 2016-06-29 NOTE — Plan of Care (Signed)
Problem: Pain Managment: Goal: General experience of comfort will improve Outcome: Progressing Educated patient on the importance of staying ahead of the pain. Patient verbalizes understanding

## 2016-06-29 NOTE — Progress Notes (Signed)
   Subjective:  Patient reports pain as mild to moderate.  Had pain when block wore off - doing better now. Denies N/V/CP/SOB.  Objective:   VITALS:   Vitals:   06/28/16 1300 06/28/16 2210 06/29/16 0210 06/29/16 0532  BP: 124/77 (!) 101/56 (!) 117/54 (!) 110/58  Pulse: 76 74 69 70  Resp: 20     Temp: 98.1 F (36.7 C) 98.2 F (36.8 C) 98 F (36.7 C) 98 F (36.7 C)  TempSrc: Oral Oral Oral Oral  SpO2: 93% 97% 98% 93%    NAD ABD soft Neurovascular intact Incision: dressing C/D/I Sling intact  Lab Results  Component Value Date   WBC 8.6 06/20/2016   HGB 11.5 (L) 06/29/2016   HCT 35.1 (L) 06/29/2016   MCV 89.1 06/20/2016   PLT 270 06/20/2016   BMET    Component Value Date/Time   NA 136 06/29/2016 0642   K 3.8 06/29/2016 0642   CL 104 06/29/2016 0642   CO2 23 06/29/2016 0642   GLUCOSE 101 (H) 06/29/2016 0642   BUN 9 06/29/2016 0642   CREATININE 0.48 06/29/2016 0642   CREATININE 0.55 08/02/2015 1236   CALCIUM 8.7 (L) 06/29/2016 0642   GFRNONAA >60 06/29/2016 0642   GFRNONAA >89 08/02/2015 1236   GFRAA >60 06/29/2016 0642   GFRAA >89 08/02/2015 1236     Assessment/Plan: 1 Day Post-Op   Active Problems:   S/P shoulder replacement, right   NWB RUE Sling PO pain control OT D/C today   Nichlos Kunzler, Horald Pollen 06/29/2016, 7:54 AM   Rod Can, MD Cell [phone]

## 2016-06-29 NOTE — Evaluation (Signed)
Occupational Therapy Evaluation Patient Details Name: [name] MRN: [id number] DOB: September 18, 1950 Today's Date: 06/29/2016    History of Present Illness 66 y.o.female admitted for right sholder osteoarthritis and rotator cuff tear, now s/p right shoulder hemi-artroplasty repair. PMH of arthritis, GERD, anxiety, and TKA.   Clinical Impression   PTA, pt was independent and living with her husband. Currently, pt required Min A and VCs for UB ADLs to maintain shoulder precautions. Pt and husband educated on shoulder precautions, UB bathing, UB dressing, donning/doffing sling, edema management, and exercises. Pt and husband demonstrated and verbalized understanding. Provided pt with handout of all information and education. Recommend pt dc home once medically stable per physician. Answered all pt questions and all acute OT needs met. Will sign off. Thank you.     Follow Up Recommendations  No OT follow up;Supervision - Intermittent    Equipment Recommendations  None recommended by OT    Recommendations for Other Services       Precautions / Restrictions Precautions Precautions: Shoulder (Conservative) Type of Shoulder Precautions: Conservative protocal. No shoulder ROM.  Shoulder Interventions: At all times;Off for dressing/bathing/exercises Precaution Booklet Issued: Yes (comment) Precaution Comments: Educated pt on ROM and WB precautions Required Braces or Orthoses: Sling Restrictions Weight Bearing Restrictions: Yes RUE Weight Bearing: Non weight bearing      Mobility Bed Mobility Overal bed mobility: Modified Independent             General bed mobility comments: Increased time. Pt also reports she will be sleeping in recliner initially to maintain shoulder precaution and increase safety at home  Transfers Overall transfer level: Modified independent               General transfer comment: Increased time    Balance Overall balance assessment: No apparent  balance deficits (not formally assessed)                                         ADL either performed or assessed with clinical judgement   ADL Overall ADL's : Needs assistance/impaired Eating/Feeding: Set up;Sitting   Grooming: Applying deodorant;Modified independent;Standing   Upper Body Bathing: Moderate assistance;Sitting;Adhering to UE precautions;Cueing for safety;Cueing for sequencing;Cueing for compensatory techniques;Cueing for UE precautions Upper Body Bathing Details (indicate cue type and reason): Educated pt on safe technique and requires tactile cues for no shoulder movement Lower Body Bathing: Moderate assistance;Cueing for sequencing;Sit to/from stand   Upper Body Dressing : Minimal assistance;Adhering to UE precautions;Cueing for safety;Cueing for sequencing;Cueing for compensatory techniques;Sitting;With caregiver independent assisting Upper Body Dressing Details (indicate cue type and reason): Pt donned shirt and slign with Min A and cues for techique. Pt husband verbalized understanding.  Lower Body Dressing: Sit to/from stand;Cueing for safety;Min guard Lower Body Dressing Details (indicate cue type and reason): Pt donned underwear and pants with Min guard for safety Toilet Transfer: Min guard;Ambulation (Simulated to recliner)         Tub/Shower Transfer Details (indicate cue type and reason): Discussed safe use of shower seat. Husband states he will be present Functional mobility during ADLs: Modified independent (Increased time) General ADL Comments: Pt demonstrating safe performance of ADLs and functional mobility. Pt requires Min VCs for maintaining shoulder precautions during bathing and dressing.      Vision         Perception     Praxis      Pertinent Vitals/Pain  Pain Assessment: 0-10 Pain Score: 7  Pain Location: R shoulder Pain Descriptors / Indicators: Constant;Discomfort;Grimacing;Operative site guarding Pain Intervention(s):  Monitored during session;Ice applied     Hand Dominance Right   Extremity/Trunk Assessment Upper Extremity Assessment Upper Extremity Assessment: RUE deficits/detail RUE Deficits / Details: No shoulder ROM per conservative protocal. WFL for AROM of elbow, wrist, and hand RUE: Unable to fully assess due to immobilization RUE Sensation:  (Intact) RUE Coordination: decreased fine motor;decreased gross motor   Lower Extremity Assessment Lower Extremity Assessment: Overall WFL for tasks assessed       Communication Communication Communication: No difficulties   Cognition Arousal/Alertness: Awake/alert Behavior During Therapy: WFL for tasks assessed/performed Overall Cognitive Status: Within Functional Limits for tasks assessed                                     General Comments  Pt husband present throughout session. Provided shoulder handout    Exercises Exercises: Shoulder Shoulder Exercises Elbow Flexion: AROM;Right;5 reps;Seated Elbow Extension: AROM;Right;5 reps;Seated Wrist Flexion: AROM;Right;5 reps;Seated Wrist Extension: AROM;Right;5 reps;Seated Digit Composite Flexion: AROM;Right;5 reps;Seated Composite Extension: AROM;Right;5 reps;Seated Neck Flexion: AROM;5 reps;Seated Neck Extension: AROM;5 reps;Seated Neck Lateral Flexion - Right: AROM;5 reps;Seated Neck Lateral Flexion - Left: AROM;5 reps;Seated   Shoulder Instructions Shoulder Instructions Donning/doffing shirt without moving shoulder: Caregiver independent with task;Patient able to independently direct caregiver;Minimal assistance Method for sponge bathing under operated UE: Minimal assistance;Caregiver independent with task;Patient able to independently direct caregiver Donning/doffing sling/immobilizer: Minimal assistance;Caregiver independent with task;Patient able to independently direct caregiver Correct positioning of sling/immobilizer: Caregiver independent with task;Patient able to  independently direct caregiver Pendulum exercises (written home exercise program):  (NA) ROM for elbow, wrist and digits of operated UE: Caregiver independent with task;Patient able to independently direct caregiver Sling wearing schedule (on at all times/off for ADL's): Caregiver independent with task;Patient able to independently direct caregiver Proper positioning of operated UE when showering: Caregiver independent with task;Patient able to independently direct caregiver Dressing change: Caregiver independent with task;Patient able to independently direct caregiver Positioning of UE while sleeping: Caregiver independent with task;Patient able to independently direct caregiver    Home Living Family/patient expects to be discharged to:: Private residence Living Arrangements: Spouse/significant other Available Help at Discharge: Family Type of Home: House Home Access: Stairs to enter Technical brewer of Steps: 4 Entrance Stairs-Rails: Right;Left Home Layout: One level     Bathroom Shower/Tub: Teacher, early years/pre: Standard     Home Equipment: [place] held Veterinary surgeon - single point          Prior Functioning/Environment Level of Independence: Independent        Comments: ADL, IADLs, driving        OT Problem List: Decreased range of motion;Decreased activity tolerance;Decreased safety awareness;Decreased knowledge of use of DME or AE;Decreased knowledge of precautions;Pain;Impaired UE functional use      OT Treatment/Interventions:      OT Goals(Current goals can be found in the care plan section) Acute Rehab OT Goals Patient Stated Goal: Go home OT Goal Formulation: With patient Time For Goal Achievement: 07/13/16 Potential to Achieve Goals: Good  OT Frequency:     Barriers to D/C:            Co-evaluation              AM-PAC PT "6 Clicks" Daily Activity     Outcome Measure Help from  another person eating meals?:  None Help from another person taking care of personal grooming?: None Help from another person toileting, which includes using toliet, bedpan, or urinal?: A Little Help from another person bathing (including washing, rinsing, drying)?: A Little Help from another person to put on and taking off regular upper body clothing?: A Little Help from another person to put on and taking off regular lower body clothing?: None 6 Click Score: 21   End of Session Equipment Utilized During Treatment: Other (comment) (Sling) Nurse Communication: Mobility status;Precautions;Weight bearing status  Activity Tolerance: Patient tolerated treatment well Patient left: in chair;with call bell/phone within reach;with family/visitor present  OT Visit Diagnosis: Pain;Other (comment) (Limited UE funcitonal use) Pain - Right/Left: Right Pain - part of body: Shoulder                Time: 9688-6484 OT Time Calculation (min): 30 min Charges:  OT General Charges $OT Visit: 1 Procedure OT Evaluation $OT Eval Low Complexity: 1 Procedure OT Treatments $Self Care/Home Management : 8-22 mins G-Codes:     [employer], OTR/L [place] 06/29/2016, 10:34 AM

## 2016-07-01 ENCOUNTER — Encounter (HOSPITAL_COMMUNITY): Payer: Self-pay | Admitting: Orthopedic Surgery

## 2016-07-01 NOTE — Op Note (Signed)
NAME:  [name], ROELL                    ACCOUNT NO.:  MEDICAL RECORD NO.:  [id number]  LOCATION:                                 FACILITY:  PHYSICIAN:  Doran Heater. Veverly Fells, M.D. DATE OF BIRTH:  04-15-1950  DATE OF PROCEDURE:  06/28/2016 DATE OF DISCHARGE:                              OPERATIVE REPORT   PREOPERATIVE DIAGNOSES:  Right shoulder osteoarthritis and rotator cuff tear as well as a profound shoulder dysfunction.  POSTOPERATIVE DIAGNOSES:  Right shoulder osteoarthritis and rotator cuff tear as well as a profound shoulder dysfunction.  PROCEDURE PERFORMED:  Right shoulder hemiarthroplasty using [place] with primary repair of rotator cuff tear.  ATTENDING SURGEON:  Doran Heater. Veverly Fells, MD.  ASSISTANT:  Ventura Bruns, [facility], who was scrubbed in the entirety of procedure and necessary for satisfactory completion of surgery.  ANESTHESIA:  General anesthesia was used plus interscalene block.  ESTIMATED BLOOD LOSS:  Minimal.  FLUID REPLACEMENT:  1200 mL crystalloid.  INSTRUMENT COUNTS:  Correct.  COMPLICATIONS:  There were no complications.  ANTIBIOTICS:  Perioperative antibiotics were given.  INDICATIONS:  The patient is a 66 year old female with worsening right shoulder pain and dysfunction.  The patient has had several years now where she is not being able to elevate her arm at all.  She complains of worsening pain to the point where she hurts both at rest and with any type of activities of daily living.  The patient has had a thorough workup, chronic workup including x-rays, MRI scan, and has also been seen by another shoulder surgeon in [place] to try to formulate the best plan for her.  We discussed all options given her young age of 75, consideration for reverse shoulder arthroplasty was felt not to be appropriate for her.  Also, she has significant evidence of deltoid atrophy on her coronal T1 imaging on MR.  We finally came up with the plan  that we felt would give her the best chance at restoring some function and hopefully limiting pain that would be either total shoulder arthroplasty or hemiarthroplasty, and she also has rotator cuff tear that the plan was to repair.  Risks and benefits of surgery discussed, informed consent obtained.  DESCRIPTION OF PROCEDURE:  After an adequate level of anesthesia achieved, the patient was positioned in modified beach-chair position. Right shoulder correctly identified, sterilely prepped and draped in usual manner.  Time-out called.  We entered the shoulder using a standard deltopectoral incision, started at the coracoid process, extending down to the anterior humerus.  Dissection was down through the subcutaneous tissues.  The cephalic vein was identified, taken laterally to the deltoid, pectoralis taken medially.  Conjoint tendon identified, retracted medially.  Deep retractors placed.  The biceps was tenodesed in situ with a figure-of-eight suture incorporating the pectoralis tendon.  We then released the subscapularis subperiosteally off the lesser tuberosity.  The subscapularis was in okay, but not great shape. We tagged it for repair at the end with #2 FiberWire suture.  Next, we went ahead and placed our T-handled Crego elevator over the humeral head.  There was advanced chondromalacia and severe bone-on-bone arthritis noted.  Once  we had the T-handled Crego in position and a large Crego medially, we externally rotated the arm about 20 degrees and then made our neck cut with a 20-degree retroverted cut.  We removed excess osteophytes with a rongeur.  We then went ahead and extended the shoulder and we were able to prepare the humerus starting with a 6 mm reamer, reamed up to size 12.  We then broached with a 10 and a 12 broach and then placed the trial in place and felt like this gave [place] good stable fixation.  The bone quality was very poor.  We knew we were going to augment  with cement as well.  We removed the trial components. We went to the glenoid side.  We did release the biceps, removed the labrum, which had extensive pan labral tear.  We then released the capsule and removed some of the hypertrophied capsule.  At this point, we noted the patient to have a very diminutive glenoid and measured it at 36 to 37 mm.  It was too small for the smallest glenoid component, which was a 40 mm component.  Decision was made to do a ream and run at this point to give her the best surface, which were to articulate with the 44 head.  We went ahead and placed our central guide pin.  We then did a gentle reaming, which gave a nice concentric socket.  We should get some good fibrocartilage on that ream and run surface and this will give her a best option.  We irrigated thoroughly.  We then went ahead and placed drill holes and sutures in for repair of the subscapularis. We then irrigated and dried the humeral canal and then cemented the size 12, body 12 proximal metaphyseal component and [employer] Unite stem in 20 degrees of retroversion.  Once that was cemented in place and the cement was hardened, then we used a trial 44 x 18 eccentric that gave our best coverage of the proximal humerus.  With that trial in place, we reduced the shoulder.  It was nice and stable, had excellent range of motion. We removed the trial, selected the real 44 x 18.  We would also place sutures in the rotator cuff tendon and tied those to greater tuberosity to repair the rotator cuff and at this point, with the real humeral head in place with good coverage, we reduced the shoulder and then repaired the subscapularis anatomically back to bone with our sutures through bone, also repairing the rotator interval.  We had a nice solid repair of the rotator cuff, felt like this gives her decent chance that a stable shoulder that will not hurt and that she can use for basic functional activities.  We then  closed in the following layers.  We used 0 Vicryl suture for the deltopectoral incision, 2-0 Vicryl subcutaneous closure, and 4-0 Monocryl for skin.  Steri-Strips applied, followed by sterile dressing and shoulder sling.  The patient tolerated the surgery well.     Doran Heater. Veverly Fells, M.D.     SRN/MEDQ  D:  06/28/2016  T:  06/28/2016  Job:  [id number]

## 2016-07-26 NOTE — Addendum Note (Signed)
Addendum  created 07/26/16 1231 by Lyn Hollingshead, MD   Sign clinical note

## 2016-09-20 ENCOUNTER — Other Ambulatory Visit: Payer: Self-pay | Admitting: Otolaryngology

## 2016-09-20 DIAGNOSIS — H903 Sensorineural hearing loss, bilateral: Secondary | ICD-10-CM

## 2016-10-02 ENCOUNTER — Ambulatory Visit (INDEPENDENT_AMBULATORY_CARE_PROVIDER_SITE_OTHER): Payer: BLUE CROSS/BLUE SHIELD | Admitting: Internal Medicine

## 2016-10-02 ENCOUNTER — Encounter: Payer: Self-pay | Admitting: Internal Medicine

## 2016-10-02 VITALS — BP 90/54 | HR 83 | Temp 97.5°F | Wt 207.0 lb

## 2016-10-02 DIAGNOSIS — R197 Diarrhea, unspecified: Secondary | ICD-10-CM

## 2016-10-02 MED ORDER — DIPHENOXYLATE-ATROPINE 2.5-0.025 MG PO TABS: 2.0000 | ORAL_TABLET | Freq: Four times a day (QID) | ORAL | 0 refills | Status: DC | PRN

## 2016-10-02 NOTE — Progress Notes (Signed)
Subjective:    [name] - 66 y.o. female MRN [id number]  Date of birth: 12/25/1950  HPI  [name] is here for diarrhea.  DIARRHEA  Having diarrhea for about 6 weeks. Has history of IBS mostly constipation type but with occasional diarrhea.  Progression: Has had increased frequency of diarrhea  Stools per day: 11-12 although will have multiple episodes of diarrhea at once which she counts towards her total account  Does diarrhea wake patient: very occasionally  Medications tried: immodium and kaopectate  Recent travel: no Sick contacts: no Ingested suspicious foods: no Antibiotics recently: no Immunocompromised: No   Symptoms Vomiting: Nausea but no emesis.  Abdominal pain: Cramping, aching abdominal pain with and without bowel movements.  Weight Loss: Down 7lbs since 214  Decreased urine output: no Lightheadedness: no Fever: no Bloody stools: no bright red blood; black tarry stools today only    -  reports that she has never smoked. She has never used smokeless tobacco. - Review of Systems: Per HPI. - Past Medical History: Patient Active Problem List   Diagnosis Date Noted  . S/P shoulder replacement, right 06/28/2016  . Vitamin D deficiency 12/05/2015  . Vitamin B 12 deficiency 12/05/2015  . H/O TB skin testing 12/05/2015  . AC (acromioclavicular) joint arthritis 08/29/2015  . Otitis media, acute suppurative 11/25/2014  . Well adult health check 09/01/2013  . Hammertoe 11/20/2012  . Psoriatic arthritis ([place]) 04/10/2012  . Pulmonary nodule seen on imaging study 11/08/2011  . Dysthymia 12/12/2010  . Pain in joint, shoulder region 10/03/2008  . Essential hypertension, benign 04/27/2008  . Palpitations 10/30/2007  . METATARSALGIA 10/02/2006  . KNEE REPLACEMENT, LEFT, HX OF 07/03/2006  . OBESITY, NOS 04/17/2006  . PANIC ATTACKS 04/17/2006  . GASTROESOPHAGEAL REFLUX, NO ESOPHAGITIS 04/17/2006  . CHOLELITHIASIS, NOS 04/17/2006  . VAGINITIS, ATROPHIC  04/17/2006  . ARTHRALGIA, UNSPECIFIED 04/17/2006   - Medications: reviewed and updated   Objective:   Physical Exam BP (!) 90/54   Pulse 83   Temp (!) 97.5 F (36.4 C) (Oral)   Wt 207 lb (93.9 kg)   SpO2 98%   BMI 41.81 kg/m  Gen: NAD, alert, cooperative with exam, well-appearing Abd: hyperactive bowel sounds, diffuse TTP without localization, no rebound or guarding, negative Murphy's sign, negative McBurney's point   Assessment & Plan:   1. Diarrhea, unspecified type Patient with known history of IBS with significant diarrhea over the past 6 weeks. Given chronicity of process and non-localizing abdominal exam doubt acute abdomen that would warrant further imaging at present. May be IBS flare but would like to check for infectious process or parasitic process causing the diarrhea. Suspect black, tarry stools today related to kaopectate as she took her first dose last night. No other signs concerning for blood that would warrant urgent GI evaluation. However, if symptoms were to persist would have low threshold for GI referral. Patient due for colonoscopy in 2019 as well. Will obtain CBC with diff and ESR to check for inflammatory process. Will obtain BMET to ensure electrolytes stable with significant diarrhea. Discussed case with Dr. Raynelle Bring who helped to formulate this plan.  - diphenoxylate-atropine (LOMOTIL) 2.5-0.025 MG tablet; Take 2 tablets by mouth 4 (four) times daily as needed for diarrhea or loose stools.  Dispense: 30 tablet; Refill: 0 - CBC With Differential - Basic Metabolic Panel - Ova and parasite examination - GI Profile, Stool, PCR - Stool culture - Fecal occult blood, imunochemical - Sedimentation Rate   Barnetta Chapel  Juleen China, D.O. 10/02/2016, 4:45 PM PGY-3, [place]

## 2016-10-02 NOTE — Patient Instructions (Addendum)
I have prescribed Lomotil which is an anti-diarrheal agent. You can take one or two pills up to four times per day. We will get some stool studies and blood work today.   Please follow up with Dr. Nori Riis within the next couple of weeks. Return sooner if diarrhea not improving.

## 2016-10-03 LAB — CBC WITH DIFFERENTIAL
BASOS: 0 %
Basophils Absolute: 0 10*3/uL (ref 0.0–0.2)
EOS (ABSOLUTE): 0.1 10*3/uL (ref 0.0–0.4)
Eos: 1 %
HEMOGLOBIN: 13.1 g/dL (ref 11.1–15.9)
Hematocrit: 40.2 % (ref 34.0–46.6)
IMMATURE GRANS (ABS): 0 10*3/uL (ref 0.0–0.1)
Immature Granulocytes: 0 %
LYMPHS ABS: 3.2 10*3/uL — AB (ref 0.7–3.1)
LYMPHS: 42 %
MCH: 27.7 pg (ref 26.6–33.0)
MCHC: 32.6 g/dL (ref 31.5–35.7)
MCV: 85 fL (ref 79–97)
Monocytes Absolute: 1.4 10*3/uL — ABNORMAL HIGH (ref 0.1–0.9)
Monocytes: 19 %
NEUTROS ABS: 3 10*3/uL (ref 1.4–7.0)
Neutrophils: 38 %
RBC: 4.73 x10E6/uL (ref 3.77–5.28)
RDW: 14.2 % (ref 12.3–15.4)
WBC: 7.7 10*3/uL (ref 3.4–10.8)

## 2016-10-03 LAB — BASIC METABOLIC PANEL
BUN / CREAT RATIO: 21 (ref 12–28)
BUN: 16 mg/dL (ref 8–27)
CO2: 25 mmol/L (ref 20–29)
CREATININE: 0.75 mg/dL (ref 0.57–1.00)
Calcium: 8.7 mg/dL (ref 8.7–10.3)
Chloride: 98 mmol/L (ref 96–106)
GFR, EST AFRICAN AMERICAN: 97 mL/min/{1.73_m2} (ref >59)
GFR, EST NON AFRICAN AMERICAN: 85 mL/min/{1.73_m2} (ref >59)
Glucose: 109 mg/dL — ABNORMAL HIGH (ref 65–99)
Potassium: 4.7 mmol/L (ref 3.5–5.2)
Sodium: 137 mmol/L (ref 134–144)

## 2016-10-03 LAB — SEDIMENTATION RATE: Sed Rate: 2 mm/hr (ref 0–40)

## 2016-10-07 ENCOUNTER — Telehealth: Payer: Self-pay | Admitting: *Deleted

## 2016-10-07 NOTE — Telephone Encounter (Signed)
Patient called again stating she took one of the lomotil tablets. However, she gets very dizzy, shaky and weak after taking the medication. The symptoms last about 45 min-1 hour after. She also reported stomach pain and lightheadedness. Patient is requesting lab results as well. Will forward to PCP for further advise.  Derl Barrow, RN

## 2016-10-07 NOTE — Telephone Encounter (Signed)
Patient left message on nurse line requesting return call to discuss stool tests done 8/15. Also, states lomotil has helped some but only if stomach completely empty. Feeling "hot, sweaty and shaky" with any exertion after taking lomotil. Returned patient's call. LM requesting return call to discuss. Hubbard Hartshorn, RN, BSN

## 2016-10-08 LAB — FECAL OCCULT BLOOD, IMMUNOCHEMICAL: Fecal Occult Bld: POSITIVE — AB

## 2016-10-08 MED ORDER — METRONIDAZOLE 500 MG PO TABS: 500.0000 mg | ORAL_TABLET | Freq: Three times a day (TID) | ORAL | 0 refills | Status: DC

## 2016-10-08 NOTE — Telephone Encounter (Signed)
Started late July. Initially immodium worked but starting last week it became worse. Having shaky feeling with lomotil C diff is not back. Negative stool pathogen panel incl giardia. Will start metronidazole 500 tid tonight---if worse she needs to go to ED. Stop lomotil. I will check lab results again tomorrow AM looking for results C Diff. I will call her tomorrow afternoon.

## 2016-10-09 ENCOUNTER — Telehealth: Payer: Self-pay | Admitting: Internal Medicine

## 2016-10-09 ENCOUNTER — Ambulatory Visit
Admission: RE | Admit: 2016-10-09 | Discharge: 2016-10-09 | Disposition: A | Discharge status: Home / Self Care | Payer: BLUE CROSS/BLUE SHIELD | Source: Ambulatory Visit | Attending: Otolaryngology | Admitting: Otolaryngology

## 2016-10-09 ENCOUNTER — Telehealth: Payer: Self-pay | Admitting: Family Medicine

## 2016-10-09 DIAGNOSIS — H903 Sensorineural hearing loss, bilateral: Secondary | ICD-10-CM

## 2016-10-09 LAB — GI PROFILE, STOOL, PCR
ADENOVIRUS F 40/41: NOT DETECTED
ASTROVIRUS: NOT DETECTED
C difficile toxin A/B: NOT DETECTED
CRYPTOSPORIDIUM: NOT DETECTED
CYCLOSPORA CAYETANENSIS: NOT DETECTED
Campylobacter: NOT DETECTED
ENTAMOEBA HISTOLYTICA: NOT DETECTED
Enteroaggregative E coli: NOT DETECTED
Enteropathogenic E coli: NOT DETECTED
Enterotoxigenic E coli: NOT DETECTED
GIARDIA LAMBLIA: NOT DETECTED
Norovirus GI/GII: NOT DETECTED
PLESIOMONAS SHIGELLOIDES: NOT DETECTED
Rotavirus A: NOT DETECTED
SAPOVIRUS: NOT DETECTED
SHIGA-TOXIN-PRODUCING E COLI: NOT DETECTED
Salmonella: NOT DETECTED
Shigella/Enteroinvasive E coli: NOT DETECTED
Vibrio cholerae: NOT DETECTED
Vibrio: NOT DETECTED
Yersinia enterocolitica: NOT DETECTED

## 2016-10-09 LAB — STOOL CULTURE: E COLI SHIGA TOXIN ASSAY: NEGATIVE

## 2016-10-09 MED ORDER — GADOBENATE DIMEGLUMINE 529 MG/ML IV SOLN
20.0000 mL | Freq: Once | INTRAVENOUS | Status: AC | PRN
  Administered 2016-10-09: 19 mL via INTRAVENOUS

## 2016-10-09 NOTE — Telephone Encounter (Signed)
[phone] Jerilynn Mages) [phone] (H) She has appt w Dr. Serita Grit this afternoon at 2:30 [facility]

## 2016-10-09 NOTE — Telephone Encounter (Signed)
Called patient to discuss her lab results. GI profile negative including negative C diff toxin. FOBT was positive. May be related to irritation from significant amounts of diarrhea. Have recommended that patient call her GI doctor (Buccini) for a visit. Would have been due for a colonoscopy in less than a year. Lomotil was discontinued yesterday by PCP due to side effects of sweatiness/shakiness. Patient still having diarrhea.   Phill Myron, D.O. 10/09/2016, 9:05 AM PGY-3, [place]

## 2016-10-14 LAB — OVA AND PARASITE EXAMINATION

## 2016-10-15 ENCOUNTER — Telehealth: Payer: Self-pay | Admitting: *Deleted

## 2016-10-15 ENCOUNTER — Encounter: Payer: Self-pay | Admitting: Family Medicine

## 2016-10-15 NOTE — Telephone Encounter (Signed)
LVM for pt to call back to inform her of below. Zimmerman Rumple, April D, CMA  

## 2016-10-15 NOTE — Telephone Encounter (Signed)
-----   Message from Nicolette Bang, DO sent at 10/15/2016  9:31 AM EDT ----- Please call patient to let her know that no parasites were seen on her final stool test.   Phill Myron, D.O. 10/15/2016, 9:31 AM PGY-3, [place]

## 2016-10-23 ENCOUNTER — Encounter: Payer: Self-pay | Admitting: Family Medicine

## 2016-10-23 ENCOUNTER — Ambulatory Visit (INDEPENDENT_AMBULATORY_CARE_PROVIDER_SITE_OTHER): Payer: BLUE CROSS/BLUE SHIELD | Admitting: Family Medicine

## 2016-10-23 VITALS — BP 94/62 | HR 83 | Temp 97.5°F | Ht 59.0 in | Wt 201.8 lb

## 2016-10-23 DIAGNOSIS — E559 Vitamin D deficiency, unspecified: Secondary | ICD-10-CM | POA: Diagnosis not present

## 2016-10-23 DIAGNOSIS — I10 Essential (primary) hypertension: Secondary | ICD-10-CM

## 2016-10-23 DIAGNOSIS — E538 Deficiency of other specified B group vitamins: Secondary | ICD-10-CM | POA: Diagnosis not present

## 2016-10-23 DIAGNOSIS — Z23 Encounter for immunization: Secondary | ICD-10-CM

## 2016-10-23 NOTE — Patient Instructions (Signed)
Stop or decrease your benazepril and let me know the BP numbers I will send you a note about your labs See what happens with the sweats Use the support stockings as needed and let us revisit when I see you back

## 2016-10-24 LAB — VITAMIN B12: VITAMIN B 12: 805 pg/mL (ref 232–1245)

## 2016-10-24 LAB — VITAMIN D 25 HYDROXY (VIT D DEFICIENCY, FRACTURES): VIT D 25 HYDROXY: 26.9 ng/mL — AB (ref 30.0–100.0)

## 2016-10-24 LAB — LIPID PANEL
CHOLESTEROL TOTAL: 173 mg/dL (ref 100–199)
Chol/HDL Ratio: 3.5 ratio (ref 0.0–4.4)
HDL: 50 mg/dL (ref >39)
LDL CALC: 90 mg/dL (ref 0–99)
TRIGLYCERIDES: 164 mg/dL — AB (ref 0–149)
VLDL Cholesterol Cal: 33 mg/dL (ref 5–40)

## 2016-10-25 NOTE — Progress Notes (Signed)
    CHIEF COMPLAINT / HPI:   #1. Diarrhea: Was seen by her gastroenterologist and diagnosed with microscopic colitis. Her diarrhea has improved slightly.  #2. Depression/anxiety. Feels like she's been a little more anxious recently but thinks that may be related to her episodes of diarrhea is that has traditionally been a trigger for her. #3. Hypertension: Needs refills. No shortness of breath. No chest pain. Exercise tolerance is baseline. No headaches.   REVIEW OF SYSTEMS:  Denies fever. She has had some weight loss associated with her diarrhea. Her appetite has been down but is slightly improving.  OBJECTIVE:  Vital signs are reviewed.   GEN.: Well-developed female no acute distress CV: Regular rate and rhythm LUNGS: Clear to auscultation bilaterally ABDOMEN: Soft, positive bowel sounds nontender nondistended EXTREMITY: No edema. Movement of extremities 4. NECK: No lymphadenopathy no thyromegaly no carotid bruits.  ASSESSMENT / PLAN: #1. Diarrhea seems to be slightly improving. Continue follow-up with GI #2. Depression anxiety slightly worse but likely related to gastrointestinal problems. Refill her medicines. Follow-up 3 or 4 weeks if she's not improving back to her baseline or she has new or worsening symptoms. #3. Hypertension: Continue current medications. Reviewed her recent lab work and her kidney function looks fine.

## 2016-11-01 ENCOUNTER — Other Ambulatory Visit: Payer: Self-pay | Admitting: Family Medicine

## 2016-11-04 ENCOUNTER — Encounter: Payer: Self-pay | Admitting: Family Medicine

## 2016-11-04 NOTE — Telephone Encounter (Signed)
Tried to call in RX on hold for over 9 minutes will try again tomorrow, 11/05/16. Katharina Caper, April D, [place]

## 2016-11-04 NOTE — Telephone Encounter (Signed)
Dear [name] Team Can you please call in her alprazolam with 5 refills as below [facility]! [name]

## 2016-11-06 NOTE — Telephone Encounter (Signed)
Rx called in. Ottis Stain, CMA

## 2016-11-11 ENCOUNTER — Encounter: Payer: Self-pay | Admitting: Family Medicine

## 2016-11-19 ENCOUNTER — Encounter: Payer: Self-pay | Admitting: Family Medicine

## 2016-11-21 ENCOUNTER — Other Ambulatory Visit: Payer: Self-pay | Admitting: Family Medicine

## 2016-11-21 DIAGNOSIS — Z1231 Encounter for screening mammogram for malignant neoplasm of breast: Secondary | ICD-10-CM

## 2016-12-03 ENCOUNTER — Inpatient Hospital Stay (HOSPITAL_COMMUNITY)
Admission: EM | Admit: 2016-12-03 | Discharge: 2016-12-05 | DRG: 176 | Disposition: A | Discharge status: Home / Self Care | Payer: BLUE CROSS/BLUE SHIELD | Attending: Family Medicine | Admitting: Family Medicine

## 2016-12-03 ENCOUNTER — Emergency Department (HOSPITAL_COMMUNITY): Payer: BLUE CROSS/BLUE SHIELD

## 2016-12-03 ENCOUNTER — Encounter (HOSPITAL_COMMUNITY): Payer: Self-pay | Admitting: Emergency Medicine

## 2016-12-03 DIAGNOSIS — E785 Hyperlipidemia, unspecified: Secondary | ICD-10-CM | POA: Diagnosis present

## 2016-12-03 DIAGNOSIS — K52831 Collagenous colitis: Secondary | ICD-10-CM | POA: Diagnosis present

## 2016-12-03 DIAGNOSIS — I776 Arteritis, unspecified: Secondary | ICD-10-CM | POA: Diagnosis present

## 2016-12-03 DIAGNOSIS — Z96652 Presence of left artificial knee joint: Secondary | ICD-10-CM | POA: Diagnosis present

## 2016-12-03 DIAGNOSIS — K529 Noninfective gastroenteritis and colitis, unspecified: Secondary | ICD-10-CM | POA: Diagnosis present

## 2016-12-03 DIAGNOSIS — I119 Hypertensive heart disease without heart failure: Secondary | ICD-10-CM | POA: Diagnosis present

## 2016-12-03 DIAGNOSIS — E669 Obesity, unspecified: Secondary | ICD-10-CM | POA: Diagnosis present

## 2016-12-03 DIAGNOSIS — Z880 Allergy status to penicillin: Secondary | ICD-10-CM

## 2016-12-03 DIAGNOSIS — J302 Other seasonal allergic rhinitis: Secondary | ICD-10-CM | POA: Diagnosis present

## 2016-12-03 DIAGNOSIS — I7300 Raynaud's syndrome without gangrene: Secondary | ICD-10-CM | POA: Diagnosis present

## 2016-12-03 DIAGNOSIS — Z888 Allergy status to other drugs, medicaments and biological substances status: Secondary | ICD-10-CM

## 2016-12-03 DIAGNOSIS — K449 Diaphragmatic hernia without obstruction or gangrene: Secondary | ICD-10-CM | POA: Diagnosis present

## 2016-12-03 DIAGNOSIS — E559 Vitamin D deficiency, unspecified: Secondary | ICD-10-CM | POA: Diagnosis present

## 2016-12-03 DIAGNOSIS — I10 Essential (primary) hypertension: Secondary | ICD-10-CM | POA: Diagnosis present

## 2016-12-03 DIAGNOSIS — I2699 Other pulmonary embolism without acute cor pulmonale: Principal | ICD-10-CM | POA: Diagnosis present

## 2016-12-03 DIAGNOSIS — Z96611 Presence of right artificial shoulder joint: Secondary | ICD-10-CM | POA: Diagnosis present

## 2016-12-03 DIAGNOSIS — F411 Generalized anxiety disorder: Secondary | ICD-10-CM

## 2016-12-03 DIAGNOSIS — I5030 Unspecified diastolic (congestive) heart failure: Secondary | ICD-10-CM | POA: Diagnosis not present

## 2016-12-03 DIAGNOSIS — M069 Rheumatoid arthritis, unspecified: Secondary | ICD-10-CM | POA: Diagnosis present

## 2016-12-03 DIAGNOSIS — K589 Irritable bowel syndrome without diarrhea: Secondary | ICD-10-CM | POA: Diagnosis present

## 2016-12-03 DIAGNOSIS — Z6837 Body mass index (BMI) 37.0-37.9, adult: Secondary | ICD-10-CM

## 2016-12-03 DIAGNOSIS — F410 Panic disorder [episodic paroxysmal anxiety] without agoraphobia: Secondary | ICD-10-CM | POA: Diagnosis present

## 2016-12-03 DIAGNOSIS — Z79899 Other long term (current) drug therapy: Secondary | ICD-10-CM

## 2016-12-03 DIAGNOSIS — Z9884 Bariatric surgery status: Secondary | ICD-10-CM

## 2016-12-03 DIAGNOSIS — R001 Bradycardia, unspecified: Secondary | ICD-10-CM | POA: Diagnosis not present

## 2016-12-03 DIAGNOSIS — K219 Gastro-esophageal reflux disease without esophagitis: Secondary | ICD-10-CM | POA: Diagnosis present

## 2016-12-03 DIAGNOSIS — I371 Nonrheumatic pulmonary valve insufficiency: Secondary | ICD-10-CM | POA: Diagnosis not present

## 2016-12-03 HISTORY — DX: Other pulmonary embolism without acute cor pulmonale: I26.99

## 2016-12-03 LAB — TROPONIN I

## 2016-12-03 LAB — HEPARIN LEVEL (UNFRACTIONATED): HEPARIN UNFRACTIONATED: 0.79 [IU]/mL — AB (ref 0.30–0.70)

## 2016-12-03 LAB — BASIC METABOLIC PANEL
Anion gap: 8 (ref 5–15)
BUN: 17 mg/dL (ref 6–20)
CHLORIDE: 100 mmol/L — AB (ref 101–111)
CO2: 26 mmol/L (ref 22–32)
CREATININE: 0.71 mg/dL (ref 0.44–1.00)
Calcium: 8.9 mg/dL (ref 8.9–10.3)
GFR calc Af Amer: >60 mL/min (ref >60)
GFR calc non Af Amer: >60 mL/min (ref >60)
GLUCOSE: 126 mg/dL — AB (ref 65–99)
Potassium: 4 mmol/L (ref 3.5–5.1)
SODIUM: 134 mmol/L — AB (ref 135–145)

## 2016-12-03 LAB — CBC
HEMATOCRIT: 37 % (ref 36.0–46.0)
Hemoglobin: 11.9 g/dL — ABNORMAL LOW (ref 12.0–15.0)
MCH: 27.4 pg (ref 26.0–34.0)
MCHC: 32.2 g/dL (ref 30.0–36.0)
MCV: 85.1 fL (ref 78.0–100.0)
PLATELETS: 289 10*3/uL (ref 150–400)
RBC: 4.35 MIL/uL (ref 3.87–5.11)
RDW: 13.9 % (ref 11.5–15.5)
WBC: 10.6 10*3/uL — AB (ref 4.0–10.5)

## 2016-12-03 LAB — I-STAT TROPONIN, ED
TROPONIN I, POC: 0 ng/mL (ref 0.00–0.08)
Troponin i, poc: 0 ng/mL (ref 0.00–0.08)

## 2016-12-03 LAB — MRSA PCR SCREENING: MRSA by PCR: POSITIVE — AB

## 2016-12-03 LAB — HIV ANTIBODY (ROUTINE TESTING W REFLEX): HIV SCREEN 4TH GENERATION: NONREACTIVE

## 2016-12-03 MED ORDER — IOPAMIDOL (ISOVUE-370) INJECTION 76%
INTRAVENOUS | Status: AC
  Administered 2016-12-03: 100 mL
  Filled 2016-12-03: qty 100

## 2016-12-03 MED ORDER — HEPARIN (PORCINE) IN NACL 100-0.45 UNIT/ML-% IJ SOLN
950.0000 [IU]/h | INTRAMUSCULAR | Status: DC
  Administered 2016-12-03: 1100 [IU]/h via INTRAVENOUS
  Administered 2016-12-04: 950 [IU]/h via INTRAVENOUS
  Filled 2016-12-03 (×2): qty 250

## 2016-12-03 MED ORDER — PREDNISONE 5 MG PO TABS: 5.0000 mg | ORAL_TABLET | ORAL | Status: DC

## 2016-12-03 MED ORDER — PREDNISONE 5 MG PO TABS
5.0000 mg | ORAL_TABLET | Freq: Every day | ORAL | Status: DC
  Administered 2016-12-05: 5 mg via ORAL
  Filled 2016-12-03 (×2): qty 1

## 2016-12-03 MED ORDER — LORATADINE 10 MG PO TABS
10.0000 mg | ORAL_TABLET | Freq: Every day | ORAL | Status: DC
Start: 2016-12-03 — End: 2016-12-05
  Administered 2016-12-03 – 2016-12-05 (×3): 10 mg via ORAL
  Filled 2016-12-03 (×3): qty 1

## 2016-12-03 MED ORDER — PREDNISOLONE 5 MG PO TABS: 5.0000 mg | ORAL_TABLET | Freq: Every day | ORAL | Status: DC

## 2016-12-03 MED ORDER — VENLAFAXINE HCL ER 150 MG PO CP24
150.0000 mg | ORAL_CAPSULE | Freq: Every day | ORAL | Status: DC
  Administered 2016-12-03: 150 mg via ORAL
  Filled 2016-12-03: qty 1

## 2016-12-03 MED ORDER — PREDNISOLONE 5 MG PO TABS
10.0000 mg | ORAL_TABLET | Freq: Every day | ORAL | Status: DC
  Administered 2016-12-03: 10 mg via ORAL
  Filled 2016-12-03: qty 2

## 2016-12-03 MED ORDER — ONDANSETRON HCL 4 MG/2ML IJ SOLN
4.0000 mg | Freq: Once | INTRAMUSCULAR | Status: AC
  Administered 2016-12-03: 4 mg via INTRAVENOUS
  Filled 2016-12-03: qty 2

## 2016-12-03 MED ORDER — HEPARIN BOLUS VIA INFUSION
4000.0000 [IU] | Freq: Once | INTRAVENOUS | Status: AC
  Administered 2016-12-03: 4000 [IU] via INTRAVENOUS
  Filled 2016-12-03: qty 4000

## 2016-12-03 MED ORDER — PANTOPRAZOLE SODIUM 40 MG PO TBEC
40.0000 mg | DELAYED_RELEASE_TABLET | Freq: Every day | ORAL | Status: DC
  Administered 2016-12-03 – 2016-12-05 (×3): 40 mg via ORAL
  Filled 2016-12-03 (×3): qty 1

## 2016-12-03 MED ORDER — MORPHINE SULFATE (PF) 4 MG/ML IV SOLN
4.0000 mg | Freq: Once | INTRAVENOUS | Status: AC
  Administered 2016-12-03: 4 mg via INTRAVENOUS
  Filled 2016-12-03: qty 1

## 2016-12-03 MED ORDER — MUPIROCIN 2 % EX OINT
1.0000 "application " | TOPICAL_OINTMENT | Freq: Two times a day (BID) | CUTANEOUS | Status: DC
  Administered 2016-12-03 – 2016-12-05 (×4): 1 via NASAL
  Filled 2016-12-03: qty 22

## 2016-12-03 MED ORDER — BUDESONIDE 3 MG PO CPEP
9.0000 mg | ORAL_CAPSULE | Freq: Every day | ORAL | Status: DC
  Administered 2016-12-03 – 2016-12-05 (×3): 9 mg via ORAL
  Filled 2016-12-03 (×3): qty 3

## 2016-12-03 MED ORDER — VITAMIN B-12 1000 MCG PO TABS
1000.0000 ug | ORAL_TABLET | Freq: Every day | ORAL | Status: DC
  Administered 2016-12-03 – 2016-12-05 (×3): 1000 ug via ORAL
  Filled 2016-12-03 (×3): qty 1

## 2016-12-03 MED ORDER — PREDNISONE 10 MG PO TABS
10.0000 mg | ORAL_TABLET | Freq: Every day | ORAL | Status: AC
  Administered 2016-12-04: 10 mg via ORAL
  Filled 2016-12-03: qty 1

## 2016-12-03 MED ORDER — VENLAFAXINE HCL ER 75 MG PO CP24: 75.0000 mg | ORAL_CAPSULE | Freq: Every day | ORAL | Status: DC

## 2016-12-03 MED ORDER — VITAMIN D 1000 UNITS PO TABS
1000.0000 [IU] | ORAL_TABLET | Freq: Every day | ORAL | Status: DC
  Administered 2016-12-03 – 2016-12-05 (×3): 1000 [IU] via ORAL
  Filled 2016-12-03 (×3): qty 1

## 2016-12-03 MED ORDER — CHLORHEXIDINE GLUCONATE CLOTH 2 % EX PADS
6.0000 | MEDICATED_PAD | Freq: Every day | CUTANEOUS | Status: DC
  Administered 2016-12-04 – 2016-12-05 (×2): 6 via TOPICAL

## 2016-12-03 MED ORDER — ALPRAZOLAM 0.5 MG PO TABS
1.0000 mg | ORAL_TABLET | Freq: Every evening | ORAL | Status: DC | PRN
  Administered 2016-12-03 – 2016-12-04 (×2): 1 mg via ORAL
  Filled 2016-12-03 (×2): qty 2

## 2016-12-03 MED ORDER — FLUTICASONE PROPIONATE 50 MCG/ACT NA SUSP
2.0000 | Freq: Every day | NASAL | Status: DC | PRN
  Filled 2016-12-03: qty 16

## 2016-12-03 NOTE — ED Provider Notes (Signed)
[facility] EMERGENCY DEPARTMENT Provider Note   CSN: [id number] Arrival date & time: 12/03/16  0114     History   Chief Complaint Chief Complaint  Patient presents with  . Chest Pain    HPI [name] is a 66 y.o. female.  Patient presents to the emergency department with chief complaint of chest pain. She states chest pain awakening her from sleep tonight. She states she initially thought it was asked reflux. She states the pain radiated to her neck. She reports associated shortness of breath. She denies any diaphoresis. She states that she was recently diagnosed with vasculitis in her feet and is concerned about blood clot in her lung. She tried taking a nitroglycerin with no relief.She denies any fevers, chills, or cough. Denies any other associated symptoms.   The history is provided by the patient. No language interpreter was used.    Past Medical History:  Diagnosis Date  . Anemia   . Anxiety   . Arthritis   . Depression   . Dysrhythmia    occ due to caffeine intake  . Encounter for long-term (current) use of other medications   . GERD (gastroesophageal reflux disease)   . Hepatitis   . History of hiatal hernia   . Hypertension   . IBS (irritable bowel syndrome)   . Obesity   . Osteoarthritis   . Pain in joint, multiple sites   . Pneumonia   . PONV (postoperative nausea and vomiting)    in the 1980's no problems since  . Psoriatic arthropathy ([place])   . Raynaud disease     Patient Active Problem List   Diagnosis Date Noted  . Pulmonary embolism ([place]) 12/03/2016  . S/P shoulder replacement, right 06/28/2016  . Vitamin D deficiency 12/05/2015  . Vitamin B 12 deficiency 12/05/2015  . H/O TB skin testing 12/05/2015  . AC (acromioclavicular) joint arthritis 08/29/2015  . Otitis media, acute suppurative 11/25/2014  . Well adult health check 09/01/2013  . Hammertoe 11/20/2012  . Psoriatic arthritis ([place]) 04/10/2012  . Pulmonary  nodule seen on imaging study 11/08/2011  . Dysthymia 12/12/2010  . Pain in joint, shoulder region 10/03/2008  . Essential hypertension, benign 04/27/2008  . Palpitations 10/30/2007  . METATARSALGIA 10/02/2006  . KNEE REPLACEMENT, LEFT, HX OF 07/03/2006  . OBESITY, NOS 04/17/2006  . PANIC ATTACKS 04/17/2006  . GASTROESOPHAGEAL REFLUX, NO ESOPHAGITIS 04/17/2006  . CHOLELITHIASIS, NOS 04/17/2006  . VAGINITIS, ATROPHIC 04/17/2006  . ARTHRALGIA, UNSPECIFIED 04/17/2006    Past Surgical History:  Procedure Laterality Date  . ABDOMINAL HYSTERECTOMY    . CHOLECYSTECTOMY    . COLONOSCOPY    . GASTRIC BYPASS    . HALLUX VALGUS CORRECTION  09/2010   Dr Arnetha Courser; right 1st ray  . HERNIA REPAIR    . JOINT REPLACEMENT     left knee times 3  . SHOULDER HEMI-ARTHROPLASTY Right 06/28/2016   Procedure: RIGHT SHOULDER HEMI-ARTHROPLASTY;  Surgeon: Netta Cedars, MD;  Location: [facility];  Service: Orthopedics;  Laterality: Right;  . TUBAL LIGATION      OB History    No data available       Home Medications    Prior to Admission medications   Medication Sig Start Date End Date Taking? Authorizing Provider  acetaminophen (TYLENOL) 500 MG tablet Take 1,000 mg by mouth every 6 (six) hours as needed for mild pain.    [provider]  ALPRAZolam (XANAX) 0.5 MG tablet TAKE 1 TABLET BY MOUTH THREE  TIMES DAILY AS NEEDED 11/04/16   [name], MD  benazepril (LOTENSIN) 20 MG tablet Take 1 tablet (20 mg total) by mouth daily. 02/16/16   [name], MD  cetirizine (ZYRTEC) 10 MG tablet Take 10 mg by mouth daily.    [provider]  Cholecalciferol (VITAMIN D3) 1000 units CAPS Take 1,000 Units by mouth daily.    [provider]  diclofenac (VOLTAREN) 75 MG EC tablet TAKE 1 TABLET BY MOUTH 2 TIMES DAILY AS NEEDED FOR JOINT PAIN Patient taking differently: Take 75 mg by mouth 2 (two) times daily.  02/16/16   [name], MD  diphenoxylate-atropine (LOMOTIL) 2.5-0.025 MG tablet  Take 2 tablets by mouth 4 (four) times daily as needed for diarrhea or loose stools. 10/02/16   Nicolette Bang, DO  esomeprazole (NEXIUM) 20 MG capsule Take 1 capsule (20 mg total) by mouth daily. 02/16/16   [name], MD  fluconazole (DIFLUCAN) 100 MG tablet TAKE 1 TABLET BY MOUTH EVERY OTHER DAYS FOR 7 DOSES, THEN TAKE 1 TABLET ONCE WEEKLY THEREAFTER 01/26/16   [name], MD  fluticasone [facility]) 50 MCG/ACT nasal spray Place 2 sprays into both nostrils daily as needed for allergies. 05/27/16   [provider]  furosemide (LASIX) 20 MG tablet TAKE 1 OR 2 TABLETS BY MOUTH DAILY FOR LOWER EXTREMITY SWELLING Patient taking differently: Take 20-40 mg by mouth daily. TAKE 1 OR 2 TABLETS BY MOUTH DAILY FOR LOWER EXTREMITY SWELLING  DEPENDS ON AMOUNT OF SWELLING IF TAKES 1 OR 2 TABLETS 02/16/16   [name], MD  ibuprofen (ADVIL,MOTRIN) 200 MG tablet Take 400 mg by mouth every 6 (six) hours as needed for mild pain.    [provider]  metroNIDAZOLE (FLAGYL) 500 MG tablet Take 1 tablet (500 mg total) by mouth 3 (three) times daily. 10/08/16   [name], MD  Multiple Vitamins-Minerals (CENTRUM SILVER PO) Take 1 tablet by mouth daily.    [provider]  oxyCODONE-acetaminophen (ROXICET) 5-325 MG tablet Take 1-2 tablets by mouth every 4 (four) hours as needed for severe pain. 06/28/16   Netta Cedars, MD  valACYclovir (VALTREX) 500 MG tablet Take 1,000 mg by mouth daily as needed (outbreaks). For 3 days as needed 05/27/16   [provider]  venlafaxine XR (EFFEXOR-XR) 150 MG 24 hr capsule Take 1 capsule (150 mg total) by mouth daily. Patient taking differently: Take 150 mg by mouth at bedtime. Take with venlafaxine xr 75 to equal 225 mg 02/16/16   [name], MD  venlafaxine XR (EFFEXOR-XR) 75 MG 24 hr capsule TAKE 1 CAPSULE BY MOUTH ONCE DAILY (IN ADDITION TO 150MG  CAPSULE FOR MAX OF 225MG  PER DAY) Patient taking differently: Take 75 mg by mouth at bedtime.  Take with Venlafaxine 150MG  to equal 225 mg 03/05/16   [name], MD  vitamin B-12 (CYANOCOBALAMIN) 1000 MCG tablet Take 1,000 mcg by mouth daily.    [provider]    Family History Family History  Problem Relation Age of Onset  . Depression Mother   . Deep vein thrombosis Father     Social History Social History  Substance Use Topics  . Smoking status: Never Smoker  . Smokeless tobacco: Never Used  . Alcohol use No     Allergies   Vioxx [rofecoxib]; Augmentin [amoxicillin-pot clavulanate]; Bactrim [sulfamethoxazole-trimethoprim]; Banana; Cephalosporins; Ciprofloxacin; Coconut flavor; Codeine; Keflex [cephalexin]; and Zithromax [azithromycin]   Review of Systems Review of Systems  All  other systems reviewed and are negative.    Physical Exam Updated Vital Signs BP 125/71 (BP Location: Right Arm)   Pulse 69   Temp 98.3 F (36.8 C) (Oral)   Resp 16   Ht 4\' 11"  (1.499 m)   Wt 85.3 kg (188 lb)   SpO2 99%   BMI 37.97 kg/m   Physical Exam  Constitutional: She is oriented to person, place, and time. She appears well-developed and well-nourished.  HENT:  Head: Normocephalic and atraumatic.  Eyes: Pupils are equal, round, and reactive to light. Conjunctivae and EOM are normal.  Neck: Normal range of motion. Neck supple.  Cardiovascular: Normal rate and regular rhythm.  Exam reveals no gallop and no friction rub.   No murmur heard. Pulmonary/Chest: Effort normal and breath sounds normal. No respiratory distress. She has no wheezes. She has no rales. She exhibits no tenderness.  Abdominal: Soft. Bowel sounds are normal. She exhibits no distension and no mass. There is no tenderness. There is no rebound and no guarding.  Musculoskeletal: Normal range of motion. She exhibits no edema or tenderness.  Neurological: She is alert and oriented to person, place, and time.  Skin: Skin is warm and dry.  Psychiatric: She has a normal mood and affect. Her behavior is  normal. Judgment and thought content normal.  Nursing note and vitals reviewed.    ED Treatments / Results  Labs (all labs ordered are listed, but only abnormal results are displayed) Labs Reviewed  BASIC METABOLIC PANEL - Abnormal; Notable for the following:       Result Value   Sodium 134 (*)    Chloride 100 (*)    Glucose, Bld 126 (*)    All other components within normal limits  CBC - Abnormal; Notable for the following:    WBC 10.6 (*)    Hemoglobin 11.9 (*)    All other components within normal limits  HEPARIN LEVEL (UNFRACTIONATED)  I-STAT TROPONIN, ED  I-STAT TROPONIN, ED    EKG  EKG Interpretation None       Radiology Dg Chest 2 View  Result Date: 12/03/2016 CLINICAL DATA:  Central chest pain. History of gastric bypass. Vomiting tonight. EXAM: CHEST  2 VIEW COMPARISON:  04/02/2016 FINDINGS: Shallow inspiration. Normal heart size and pulmonary vascularity. No focal airspace disease or consolidation in the lungs. No blunting of costophrenic angles. No pneumothorax. Mediastinal contours appear intact. Degenerative changes in the spine. Postoperative changes in the right shoulder and left upper quadrant. IMPRESSION: No active cardiopulmonary disease. Electronically Signed   By: Lucienne Capers M.D.   On: 12/03/2016 02:16   Ct Angio Chest Pe W And/or Wo Contrast  Result Date: 12/03/2016 CLINICAL DATA:  Chest pain woke the patient up last night. Pain radiates to the back and the jaw. EXAM: CT ANGIOGRAPHY CHEST WITH CONTRAST TECHNIQUE: Multidetector CT imaging of the chest was performed using the standard protocol during bolus administration of intravenous contrast. Multiplanar CT image reconstructions and MIPs were obtained to evaluate the vascular anatomy. CONTRAST:  55 mL Isovue 370 COMPARISON:  04/02/2016 FINDINGS: Cardiovascular: Filling defects are demonstrated in left lower lobe subsegmental pulmonary arteries consistent with pulmonary embolus. RA acute RV ratio  measures 1.294. This may indicate changes of right heart strain, however, the clot burden does not seem to be very severe. Mild cardiac enlargement. No pericardial effusion. Mediastinum/Nodes: No significant lymphadenopathy in the chest. Esophagus is decompressed. Small esophageal hiatal hernia. Lungs/Pleura: Linear atelectasis in the lung bases. No consolidation or airspace  disease. No pleural effusions. No pneumothorax. Airways are patent. Scattered right pulmonary nodules. Largest is in the right middle lobe and measures about 5.5 mm diameter. No change since previous study. Upper Abdomen: Postoperative changes likely representing gastric bypass. Postoperative cholecystectomy. Musculoskeletal: Mild degenerative changes in the spine. No destructive bone lesions. Review of the MIP images confirms the above findings. IMPRESSION: Positive for acute PE with CT evidence of right heart strain (RV/LV Ratio = 1.294) consistent with at least submassive (intermediate risk) PE. The presence of right heart strain has been associated with an increased risk of morbidity and mortality. Please activate Code PE by paging [phone]. Unchanged appearance of right pulmonary nodules. These results were called by telephone at the time of interpretation on 12/03/2016 at 6:16 am to PA. Montine Circle , who verbally acknowledged these results. Electronically Signed   By: Lucienne Capers M.D.   On: 12/03/2016 06:19    Procedures Procedures (including critical care time) CRITICAL CARE Performed by: Montine Circle   Total critical care time: 44 minutes  Critical care time was exclusive of separately billable procedures and treating other patients.  Critical care was necessary to treat or prevent imminent or life-threatening deterioration.  Critical care was time spent personally by me on the following activities: development of treatment plan with patient and/or surrogate as well as nursing, discussions with  consultants, evaluation of patient's response to treatment, examination of patient, obtaining history from patient or surrogate, ordering and performing treatments and interventions, ordering and review of laboratory studies, ordering and review of radiographic studies, pulse oximetry and re-evaluation of patient's condition.  Medications Ordered in ED Medications  heparin bolus via infusion 4,000 Units (not administered)  heparin ADULT infusion 100 units/mL (25000 units/264mL sodium chloride 0.45%) (not administered)  morphine 4 MG/ML injection 4 mg (4 mg Intravenous Given 12/03/16 0539)  ondansetron (ZOFRAN) injection 4 mg (4 mg Intravenous Given 12/03/16 0537)  iopamidol (ISOVUE-370) 76 % injection (100 mLs  Contrast Given 12/03/16 0546)     Initial Impression / Assessment and Plan / ED Course  I have reviewed the triage vital signs and the nursing notes.  Pertinent labs & imaging results that were available during my care of the patient were reviewed by me and considered in my medical decision making (see chart for details).     Patient with chest pain that started tonight. Associated shortness of breath. Recently diagnosed with vasculitis in her feet with reported small blood clots. She is concerned about PE. Given that she is high risk, will proceed with CT angiogram. CT PE study is positive. Patient is not hypoxic. Her blood pressure is stable. We'll start her on heparin. Patient is stable. Will consult family medicine for admission.  Final Clinical Impressions(s) / ED Diagnoses   Final diagnoses:  PE (pulmonary thromboembolism) [facility])    New Prescriptions New Prescriptions   No medications on file     Montine Circle, Hershal Coria 12/03/16 1914    Merryl Hacker, MD 12/03/16 [phone]

## 2016-12-03 NOTE — ED Notes (Signed)
Pt up to bedside comode w/ one stand by assistance.

## 2016-12-03 NOTE — H&P (Signed)
[facility] Admission History and Physical Service Pager: [phone]  Patient name: [name] Medical record number: [id number] Date of birth: 1950-09-26 Age: 66 y.o. Gender: female  Primary Care Provider: [name], MD Consultants:  Code Status: Full   Chief Complaint: Chest pain/ Submassive PE   Assessment and Plan: [name] is a 66 y.o. female presenting with chest pain found to have a submassive PE. PMH is significant for rheumatoid arthritis, vasculitis, collagense colitis, HLD, HTN, GERD  Chest Pain likely 2/2 Acute Submassive PE: Patient presenting with chest pain,sudden onset with findings of submassive PE on CTA. She has a history of vasculitis which could be contributing to clot presentation. CTA was notable for an acute PE with right heart strain; RV/LV ratio of 1.294. EKG with out ST/T wave changes, similar to previous EKGs. Patient's vitals have been stable, she has not been hypotensive. Patient is sating 95% on room air, no respiratory distress, RR 13. Initial troponin 2 have been 0.0.  - Admit to Stepdown, attending Dr. Andria Frames - Continous pulse ox and cardiac monitoring - CCM contacted, patient discussed - if vitals changes - oxygen requirement, up trending troponin, hypotension-  will call CCM back to consider TPA - trend troponin 3 - Heparin drip for now, when stable consider transition to warfarin given hx of vasculitis  - Obtain ECHO  - AM EKG  - Consider consult to hematology for clotting disorder labs  - Continue to watch vitals closely over the next 24 hours  - CBC and BMET tomorrow  - Pain control with tylenol, if patient needs further pain control consider fentanyl in order not to low blood pressure   Collagenous colitis: recently diagnosed with via biopsy following a hx of diarrhea for several months. Currently well controlled on budesonide  - Continue budesonide   Vasculitis Unspecified: Diagnosis via dermatology  about 2 months ago, petechial rash noted on the bottom of bilateral feet. Patient on prednisone for this issue. Currently on a taper  - Continue prednisone 5 mg daily   Rheumatoid Arthritis: Patient previously on Remicad. Has been stopped since diagnosis of vasculitis  - Currently on two steroids  - Holding diclofenac for now   HTN: Currently Normotensive  - Holding Benapril  - Watch blood pressure closely for decompensation   Seasonal Allergies  - Continue flonase and claritin   GAD: Home venlafaxine and xanax (takes 1 mg at night) - Continue home medication   FEN/GI: Protonix  Prophylaxis: Heparin drip for PE   Disposition: Home once stable   History of Present Illness:  [name] is a 67 y.o. female presenting with chest pain. Previously patient was in her normal state of health. Patient woke at 12 last night with squeezing left sided chest pain, radiating through back and jaw. Patient took a couple of antiacids as she has a hx of reflux. Chest pain continued following Tums. Patient was then concerned it could be her heart. Therefore, she took ene of her husband's nitroglycerin and that did not help at all. She woke up her husband and they came to the ED.  Patient recently diagnosised with vasculitis in September by Dermatology, patient was noted in vascular clots noted on pathology. She states the dermatologist was initial concerned about patient having a DVT or PE and asked about symptoms, however patient had none. No hx of previous blood clots, thought patient states her father died from a blood clot. Patient was placed on heparin drip  in the ED, vitals have been stable. Denies any dyspnea. Has had a mild cough for about 1 month, this seems allergic to her. Denies any swelling in her legs.   Review Of Systems: Per HPI with the following additions:   Review of Systems  Constitutional: Negative for chills and fever.  HENT: Negative for congestion.   Eyes: Negative for blurred  vision and double vision.  Respiratory: Negative for cough and shortness of breath.   Cardiovascular: Positive for chest pain. Negative for palpitations and leg swelling.  Gastrointestinal: Positive for diarrhea. Negative for abdominal pain, nausea and vomiting.  Genitourinary: Negative for dysuria and urgency.  Musculoskeletal: Negative for back pain and myalgias.  Skin: Negative for rash.  Neurological: Negative for dizziness and headaches.    Patient Active Problem List   Diagnosis Date Noted  . Pulmonary embolism ([place]) 12/03/2016  . Acute pulmonary embolism ([place]) 12/03/2016  . S/P shoulder replacement, right 06/28/2016  . Vitamin D deficiency 12/05/2015  . Vitamin B 12 deficiency 12/05/2015  . H/O TB skin testing 12/05/2015  . AC (acromioclavicular) joint arthritis 08/29/2015  . Otitis media, acute suppurative 11/25/2014  . Well adult health check 09/01/2013  . Hammertoe 11/20/2012  . Psoriatic arthritis ([place]) 04/10/2012  . Pulmonary nodule seen on imaging study 11/08/2011  . Dysthymia 12/12/2010  . Pain in joint, shoulder region 10/03/2008  . Essential hypertension, benign 04/27/2008  . Palpitations 10/30/2007  . METATARSALGIA 10/02/2006  . KNEE REPLACEMENT, LEFT, HX OF 07/03/2006  . OBESITY, NOS 04/17/2006  . PANIC ATTACKS 04/17/2006  . GASTROESOPHAGEAL REFLUX, NO ESOPHAGITIS 04/17/2006  . CHOLELITHIASIS, NOS 04/17/2006  . VAGINITIS, ATROPHIC 04/17/2006  . ARTHRALGIA, UNSPECIFIED 04/17/2006    Past Medical History: Past Medical History:  Diagnosis Date  . Anemia   . Anxiety   . Arthritis   . Depression   . Dysrhythmia    occ due to caffeine intake  . Encounter for long-term (current) use of other medications   . GERD (gastroesophageal reflux disease)   . Hepatitis   . History of hiatal hernia   . Hypertension   . IBS (irritable bowel syndrome)   . Obesity   . Osteoarthritis   . Pain in joint, multiple sites   . Pneumonia   . PONV (postoperative nausea  and vomiting)    in the 1980's no problems since  . Psoriatic arthropathy ([place])   . Raynaud disease     Past Surgical History: Past Surgical History:  Procedure Laterality Date  . ABDOMINAL HYSTERECTOMY    . CHOLECYSTECTOMY    . COLONOSCOPY    . GASTRIC BYPASS    . HALLUX VALGUS CORRECTION  09/2010   Dr Arnetha Courser; right 1st ray  . HERNIA REPAIR    . JOINT REPLACEMENT     left knee times 3  . SHOULDER HEMI-ARTHROPLASTY Right 06/28/2016   Procedure: RIGHT SHOULDER HEMI-ARTHROPLASTY;  Surgeon: Netta Cedars, MD;  Location: [facility];  Service: Orthopedics;  Laterality: Right;  . TUBAL LIGATION      Social History: Social History  Substance Use Topics  . Smoking status: Never Smoker  . Smokeless tobacco: Never Used  . Alcohol use No   Additional social history: Patient was a cardiographic specialist  Please also refer to relevant sections of EMR.  Family History: Family History  Problem Relation Age of Onset  . Depression Mother   . Deep vein thrombosis Father     Allergies and Medications: Allergies  Allergen Reactions  . Vioxx [Rofecoxib] Swelling  EXTREME LEG SWELLING   . Augmentin [Amoxicillin-Pot Clavulanate] Diarrhea  . Bactrim [Sulfamethoxazole-Trimethoprim] Diarrhea and Nausea And Vomiting  . Banana Itching and Rash  . Cephalosporins Diarrhea and Nausea And Vomiting  . Ciprofloxacin Diarrhea and Nausea And Vomiting  . Coconut Flavor Itching and Rash  . Codeine Nausea Only  . Keflex [Cephalexin] Diarrhea    DOSES > 250 MG RESULTS IN ABD PAIN  . Zithromax [Azithromycin] Diarrhea and Nausea And Vomiting   No current facility-administered medications on file prior to encounter.    Current Outpatient Prescriptions on File Prior to Encounter  Medication Sig Dispense Refill  . acetaminophen (TYLENOL) 500 MG tablet Take 1,000 mg by mouth every 6 (six) hours as needed for mild pain.    Marland Kitchen ALPRAZolam (XANAX) 0.5 MG tablet TAKE 1 TABLET BY MOUTH THREE TIMES DAILY AS  NEEDED (Patient taking differently: TAKE 1 TABLET BY MOUTH THREE TIMES DAILY AS NEEDED FOR ANXIETY.  TAKE 1 MG EVERY NIGHT.) 90 tablet 5  . benazepril (LOTENSIN) 20 MG tablet Take 1 tablet (20 mg total) by mouth daily. 90 tablet 3  . cetirizine (ZYRTEC) 10 MG tablet Take 10 mg by mouth daily.    . Cholecalciferol (VITAMIN D3) 1000 units CAPS Take 1,000 Units by mouth daily.    . diclofenac (VOLTAREN) 75 MG EC tablet TAKE 1 TABLET BY MOUTH 2 TIMES DAILY AS NEEDED FOR JOINT PAIN (Patient taking differently: Take 75 mg by mouth 2 (two) times daily. ) 180 tablet 3  . diphenoxylate-atropine (LOMOTIL) 2.5-0.025 MG tablet Take 2 tablets by mouth 4 (four) times daily as needed for diarrhea or loose stools. 30 tablet 0  . esomeprazole (NEXIUM) 20 MG capsule Take 1 capsule (20 mg total) by mouth daily. 90 capsule 3  . fluconazole (DIFLUCAN) 100 MG tablet TAKE 1 TABLET BY MOUTH EVERY OTHER DAYS FOR 7 DOSES, THEN TAKE 1 TABLET ONCE WEEKLY THEREAFTER 21 tablet 3  . fluticasone (FLONASE) 50 MCG/ACT nasal spray Place 2 sprays into both nostrils daily as needed for allergies.  1  . furosemide (LASIX) 20 MG tablet TAKE 1 OR 2 TABLETS BY MOUTH DAILY FOR LOWER EXTREMITY SWELLING (Patient taking differently: Take 20-40 mg by mouth daily. TAKE 1 OR 2 TABLETS BY MOUTH DAILY FOR LOWER EXTREMITY SWELLING  DEPENDS ON AMOUNT OF SWELLING IF TAKES 1 OR 2 TABLETS) 180 tablet 3  . ibuprofen (ADVIL,MOTRIN) 200 MG tablet Take 400 mg by mouth every 6 (six) hours as needed for mild pain.    Marland Kitchen oxyCODONE-acetaminophen (ROXICET) 5-325 MG tablet Take 1-2 tablets by mouth every 4 (four) hours as needed for severe pain. 40 tablet 0  . valACYclovir (VALTREX) 500 MG tablet Take 1,000 mg by mouth daily as needed (outbreaks). For 3 days as needed  98  . venlafaxine XR (EFFEXOR-XR) 150 MG 24 hr capsule Take 1 capsule (150 mg total) by mouth daily. (Patient taking differently: Take 150 mg by mouth at bedtime. Take with venlafaxine xr 75 to equal  225 mg) 90 capsule 3  . venlafaxine XR (EFFEXOR-XR) 75 MG 24 hr capsule TAKE 1 CAPSULE BY MOUTH ONCE DAILY (IN ADDITION TO 150MG  CAPSULE FOR MAX OF 225MG  PER DAY) (Patient taking differently: Take 75 mg by mouth at bedtime. Take with Venlafaxine 150MG  to equal 225 mg) 90 capsule 3  . vitamin B-12 (CYANOCOBALAMIN) 1000 MCG tablet Take 1,000 mcg by mouth daily.      Objective: BP 125/71 (BP Location: Right Arm)   Pulse 69   Temp  98.3 F (36.8 C) (Oral)   Resp 16   Ht 4\' 11"  (1.499 m)   Wt 188 lb (85.3 kg)   SpO2 99%   BMI 37.97 kg/m  Exam: General: well-appearing middle-aged woman in no acute distress Eyes: PERRLA ENTM: no lymphadenopathy, MMM  Neck: normal range of motion,  Cardiovascular: RRR, , no murmurs or rubs noted, radial pulses 2+ Respiratory: CTA B, no wheezes no rhonchi,  Gastrointestinal: BS+, no tenderness to palpation KYH:CWCBJS upper and lower extremity appropriately, no numbness or tingling Derm: bilateral petechial rash on the toes and soles of feet- chronic improving per patient Neuro: alert and orientated 3, upper and lower extremity strength within normal limits, no changes in sensation Psych:  Mood appropriate   Labs and Imaging: CBC BMET   Recent Labs Lab 12/03/16 0134  WBC 10.6*  HGB 11.9*  HCT 37.0  PLT 289    Recent Labs Lab 12/03/16 0134  NA 134*  K 4.0  CL 100*  CO2 26  BUN 17  CREATININE 0.71  GLUCOSE 126*  CALCIUM 8.9      Dg Chest 2 View  Result Date: 12/03/2016 CLINICAL DATA:  Central chest pain. History of gastric bypass. Vomiting tonight. EXAM: CHEST  2 VIEW COMPARISON:  04/02/2016 FINDINGS: Shallow inspiration. Normal heart size and pulmonary vascularity. No focal airspace disease or consolidation in the lungs. No blunting of costophrenic angles. No pneumothorax. Mediastinal contours appear intact. Degenerative changes in the spine. Postoperative changes in the right shoulder and left upper quadrant. IMPRESSION: No active  cardiopulmonary disease. Electronically Signed   By: Lucienne Capers M.D.   On: 12/03/2016 02:16   Ct Angio Chest Pe W And/or Wo Contrast  Result Date: 12/03/2016 CLINICAL DATA:  Chest pain woke the patient up last night. Pain radiates to the back and the jaw. EXAM: CT ANGIOGRAPHY CHEST WITH CONTRAST TECHNIQUE: Multidetector CT imaging of the chest was performed using the standard protocol during bolus administration of intravenous contrast. Multiplanar CT image reconstructions and MIPs were obtained to evaluate the vascular anatomy. CONTRAST:  55 mL Isovue 370 COMPARISON:  04/02/2016 FINDINGS: Cardiovascular: Filling defects are demonstrated in left lower lobe subsegmental pulmonary arteries consistent with pulmonary embolus. RA acute RV ratio measures 1.294. This may indicate changes of right heart strain, however, the clot burden does not seem to be very severe. Mild cardiac enlargement. No pericardial effusion. Mediastinum/Nodes: No significant lymphadenopathy in the chest. Esophagus is decompressed. Small esophageal hiatal hernia. Lungs/Pleura: Linear atelectasis in the lung bases. No consolidation or airspace disease. No pleural effusions. No pneumothorax. Airways are patent. Scattered right pulmonary nodules. Largest is in the right middle lobe and measures about 5.5 mm diameter. No change since previous study. Upper Abdomen: Postoperative changes likely representing gastric bypass. Postoperative cholecystectomy. Musculoskeletal: Mild degenerative changes in the spine. No destructive bone lesions. Review of the MIP images confirms the above findings. IMPRESSION: Positive for acute PE with CT evidence of right heart strain (RV/LV Ratio = 1.294) consistent with at least submassive (intermediate risk) PE. The presence of right heart strain has been associated with an increased risk of morbidity and mortality. Please activate Code PE by paging [phone]. Unchanged appearance of right pulmonary nodules.  These results were called by telephone at the time of interpretation on 12/03/2016 at 6:16 am to PA. Montine Circle , who verbally acknowledged these results. Electronically Signed   By: Lucienne Capers M.D.   On: 12/03/2016 06:19   Tonette Bihari, MD 12/03/2016, 7:56 AM PGY-3,  [place] [facility] pager: [phone], text pages welcome

## 2016-12-03 NOTE — ED Notes (Signed)
Attempted report x1. 

## 2016-12-03 NOTE — ED Notes (Signed)
Heart Healthy diet ordered for patient

## 2016-12-03 NOTE — ED Notes (Signed)
Patient transported to CT 

## 2016-12-03 NOTE — ED Notes (Signed)
Admitting MD at bedside with patient. Notified of patients chest pain 7/10 at this time.

## 2016-12-03 NOTE — ED Triage Notes (Signed)
Pt presents to ED after being woken up from sleep by central chest pain.  Pt denies n/v, denies diaphoresis, denies light-headedness.  States pain radiated to her jaw and her back.  Pt states she has a hx of gastric bypass and threw up her dinner tonight, but was able to snack on other things after.  Pt sts two weeks ago she was diagnosed with vasculitis in her feet and "very small blood clots".  Pt is not on a blood thinner.  Pt states she took two of her husbands nitroglycerins without much relief.

## 2016-12-03 NOTE — Progress Notes (Signed)
ANTICOAGULATION CONSULT NOTE - Initial Consult  Pharmacy Consult for Heparin Indication: pulmonary embolus  Allergies  Allergen Reactions  . Vioxx [Rofecoxib] Swelling    EXTREME LEG SWELLING   . Augmentin [Amoxicillin-Pot Clavulanate] Diarrhea  . Bactrim [Sulfamethoxazole-Trimethoprim] Diarrhea and Nausea And Vomiting  . Banana Itching and Rash  . Cephalosporins Diarrhea and Nausea And Vomiting  . Ciprofloxacin Diarrhea and Nausea And Vomiting  . Coconut Flavor Itching and Rash  . Codeine Nausea Only  . Keflex [Cephalexin] Diarrhea    DOSES > 250 MG RESULTS IN ABD PAIN  . Zithromax [Azithromycin] Diarrhea and Nausea And Vomiting    Patient Measurements: Height: 4\' 11"  (149.9 cm) Weight: 188 lb (85.3 kg) IBW/kg (Calculated) : 43.2 Heparin Dosing Weight: 65 kg  Vital Signs: Temp: 98.3 F (36.8 C) (10/16 0417) Temp Source: Oral (10/16 0417) BP: 125/71 (10/16 0417) Pulse Rate: 69 (10/16 0417)  Labs:  Recent Labs  12/03/16 0134  HGB 11.9*  HCT 37.0  PLT 289  CREATININE 0.71    Estimated Creatinine Clearance: 67.3 mL/min (by C-G formula based on SCr of 0.71 mg/dL).   Medical History: Past Medical History:  Diagnosis Date  . Anemia   . Anxiety   . Arthritis   . Depression   . Dysrhythmia    occ due to caffeine intake  . Encounter for long-term (current) use of other medications   . GERD (gastroesophageal reflux disease)   . Hepatitis   . History of hiatal hernia   . Hypertension   . IBS (irritable bowel syndrome)   . Obesity   . Osteoarthritis   . Pain in joint, multiple sites   . Pneumonia   . PONV (postoperative nausea and vomiting)    in the 1980's no problems since  . Psoriatic arthropathy ([place])   . Raynaud disease     Medications:  No current facility-administered medications on file prior to encounter.    Current Outpatient Prescriptions on File Prior to Encounter  Medication Sig Dispense Refill  . acetaminophen (TYLENOL) 500 MG tablet  Take 1,000 mg by mouth every 6 (six) hours as needed for mild pain.    Marland Kitchen ALPRAZolam (XANAX) 0.5 MG tablet TAKE 1 TABLET BY MOUTH THREE TIMES DAILY AS NEEDED 90 tablet 5  . benazepril (LOTENSIN) 20 MG tablet Take 1 tablet (20 mg total) by mouth daily. 90 tablet 3  . cetirizine (ZYRTEC) 10 MG tablet Take 10 mg by mouth daily.    . Cholecalciferol (VITAMIN D3) 1000 units CAPS Take 1,000 Units by mouth daily.    . diclofenac (VOLTAREN) 75 MG EC tablet TAKE 1 TABLET BY MOUTH 2 TIMES DAILY AS NEEDED FOR JOINT PAIN (Patient taking differently: Take 75 mg by mouth 2 (two) times daily. ) 180 tablet 3  . diphenoxylate-atropine (LOMOTIL) 2.5-0.025 MG tablet Take 2 tablets by mouth 4 (four) times daily as needed for diarrhea or loose stools. 30 tablet 0  . esomeprazole (NEXIUM) 20 MG capsule Take 1 capsule (20 mg total) by mouth daily. 90 capsule 3  . fluconazole (DIFLUCAN) 100 MG tablet TAKE 1 TABLET BY MOUTH EVERY OTHER DAYS FOR 7 DOSES, THEN TAKE 1 TABLET ONCE WEEKLY THEREAFTER 21 tablet 3  . fluticasone (FLONASE) 50 MCG/ACT nasal spray Place 2 sprays into both nostrils daily as needed for allergies.  1  . furosemide (LASIX) 20 MG tablet TAKE 1 OR 2 TABLETS BY MOUTH DAILY FOR LOWER EXTREMITY SWELLING (Patient taking differently: Take 20-40 mg by mouth daily. TAKE 1 OR  2 TABLETS BY MOUTH DAILY FOR LOWER EXTREMITY SWELLING  DEPENDS ON AMOUNT OF SWELLING IF TAKES 1 OR 2 TABLETS) 180 tablet 3  . ibuprofen (ADVIL,MOTRIN) 200 MG tablet Take 400 mg by mouth every 6 (six) hours as needed for mild pain.    . metroNIDAZOLE (FLAGYL) 500 MG tablet Take 1 tablet (500 mg total) by mouth 3 (three) times daily. 21 tablet 0  . Multiple Vitamins-Minerals (CENTRUM SILVER PO) Take 1 tablet by mouth daily.    Marland Kitchen oxyCODONE-acetaminophen (ROXICET) 5-325 MG tablet Take 1-2 tablets by mouth every 4 (four) hours as needed for severe pain. 40 tablet 0  . valACYclovir (VALTREX) 500 MG tablet Take 1,000 mg by mouth daily as needed  (outbreaks). For 3 days as needed  98  . venlafaxine XR (EFFEXOR-XR) 150 MG 24 hr capsule Take 1 capsule (150 mg total) by mouth daily. (Patient taking differently: Take 150 mg by mouth at bedtime. Take with venlafaxine xr 75 to equal 225 mg) 90 capsule 3  . venlafaxine XR (EFFEXOR-XR) 75 MG 24 hr capsule TAKE 1 CAPSULE BY MOUTH ONCE DAILY (IN ADDITION TO 150MG  CAPSULE FOR MAX OF 225MG  PER DAY) (Patient taking differently: Take 75 mg by mouth at bedtime. Take with Venlafaxine 150MG  to equal 225 mg) 90 capsule 3  . vitamin B-12 (CYANOCOBALAMIN) 1000 MCG tablet Take 1,000 mcg by mouth daily.       Assessment: 66 y.o. female with PE for heparin Goal of Therapy:  Heparin level 0.3-0.7 units/ml Monitor platelets by anticoagulation protocol: Yes   Plan:  Heparin 4000 units IV bolus, then start heparin 1100 units/hr Check heparin level in 6 hours.   Monik Lins, Bronson Curb 12/03/2016,6:23 AM

## 2016-12-03 NOTE — Progress Notes (Signed)
[place] for Heparin Indication: pulmonary embolus  Allergies  Allergen Reactions  . Vioxx [Rofecoxib] Swelling    EXTREME LEG SWELLING   . Augmentin [Amoxicillin-Pot Clavulanate] Diarrhea  . Bactrim [Sulfamethoxazole-Trimethoprim] Diarrhea and Nausea And Vomiting  . Banana Itching and Rash  . Cephalosporins Diarrhea and Nausea And Vomiting  . Ciprofloxacin Diarrhea and Nausea And Vomiting  . Coconut Flavor Itching and Rash  . Codeine Nausea Only  . Keflex [Cephalexin] Diarrhea    DOSES > 250 MG RESULTS IN ABD PAIN  . Zithromax [Azithromycin] Diarrhea and Nausea And Vomiting    Patient Measurements: Height: 4\' 11"  (149.9 cm) Weight: 188 lb (85.3 kg) IBW/kg (Calculated) : 43.2 Heparin Dosing Weight: 65 kg  Vital Signs: Temp: 98.5 F (36.9 C) (10/16 1712) Temp Source: Oral (10/16 1712) BP: 129/77 (10/16 1712) Pulse Rate: 75 (10/16 1545)  Labs:  Recent Labs  12/03/16 0134 12/03/16 0859 12/03/16 1518 12/03/16 1824  HGB 11.9*  --   --   --   HCT 37.0  --   --   --   PLT 289  --   --   --   HEPARINUNFRC  --   --   --  0.79*  CREATININE 0.71  --   --   --   TROPONINI  --  <0.03 <0.03  --     Estimated Creatinine Clearance: 67.3 mL/min (by C-G formula based on SCr of 0.71 mg/dL).   Medical History: Past Medical History:  Diagnosis Date  . Anemia   . Anxiety   . Arthritis   . Depression   . Dysrhythmia    occ due to caffeine intake  . Encounter for long-term (current) use of other medications   . GERD (gastroesophageal reflux disease)   . Hepatitis   . History of hiatal hernia   . Hypertension   . IBS (irritable bowel syndrome)   . Obesity   . Osteoarthritis   . Pain in joint, multiple sites   . Pneumonia   . PONV (postoperative nausea and vomiting)    in the 1980's no problems since  . Psoriatic arthropathy ([place])   . Raynaud disease     Assessment: 66 y.o. female with acute submassive PE, holding off on  lytics for now. On heparin gtt now, initial level is supratherapeutic.    Goal of Therapy:  Heparin level 0.3-0.7 units/ml Monitor platelets by anticoagulation protocol: Yes    Plan:  Reduce heparin to 950 units/hr  Daily HL, CBC Next level with AM labs F/u conversion to oral anticoag   Harvel Quale 12/03/2016,7:19 PM

## 2016-12-03 NOTE — ED Notes (Signed)
Gave bedside report to [employer], attempted phone report x4 prior to transport

## 2016-12-04 ENCOUNTER — Inpatient Hospital Stay (HOSPITAL_COMMUNITY): Payer: BLUE CROSS/BLUE SHIELD

## 2016-12-04 ENCOUNTER — Other Ambulatory Visit: Payer: Self-pay

## 2016-12-04 DIAGNOSIS — F411 Generalized anxiety disorder: Secondary | ICD-10-CM

## 2016-12-04 DIAGNOSIS — I5030 Unspecified diastolic (congestive) heart failure: Secondary | ICD-10-CM

## 2016-12-04 DIAGNOSIS — I10 Essential (primary) hypertension: Secondary | ICD-10-CM

## 2016-12-04 DIAGNOSIS — I2699 Other pulmonary embolism without acute cor pulmonale: Principal | ICD-10-CM

## 2016-12-04 DIAGNOSIS — I371 Nonrheumatic pulmonary valve insufficiency: Secondary | ICD-10-CM

## 2016-12-04 LAB — COMPREHENSIVE METABOLIC PANEL
ALT: 43 U/L (ref 14–54)
AST: 31 U/L (ref 15–41)
Albumin: 3 g/dL — ABNORMAL LOW (ref 3.5–5.0)
Alkaline Phosphatase: 137 U/L — ABNORMAL HIGH (ref 38–126)
Anion gap: 8 (ref 5–15)
BUN: 10 mg/dL (ref 6–20)
CHLORIDE: 105 mmol/L (ref 101–111)
CO2: 26 mmol/L (ref 22–32)
Calcium: 8.8 mg/dL — ABNORMAL LOW (ref 8.9–10.3)
Creatinine, Ser: 0.52 mg/dL (ref 0.44–1.00)
GFR calc Af Amer: >60 mL/min (ref >60)
GFR calc non Af Amer: >60 mL/min (ref >60)
GLUCOSE: 87 mg/dL (ref 65–99)
POTASSIUM: 3.8 mmol/L (ref 3.5–5.1)
Sodium: 139 mmol/L (ref 135–145)
Total Bilirubin: 0.4 mg/dL (ref 0.3–1.2)
Total Protein: 5.8 g/dL — ABNORMAL LOW (ref 6.5–8.1)

## 2016-12-04 LAB — HEPARIN LEVEL (UNFRACTIONATED): HEPARIN UNFRACTIONATED: 0.63 [IU]/mL (ref 0.30–0.70)

## 2016-12-04 LAB — CBC
HEMATOCRIT: 38.2 % (ref 36.0–46.0)
Hemoglobin: 11.9 g/dL — ABNORMAL LOW (ref 12.0–15.0)
MCH: 27 pg (ref 26.0–34.0)
MCHC: 31.2 g/dL (ref 30.0–36.0)
MCV: 86.8 fL (ref 78.0–100.0)
PLATELETS: 262 10*3/uL (ref 150–400)
RBC: 4.4 MIL/uL (ref 3.87–5.11)
RDW: 14.2 % (ref 11.5–15.5)
WBC: 9.7 10*3/uL (ref 4.0–10.5)

## 2016-12-04 LAB — ANTITHROMBIN III: AntiThromb III Func: 106 % (ref 75–120)

## 2016-12-04 LAB — TROPONIN I

## 2016-12-04 LAB — ECHOCARDIOGRAM COMPLETE
HEIGHTINCHES: 59 in
WEIGHTICAEL: 3073.6 [oz_av]

## 2016-12-04 MED ORDER — ACETAMINOPHEN 325 MG PO TABS: 650.0000 mg | ORAL_TABLET | Freq: Four times a day (QID) | ORAL | Status: DC | PRN

## 2016-12-04 MED ORDER — APIXABAN 5 MG PO TABS
10.0000 mg | ORAL_TABLET | Freq: Two times a day (BID) | ORAL | Status: DC
  Administered 2016-12-04 – 2016-12-05 (×3): 10 mg via ORAL
  Filled 2016-12-04 (×3): qty 2

## 2016-12-04 MED ORDER — VENLAFAXINE HCL ER 75 MG PO CP24
225.0000 mg | ORAL_CAPSULE | Freq: Every day | ORAL | Status: DC
  Administered 2016-12-04: 225 mg via ORAL
  Filled 2016-12-04 (×3): qty 1

## 2016-12-04 NOTE — Progress Notes (Signed)
ANTICOAGULATION CONSULT NOTE - Follow Up Consult  Pharmacy Consult for Heparin Indication: pulmonary embolus  Allergies  Allergen Reactions  . Vioxx [Rofecoxib] Swelling    EXTREME LEG SWELLING   . Augmentin [Amoxicillin-Pot Clavulanate] Diarrhea  . Bactrim [Sulfamethoxazole-Trimethoprim] Diarrhea and Nausea And Vomiting  . Banana Itching and Rash  . Cephalosporins Diarrhea and Nausea And Vomiting  . Ciprofloxacin Diarrhea and Nausea And Vomiting  . Coconut Flavor Itching and Rash  . Codeine Nausea Only  . Keflex [Cephalexin] Diarrhea    DOSES > 250 MG RESULTS IN ABD PAIN  . Zithromax [Azithromycin] Diarrhea and Nausea And Vomiting    Patient Measurements: Height: 4\' 11"  (149.9 cm) Weight: 192 lb 1.6 oz (87.1 kg) IBW/kg (Calculated) : 43.2 Heparin Dosing Weight:  63.4 kg  Vital Signs: Temp: 97.6 F (36.4 C) (10/17 0700) Temp Source: Oral (10/17 0700) BP: 139/76 (10/17 0328) Pulse Rate: 62 (10/17 0328)  Labs:  Recent Labs  12/03/16 0134 12/03/16 0859 12/03/16 1518 12/03/16 1824 12/03/16 2031 12/04/16 0405  HGB 11.9*  --   --   --   --  11.9*  HCT 37.0  --   --   --   --  38.2  PLT 289  --   --   --   --  262  HEPARINUNFRC  --   --   --  0.79*  --  0.63  CREATININE 0.71  --   --   --   --  0.52  TROPONINI  --  <0.03 <0.03  --  <0.03  --     Estimated Creatinine Clearance: 68.2 mL/min (by C-G formula based on SCr of 0.52 mg/dL).   Assessment:  Anticoag: Vasculitis induced PE on IV heparin. HL 0.63, Hgb 11.9 stable overnight, Plts stable.  Goal of Therapy:  Heparin level 0.3-0.7 units/ml Monitor platelets by anticoagulation protocol: Yes   Plan:  Continue IV heparin at 950 units/hr Daily HL and CBC F/u choice of anticoagulation   Liddie Chichester S. Alford Highland, PharmD, BCPS Clinical Staff Pharmacist Pager [phone]  Eilene Ghazi Stillinger 12/04/2016,9:57 AM

## 2016-12-04 NOTE — Progress Notes (Signed)
Nutrition Brief Note  Patient identified on the Malnutrition Screening Tool (MST) Report. Patient reports intentional weight loss earlier this year related to healthier eating (to support husband who had a heart attack). She lost 7% of usual weight within the past 3 months, which is not significant for the time frame. She reports being sick with diarrhea 10-15 times per day a few months ago. She has been seeing a GI doctor and weight has been stable for the past month. Now has a good appetite and has been eating well.  Wt Readings from Last 15 Encounters:  12/04/16 192 lb 1.6 oz (87.1 kg)  10/23/16 201 lb 12.8 oz (91.5 kg)  10/02/16 207 lb (93.9 kg)  06/20/16 214 lb 15.2 oz (97.5 kg)  06/05/16 218 lb (98.9 kg)  04/02/16 217 lb 12.8 oz (98.8 kg)  04/02/16 211 lb (95.7 kg)  03/22/16 211 lb (95.7 kg)  08/25/15 212 lb (96.2 kg)  08/04/15 212 lb (96.2 kg)  08/02/15 212 lb 12.8 oz (96.5 kg)  02/22/15 222 lb 3.2 oz (100.8 kg)  11/30/14 218 lb 9.6 oz (99.2 kg)  11/25/14 218 lb (98.9 kg)  09/01/13 217 lb 8 oz (98.7 kg)    Body mass index is 38.8 kg/m. Patient meets criteria for class 2 obesity based on current BMI.   Current diet order is Regular, patient is consuming approximately 100% of meals at this time. Labs and medications reviewed.   No nutrition interventions warranted at this time. If nutrition issues arise, please consult RD.   Molli Barrows, RD, LDN, [place] Pager [phone] After Hours Pager [phone]

## 2016-12-04 NOTE — Progress Notes (Signed)
Family Medicine Teaching Service Daily Progress Note Intern Pager: [phone]  Patient name: [name] Medical record number: [id number] Date of birth: 1951-01-13 Age: 66 y.o. Gender: female  Primary Care Provider: [name], MD Consultants: none Code Status: full   Pt Overview and Major Events to Date:  Admitted to [facility] on 12/03/2016  Assessment and Plan: [name] is a 66 y.o. female presenting with chest pain found to have a submassive PE. PMH is significant for rheumatoid arthritis, vasculitis, collagense colitis, HLD, HTN, GERD  Chest Pain likely 2/2 Acute Submassive PE Patient presenting with chest pain,sudden onset with findings of submassive PE on CTA. Patient today reports improvement in chest pain.  She has a history of vasculitis which could be contributing to clot presentation. CTA was notable for an acute PE with right heart strain; RV/LV ratio of 1.294. EKG this morning showing sinus bradycardia. Patient's vitals have been stable. Patient is sating 96% on room air, no respiratory distress, RR 18. Troponin negative3. Have consulted hematology who recommends obtaining hypercoagulable panel and outpatient follow up.  - Continous pulse ox and cardiac monitoring - CCM contacted, patient discussed - if vitals changes - oxygen requirement, up trending troponin, hypotension-  will call CCM back to consider TPA - Heparin drip for now, will transition to oral anticoagulation per hematology recommendations  - ECHO pending - Consult hematology for clotting disorder labs, appreciate recommendations. Will plan to re-consult concerning anticoagulation therapy recommendations - Continue to watch vitals closely  - Pain control with tylenol, if patient needs further pain control consider fentanyl in order not to low blood pressure   Collagenous colitis Recently diagnosed with via biopsy following a hx of diarrhea for several months. Currently well controlled on budesonide  -  Continue budesonide   Vasculitis Unspecified Diagnosis via dermatology about 2 months ago, petechial rash noted on the bottom of bilateral feet. Patient on prednisone for this issue. Currently on a taper  - Continue prednisone 5 mg daily   Rheumatoid Arthritis Patient previously on Remicad. Has been stopped since diagnosis of vasculitis  - Currently on two steroids  - Holding diclofenac for now   HTN Currently Normotensive  - Holding Benapril  - Watch blood pressure closely for decompensation   Seasonal Allergies  - Continue flonase and claritin   GAD Home venlafaxine and xanax (takes 1 mg at night) - Continue home medication, will increase venlafaxine to be her home dose  FEN/GI: Protonix  Prophylaxis: Heparin drip for PE   Disposition: will continue inpatient stay for anticoagulation   Subjective:  Patient today reports she is doing better. States no chest pain today. States she had some "twinges" yesterday when walking in room but denies any today. Denies SOB or CP. Patient does not like her diet in the hospital but understands it is protocol to give heart healthy diet. Patient also states her venlafaxine was ordered incorrectly at admission. She states her home dose was 225 mg daily but here she is only getting 150 mg.   Objective: Temp:  [97.4 F (36.3 C)-98.5 F (36.9 C)] 98 F (36.7 C) (10/17 1100) Pulse Rate:  [58-85] 62 (10/17 0328) Resp:  [11-21] 14 (10/17 0328) BP: (103-139)/(63-77) 139/76 (10/17 0328) SpO2:  [94 %-99 %] 99 % (10/17 0328) Weight:  [192 lb 1.6 oz (87.1 kg)] 192 lb 1.6 oz (87.1 kg) (10/17 0328) Physical Exam: General: awake and alert, NAD, laying in bed  Cardiovascular: RRR, no MRG Respiratory: slight crackles at bases bilaterally  Abdomen: soft, non tender, non distended, bowel sounds x 4 quadrants  Extremities: no edema, slight tenderness in calves bilaterally, negative Homan's sign bilaterally Derm: bilateral petechial rash on the toes  - chronic improving per patient  Laboratory:  Recent Labs Lab 12/03/16 0134 12/04/16 0405  WBC 10.6* 9.7  HGB 11.9* 11.9*  HCT 37.0 38.2  PLT 289 262    Recent Labs Lab 12/03/16 0134 12/04/16 0405  NA 134* 139  K 4.0 3.8  CL 100* 105  CO2 26 26  BUN 17 10  CREATININE 0.71 0.52  CALCIUM 8.9 8.8*  PROT  --  5.8*  BILITOT  --  0.4  ALKPHOS  --  137*  ALT  --  43  AST  --  31  GLUCOSE 126* 87    Ref. Range 12/04/2016 04:05  Heparin Unfractionated Latest Ref Range: 0.30 - 0.70 IU/mL 0.63    Imaging/Diagnostic Tests: Dg Chest 2 View  Result Date: 12/03/2016 CLINICAL DATA:  Central chest pain. History of gastric bypass. Vomiting tonight. EXAM: CHEST  2 VIEW COMPARISON:  04/02/2016 FINDINGS: Shallow inspiration. Normal heart size and pulmonary vascularity. No focal airspace disease or consolidation in the lungs. No blunting of costophrenic angles. No pneumothorax. Mediastinal contours appear intact. Degenerative changes in the spine. Postoperative changes in the right shoulder and left upper quadrant. IMPRESSION: No active cardiopulmonary disease. Electronically Signed   By: Lucienne Capers M.D.   On: 12/03/2016 02:16   Ct Angio Chest Pe W And/or Wo Contrast  Result Date: 12/03/2016 CLINICAL DATA:  Chest pain woke the patient up last night. Pain radiates to the back and the jaw. EXAM: CT ANGIOGRAPHY CHEST WITH CONTRAST TECHNIQUE: Multidetector CT imaging of the chest was performed using the standard protocol during bolus administration of intravenous contrast. Multiplanar CT image reconstructions and MIPs were obtained to evaluate the vascular anatomy. CONTRAST:  55 mL Isovue 370 COMPARISON:  04/02/2016 FINDINGS: Cardiovascular: Filling defects are demonstrated in left lower lobe subsegmental pulmonary arteries consistent with pulmonary embolus. RA acute RV ratio measures 1.294. This may indicate changes of right heart strain, however, the clot burden does not seem to be very  severe. Mild cardiac enlargement. No pericardial effusion. Mediastinum/Nodes: No significant lymphadenopathy in the chest. Esophagus is decompressed. Small esophageal hiatal hernia. Lungs/Pleura: Linear atelectasis in the lung bases. No consolidation or airspace disease. No pleural effusions. No pneumothorax. Airways are patent. Scattered right pulmonary nodules. Largest is in the right middle lobe and measures about 5.5 mm diameter. No change since previous study. Upper Abdomen: Postoperative changes likely representing gastric bypass. Postoperative cholecystectomy. Musculoskeletal: Mild degenerative changes in the spine. No destructive bone lesions. Review of the MIP images confirms the above findings. IMPRESSION: Positive for acute PE with CT evidence of right heart strain (RV/LV Ratio = 1.294) consistent with at least submassive (intermediate risk) PE. The presence of right heart strain has been associated with an increased risk of morbidity and mortality. Please activate Code PE by paging [phone]. Unchanged appearance of right pulmonary nodules. These results were called by telephone at the time of interpretation on 12/03/2016 at 6:16 am to PA. Montine Circle , who verbally acknowledged these results. Electronically Signed   By: Lucienne Capers M.D.   On: 12/03/2016 06:19     Caroline More, DO 12/04/2016, 12:33 PM PGY-1, [place] Intern pager: [phone], text pages welcome

## 2016-12-04 NOTE — Care Management Note (Addendum)
Case Management Note  Patient Details  Name: [name] MRN: [id number] Date of Birth: 1950-10-08  Subjective/Objective:   Pt presented for Chest Pain- CTA positive for PE. Initiated on IV Heparin Gtt.                   Action/Plan: Benefits Check completed for Xarelto vs Eliquis: Once chosen CM will assist with 30 day free/ co pay card. [facility] has medication in stock. CM will continue to monitor for additional needs.     S/W STACE[facility] @ [facility] [employer] # [phone]   1. XARELTO 15 MG BID  COVER- YES  CO-PAY- ZERO DOLLARS  TIER- 3 DRUG  PRIOR APPROVAL- NO   2. XARELTO 15 MG DAILY  COVER- YES  CO-PAY- ZERO DOLLARS  TIER- 3 DRUG  PRIOR APPROVAL- NO   3. ELIQUIS 2.5 MG BID  COVER- YES  CO-PAY- ZERO DOLLARS  TIER- 3 DRUG  PRIOR APPROVAL- NO   4. ELIQUIS 5 MG BID  COVER- YES  CO-PAY- ZERO DOLLARS  TIER- 3 DRUG  PRIOR APPROVAL- NO   ( ELIQUIS 10 MG - NO )   PREFERRED PHARMACY : [facility]   Expected Discharge Date:                  Expected Discharge Plan:  Home/Self Care  In-House Referral:  NA  Discharge planning Services  CM Consult, Medication Assistance  Post Acute Care Choice:  NA Choice offered to:  NA  DME Arranged:  N/A DME Agency:  NA  HH Arranged:  NA HH Agency:  NA  Status of Service:  Completed, signed off  If discussed at Long Length of Stay Meetings, dates discussed:    Additional Comments:  Bethena Roys, RN 12/04/2016, 11:44 AM

## 2016-12-04 NOTE — Progress Notes (Signed)
ANTICOAGULATION CONSULT NOTE - Follow Up Consult  Pharmacy Consult for Heparin to Eliquis Indication: pulmonary embolus  Allergies  Allergen Reactions  . Vioxx [Rofecoxib] Swelling    EXTREME LEG SWELLING   . Augmentin [Amoxicillin-Pot Clavulanate] Diarrhea  . Bactrim [Sulfamethoxazole-Trimethoprim] Diarrhea and Nausea And Vomiting  . Banana Itching and Rash  . Cephalosporins Diarrhea and Nausea And Vomiting  . Ciprofloxacin Diarrhea and Nausea And Vomiting  . Coconut Flavor Itching and Rash  . Codeine Nausea Only  . Keflex [Cephalexin] Diarrhea    DOSES > 250 MG RESULTS IN ABD PAIN  . Zithromax [Azithromycin] Diarrhea and Nausea And Vomiting    Patient Measurements: Height: 4\' 11"  (149.9 cm) Weight: 192 lb 1.6 oz (87.1 kg) IBW/kg (Calculated) : 43.2 Heparin Dosing Weight:  63.4 kg  Vital Signs: Temp: 98 F (36.7 C) (10/17 1100) Temp Source: Oral (10/17 1100)  Labs:  Recent Labs  12/03/16 0134 12/03/16 0859 12/03/16 1518 12/03/16 1824 12/03/16 2031 12/04/16 0405  HGB 11.9*  --   --   --   --  11.9*  HCT 37.0  --   --   --   --  38.2  PLT 289  --   --   --   --  262  HEPARINUNFRC  --   --   --  0.79*  --  0.63  CREATININE 0.71  --   --   --   --  0.52  TROPONINI  --  <0.03 <0.03  --  <0.03  --     Estimated Creatinine Clearance: 68.2 mL/min (by C-G formula based on SCr of 0.52 mg/dL).   Assessment: 66 year old female transitioning to Eliquis for PE  Goal of Therapy:  Heparin level 0.3-0.7 units/ml Monitor platelets by anticoagulation protocol: Yes   Plan:  Eliquis 10 mg po BID x 7 days then 5 mg po BID DC heparin after first dose of Eliquis  Thank you Anette Guarneri, PharmD [phone]  12/04/2016,4:20 PM

## 2016-12-04 NOTE — Progress Notes (Signed)
FPTS Interim Progress Note  Discussed anticoagulation with hematologist on call. Doctor recommends transition to anticoagulation as if patient did not have vasculitis. Doctor states that patient should be treated as normal pulmonary embolism with outpatient follow up with hematology. Recommended either xarelto or elliquis and left dosage to FPTS recommendations.   Caroline More, DO 12/04/2016, 3:23 PM PGY-1, [place] Medicine Service pager [phone]

## 2016-12-04 NOTE — Progress Notes (Signed)
  Echocardiogram 2D Echocardiogram has been performed.  Jannett Celestine 12/04/2016, 12:41 PM

## 2016-12-05 LAB — PROTEIN S ACTIVITY: Protein S Activity: 85 % (ref 63–140)

## 2016-12-05 LAB — BASIC METABOLIC PANEL
Anion gap: 6 (ref 5–15)
BUN: 10 mg/dL (ref 6–20)
CALCIUM: 8.6 mg/dL — AB (ref 8.9–10.3)
CHLORIDE: 106 mmol/L (ref 101–111)
CO2: 26 mmol/L (ref 22–32)
CREATININE: 0.59 mg/dL (ref 0.44–1.00)
Glucose, Bld: 94 mg/dL (ref 65–99)
Potassium: 3.8 mmol/L (ref 3.5–5.1)
Sodium: 138 mmol/L (ref 135–145)

## 2016-12-05 LAB — PROTEIN C ACTIVITY: Protein C Activity: 172 % (ref 73–180)

## 2016-12-05 LAB — LUPUS ANTICOAGULANT PANEL
DRVVT: 27.8 s (ref 0.0–47.0)
PTT Lupus Anticoagulant: 29.5 s (ref 0.0–51.9)

## 2016-12-05 LAB — PROTEIN S, TOTAL: Protein S Ag, Total: 69 % (ref 60–150)

## 2016-12-05 LAB — PROTEIN C, TOTAL: PROTEIN C, TOTAL: 161 % — AB (ref 60–150)

## 2016-12-05 LAB — HOMOCYSTEINE: Homocysteine: 8.5 umol/L (ref 0.0–15.0)

## 2016-12-05 MED ORDER — ALPRAZOLAM 1 MG PO TABS: 1.0000 mg | ORAL_TABLET | Freq: Every evening | ORAL | 0 refills | Status: DC | PRN

## 2016-12-05 MED ORDER — ELIQUIS 5 MG VTE STARTER PACK: ORAL_TABLET | ORAL | 0 refills | Status: DC

## 2016-12-05 MED FILL — ALPRAZolam 1 MG TABS: 1 | 30 days supply | Qty: 30 | Fill #0

## 2016-12-05 MED FILL — ELIQUIS STARTER PACK 5 MG T: 5 | 30 days supply | Qty: 74 | Fill #0

## 2016-12-05 NOTE — Progress Notes (Signed)
Pt complained of 5/10 chest pain throbbing on the left side. EKG done and MD notified. No changes in care at this time. Will continue to monitor.

## 2016-12-05 NOTE — Discharge Instructions (Addendum)
Information on my medicine - ELIQUIS (apixaban)  This medication education was reviewed with me or my healthcare representative as part of my discharge preparation.  The pharmacist that spoke with me during my hospital stay was:  Wayland Salinas, [facility]  Why was Eliquis prescribed for you? Eliquis was prescribed to treat blood clots that may have been found in the veins of your legs (deep vein thrombosis) or in your lungs (pulmonary embolism) and to reduce the risk of them occurring again.  What do You need to know about Eliquis ? The starting dose is 10 mg (two 5 mg tablets) taken TWICE daily for the FIRST SEVEN (7) DAYS, then on (enter date)  12/11/16 PM  the dose is reduced to ONE 5 mg tablet taken TWICE daily.  Eliquis may be taken with or without food.   Try to take the dose about the same time in the morning and in the evening. If you have difficulty swallowing the tablet whole please discuss with your pharmacist how to take the medication safely.  Take Eliquis exactly as prescribed and DO NOT stop taking Eliquis without talking to the doctor who prescribed the medication.  Stopping may increase your risk of developing a new blood clot.  Refill your prescription before you run out.  After discharge, you should have regular check-up appointments with your healthcare provider that is prescribing your Eliquis.    What do you do if you miss a dose? If a dose of ELIQUIS is not taken at the scheduled time, take it as soon as possible on the same day and twice-daily administration should be resumed. The dose should not be doubled to make up for a missed dose.  Important Safety Information A possible side effect of Eliquis is bleeding. You should call your healthcare provider right away if you experience any of the following: ? Bleeding from an injury or your nose that does not stop. ? Unusual colored urine (red or dark brown) or unusual colored stools (red or  black). ? Unusual bruising for unknown reasons. ? A serious fall or if you hit your head (even if there is no bleeding).  Some medicines may interact with Eliquis and might increase your risk of bleeding or clotting while on Eliquis. To help avoid this, consult your healthcare provider or pharmacist prior to using any new prescription or non-prescription medications, including herbals, vitamins, non-steroidal anti-inflammatory drugs (NSAIDs) and supplements.  This website has more information on Eliquis (apixaban): http://www.eliquis.com/eliquis/home

## 2016-12-05 NOTE — Progress Notes (Addendum)
Family Medicine Teaching Service Daily Progress Note Intern Pager: [phone]  Patient name: [name] Medical record number: [id number] Date of birth: 14-Feb-1951 Age: 66 y.o. Gender: female  Primary Care Provider: [name], MD Consultants: none Code Status: full   Pt Overview and Major Events to Date:  Admitted to [facility] on 12/03/2016  Assessment and Plan: [name] is a 66 y.o. female presenting with chest pain found to have a submassive PE. PMH is significant for rheumatoid arthritis, vasculitis, collagense colitis, HLD, HTN, GERD  Chest Pain likely 2/2 Acute Submassive PE Patient presenting with chest pain,sudden onset with findings of submassive PE on CTA. Patient today reports improvement in chest pain.  She has a history of vasculitis which could be contributing to clot presentation. CTA was notable for an acute PE with right heart strain; RV/LV ratio of 1.294. EKG this morning showing sinus bradycardia. Patient's vitals have been stable. Patient is sating 96% on room air, no respiratory distress, RR 18. Troponin negative3. Have consulted hematology who recommends obtaining hypercoagulable panel and outpatient follow up.  ECHO showed G1DD, minimal mitral regurg - lower med-surg level - CCM contacted, patient discussed - if vitals changes - oxygen requirement, up trending troponin, hypotension-  will call CCM back to consider TPA - transitioning to elequis - Consult hematology for clotting disorder labs, appreciate recommendations. Will plan to re-consult concerning anticoagulation therapy recommendations - Continue to watch vitals closely  - Pain control with tylenol, if patient needs further pain control consider fentanyl in order not to low blood pressure   Collagenous colitis Recently diagnosed with via biopsy following a hx of diarrhea for several months. Currently well controlled on budesonide  - Continue budesonide   Vasculitis Unspecified Diagnosis via  dermatology about 2 months ago, petechial rash noted on the bottom of bilateral feet. Patient on prednisone for this issue. Currently on a taper  - Continue prednisone 5 mg daily   Rheumatoid Arthritis Patient previously on Remicad. Has been stopped since diagnosis of vasculitis  - Currently on two steroids  - Holding diclofenac for now   HTN Currently Normotensive  - Holding Benapril  - Watch blood pressure closely for decompensation   Seasonal Allergies  - Continue flonase and claritin   GAD Home venlafaxine and xanax (takes 1 mg at night) - Continue home medication, will increase venlafaxine to be her home dose  FEN/GI: Protonix  Prophylaxis: Heparin drip for PE   Disposition: will continue inpatient stay for anticoagulation   Subjective:  Had an episode of pain last night that resolved in <26min, btu feeling fine since then with exception of new pain in R leg.  We discussed how we might do a doppler or not because it would not change treatment  Objective: Temp:  [97.5 F (36.4 C)-98.6 F (37 C)] 97.5 F (36.4 C) (10/18 0455) Pulse Rate:  [67-73] 67 (10/18 0455) Resp:  [17-20] 20 (10/18 0455) BP: (114-144)/(55-72) 114/55 (10/18 0455) SpO2:  [96 %-99 %] 96 % (10/18 0455) Weight:  [190 lb 3.2 oz (86.3 kg)] 190 lb 3.2 oz (86.3 kg) (10/18 0455) Physical Exam: General: awake and alert, NAD, laying in bed  Cardiovascular: RRR, no MRG Respiratory: slight crackles at bases bilaterally  Abdomen: soft, non tender, non distended, bowel sounds x 4 quadrants  Extremities: no edema, slight tenderness in calves bilaterally, negative Homan's sign bilaterally, r thigh tenderess Derm: bilateral petechial rash on the toes - chronic improving per patient  Laboratory:  Recent Labs Lab  12/03/16 0134 12/04/16 0405  WBC 10.6* 9.7  HGB 11.9* 11.9*  HCT 37.0 38.2  PLT 289 262    Recent Labs Lab 12/03/16 0134 12/04/16 0405 12/05/16 0255  NA 134* 139 138  K 4.0 3.8 3.8   CL 100* 105 106  CO2 26 26 26   BUN 17 10 10   CREATININE 0.71 0.52 0.59  CALCIUM 8.9 8.8* 8.6*  PROT  --  5.8*  --   BILITOT  --  0.4  --   ALKPHOS  --  137*  --   ALT  --  43  --   AST  --  31  --   GLUCOSE 126* 87 94    Ref. Range 12/04/2016 04:05  Heparin Unfractionated Latest Ref Range: 0.30 - 0.70 IU/mL 0.63    Imaging/Diagnostic Tests: Dg Chest 2 View  Result Date: 12/03/2016 CLINICAL DATA:  Central chest pain. History of gastric bypass. Vomiting tonight. EXAM: CHEST  2 VIEW COMPARISON:  04/02/2016 FINDINGS: Shallow inspiration. Normal heart size and pulmonary vascularity. No focal airspace disease or consolidation in the lungs. No blunting of costophrenic angles. No pneumothorax. Mediastinal contours appear intact. Degenerative changes in the spine. Postoperative changes in the right shoulder and left upper quadrant. IMPRESSION: No active cardiopulmonary disease. Electronically Signed   By: Lucienne Capers M.D.   On: 12/03/2016 02:16   Ct Angio Chest Pe W And/or Wo Contrast  Result Date: 12/03/2016 CLINICAL DATA:  Chest pain woke the patient up last night. Pain radiates to the back and the jaw. EXAM: CT ANGIOGRAPHY CHEST WITH CONTRAST TECHNIQUE: Multidetector CT imaging of the chest was performed using the standard protocol during bolus administration of intravenous contrast. Multiplanar CT image reconstructions and MIPs were obtained to evaluate the vascular anatomy. CONTRAST:  55 mL Isovue 370 COMPARISON:  04/02/2016 FINDINGS: Cardiovascular: Filling defects are demonstrated in left lower lobe subsegmental pulmonary arteries consistent with pulmonary embolus. RA acute RV ratio measures 1.294. This may indicate changes of right heart strain, however, the clot burden does not seem to be very severe. Mild cardiac enlargement. No pericardial effusion. Mediastinum/Nodes: No significant lymphadenopathy in the chest. Esophagus is decompressed. Small esophageal hiatal hernia.  Lungs/Pleura: Linear atelectasis in the lung bases. No consolidation or airspace disease. No pleural effusions. No pneumothorax. Airways are patent. Scattered right pulmonary nodules. Largest is in the right middle lobe and measures about 5.5 mm diameter. No change since previous study. Upper Abdomen: Postoperative changes likely representing gastric bypass. Postoperative cholecystectomy. Musculoskeletal: Mild degenerative changes in the spine. No destructive bone lesions. Review of the MIP images confirms the above findings. IMPRESSION: Positive for acute PE with CT evidence of right heart strain (RV/LV Ratio = 1.294) consistent with at least submassive (intermediate risk) PE. The presence of right heart strain has been associated with an increased risk of morbidity and mortality. Please activate Code PE by paging [phone]. Unchanged appearance of right pulmonary nodules. These results were called by telephone at the time of interpretation on 12/03/2016 at 6:16 am to PA. Montine Circle , who verbally acknowledged these results. Electronically Signed   By: Lucienne Capers M.D.   On: 12/03/2016 06:19     Sherene Sires, DO 12/05/2016, 9:54 AM PGY-1, [place] Intern pager: [phone], text pages welcome

## 2016-12-06 LAB — BETA-2-GLYCOPROTEIN I ABS, IGG/M/A
Beta-2 Glyco I IgG: <9 GPI IgG units (ref 0–20)
Beta-2-Glycoprotein I IgM: <9 GPI IgM units (ref 0–32)

## 2016-12-06 LAB — CARDIOLIPIN ANTIBODIES, IGG, IGM, IGA

## 2016-12-06 NOTE — Discharge Summary (Signed)
[facility] Discharge Summary  Patient name: [name] Medical record number: [id number] Date of birth: [birth year] Age: 66 y.o. Gender: female Date of Admission: 12/03/2016  Date of Discharge: 12/05/2016 Admitting Physician: Jacqueline Resides, MD  Primary Care Provider: [name], MD Consultants: Hematology   Indication for Hospitalization: Submassive PE  Discharge Diagnoses/Problem List:  Chest pain likely 2/2 acute submassive PE Collagenous colitis Vasculitis, unspecific Rheumatoid Arthritis HTN Seasonal Allergies GAD  Disposition: to home  Discharge Condition: stable, improving   Discharge Exam:  See progress note from date of discharge  Brief Hospital Course:  [name] is a 66 y.o. female presenting with chest pain and seen to have submassive PE on CTA. Chest pain was sudden on onset and was not relieved with both tums and nitroglycerin prior to arrival to ED. Patient has family history of blood clots and a PMHx of vasculitis, causing a risk of VTE. EKG on admission did not show acute ST/T wave changes. Patient was started on heparin gtt and hematology was consulted for assistance with anticoagulation given history of vasculitis. Inpatient hematology recommended transitioning to oral anticoagulation using normal protocol for patient with PE, with no alterations for history of vasculitis. Recommended either xarelto or eliquis for oral anticoagulation with outpatient follow up with hematology. Patient was transitioned to eliquis for oral anticoagulation. Patient maintained adequate oxygenation on room air during admission. Patient was stable and agreeable to discharge.   Issues for Follow Up:  1. Patient started on oral anticoagulation therapy with Eliquis: start with two-5mg  tablets twice daily for 7 days. On day 8, switch to one-5mg  tablet twice daily. 2. Will need outpatient follow up with hematology - should determine further  anticoagulation therapy  3. Follow up on hypercoagulability labs per hematology 4. Follow up with dermatology for vascultitis  5. ECHO on 10/17 showed G1DD, minimal mitral regurg.   Significant Procedures: none  Significant Labs and Imaging:   Recent Labs Lab 12/03/16 0134 12/04/16 0405  WBC 10.6* 9.7  HGB 11.9* 11.9*  HCT 37.0 38.2  PLT 289 262    Recent Labs Lab 12/03/16 0134 12/04/16 0405 12/05/16 0255  NA 134* 139 138  K 4.0 3.8 3.8  CL 100* 105 106  CO2 26 26 26   GLUCOSE 126* 87 94  BUN 17 10 10   CREATININE 0.71 0.52 0.59  CALCIUM 8.9 8.8* 8.6*  ALKPHOS  --  137*  --   AST  --  31  --   ALT  --  43  --   ALBUMIN  --  3.0*  --     Ref. Range 12/04/2016 09:44  Homocysteine Latest Ref Range: 0.0 - 15.0 umol/L 8.5    Ref. Range 12/04/2016 09:31  PTT Lupus Anticoagulant Latest Ref Range: 0.0 - 51.9 sec 29.5  DRVVT Latest Ref Range: 0.0 - 47.0 sec 27.8    Ref. Range 12/04/2016 09:31  Antithrombin Activity Latest Ref Range: 75 - 120 % 106    Ref. Range 12/04/2016 09:31  PTT Lupus Anticoagulant Latest Ref Range: 0.0 - 51.9 sec 29.5  DRVVT Latest Ref Range: 0.0 - 47.0 sec 27.8    Ref. Range 12/04/2016 09:31  Protein C-Functional Latest Ref Range: 73 - 180 % 172  Protein C, Total Latest Ref Range: 60 - 150 % 161 (H)    Ref. Range 12/04/2016 09:31  Protein S-Functional Latest Ref Range: 63 - 140 % 85  Protein S, Total Latest Ref Range: 60 - 150 %  69    Ref. Range 12/04/2016 09:31  PTT Lupus Anticoagulant Latest Ref Range: 0.0 - 51.9 sec 29.5   Dg Chest 2 View  Result Date: 12/03/2016 CLINICAL DATA:  Central chest pain. History of gastric bypass. Vomiting tonight. EXAM: CHEST  2 VIEW COMPARISON:  04/02/2016 FINDINGS: Shallow inspiration. Normal heart size and pulmonary vascularity. No focal airspace disease or consolidation in the lungs. No blunting of costophrenic angles. No pneumothorax. Mediastinal contours appear intact. Degenerative changes in the spine.  Postoperative changes in the right shoulder and left upper quadrant. IMPRESSION: No active cardiopulmonary disease. Electronically Signed   By: [name] M.D.   On: 12/03/2016 02:16   Ct Angio Chest Pe W And/or Wo Contrast  Result Date: 12/03/2016 CLINICAL DATA:  Chest pain woke the patient up last night. Pain radiates to the back and the jaw. EXAM: CT ANGIOGRAPHY CHEST WITH CONTRAST TECHNIQUE: Multidetector CT imaging of the chest was performed using the standard protocol during bolus administration of intravenous contrast. Multiplanar CT image reconstructions and MIPs were obtained to evaluate the vascular anatomy. CONTRAST:  55 mL Isovue 370 COMPARISON:  04/02/2016 FINDINGS: Cardiovascular: Filling defects are demonstrated in left lower lobe subsegmental pulmonary arteries consistent with pulmonary embolus. RA acute RV ratio measures 1.294. This may indicate changes of right heart strain, however, the clot burden does not seem to be very severe. Mild cardiac enlargement. No pericardial effusion. Mediastinum/Nodes: No significant lymphadenopathy in the chest. Esophagus is decompressed. Small esophageal hiatal hernia. Lungs/Pleura: Linear atelectasis in the lung bases. No consolidation or airspace disease. No pleural effusions. No pneumothorax. Airways are patent. Scattered right pulmonary nodules. Largest is in the right middle lobe and measures about 5.5 mm diameter. No change since previous study. Upper Abdomen: Postoperative changes likely representing gastric bypass. Postoperative cholecystectomy. Musculoskeletal: Mild degenerative changes in the spine. No destructive bone lesions. Review of the MIP images confirms the above findings. IMPRESSION: Positive for acute PE with CT evidence of right heart strain (RV/LV Ratio = 1.294) consistent with at least submassive (intermediate risk) PE. The presence of right heart strain has been associated with an increased risk of morbidity and mortality. Please  activate Code PE by paging [phone]. Unchanged appearance of right pulmonary nodules. These results were called by telephone at the time of interpretation on 12/03/2016 at 6:16 am to PA. [name] , who verbally acknowledged these results. Electronically Signed   By: [name] M.D.   On: 12/03/2016 06:19    Results/Tests Pending at Time of Discharge:  [facility]     Ordered   12/04/16 2585  Beta-2-glycoprotein i abs, IgG/M/A  (Hypercoagulable Panel, Comprehensive (PNL))  Once,   R    Question:  Specimen collection method  Answer:  Lab=Lab collect   12/04/16 0926   12/04/16 2778  Factor 5 leiden  (Hypercoagulable Panel, Comprehensive (PNL))  Once,   R    Question:  Specimen collection method  Answer:  Lab=Lab collect   12/04/16 0926   12/04/16 2423  Prothrombin gene mutation  (Hypercoagulable Panel, Comprehensive (PNL))  Once,   R    Question:  Specimen collection method  Answer:  Lab=Lab collect   12/04/16 0926   12/04/16 0927  Cardiolipin antibodies, IgG, IgM, IgA  (Hypercoagulable Panel, Comprehensive (PNL))  Once,   R    Question:  Specimen collection method  Answer:  Lab=Lab collect   12/04/16 0926     Discharge Medications:  Allergies as of 12/05/2016  Reactions   Vioxx [rofecoxib] Swelling   EXTREME LEG SWELLING    Augmentin [amoxicillin-pot Clavulanate] Diarrhea   Bactrim [sulfamethoxazole-trimethoprim] Diarrhea, Nausea And Vomiting   Banana Itching, Rash   Cephalosporins Diarrhea, Nausea And Vomiting   Ciprofloxacin Diarrhea, Nausea And Vomiting   Coconut Flavor Itching, Rash   Codeine Nausea Only   Keflex [cephalexin] Diarrhea   DOSES > 250 MG RESULTS IN ABD PAIN   Zithromax [azithromycin] Diarrhea, Nausea And Vomiting      Medication List    STOP taking these medications   acetaminophen 500 MG tablet Commonly known as:  TYLENOL     TAKE these medications   ALPRAZolam 1 MG tablet Commonly known as:  XANAX Take 1 tablet (1 mg  total) by mouth at bedtime as needed for sleep. What changed:  medication strength  See the new instructions.   benazepril 20 MG tablet Commonly known as:  LOTENSIN Take 1 tablet (20 mg total) by mouth daily.   budesonide 3 MG 24 hr capsule Commonly known as:  ENTOCORT EC Take 9 mg by mouth every morning.   cetirizine 10 MG tablet Commonly known as:  ZYRTEC Take 10 mg by mouth daily.   diclofenac 75 MG EC tablet Commonly known as:  VOLTAREN TAKE 1 TABLET BY MOUTH 2 TIMES DAILY AS NEEDED FOR JOINT PAIN What changed:  how much to take  how to take this  when to take this  additional instructions   diphenoxylate-atropine 2.5-0.025 MG tablet Commonly known as:  LOMOTIL Take 2 tablets by mouth 4 (four) times daily as needed for diarrhea or loose stools.   ELIQUIS STARTER PACK 5 MG Tabs Take as directed on package: start with two-5mg  tablets twice daily for 7 days. On day 8, switch to one-5mg  tablet twice daily.   esomeprazole 20 MG capsule Commonly known as:  NEXIUM Take 1 capsule (20 mg total) by mouth daily.   fluconazole 100 MG tablet Commonly known as:  DIFLUCAN TAKE 1 TABLET BY MOUTH EVERY OTHER DAYS FOR 7 DOSES, THEN TAKE 1 TABLET ONCE WEEKLY THEREAFTER   fluticasone 50 MCG/ACT nasal spray Commonly known as:  FLONASE Place 2 sprays into both nostrils daily as needed for allergies.   furosemide 20 MG tablet Commonly known as:  LASIX TAKE 1 OR 2 TABLETS BY MOUTH DAILY FOR LOWER EXTREMITY SWELLING What changed:  how much to take  how to take this  when to take this  additional instructions   ibuprofen 200 MG tablet Commonly known as:  ADVIL,MOTRIN Take 400 mg by mouth every 6 (six) hours as needed for mild pain.   oxyCODONE-acetaminophen 5-325 MG tablet Commonly known as:  ROXICET Take 1-2 tablets by mouth every 4 (four) hours as needed for severe pain.   predniSONE 10 MG tablet Commonly known as:  DELTASONE Take 5-10 mg by mouth See admin  instructions. Take 10 mg 10/16 & 10/17. Take 5 mg for the next four days   valACYclovir 500 MG tablet Commonly known as:  VALTREX Take 1,000 mg by mouth daily as needed (outbreaks). For 3 days as needed   venlafaxine XR 150 MG 24 hr capsule Commonly known as:  EFFEXOR-XR Take 1 capsule (150 mg total) by mouth daily. What changed:  when to take this  additional instructions   venlafaxine XR 75 MG 24 hr capsule Commonly known as:  EFFEXOR-XR TAKE 1 CAPSULE BY MOUTH ONCE DAILY (IN ADDITION TO 150MG  CAPSULE FOR MAX OF 225MG  PER DAY) What changed:  how much to take  how to take this  when to take this  additional instructions   vitamin B-12 1000 MCG tablet Commonly known as:  CYANOCOBALAMIN Take 1,000 mcg by mouth daily.   Vitamin D3 1000 units Caps Take 1,000 Units by mouth daily.       Discharge Instructions: Please refer to Patient Instructions section of EMR for full details.  Patient was counseled important signs and symptoms that should prompt return to medical care, changes in medications, dietary instructions, activity restrictions, and follow up appointments.   Follow-Up Appointments: Follow-up Information    Nicolette Bang, DO. Go on 12/12/2016.   Specialty:  Family Medicine Why:  @ 2:30 pm (please arrive 15 min early) Contact information: [place] [address] [phone]           Caroline More, DO 12/06/2016, 1:53 AM PGY-1, [place]

## 2016-12-09 LAB — FACTOR 5 LEIDEN

## 2016-12-10 LAB — PROTHROMBIN GENE MUTATION

## 2016-12-12 ENCOUNTER — Encounter: Payer: Self-pay | Admitting: Internal Medicine

## 2016-12-12 ENCOUNTER — Ambulatory Visit (INDEPENDENT_AMBULATORY_CARE_PROVIDER_SITE_OTHER): Payer: BLUE CROSS/BLUE SHIELD | Admitting: Internal Medicine

## 2016-12-12 VITALS — BP 116/68 | HR 72 | Temp 98.0°F | Ht 59.0 in | Wt 190.2 lb

## 2016-12-12 DIAGNOSIS — Z09 Encounter for follow-up examination after completed treatment for conditions other than malignant neoplasm: Secondary | ICD-10-CM

## 2016-12-12 DIAGNOSIS — I2699 Other pulmonary embolism without acute cor pulmonale: Secondary | ICD-10-CM | POA: Diagnosis not present

## 2016-12-12 NOTE — Progress Notes (Signed)
   Subjective:    [name] - 66 y.o. female MRN [id number]  Date of birth: 1951/02/02  HPI  [name] is here for hospital follow up. Hospitalized from 10/16 to 10/18 for submassive PE. Patient was discharged with Eliquis. Tomorrow she will transition to 5 mg BID dosing after completing 7 days of 10 mg BID. She reports no problems with medication. She denies SOB, chest pain, and LE edema.   -  reports that she has never smoked. She has never used smokeless tobacco. - Review of Systems: Per HPI. - Past Medical History: Patient Active Problem List   Diagnosis Date Noted  . GAD (generalized anxiety disorder)   . PE (pulmonary thromboembolism) ([place]) 12/03/2016  . Acute pulmonary embolism ([place]) 12/03/2016  . S/P shoulder replacement, right 06/28/2016  . Vitamin D deficiency 12/05/2015  . Vitamin B 12 deficiency 12/05/2015  . H/O TB skin testing 12/05/2015  . AC (acromioclavicular) joint arthritis 08/29/2015  . Otitis media, acute suppurative 11/25/2014  . Well adult health check 09/01/2013  . Hammertoe 11/20/2012  . Psoriatic arthritis ([place]) 04/10/2012  . Pulmonary nodule seen on imaging study 11/08/2011  . Dysthymia 12/12/2010  . Pain in joint, shoulder region 10/03/2008  . Essential hypertension 04/27/2008  . Palpitations 10/30/2007  . METATARSALGIA 10/02/2006  . KNEE REPLACEMENT, LEFT, HX OF 07/03/2006  . OBESITY, NOS 04/17/2006  . PANIC ATTACKS 04/17/2006  . GASTROESOPHAGEAL REFLUX, NO ESOPHAGITIS 04/17/2006  . CHOLELITHIASIS, NOS 04/17/2006  . VAGINITIS, ATROPHIC 04/17/2006  . ARTHRALGIA, UNSPECIFIED 04/17/2006   - Medications: reviewed and updated   Objective:   Physical Exam BP 116/68   Pulse 72   Temp 98 F (36.7 C) (Oral)   Ht 4\' 11"  (1.499 m)   Wt 190 lb 3.2 oz (86.3 kg)   SpO2 97%   BMI 38.42 kg/m  Gen: NAD, alert, cooperative with exam, well-appearing CV: RRR, good S1/S2, no murmur, no edema Resp: CTABL, no wheezes, non-labored    Assessment &  Plan:   PE (pulmonary thromboembolism) ([place]) Patient currently asymptomatic with stable vital signs. Reports compliance with Eliquis without AEs. Initial hypercoagulable labs normal. Have placed referral to heme/onc for recommendations for duration of anticoagulation therapy as well as further work up if needed especially given history of vasculitis. PE appears to have been uprovoked so anticipate treatment for at least 6 months. Follow up with PCP.     Phill Myron, D.O. 12/12/2016, 4:39 PM PGY-3, [place]

## 2016-12-12 NOTE — Assessment & Plan Note (Signed)
Patient currently asymptomatic with stable vital signs. Reports compliance with Eliquis without AEs. Initial hypercoagulable labs normal. Have placed referral to heme/onc for recommendations for duration of anticoagulation therapy as well as further work up if needed especially given history of vasculitis. PE appears to have been uprovoked so anticipate treatment for at least 6 months. Follow up with PCP.

## 2016-12-12 NOTE — Patient Instructions (Signed)
I have placed the referral to hematology. You should get a call in the next couple of weeks about scheduling an appointment. Those doctors will help determine how long you need to be on Eliquis for and if there needs to be any additional blood clotting work up. Follow up with Dr. Nori Riis as needed.

## 2016-12-18 ENCOUNTER — Ambulatory Visit
Admission: RE | Admit: 2016-12-18 | Discharge: 2016-12-18 | Disposition: A | Discharge status: Home / Self Care | Payer: BLUE CROSS/BLUE SHIELD | Source: Ambulatory Visit | Attending: Family Medicine | Admitting: Family Medicine

## 2016-12-18 DIAGNOSIS — Z1231 Encounter for screening mammogram for malignant neoplasm of breast: Secondary | ICD-10-CM

## 2016-12-19 ENCOUNTER — Ambulatory Visit: Payer: BLUE CROSS/BLUE SHIELD

## 2016-12-23 ENCOUNTER — Inpatient Hospital Stay: Admit: 2016-12-23 | Payer: BLUE CROSS/BLUE SHIELD | Admitting: Orthopedic Surgery

## 2016-12-23 SURGERY — ARTHROPLASTY, KNEE, TOTAL
Anesthesia: Choice | Site: Knee | Laterality: Right

## 2016-12-26 ENCOUNTER — Telehealth: Payer: Self-pay | Admitting: *Deleted

## 2016-12-26 NOTE — Telephone Encounter (Signed)
Patient left message on nurse line requesting refill on eliquis, states she only has a week left on her starter pack.

## 2016-12-30 ENCOUNTER — Encounter: Payer: Self-pay | Admitting: Family Medicine

## 2016-12-30 NOTE — Telephone Encounter (Signed)
Pt calling to check the status. [name], Salome Spotted, CMA

## 2016-12-31 MED ORDER — APIXABAN 5 MG PO TABS: 5.0000 mg | ORAL_TABLET | Freq: Two times a day (BID) | ORAL | 12 refills | Status: DC

## 2017-01-01 ENCOUNTER — Telehealth: Payer: Self-pay | Admitting: Hematology and Oncology

## 2017-01-01 ENCOUNTER — Encounter: Payer: Self-pay | Admitting: Hematology and Oncology

## 2017-01-01 NOTE — Telephone Encounter (Signed)
Hem appt has been scheduled for the pt to see Dr. Lebron Conners on 11/27 at 8am. Pt aware to arrive 15 minutes early. Letter mailed.

## 2017-01-14 ENCOUNTER — Ambulatory Visit (HOSPITAL_BASED_OUTPATIENT_CLINIC_OR_DEPARTMENT_OTHER): Payer: BLUE CROSS/BLUE SHIELD | Admitting: Hematology and Oncology

## 2017-01-14 ENCOUNTER — Telehealth: Payer: Self-pay | Admitting: Hematology and Oncology

## 2017-01-14 ENCOUNTER — Encounter: Payer: Self-pay | Admitting: Hematology and Oncology

## 2017-01-14 VITALS — BP 120/65 | HR 69 | Temp 98.1°F | Resp 18 | Ht 59.0 in | Wt 194.0 lb

## 2017-01-14 DIAGNOSIS — Z7901 Long term (current) use of anticoagulants: Secondary | ICD-10-CM | POA: Diagnosis not present

## 2017-01-14 DIAGNOSIS — I2699 Other pulmonary embolism without acute cor pulmonale: Secondary | ICD-10-CM | POA: Diagnosis not present

## 2017-01-14 NOTE — Telephone Encounter (Signed)
Appt scheduled per 11/27 los - Gave patient AVS and calender per los.

## 2017-01-16 ENCOUNTER — Ambulatory Visit (HOSPITAL_COMMUNITY)
Admission: RE | Admit: 2017-01-16 | Discharge: 2017-01-16 | Disposition: A | Discharge status: Home / Self Care | Payer: BLUE CROSS/BLUE SHIELD | Source: Ambulatory Visit | Attending: Hematology and Oncology | Admitting: Hematology and Oncology

## 2017-01-16 ENCOUNTER — Telehealth: Payer: Self-pay

## 2017-01-16 DIAGNOSIS — Z86711 Personal history of pulmonary embolism: Secondary | ICD-10-CM | POA: Insufficient documentation

## 2017-01-16 DIAGNOSIS — R9389 Abnormal findings on diagnostic imaging of other specified body structures: Secondary | ICD-10-CM | POA: Diagnosis not present

## 2017-01-16 DIAGNOSIS — I2699 Other pulmonary embolism without acute cor pulmonale: Secondary | ICD-10-CM

## 2017-01-16 NOTE — Telephone Encounter (Signed)
Dr. Lebron Conners made aware of venous doppler results. Negative for DVT per call from vascular lab.

## 2017-01-16 NOTE — Progress Notes (Signed)
*  PRELIMINARY RESULTS* Vascular Ultrasound Bilateral Upper and Lower extremity venous duplex has been completed.  Preliminary findings: No evidence of deep or superficial thrombosis in the bilateral upper or lower extremities.  Right sided bakers cyst noted.  Preliminary results called to Dr. Clydene Laming office, given to [facility] @ 10:00.  Myrtie Cruise Meleah Demeyer 01/16/2017, 10:06 AM

## 2017-01-21 ENCOUNTER — Ambulatory Visit: Payer: BLUE CROSS/BLUE SHIELD | Admitting: Hematology and Oncology

## 2017-01-21 ENCOUNTER — Encounter: Payer: Self-pay | Admitting: Hematology and Oncology

## 2017-01-21 ENCOUNTER — Ambulatory Visit (HOSPITAL_BASED_OUTPATIENT_CLINIC_OR_DEPARTMENT_OTHER): Payer: BLUE CROSS/BLUE SHIELD | Admitting: Hematology and Oncology

## 2017-01-21 VITALS — BP 125/70 | HR 83 | Temp 97.8°F | Resp 20 | Wt 193.0 lb

## 2017-01-21 DIAGNOSIS — Z7901 Long term (current) use of anticoagulants: Secondary | ICD-10-CM | POA: Diagnosis not present

## 2017-01-21 DIAGNOSIS — I2699 Other pulmonary embolism without acute cor pulmonale: Secondary | ICD-10-CM | POA: Diagnosis not present

## 2017-01-22 ENCOUNTER — Telehealth: Payer: Self-pay | Admitting: Hematology and Oncology

## 2017-01-22 NOTE — Telephone Encounter (Signed)
Spoke to patient regarding upcoming April appointments.

## 2017-02-06 ENCOUNTER — Other Ambulatory Visit: Payer: Self-pay | Admitting: Family Medicine

## 2017-02-07 ENCOUNTER — Other Ambulatory Visit: Payer: Self-pay | Admitting: Family Medicine

## 2017-02-17 NOTE — Progress Notes (Signed)
[facility] New Visit:  Assessment: PE (pulmonary thromboembolism) ([place]) 66 y.o. female with recent apparently unprovoked pulmonary embolism with evidence of right ventricular strain by imaging, currently anticoagulated with apixaban (Eliquis). 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 At the present time, patient is undergoing suppressive therapy including rituximab.   On presentation With pulmonary embolism, no imaging of the peripheral vasculature was obtained to assess for deepening thrombosis.lab work demonstrated no indefatigable abnormalities to suggest presence of antiphospholipid antibody syndrome, antithrombin, protein C, or protein S deficiency. Patient also tested negative for presence of prothrombin gene or factor five Leiden mutations.  At this time, etiology of the thrombosis remains unclear.  Plan: -- obtain Doppler ultrasounds of all four extremities to assess for possible presence of deep vein thrombosis. --continue apixaban (Eliquis) for at least six months, potentially longer if well-tolerated by the patient. -- Return to clinic in one week to review the ultrasound findings.   Voice recognition software was used and creation of this note. Despite my best effort at editing the text, some misspelling/errors may have occurred.   No orders of the defined types were placed in this encounter.   All questions were answered.  . The patient knows to call the clinic with any problems, questions or concerns.  This note was electronically signed.    History of Presenting Illness [name] 66 y.o. presenting to the [facility] for History of pulmonary embolism, referred by Dr Phill Myron.  Patient's past medical history is positive for generalized anxiety disorder, history of B12 and vitamin D deficiency, psoriatic arthritis or seronegative rheumatoid arthritis, hypertension, GERD, history of leukocytoclastic vasculitis in the lower extremities. Family history significant father dying from Venous thromboembolism.   The present time, patient denies any fever, chills, night sweats. Unexpected weight changes. Patient has had stomach stapling in the past as part of weight-loss program. She denies any shortness of breath interest, but continues to haven't written left-sided chest pains including at rest.No significant nausea, vomiting, abdominal pain, or diarrhea at this time. No hematochezia or melena. Patient denies dysuria or hematuria. Taking Apixaban (Eliquis) as prescribed without significant difficulties.  Oncological/hematological History: --Admission, 10/16-18/18: Patient reports developing progressive gastrointestinal symptoms since the end of Jul 2018 including persistent diarrhea resulting in decreased mobility. Patient also reports vasculitis flair and gout attacks over the past three years. In the past, patient have colonoscopy demonstrating collagenous colitis, previously treated with mesalamine without significant improvement. On October 5, patient underwent skin biopsy secondary to suspicion for vasculitis. Skin biopsy demonstrated vasculitis deemed unrelated due to gastrointestinal findings. Microvascular clotting was detected on biopsy. While he kept undergoing the biopsy, patient woke up at night with significant chest pain. Initially, Triton taxes with out relief. Subsequently tried nitroglycerin which did not help either. Eventually, patient presented to the emergency room where evaluation tech pulmonary embolism.  --CTA Chest, 12/03/16: Filling defects demonstrated in the left lower lobe in subsegmental pulmonary arteries consistent with pulmonary emboli. RA/RV ratio 1.294,  Consistent with possible right heart strain.  --Labs, 12/04/16: DRVVT 27.8, AT 106%, PrC 172%, PrC Ag 161%, PrS 85%, PrS Ag 69%, Homocysteine 8.5  --Labs, 12/12/16: DRVVT, Anticardiolipin Ab, beta-2 Glycoprotein Ab -- negative; C3 126, C4 23, CH50 >60, ANA -- negative  --Treatment:    --  Heparin gtt, 10/16-18/18   -- Apixaban (Eliquis), 12/05/16-:   Medical History: Past Medical History:  Diagnosis Date  . Anemia   . Anxiety   . Arthritis   . Depression   . Dysrhythmia    occ due to caffeine intake  . Encounter for long-term (current) use of other medications   . GERD (gastroesophageal reflux disease)   . Hepatitis   . History of hiatal hernia   . Hypertension   . IBS (irritable bowel syndrome)   . Obesity   . Osteoarthritis   . Pain in joint, multiple sites   . Pneumonia   . PONV (postoperative nausea and vomiting)    in the 1980's no problems since  . Psoriatic arthropathy ([place])   . Pulmonary embolism ([place]) 12/03/2016   submassive/notes 12/03/2016  . Raynaud disease     Surgical History: Past Surgical History:  Procedure Laterality Date  . ABDOMINAL HYSTERECTOMY    . COLONOSCOPY    . GASTRIC BYPASS    . HALLUX VALGUS CORRECTION  09/2010   Dr Arnetha Courser; right 1st ray  . HERNIA REPAIR    . JOINT REPLACEMENT     left knee times 3  . LAPAROSCOPIC CHOLECYSTECTOMY  04/2000   Archie Endo 07/03/2010  . LAPAROSCOPIC INCISIONAL / UMBILICAL / VENTRAL HERNIA REPAIR  09/2002   VHR/notes 07/03/2010  . REVISION TOTAL KNEE ARTHROPLASTY Left 10/2003   Archie Endo 07/03/2010  . REVISION TOTAL KNEE ARTHROPLASTY Left 02/2007   Archie Endo 06/21/2010  . SHOULDER HEMI-ARTHROPLASTY Right 06/28/2016   Procedure: RIGHT SHOULDER HEMI-ARTHROPLASTY;  Surgeon: Netta Cedars, MD;  Location: [facility];  Service: Orthopedics;  Laterality: Right;  . TOTAL KNEE ARTHROPLASTY Left 10/1999   Archie Endo 07/03/2010  . TUBAL LIGATION      Family History: Family History  Problem Relation Age of Onset  . Depression Mother    . Deep vein thrombosis Father     Social History: Social History   Socioeconomic History  . Marital status: Married    Spouse name: Not on file  . Number of children: Not on file  . Years of education: Not on file  . Highest education level: Not on file  Social Needs  . Financial resource strain: Not on file  . Food insecurity - worry: Not on file  . Food insecurity - inability: Not on file  . Transportation needs - medical: Not on file  . Transportation needs - non-medical: Not on file  Occupational History  . Not on file  Tobacco Use  . Smoking status: Never Smoker  . Smokeless tobacco: Never Used  Substance and Sexual Activity  . Alcohol use: No  . Drug use: No  . Sexual activity: Not on file  Other Topics Concern  . Not on file  Social History Narrative  . Not on file    Allergies: Allergies  Allergen Reactions  . Vioxx [Rofecoxib] Swelling    EXTREME LEG SWELLING   . Augmentin [Amoxicillin-Pot Clavulanate] Diarrhea  . Bactrim [Sulfamethoxazole-Trimethoprim] Diarrhea and Nausea And Vomiting  . Banana Itching and Rash  . Cephalosporins Diarrhea and Nausea And Vomiting  . Ciprofloxacin Diarrhea and Nausea And Vomiting  . Coconut Flavor Itching and Rash  . Codeine Nausea Only  . Keflex [Cephalexin] Diarrhea    DOSES > 250 MG RESULTS IN ABD PAIN  . Zithromax [Azithromycin] Diarrhea and Nausea And Vomiting    Medications:  Current Outpatient Medications  Medication Sig Dispense Refill  . ALPRAZolam (XANAX) 1 MG tablet Take 1 tablet (1  mg total) by mouth at bedtime as needed for sleep. 30 tablet 0  . apixaban (ELIQUIS) 5 MG TABS tablet Take 1 tablet (5 mg total) 2 (two) times daily by mouth. 60 tablet 12  . benazepril (LOTENSIN) 20 MG tablet TAKE 1 TABLET(20 MG) BY MOUTH DAILY 90 tablet 3  . budesonide (ENTOCORT EC) 3 MG 24 hr capsule Take 9 mg by mouth every morning.    . cetirizine (ZYRTEC) 10 MG tablet Take 10 mg by mouth daily.    . Cholecalciferol  (VITAMIN D3) 1000 units CAPS Take 1,000 Units by mouth daily.    . diclofenac (VOLTAREN) 75 MG EC tablet TAKE 1 TABLET BY MOUTH TWICE DAILY AS NEEDED FOR JOINT PAIN 180 tablet 3  . diphenoxylate-atropine (LOMOTIL) 2.5-0.025 MG tablet Take 2 tablets by mouth 4 (four) times daily as needed for diarrhea or loose stools. (Patient not taking: Reported on 01/21/2017) 30 tablet 0  . esomeprazole (NEXIUM) 20 MG capsule TAKE 1 CAPSULE(20 MG) BY MOUTH DAILY 90 capsule 0  . fluconazole (DIFLUCAN) 100 MG tablet TAKE 1 TABLET BY MOUTH ONCE DAILY FOR 7 DOSES THEN 1 TABLET BY MOUTH ONCE WEEKLY 21 tablet 3  . fluticasone (FLONASE) 50 MCG/ACT nasal spray Place 2 sprays into both nostrils daily as needed for allergies.  1  . furosemide (LASIX) 20 MG tablet TAKE 1 OR 2 TABLETS BY MOUTH DAILY FOR LOWER EXTREMITY SWELLING (Patient taking differently: Take 20-40 mg by mouth daily. TAKE 1 OR 2 TABLETS BY MOUTH DAILY FOR LOWER EXTREMITY SWELLING  DEPENDS ON AMOUNT OF SWELLING IF TAKES 1 OR 2 TABLETS) 180 tablet 3  . ibuprofen (ADVIL,MOTRIN) 200 MG tablet Take 400 mg by mouth every 6 (six) hours as needed for mild pain.    Marland Kitchen oxyCODONE-acetaminophen (ROXICET) 5-325 MG tablet Take 1-2 tablets by mouth every 4 (four) hours as needed for severe pain. (Patient not taking: Reported on 01/21/2017) 40 tablet 0  . predniSONE (DELTASONE) 10 MG tablet Take 5-10 mg by mouth See admin instructions. Take 10 mg 10/16 & 10/17. Take 5 mg for the next four days    . valACYclovir (VALTREX) 500 MG tablet Take 1,000 mg by mouth daily as needed (outbreaks). For 3 days as needed  98  . venlafaxine XR (EFFEXOR-XR) 150 MG 24 hr capsule TAKE 1 CAPSULE(150 MG) BY MOUTH DAILY 90 capsule 3  . venlafaxine XR (EFFEXOR-XR) 75 MG 24 hr capsule TAKE 1 CAPSULE BY MOUTH EVERY DAY IN ADDITION TO 150 MG CAPSULE FOR. MAX OF 225 MG PER DAY 90 capsule 3  . vitamin B-12 (CYANOCOBALAMIN) 1000 MCG tablet Take 1,000 mcg by mouth daily.     No current  facility-administered medications for this visit.     Review of Systems: Review of Systems  Constitutional: Negative for appetite change, chills, diaphoresis, fatigue and fever.  Respiratory: Positive for chest tightness and shortness of breath. Negative for hemoptysis.   All other systems reviewed and are negative.    PHYSICAL EXAMINATION Blood pressure 120/65, pulse 69, temperature 98.1 F (36.7 C), temperature source Oral, resp. rate 18, height _0  (1.499 m), weight 194 lb (88 kg), SpO2 100 %.  ECOG PERFORMANCE STATUS: 2 - Symptomatic, <50% confined to bed  Physical Exam  Constitutional: She is oriented to person, place, and time and well-developed, well-nourished, and in no distress. No distress.  HENT:  Head: Normocephalic and atraumatic.  Mouth/Throat: Oropharynx is clear and moist. No oropharyngeal exudate.  Eyes: Conjunctivae and EOM are normal. Pupils  are equal, round, and reactive to light. No scleral icterus.  Neck: No thyromegaly present.  Cardiovascular: Normal rate, regular rhythm and normal heart sounds.  No murmur heard. Pulmonary/Chest: Effort normal and breath sounds normal. No respiratory distress. She has no wheezes. She has no rales.  Abdominal: Soft. Bowel sounds are normal. She exhibits no distension. There is no tenderness. There is no rebound and no guarding.  Musculoskeletal: She exhibits edema. She exhibits no tenderness.  Bilateral trace lower extremity edema.  Lymphadenopathy:    She has no cervical adenopathy.  Neurological: She is alert and oriented to person, place, and time. She has normal reflexes. No cranial nerve deficit.  Skin: Skin is warm and dry. No rash noted. She is not diaphoretic. No erythema. No pallor.     LABORATORY DATA: I have personally reviewed the data as listed: No visits with results within 1 Week(s) from this visit.  Latest known visit with results is:  Admission on 12/03/2016, Discharged on 12/05/2016  Component Date  Value Ref Range Status  . Sodium 12/03/2016 134* 135 - 145 mmol/L Final  . Potassium 12/03/2016 4.0  3.5 - 5.1 mmol/L Final  . Chloride 12/03/2016 100* 101 - 111 mmol/L Final  . CO2 12/03/2016 26  22 - 32 mmol/L Final  . Glucose, Bld 12/03/2016 126* 65 - 99 mg/dL Final  . BUN 12/03/2016 17  6 - 20 mg/dL Final  . Creatinine, Ser 12/03/2016 0.71  0.44 - 1.00 mg/dL Final  . Calcium 12/03/2016 8.9  8.9 - 10.3 mg/dL Final  . GFR calc non Af Amer 12/03/2016 >60  >60 mL/min Final  . GFR calc Af Amer 12/03/2016 >60  >60 mL/min Final   Comment: (NOTE) The eGFR has been calculated using the CKD EPI equation. This calculation has not been validated in all clinical situations. eGFR's persistently <60 mL/min signify possible Chronic Kidney Disease.   . Anion gap 12/03/2016 8  5 - 15 Final  . WBC 12/03/2016 10.6* 4.0 - 10.5 K/uL Final  . RBC 12/03/2016 4.35  3.87 - 5.11 MIL/uL Final  . Hemoglobin 12/03/2016 11.9* 12.0 - 15.0 g/dL Final  . HCT 12/03/2016 37.0  36.0 - 46.0 % Final  . MCV 12/03/2016 85.1  78.0 - 100.0 fL Final  . MCH 12/03/2016 27.4  26.0 - 34.0 pg Final  . MCHC 12/03/2016 32.2  30.0 - 36.0 g/dL Final  . RDW 12/03/2016 13.9  11.5 - 15.5 % Final  . Platelets 12/03/2016 289  150 - 400 K/uL Final  . Troponin i, poc 12/03/2016 0.00  0.00 - 0.08 ng/mL Final  . Comment 3 12/03/2016          Final   Comment: Due to the release kinetics of cTnI, a negative result within the first hours of the onset of symptoms does not rule out myocardial infarction with certainty. If myocardial infarction is still suspected, repeat the test at appropriate intervals.   . Troponin i, poc 12/03/2016 0.00  0.00 - 0.08 ng/mL Final  . Comment 3 12/03/2016          Final   Comment: Due to the release kinetics of cTnI, a negative result within the first hours of the onset of symptoms does not rule out myocardial infarction with certainty. If myocardial infarction is still suspected, repeat the test at  appropriate intervals.   Marland Kitchen HIV Screen 4th Generation wRfx 12/03/2016 Non Reactive  Non Reactive Final   Comment: (NOTE) Performed At: [facility] [address]  [address] [place], [place] [id number] Lindon Romp MD QZ:[id number]   . Troponin I 12/03/2016 <0.03  <0.03 ng/mL Final  . Troponin I 12/03/2016 <0.03  <0.03 ng/mL Final  . Troponin I 12/03/2016 <0.03  <0.03 ng/mL Final  . Weight 12/04/2016 3,073.6  oz Final  . Height 12/04/2016 59  in Final  . BP 12/04/2016 139/76  mmHg Final  . Heparin Unfractionated 12/03/2016 0.79* 0.30 - 0.70 IU/mL Final   Comment:        IF HEPARIN RESULTS ARE BELOW EXPECTED VALUES, AND PATIENT DOSAGE HAS BEEN CONFIRMED, SUGGEST FOLLOW UP TESTING OF ANTITHROMBIN III LEVELS.   Marland Kitchen Heparin Unfractionated 12/04/2016 0.63  0.30 - 0.70 IU/mL Final   Comment:        IF HEPARIN RESULTS ARE BELOW EXPECTED VALUES, AND PATIENT DOSAGE HAS BEEN CONFIRMED, SUGGEST FOLLOW UP TESTING OF ANTITHROMBIN III LEVELS.   . WBC 12/04/2016 9.7  4.0 - 10.5 K/uL Final  . RBC 12/04/2016 4.40  3.87 - 5.11 MIL/uL Final  . Hemoglobin 12/04/2016 11.9* 12.0 - 15.0 g/dL Final  . HCT 12/04/2016 38.2  36.0 - 46.0 % Final  . MCV 12/04/2016 86.8  78.0 - 100.0 fL Final  . MCH 12/04/2016 27.0  26.0 - 34.0 pg Final  . MCHC 12/04/2016 31.2  30.0 - 36.0 g/dL Final  . RDW 12/04/2016 14.2  11.5 - 15.5 % Final  . Platelets 12/04/2016 262  150 - 400 K/uL Final  . Sodium 12/04/2016 139  135 - 145 mmol/L Final  . Potassium 12/04/2016 3.8  3.5 - 5.1 mmol/L Final  . Chloride 12/04/2016 105  101 - 111 mmol/L Final  . CO2 12/04/2016 26  22 - 32 mmol/L Final  . Glucose, Bld 12/04/2016 87  65 - 99 mg/dL Final  . BUN 12/04/2016 10  6 - 20 mg/dL Final  . Creatinine, Ser 12/04/2016 0.52  0.44 - 1.00 mg/dL Final  . Calcium 12/04/2016 8.8* 8.9 - 10.3 mg/dL Final  . Total Protein 12/04/2016 5.8* 6.5 - 8.1 g/dL Final  . Albumin 12/04/2016 3.0* 3.5 - 5.0 g/dL Final  . AST 12/04/2016 31  15 - 41 U/L  Final  . ALT 12/04/2016 43  14 - 54 U/L Final  . Alkaline Phosphatase 12/04/2016 137* 38 - 126 U/L Final  . Total Bilirubin 12/04/2016 0.4  0.3 - 1.2 mg/dL Final  . GFR calc non Af Amer 12/04/2016 >60  >60 mL/min Final  . GFR calc Af Amer 12/04/2016 >60  >60 mL/min Final   Comment: (NOTE) The eGFR has been calculated using the CKD EPI equation. This calculation has not been validated in all clinical situations. eGFR's persistently <60 mL/min signify possible Chronic Kidney Disease.   . Anion gap 12/04/2016 8  5 - 15 Final  . MRSA by PCR 12/03/2016 POSITIVE* NEGATIVE Final   Comment:        The GeneXpert MRSA Assay (FDA approved for NASAL specimens only), is one component of a comprehensive MRSA colonization surveillance program. It is not intended to diagnose MRSA infection nor to guide or monitor treatment for MRSA infections. RESULT CALLED TO, READ BACK BY AND VERIFIED WITH: RN Wanda Plump [facility] [id number]   . AntiThromb III Func 12/04/2016 106  75 - 120 % Final  . Protein C Activity 12/04/2016 172  73 - 180 % Final   Comment: (NOTE) Performed At: [facility] [place], [place] [id number] Lindon Romp MD KA:[id number]   . Protein C, Total 12/04/2016  161* 60 - 150 % Final   Comment: (NOTE) Performed At: [facility] [place], [place] [id number] Lindon Romp MD ID:[id number]   . Protein S Activity 12/04/2016 85  63 - 140 % Final   Comment: (NOTE) Protein S activity may be falsely increased (masking an abnormal, low result) in patients receiving direct Xa inhibitor (e.g., rivaroxaban, apixaban, edoxaban) or a direct thrombin inhibitor (e.g., dabigatran) anticoagulant treatment due to assay interference by these drugs. Performed At: [facility] [place], [place] [id number] Lindon Romp MD QP:[id number]   . Protein S Ag, Total 12/04/2016 69  60 - 150 % Final   Comment: (NOTE) This test was  developed and its performance characteristics determined by [facility]. It has not been cleared or approved by the Food and Drug Administration. Performed At: [facility] [place], [place] [id number] Lindon Romp MD PJ:[id number]   . PTT Lupus Anticoagulant 12/04/2016 29.5  0.0 - 51.9 sec Final   Comment: (NOTE) Additional testing confirms the presence of heparin in the test sample. Results obtained after heparin neutralization.   Marland Kitchen DRVVT 12/04/2016 27.8  0.0 - 47.0 sec Final  . Lupus Anticoag Interp 12/04/2016 Comment:   Corrected   Comment: (NOTE) No lupus anticoagulant was detected. Performed At: [facility] [place], [place] [id number] Lindon Romp MD IO:[id number]   . Beta-2 Glyco I IgG 12/04/2016 <9  0 - 20 GPI IgG units Final   Comment: (NOTE) The reference interval reflects a 3SD or 99th percentile interval, which is thought to represent a potentially clinically significant result in accordance with the International Consensus Statement on the classification criteria for definitive antiphospholipid syndrome (APS). J Thromb Haem 2006;4:295-306.   . Beta-2-Glycoprotein I IgM 12/04/2016 <9  0 - 32 GPI IgM units Final   Comment: (NOTE) The reference interval reflects a 3SD or 99th percentile interval, which is thought to represent a potentially clinically significant result in accordance with the International Consensus Statement on the classification criteria for definitive antiphospholipid syndrome (APS). J Thromb Haem 2006;4:295-306. Performed At: [facility] [place], [place] [id number] Lindon Romp MD IW:[id number]   . Beta-2-Glycoprotein I IgA 12/04/2016 <9  0 - 25 GPI IgA units Final   Comment: (NOTE) The reference interval reflects a 3SD or 99th percentile interval, which is thought to represent a potentially clinically significant result in accordance with the International Consensus  Statement on the classification criteria for definitive antiphospholipid syndrome (APS). J Thromb Haem 2006;4:295-306.   Marland Kitchen Homocysteine 12/04/2016 8.5  0.0 - 15.0 umol/L Final   Comment: (NOTE) Performed At: [facility] [place], [place] [id number] Lindon Romp MD JE:[id number]   . Recommendations-F5LEID: 12/04/2016 Comment   Final   Comment: (NOTE) Result:  Negative (no mutation found) Factor V Leiden is a specific mutation (R506Q) in the factor V gene that is associated with an increased risk of venous thrombosis. Factor V Leiden is more resistant to inactivation by activated protein C.  As a result, factor V persists in the circulation leading to a mild hyper- coagulable state.  The Leiden mutation accounts for 90% - 95% of APC resistance.  Factor V Leiden has been reported in patients with deep vein thrombosis, pulmonary embolus, central retinal vein occlusion, cerebral sinus thrombosis and hepatic vein thrombosis. Other risk factors to be considered in the workup for venous thrombosis include the G20210A mutation in the factor II (prothrombin) gene,  protein S and C deficiency, and antithrombin deficiencies. Anticardiolipin antibody and lupus anticoagulant analysis may be appropriate for certain patients, as well as homocysteine levels. Contact your local [facility] for information on how to order additi                          onal testing if desired. **Genetic counselors are available for health care providers to**  discuss results at [phone]). Methodology: DNA analysis of the Factor V gene was performed by allele-specific PCR. The diagnostic sensitivity and specificity is >99% for both. Molecular-based testing is highly accurate, but as in any laboratory test, diagnostic errors may occur. All test results must be combined with clinical information for the most accurate interpretation. This test was developed and its performance  characteristics determined by [facility]. It has not been cleared or approved by the Food and Drug Administration. References: Voelkerding K (1996).  Clin Lab Med [phone]. Allison Quarry, PhD, [facility] Ruben Reason, PhD, [facility] Annetta Maw, M.S., PhD, [facility] Alfredo Bach, PhD, [facility] Norva Riffle, PhD, [facility] Earlean Polka PhD, [facility] Performed At: [facility] [address] [place], [place] [id number] Nechama Guard MD HW:[id number]                          7   . Recommendations-PTGENE: 12/04/2016 Comment   Final   Comment: (NOTE) NEGATIVE No mutation identified. Comment: A point mutation (G20210A) in the factor II (prothrombin) gene is the second most common cause of inherited thrombophilia. The incidence of this mutation in the [place] Caucasian population is about 2% and in the [place] American population it is approximately 0.5%. This mutation is rare in the [place] and Native American population. Being heterozygous for a prothrombin mutation increases the risk for developing venous thrombosis about 2 to 3 times above the general population risk. Being homozygous for the prothrombin gene mutation increases the relative risk for venous thrombosis further, although it is not yet known how much further the risk is increased. In women heterozygous for the prothrombin gene mutation, the use of estrogen containing oral contraceptives increases the relative risk of venous thrombosis about 16 times and the risk of developing cerebral thrombosis is also significantly increased. In pregnancy the pr                          othrombin gene mutation increases risk for venous thrombosis and may increase risk for stillbirth, placental abruption, pre-eclampsia and fetal growth restriction. If the patient possesses two or more congenital or acquired thrombophilic risk factors, the risk for thrombosis may rise to more than the sum of the risk ratios for the individual mutations.  This assay detects only the prothrombin G20210A mutation and does not measure genetic abnormalities elsewhere in the genome. Other thrombotic risk factors may be pursued through systematic clinical laboratory analysis. These factors include the R506Q (Leiden) mutation in the Factor V gene, plasma homocysteine levels, as well as testing for deficiencies of antithrombin III, protein C and protein S. Genetic Counselors are available for health care providers to discuss results at [phone]). Methodology: DNA analysis of the Factor II gene was performed by PCR amplification followed by restriction analysis. The di                          agnostic sensitivity is >99% for both. All  the tests must be combined with clinical information for the most accurate interpretation. Molecular-based testing is highly accurate, but as in any laboratory test, diagnostic errors may occur. This test was developed and its performance characteristics determined by [facility]. It has not been cleared or approved by the Food and Drug Administration. Poort SR, et al. Blood. 1996; 64:6803-2122. Varga EA. Circulation. 2004; 482:N00-B70. Mervin Hack, et [place]; 19:700-703. Allison Quarry, PhD, [facility] Ruben Reason, PhD, [facility] Annetta Maw, M.S., PhD, [facility] Alfredo Bach, PhD, [facility] Norva Riffle, PhD, [facility] Earlean Polka, PhD, [facility] Performed At: [facility] [address] [place], [place] [id number] Nechama Guard MD [id number]   . Anticardiolipin IgG 12/04/2016 <9  0 - 14 GPL U/mL Final   Comment: (NOTE)                          Negative:              <15                          Indeterminate:     15 - 20                          Low-Med Positive: >20 - 80                          High Positive:         >80   . Anticardiolipin IgM 12/04/2016 <9  0 - 12 MPL U/mL Final   Comment: (NOTE)                          Negative:              <13                           Indeterminate:     13 - 20                          Low-Med Positive: >20 - 80                          High Positive:         >80   . Anticardiolipin IgA 12/04/2016 <9  0 - 11 APL U/mL Final   Comment: (NOTE)                          Negative:              <12                          Indeterminate:     12 - 20                          Low-Med Positive: >20 - 80                          High Positive:         >80 Performed At: [facility] [place], [place]  [id number] Lindon Romp MD [id number]   . Sodium 12/05/2016 138  135 - 145 mmol/L Final  . Potassium 12/05/2016 3.8  3.5 - 5.1 mmol/L Final  . Chloride 12/05/2016 106  101 - 111 mmol/L Final  . CO2 12/05/2016 26  22 - 32 mmol/L Final  . Glucose, Bld 12/05/2016 94  65 - 99 mg/dL Final  . BUN 12/05/2016 10  6 - 20 mg/dL Final  . Creatinine, Ser 12/05/2016 0.59  0.44 - 1.00 mg/dL Final  . Calcium 12/05/2016 8.6* 8.9 - 10.3 mg/dL Final  . GFR calc non Af Amer 12/05/2016 >60  >60 mL/min Final  . GFR calc Af Amer 12/05/2016 >60  >60 mL/min Final   Comment: (NOTE) The eGFR has been calculated using the CKD EPI equation. This calculation has not been validated in all clinical situations. eGFR's persistently <60 mL/min signify possible Chronic Kidney Disease.   . Anion gap 12/05/2016 6  5 - 15 Final  . Troponin I 12/04/2016 <0.03  <0.03 ng/mL Final         Ardath Sax, MD

## 2017-02-17 NOTE — Assessment & Plan Note (Signed)
66 y.o. female with recent apparently unprovoked pulmonary embolism with evidence of right ventricular strain by imaging, currently anticoagulated with apixaban (Eliquis). 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 At the present time, patient is undergoing suppressive therapy including rituximab.   On presentation With pulmonary embolism, no imaging of the peripheral vasculature was obtained to assess for deepening thrombosis.lab work demonstrated no indefatigable abnormalities to suggest presence of antiphospholipid antibody syndrome, antithrombin, protein C, or protein S deficiency. Patient also tested negative for presence of prothrombin gene or factor five Leiden mutations.  At this time, etiology of the thrombosis remains unclear.  Plan: -- obtain Doppler ultrasounds of all four extremities to assess for possible presence of deep vein thrombosis. --continue apixaban (Eliquis) for at least six months, potentially longer if well-tolerated by the patient. -- Return to clinic in one week to review the ultrasound findings.

## 2017-02-22 NOTE — Assessment & Plan Note (Signed)
67 y.o. female with apparently unprovoked pulmonary embolism with evidence of right ventricular strain by imaging, currently anticoagulated with apixaban (Eliquis). 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 At the present time, patient is undergoing suppressive therapy including rituximab. Lab work demonstrated no indefatigable abnormalities to suggest presence of antiphospholipid antibody syndrome, antithrombin, protein C, or protein S deficiency. Patient also tested negative for presence of prothrombin gene or factor five Leiden mutations.  At this time, etiology of the thrombosis remains unclear.  Doppler ultrasounds of bilateral upper and lower extremities obtained since last visit to the clinic showed no evidence of residual deep vein thrombosis.  Plan: --continue apixaban (Eliquis) for at least six months, potentially longer if well-tolerated by the patient. --Return to clinic in 4 months for clinical monitoring.  Patient is doing well, at that time will consider discontinuation of apixaban.

## 2017-02-22 NOTE — Progress Notes (Signed)
[facility] Follow-up Visit:  Assessment: PE (pulmonary thromboembolism) ([place]) 67 y.o. female with apparently unprovoked pulmonary embolism with evidence of right ventricular strain by imaging, currently anticoagulated with apixaban (Eliquis). 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 At the present time, patient is undergoing suppressive therapy including rituximab. Lab work demonstrated no indefatigable abnormalities to suggest presence of antiphospholipid antibody syndrome, antithrombin, protein C, or protein S deficiency. Patient also tested negative for presence of prothrombin gene or factor five Leiden mutations.  At this time, etiology of the thrombosis remains unclear.  Doppler ultrasounds of bilateral upper and lower extremities obtained since last visit to the clinic showed no evidence of residual deep vein thrombosis.  Plan: --continue apixaban (Eliquis) for at least six months, potentially longer if well-tolerated by the patient. --Return to clinic in 4 months for clinical monitoring.  Patient is doing well, at that time will consider discontinuation of apixaban.   Voice recognition software was used and creation of this note. Despite my best effort at editing the text, some misspelling/errors may have occurred.  No orders of the defined types were placed in this encounter.   All questions were answered.  . The patient knows to call the clinic with any problems, questions or concerns.  This note was electronically signed.    History of Presenting Illness LADORA OSTERBERG is a 67 y.o. female followed in the [place] for History of pulmonary embolism, referred by Dr Phill Myron. Patient's past  medical history is positive for generalized anxiety disorder, history of B12 and vitamin D deficiency, psoriatic arthritis or seronegative rheumatoid arthritis, hypertension, GERD, history of leukocytoclastic vasculitis in the lower extremities. Family history significant father dying from Venous thromboembolism.   The present time, patient denies any fever, chills, night sweats. Unexpected weight changes. Patient has had stomach stapling in the past as part of weight-loss program. She denies any shortness of breath interest, but continues to haven't written left-sided chest pains including at rest.No significant nausea, vomiting, abdominal pain, or diarrhea at this time. No hematochezia or melena. Patient denies dysuria or hematuria. Taking Apixaban (Eliquis) as prescribed without significant difficulties.  Oncological/hematological History: --Admission, 10/16-18/18: Patient reports developing progressive gastrointestinal symptoms since the end of Jul 2018 including persistent diarrhea resulting in decreased mobility. Patient also reports vasculitis flair and gout attacks over the past three years. In the past, patient have colonoscopy demonstrating collagenous colitis, previously treated with mesalamine without significant improvement. On October 5, patient underwent skin biopsy secondary to suspicion for vasculitis. Skin biopsy demonstrated vasculitis deemed unrelated due to gastrointestinal findings. Microvascular clotting was detected on biopsy. While he kept undergoing the biopsy, patient woke up at night with significant chest pain. Initially, Triton taxes with out relief. Subsequently tried nitroglycerin which did not help either. Eventually, patient presented to the emergency room where evaluation tech pulmonary embolism.             --CTA Chest, 12/03/16: Filling defects demonstrated in the left lower lobe in subsegmental pulmonary arteries consistent with pulmonary emboli. RA/RV ratio 1.294,  Consistent with possible right heart strain.             --Labs, 12/04/16: DRVVT 27.8, AT 106%, PrC 172%, PrC Ag 161%, PrS 85%, PrS Ag 69%, Homocysteine 8.5             --  Labs, 12/12/16: DRVVT, Anticardiolipin Ab, beta-2 Glycoprotein Ab -- negative; C3 126, C4 23, CH50 >60, ANA -- negative             --Treatment:                          --Heparin gtt, 10/16-18/18                         -- Apixaban (Eliquis), 12/05/16-:  --Doppler US BL UE/LE, 01/16/17: No current evidence of deep vein thrombosis in the upper or lower extremities.   Medical History: Past Medical History:  Diagnosis Date  . Anemia   . Anxiety   . Arthritis   . Depression   . Dysrhythmia    occ due to caffeine intake  . Encounter for long-term (current) use of other medications   . GERD (gastroesophageal reflux disease)   . Hepatitis   . History of hiatal hernia   . Hypertension   . IBS (irritable bowel syndrome)   . Obesity   . Osteoarthritis   . Pain in joint, multiple sites   . Pneumonia   . PONV (postoperative nausea and vomiting)    in the 1980's no problems since  . Psoriatic arthropathy ([place])   . Pulmonary embolism ([place]) 12/03/2016   submassive/notes 12/03/2016  . Raynaud disease     Surgical History: Past Surgical History:  Procedure Laterality Date  . ABDOMINAL HYSTERECTOMY    . COLONOSCOPY    . GASTRIC BYPASS    . HALLUX VALGUS CORRECTION  09/2010   Dr Arnetha Courser; right 1st ray  . HERNIA REPAIR    . JOINT REPLACEMENT     left knee times 3  . LAPAROSCOPIC CHOLECYSTECTOMY  04/2000   Archie Endo 07/03/2010  . LAPAROSCOPIC INCISIONAL / UMBILICAL / VENTRAL HERNIA REPAIR  09/2002   VHR/notes 07/03/2010  . REVISION TOTAL KNEE ARTHROPLASTY Left 10/2003   Archie Endo 07/03/2010  . REVISION TOTAL KNEE ARTHROPLASTY Left 02/2007   Archie Endo 06/21/2010  . SHOULDER HEMI-ARTHROPLASTY Right 06/28/2016   Procedure: RIGHT SHOULDER HEMI-ARTHROPLASTY;  Surgeon: Netta Cedars, MD;  Location: [place];  Service: Orthopedics;   Laterality: Right;  . TOTAL KNEE ARTHROPLASTY Left 10/1999   Archie Endo 07/03/2010  . TUBAL LIGATION      Family History: Family History  Problem Relation Age of Onset  . Depression Mother   . Deep vein thrombosis Father     Social History: Social History   Socioeconomic History  . Marital status: Married    Spouse name: Not on file  . Number of children: Not on file  . Years of education: Not on file  . Highest education level: Not on file  Social Needs  . Financial resource strain: Not on file  . Food insecurity - worry: Not on file  . Food insecurity - inability: Not on file  . Transportation needs - medical: Not on file  . Transportation needs - non-medical: Not on file  Occupational History  . Not on file  Tobacco Use  . Smoking status: Never Smoker  . Smokeless tobacco: Never Used  Substance and Sexual Activity  . Alcohol use: No  . Drug use: No  . Sexual activity: Not on file  Other Topics Concern  . Not on file  Social History Narrative  . Not on file    Allergies: Allergies  Allergen Reactions  . Vioxx [Rofecoxib] Swelling    EXTREME LEG SWELLING   .  Augmentin [Amoxicillin-Pot Clavulanate] Diarrhea  . Bactrim [Sulfamethoxazole-Trimethoprim] Diarrhea and Nausea And Vomiting  . Banana Itching and Rash  . Cephalosporins Diarrhea and Nausea And Vomiting  . Ciprofloxacin Diarrhea and Nausea And Vomiting  . Coconut Flavor Itching and Rash  . Codeine Nausea Only  . Keflex [Cephalexin] Diarrhea    DOSES > 250 MG RESULTS IN ABD PAIN  . Zithromax [Azithromycin] Diarrhea and Nausea And Vomiting    Medications:  Current Outpatient Medications  Medication Sig Dispense Refill  . apixaban (ELIQUIS) 5 MG TABS tablet Take 1 tablet (5 mg total) 2 (two) times daily by mouth. 60 tablet 12  . budesonide (ENTOCORT EC) 3 MG 24 hr capsule Take 9 mg by mouth every morning.    . cetirizine (ZYRTEC) 10 MG tablet Take 10 mg by mouth daily.    . Cholecalciferol (VITAMIN D3)  1000 units CAPS Take 1,000 Units by mouth daily.    . fluticasone (FLONASE) 50 MCG/ACT nasal spray Place 2 sprays into both nostrils daily as needed for allergies.  1  . furosemide (LASIX) 20 MG tablet TAKE 1 OR 2 TABLETS BY MOUTH DAILY FOR LOWER EXTREMITY SWELLING (Patient taking differently: Take 20-40 mg by mouth daily. TAKE 1 OR 2 TABLETS BY MOUTH DAILY FOR LOWER EXTREMITY SWELLING  DEPENDS ON AMOUNT OF SWELLING IF TAKES 1 OR 2 TABLETS) 180 tablet 3  . valACYclovir (VALTREX) 500 MG tablet Take 1,000 mg by mouth daily as needed (outbreaks). For 3 days as needed  98  . vitamin B-12 (CYANOCOBALAMIN) 1000 MCG tablet Take 1,000 mcg by mouth daily.    Marland Kitchen ALPRAZolam (XANAX) 1 MG tablet Take 1 tablet (1 mg total) by mouth at bedtime as needed for sleep. 30 tablet 0  . benazepril (LOTENSIN) 20 MG tablet TAKE 1 TABLET(20 MG) BY MOUTH DAILY 90 tablet 3  . diclofenac (VOLTAREN) 75 MG EC tablet TAKE 1 TABLET BY MOUTH TWICE DAILY AS NEEDED FOR JOINT PAIN 180 tablet 3  . diphenoxylate-atropine (LOMOTIL) 2.5-0.025 MG tablet Take 2 tablets by mouth 4 (four) times daily as needed for diarrhea or loose stools. (Patient not taking: Reported on 01/21/2017) 30 tablet 0  . esomeprazole (NEXIUM) 20 MG capsule TAKE 1 CAPSULE(20 MG) BY MOUTH DAILY 90 capsule 0  . fluconazole (DIFLUCAN) 100 MG tablet TAKE 1 TABLET BY MOUTH ONCE DAILY FOR 7 DOSES THEN 1 TABLET BY MOUTH ONCE WEEKLY 21 tablet 3  . ibuprofen (ADVIL,MOTRIN) 200 MG tablet Take 400 mg by mouth every 6 (six) hours as needed for mild pain.    Marland Kitchen oxyCODONE-acetaminophen (ROXICET) 5-325 MG tablet Take 1-2 tablets by mouth every 4 (four) hours as needed for severe pain. (Patient not taking: Reported on 01/21/2017) 40 tablet 0  . predniSONE (DELTASONE) 10 MG tablet Take 5-10 mg by mouth See admin instructions. Take 10 mg 10/16 & 10/17. Take 5 mg for the next four days    . venlafaxine XR (EFFEXOR-XR) 150 MG 24 hr capsule TAKE 1 CAPSULE(150 MG) BY MOUTH DAILY 90 capsule 3   . venlafaxine XR (EFFEXOR-XR) 75 MG 24 hr capsule TAKE 1 CAPSULE BY MOUTH EVERY DAY IN ADDITION TO 150 MG CAPSULE FOR. MAX OF 225 MG PER DAY 90 capsule 3   No current facility-administered medications for this visit.     Review of Systems: Review of Systems  Constitutional: Negative for appetite change, chills, diaphoresis, fatigue and fever.  Respiratory: Positive for chest tightness and shortness of breath. Negative for hemoptysis.   All other systems  reviewed and are negative.    PHYSICAL EXAMINATION Blood pressure 125/70, pulse 83, temperature 97.8 F (36.6 C), temperature source Oral, resp. rate 20, weight 193 lb (87.5 kg), SpO2 97 %.  ECOG PERFORMANCE STATUS: 2 - Symptomatic, <50% confined to bed  Physical Exam  Constitutional: She is oriented to person, place, and time and well-developed, well-nourished, and in no distress. No distress.  HENT:  Head: Normocephalic and atraumatic.  Mouth/Throat: Oropharynx is clear and moist. No oropharyngeal exudate.  Eyes: Conjunctivae and EOM are normal. Pupils are equal, round, and reactive to light. No scleral icterus.  Neck: No thyromegaly present.  Cardiovascular: Normal rate, regular rhythm and normal heart sounds.  No murmur heard. Pulmonary/Chest: Effort normal and breath sounds normal. No respiratory distress. She has no wheezes. She has no rales.  Abdominal: Soft. Bowel sounds are normal. She exhibits no distension. There is no tenderness. There is no rebound and no guarding.  Musculoskeletal: She exhibits edema. She exhibits no tenderness.  Bilateral trace lower extremity edema.  Lymphadenopathy:    She has no cervical adenopathy.  Neurological: She is alert and oriented to person, place, and time. She has normal reflexes. No cranial nerve deficit.  Skin: Skin is warm and dry. No rash noted. She is not diaphoretic. No erythema. No pallor.     LABORATORY DATA: I have personally reviewed the data as listed: No visits with  results within 1 Week(s) from this visit.  Latest known visit with results is:  Admission on 12/03/2016, Discharged on 12/05/2016  Component Date Value Ref Range Status  . Sodium 12/03/2016 134* 135 - 145 mmol/L Final  . Potassium 12/03/2016 4.0  3.5 - 5.1 mmol/L Final  . Chloride 12/03/2016 100* 101 - 111 mmol/L Final  . CO2 12/03/2016 26  22 - 32 mmol/L Final  . Glucose, Bld 12/03/2016 126* 65 - 99 mg/dL Final  . BUN 12/03/2016 17  6 - 20 mg/dL Final  . Creatinine, Ser 12/03/2016 0.71  0.44 - 1.00 mg/dL Final  . Calcium 12/03/2016 8.9  8.9 - 10.3 mg/dL Final  . GFR calc non Af Amer 12/03/2016 >60  >60 mL/min Final  . GFR calc Af Amer 12/03/2016 >60  >60 mL/min Final   Comment: (NOTE) The eGFR has been calculated using the CKD EPI equation. This calculation has not been validated in all clinical situations. eGFR's persistently <60 mL/min signify possible Chronic Kidney Disease.   . Anion gap 12/03/2016 8  5 - 15 Final  . WBC 12/03/2016 10.6* 4.0 - 10.5 K/uL Final  . RBC 12/03/2016 4.35  3.87 - 5.11 MIL/uL Final  . Hemoglobin 12/03/2016 11.9* 12.0 - 15.0 g/dL Final  . HCT 12/03/2016 37.0  36.0 - 46.0 % Final  . MCV 12/03/2016 85.1  78.0 - 100.0 fL Final  . MCH 12/03/2016 27.4  26.0 - 34.0 pg Final  . MCHC 12/03/2016 32.2  30.0 - 36.0 g/dL Final  . RDW 12/03/2016 13.9  11.5 - 15.5 % Final  . Platelets 12/03/2016 289  150 - 400 K/uL Final  . Troponin i, poc 12/03/2016 0.00  0.00 - 0.08 ng/mL Final  . Comment 3 12/03/2016          Final   Comment: Due to the release kinetics of cTnI, a negative result within the first hours of the onset of symptoms does not rule out myocardial infarction with certainty. If myocardial infarction is still suspected, repeat the test at appropriate intervals.   . Troponin i, poc 12/03/2016  0.00  0.00 - 0.08 ng/mL Final  . Comment 3 12/03/2016          Final   Comment: Due to the release kinetics of cTnI, a negative result within the first hours of  the onset of symptoms does not rule out myocardial infarction with certainty. If myocardial infarction is still suspected, repeat the test at appropriate intervals.   Marland Kitchen HIV Screen 4th Generation wRfx 12/03/2016 Non Reactive  Non Reactive Final   Comment: (NOTE) Performed At: [facility] [place], [place] [id number] Lindon Romp MD QI:[id number]   . Troponin I 12/03/2016 <0.03  <0.03 ng/mL Final  . Troponin I 12/03/2016 <0.03  <0.03 ng/mL Final  . Troponin I 12/03/2016 <0.03  <0.03 ng/mL Final  . Weight 12/04/2016 3,073.6  oz Final  . Height 12/04/2016 59  in Final  . BP 12/04/2016 139/76  mmHg Final  . Heparin Unfractionated 12/03/2016 0.79* 0.30 - 0.70 IU/mL Final   Comment:        IF HEPARIN RESULTS ARE BELOW EXPECTED VALUES, AND PATIENT DOSAGE HAS BEEN CONFIRMED, SUGGEST FOLLOW UP TESTING OF ANTITHROMBIN III LEVELS.   Marland Kitchen Heparin Unfractionated 12/04/2016 0.63  0.30 - 0.70 IU/mL Final   Comment:        IF HEPARIN RESULTS ARE BELOW EXPECTED VALUES, AND PATIENT DOSAGE HAS BEEN CONFIRMED, SUGGEST FOLLOW UP TESTING OF ANTITHROMBIN III LEVELS.   . WBC 12/04/2016 9.7  4.0 - 10.5 K/uL Final  . RBC 12/04/2016 4.40  3.87 - 5.11 MIL/uL Final  . Hemoglobin 12/04/2016 11.9* 12.0 - 15.0 g/dL Final  . HCT 12/04/2016 38.2  36.0 - 46.0 % Final  . MCV 12/04/2016 86.8  78.0 - 100.0 fL Final  . MCH 12/04/2016 27.0  26.0 - 34.0 pg Final  . MCHC 12/04/2016 31.2  30.0 - 36.0 g/dL Final  . RDW 12/04/2016 14.2  11.5 - 15.5 % Final  . Platelets 12/04/2016 262  150 - 400 K/uL Final  . Sodium 12/04/2016 139  135 - 145 mmol/L Final  . Potassium 12/04/2016 3.8  3.5 - 5.1 mmol/L Final  . Chloride 12/04/2016 105  101 - 111 mmol/L Final  . CO2 12/04/2016 26  22 - 32 mmol/L Final  . Glucose, Bld 12/04/2016 87  65 - 99 mg/dL Final  . BUN 12/04/2016 10  6 - 20 mg/dL Final  . Creatinine, Ser 12/04/2016 0.52  0.44 - 1.00 mg/dL Final  . Calcium 12/04/2016 8.8* 8.9 - 10.3 mg/dL  Final  . Total Protein 12/04/2016 5.8* 6.5 - 8.1 g/dL Final  . Albumin 12/04/2016 3.0* 3.5 - 5.0 g/dL Final  . AST 12/04/2016 31  15 - 41 U/L Final  . ALT 12/04/2016 43  14 - 54 U/L Final  . Alkaline Phosphatase 12/04/2016 137* 38 - 126 U/L Final  . Total Bilirubin 12/04/2016 0.4  0.3 - 1.2 mg/dL Final  . GFR calc non Af Amer 12/04/2016 >60  >60 mL/min Final  . GFR calc Af Amer 12/04/2016 >60  >60 mL/min Final   Comment: (NOTE) The eGFR has been calculated using the CKD EPI equation. This calculation has not been validated in all clinical situations. eGFR's persistently <60 mL/min signify possible Chronic Kidney Disease.   . Anion gap 12/04/2016 8  5 - 15 Final  . MRSA by PCR 12/03/2016 POSITIVE* NEGATIVE Final   Comment:        The GeneXpert MRSA Assay (FDA approved for NASAL specimens only), is one component of a  comprehensive MRSA colonization surveillance program. It is not intended to diagnose MRSA infection nor to guide or monitor treatment for MRSA infections. RESULT CALLED TO, READ BACK BY AND VERIFIED WITH: RN Wanda Plump [facility] [id number]   . AntiThromb III Func 12/04/2016 106  75 - 120 % Final  . Protein C Activity 12/04/2016 172  73 - 180 % Final   Comment: (NOTE) Performed At: [facility] [place], [place] [id number] Lindon Romp MD IR:[id number]   . Protein C, Total 12/04/2016 161* 60 - 150 % Final   Comment: (NOTE) Performed At: [facility] [place], [place] [id number] Lindon Romp MD PO:[id number]   . Protein S Activity 12/04/2016 85  63 - 140 % Final   Comment: (NOTE) Protein S activity may be falsely increased (masking an abnormal, low result) in patients receiving direct Xa inhibitor (e.g., rivaroxaban, apixaban, edoxaban) or a direct thrombin inhibitor (e.g., dabigatran) anticoagulant treatment due to assay interference by these drugs. Performed At: [facility] [place],  [place] [id number] Lindon Romp MD YP:[id number]   . Protein S Ag, Total 12/04/2016 69  60 - 150 % Final   Comment: (NOTE) This test was developed and its performance characteristics determined by [facility]. It has not been cleared or approved by the Food and Drug Administration. Performed At: [facility] [place], [place] [id number] Lindon Romp MD AS:[id number]   . PTT Lupus Anticoagulant 12/04/2016 29.5  0.0 - 51.9 sec Final   Comment: (NOTE) Additional testing confirms the presence of heparin in the test sample. Results obtained after heparin neutralization.   Marland Kitchen DRVVT 12/04/2016 27.8  0.0 - 47.0 sec Final  . Lupus Anticoag Interp 12/04/2016 Comment:   Corrected   Comment: (NOTE) No lupus anticoagulant was detected. Performed At: [facility] [place], [place] [id number] Lindon Romp MD XB:[id number]   . Beta-2 Glyco I IgG 12/04/2016 <9  0 - 20 GPI IgG units Final   Comment: (NOTE) The reference interval reflects a 3SD or 99th percentile interval, which is thought to represent a potentially clinically significant result in accordance with the International Consensus Statement on the classification criteria for definitive antiphospholipid syndrome (APS). J Thromb Haem 2006;4:295-306.   . Beta-2-Glycoprotein I IgM 12/04/2016 <9  0 - 32 GPI IgM units Final   Comment: (NOTE) The reference interval reflects a 3SD or 99th percentile interval, which is thought to represent a potentially clinically significant result in accordance with the International Consensus Statement on the classification criteria for definitive antiphospholipid syndrome (APS). J Thromb Haem 2006;4:295-306. Performed At: [facility] [place], [place] [id number] Lindon Romp MD [id number]   . Beta-2-Glycoprotein I IgA 12/04/2016 <9  0 - 25 GPI IgA units Final   Comment: (NOTE) The reference interval reflects a 3SD or 99th  percentile interval, which is thought to represent a potentially clinically significant result in accordance with the International Consensus Statement on the classification criteria for definitive antiphospholipid syndrome (APS). J Thromb Haem 2006;4:295-306.   Marland Kitchen Homocysteine 12/04/2016 8.5  0.0 - 15.0 umol/L Final   Comment: (NOTE) Performed At: [facility] [place], [place] [id number] Lindon Romp MD HU:[id number]   . Recommendations-F5LEID: 12/04/2016 Comment   Final   Comment: (NOTE) Result:  Negative (no mutation found) Factor V Leiden is a specific mutation (R506Q) in the factor V gene that is associated with an increased risk of  venous thrombosis. Factor V Leiden is more resistant to inactivation by activated protein C.  As a result, factor V persists in the circulation leading to a mild hyper- coagulable state.  The Leiden mutation accounts for 90% - 95% of APC resistance.  Factor V Leiden has been reported in patients with deep vein thrombosis, pulmonary embolus, central retinal vein occlusion, cerebral sinus thrombosis and hepatic vein thrombosis. Other risk factors to be considered in the workup for venous thrombosis include the G20210A mutation in the factor II (prothrombin) gene, protein S and C deficiency, and antithrombin deficiencies. Anticardiolipin antibody and lupus anticoagulant analysis may be appropriate for certain patients, as well as homocysteine levels. Contact your local [facility] for information on how to order additi                          onal testing if desired. **Genetic counselors are available for health care providers to**  discuss results at [phone]). Methodology: DNA analysis of the Factor V gene was performed by allele-specific PCR. The diagnostic sensitivity and specificity is >99% for both. Molecular-based testing is highly accurate, but as in any laboratory test, diagnostic errors may occur. All  test results must be combined with clinical information for the most accurate interpretation. This test was developed and its performance characteristics determined by [facility]. It has not been cleared or approved by the Food and Drug Administration. References: Voelkerding K (1996).  Clin Lab Med [phone]. Allison Quarry, PhD, [facility] Ruben Reason, PhD, [facility] Annetta Maw, M.S., PhD, [facility] Alfredo Bach, PhD, [facility] Norva Riffle, PhD, [facility] Earlean Polka PhD, [facility] Performed At: [facility] [address] [place], [place] [id number] Nechama Guard MD KD:[id number]                          0   . Recommendations-PTGENE: 12/04/2016 Comment   Final   Comment: (NOTE) NEGATIVE No mutation identified. Comment: A point mutation (G20210A) in the factor II (prothrombin) gene is the second most common cause of inherited thrombophilia. The incidence of this mutation in the [place] Caucasian population is about 2% and in the [place] American population it is approximately 0.5%. This mutation is rare in the [place] and Native American population. Being heterozygous for a prothrombin mutation increases the risk for developing venous thrombosis about 2 to 3 times above the general population risk. Being homozygous for the prothrombin gene mutation increases the relative risk for venous thrombosis further, although it is not yet known how much further the risk is increased. In women heterozygous for the prothrombin gene mutation, the use of estrogen containing oral contraceptives increases the relative risk of venous thrombosis about 16 times and the risk of developing cerebral thrombosis is also significantly increased. In pregnancy the pr                          othrombin gene mutation increases risk for venous thrombosis and may increase risk for stillbirth, placental abruption, pre-eclampsia and fetal growth restriction. If the patient possesses two or more congenital or  acquired thrombophilic risk factors, the risk for thrombosis may rise to more than the sum of the risk ratios for the individual mutations. This assay detects only the prothrombin G20210A mutation and does not measure genetic abnormalities elsewhere in the genome. Other thrombotic risk factors may be pursued through systematic clinical laboratory analysis. These factors include  the R506Q (Leiden) mutation in the Factor V gene, plasma homocysteine levels, as well as testing for deficiencies of antithrombin III, protein C and protein S. Genetic Counselors are available for health care providers to discuss results at [phone]). Methodology: DNA analysis of the Factor II gene was performed by PCR amplification followed by restriction analysis. The di                          agnostic sensitivity is >99% for both. All the tests must be combined with clinical information for the most accurate interpretation. Molecular-based testing is highly accurate, but as in any laboratory test, diagnostic errors may occur. This test was developed and its performance characteristics determined by [facility]. It has not been cleared or approved by the Food and Drug Administration. Poort SR, et al. Blood. 1996; 38:4665-9935. Varga EA. Circulation. 2004; 701:X79-T90. Mervin Hack, et [place]; 19:700-703. Allison Quarry, PhD, [facility] Ruben Reason, PhD, [facility] Annetta Maw, M.S., PhD, [facility] Alfredo Bach, PhD, [facility] Norva Riffle, PhD, [facility] Earlean Polka, PhD, [facility] Performed At: [facility] [address] [place], [place] [id number] Nechama Guard MD [id number]   . Anticardiolipin IgG 12/04/2016 <9  0 - 14 GPL U/mL Final   Comment: (NOTE)                          Negative:              <15                          Indeterminate:     15 - 20                          Low-Med Positive: >20 - 80                          High Positive:          >80   . Anticardiolipin IgM 12/04/2016 <9  0 - 12 MPL U/mL Final   Comment: (NOTE)                          Negative:              <13                          Indeterminate:     13 - 20                          Low-Med Positive: >20 - 80                          High Positive:         >80   . Anticardiolipin IgA 12/04/2016 <9  0 - 11 APL U/mL Final   Comment: (NOTE)                          Negative:              <12  Indeterminate:     12 - 20                          Low-Med Positive: >20 - 80                          High Positive:         >80 Performed At: [facility] [place], [place] [id number] Lindon Romp MD [id number]   . Sodium 12/05/2016 138  135 - 145 mmol/L Final  . Potassium 12/05/2016 3.8  3.5 - 5.1 mmol/L Final  . Chloride 12/05/2016 106  101 - 111 mmol/L Final  . CO2 12/05/2016 26  22 - 32 mmol/L Final  . Glucose, Bld 12/05/2016 94  65 - 99 mg/dL Final  . BUN 12/05/2016 10  6 - 20 mg/dL Final  . Creatinine, Ser 12/05/2016 0.59  0.44 - 1.00 mg/dL Final  . Calcium 12/05/2016 8.6* 8.9 - 10.3 mg/dL Final  . GFR calc non Af Amer 12/05/2016 >60  >60 mL/min Final  . GFR calc Af Amer 12/05/2016 >60  >60 mL/min Final   Comment: (NOTE) The eGFR has been calculated using the CKD EPI equation. This calculation has not been validated in all clinical situations. eGFR's persistently <60 mL/min signify possible Chronic Kidney Disease.   . Anion gap 12/05/2016 6  5 - 15 Final  . Troponin I 12/04/2016 <0.03  <0.03 ng/mL Final       Ardath Sax, MD

## 2017-05-05 DIAGNOSIS — L4050 Arthropathic psoriasis, unspecified: Secondary | ICD-10-CM | POA: Diagnosis not present

## 2017-05-05 DIAGNOSIS — Z79899 Other long term (current) drug therapy: Secondary | ICD-10-CM | POA: Diagnosis not present

## 2017-05-05 DIAGNOSIS — M0609 Rheumatoid arthritis without rheumatoid factor, multiple sites: Secondary | ICD-10-CM | POA: Diagnosis not present

## 2017-05-05 DIAGNOSIS — Z6837 Body mass index (BMI) 37.0-37.9, adult: Secondary | ICD-10-CM | POA: Diagnosis not present

## 2017-05-05 DIAGNOSIS — M310 Hypersensitivity angiitis: Secondary | ICD-10-CM | POA: Diagnosis not present

## 2017-05-05 DIAGNOSIS — E669 Obesity, unspecified: Secondary | ICD-10-CM | POA: Diagnosis not present

## 2017-05-05 DIAGNOSIS — L409 Psoriasis, unspecified: Secondary | ICD-10-CM | POA: Diagnosis not present

## 2017-05-05 DIAGNOSIS — I7300 Raynaud's syndrome without gangrene: Secondary | ICD-10-CM | POA: Diagnosis not present

## 2017-05-07 ENCOUNTER — Other Ambulatory Visit: Payer: Self-pay

## 2017-05-07 ENCOUNTER — Ambulatory Visit (INDEPENDENT_AMBULATORY_CARE_PROVIDER_SITE_OTHER): Payer: Medicare HMO | Admitting: Family Medicine

## 2017-05-07 ENCOUNTER — Encounter: Payer: Self-pay | Admitting: Family Medicine

## 2017-05-07 DIAGNOSIS — F341 Dysthymic disorder: Secondary | ICD-10-CM | POA: Diagnosis not present

## 2017-05-07 DIAGNOSIS — L959 Vasculitis limited to the skin, unspecified: Secondary | ICD-10-CM | POA: Diagnosis not present

## 2017-05-07 DIAGNOSIS — K52831 Collagenous colitis: Secondary | ICD-10-CM | POA: Diagnosis not present

## 2017-05-07 DIAGNOSIS — I2699 Other pulmonary embolism without acute cor pulmonale: Secondary | ICD-10-CM

## 2017-05-07 DIAGNOSIS — L4050 Arthropathic psoriasis, unspecified: Secondary | ICD-10-CM | POA: Diagnosis not present

## 2017-05-07 DIAGNOSIS — R69 Illness, unspecified: Secondary | ICD-10-CM | POA: Diagnosis not present

## 2017-05-07 HISTORY — DX: Collagenous colitis: K52.831

## 2017-05-07 MED ORDER — BENAZEPRIL HCL 20 MG PO TABS: ORAL_TABLET | ORAL | 3 refills | Status: DC

## 2017-05-07 MED ORDER — ALPRAZOLAM 1 MG PO TABS: 1.0000 mg | ORAL_TABLET | Freq: Two times a day (BID) | ORAL | 5 refills | Status: DC | PRN

## 2017-05-07 MED ORDER — WARFARIN SODIUM 2 MG PO TABS: 2.0000 mg | ORAL_TABLET | Freq: Every day | ORAL | 1 refills | Status: DC

## 2017-05-07 NOTE — Assessment & Plan Note (Signed)
She has done well on her current dose of venlafaxine.  Given her financial issues, we discussed possibly decreasing her total daily dose from 225 03/21/1946.  With the current dosing, she has to buy 2 separate prescriptions some month and each 1 is going to be at least $60.  She agreed to try just the 150 mg dose and let me know how that goes.  Would want her to check her blood pressure after she changes dose as well as she is currently running fairly low and may need to adjust her ACE inhibitor given the potential effect of venlafaxine to increase blood pressure.  She is aware of this.  She will take some blood pressure readings and let me know as well.

## 2017-05-07 NOTE — Patient Instructions (Signed)
Start coumadin tomorrow and stop Eliquis (last dose today). Come Monday for a lab draw. MAKE LAB ONLY APPT Monday FOR INR CHECK

## 2017-05-07 NOTE — Assessment & Plan Note (Signed)
Currently on rituximab

## 2017-05-07 NOTE — Progress Notes (Signed)
    CHIEF COMPLAINT / HPI: #1.  Medication issues: Her insurance is changed and she is unable to apixaban.  She has been trying to "taper herself off it.  She has been on a 4-1/2 months for large pulmonary embolus that showed some right heart strain. #2.  Rheumatological medications also affected by insurance.  They stopped her Remicade start her on rituximab.  She saw the rheumatologist earlier this week.  She seems to be tolerating this well.  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 3.  Hypertension: Doing well without any problems.  Needs refill on her benazepril 4.  Continues to have multiple arthralgias that are being followed by her rheumatologist.  Evidently the rituximab is not going to address her arthralgias in the same fashion at the Remicade did she is concerned about this.  Currently having some issues with her shoulder, thumb. 5.  Also having trouble paying for her budesonide which was for her stomach issues.  Currently she has not had any recurrence of those issues but she is essentially out of that medication. 6.  Chronic insomnia/anxiety.  Doing well.  These medicines are going to cost her more as well.  REVIEW OF SYSTEMS: Pertinent review of systems: negative for fever or unusual weight change. Denies chest pain.  She has some mild occasional lower extremity edema resolves overnight.  No current shortness of breath, no wheezing.  No insomnia.  Mood is stable.  PERTINENT  PMH / PSH: I have reviewed the patient's medications, allergies, past medical and surgical history, smoking status and updated in the EMR as appropriate. Recent episode of leukocytoclastic vasculitis Recent diagnosis of collagenous colitis Ongoing diagnosis of seronegative/psoriatic arthritis plus minus connective tissue disorder followed by rheumatology Submassive pulmonary embolus 4 months ago, right heart strain. Financial problems with medication  OBJECTIVE: Vital  signs reviewed. GENERAL: Well-developed, well-nourished, no acute distress. CARDIOVASCULAR: Regular rate and rhythm no murmur gallop or rub LUNGS: Clear to auscultation bilaterally, no rales or wheeze. ABDOMEN: Soft positive bowel sounds.  Nontender, nondistended, no masses although this exam is limited by body habitus NEURO: No gross focal neurological deficits. MSK: Movement of extremity x 4.  Pain with left shoulder abduction above 90 degrees.  Mild pain with flexion extension right knee patella.  Positive medial joint line tenderness. SKIN: Multiple prominent varicose veins bilateral lower extremities.  She has trace edema nonpitting bilaterally to the area of the ankle.  There is a small area 2 cm x 3 cm of slight fluid collection on the right anterior chin that I think is related to small right knee effusion. VASCULAR: Cap refill is <3 seconds.  Radial pulses 2+ bilateral symmetric.  Dorsalis pedis pulses 1-2+ bilaterally symmetrical. PSYCHIATRIC: Alert and oriented x4.  Affect is interactive.  Speech is normal fluency and content.  Judgment is normal.  Remote memory is normal.    ASSESSMENT / PLAN: Please see problem oriented charting for details She has multiple complex problems which we tried to address today making several changes in her medication regimen including starting warfarin, venlafaxine; possible need for future adjustment of blood pressure medications.  Greater than 50% of our 40-minute office visit was spent in counseling and education regarding all of these issues and coordination of her complex medical care

## 2017-05-07 NOTE — Assessment & Plan Note (Addendum)
Long conversation with her regarding the need for at least 6 months of anticoagulation.  To "taper off" apixaban I think puts her at significant risk.   She has seronegative arthritis plus minus additional connective tissue disorder, has had a recent episode of vasculitis and has had collagenous colitis.  All of these lead me to believe she has some autoimmune issues that are not well known.  I would favor long-term anticoagulation.  After conversation today she agreed to start warfarin and see me back in 2 months at which time we will discuss how she is doing and whether or not she is willing  to continue long-term.  We discussed other medications that may interfere..  She is currently taking diclofenac and we discussed the fact that increases her risk of GI bleeding.  Given her multiple arthralgias, I do not think she will be able to stop that.    We reviewed red flags for gastro-intestinal bleeding or upset.  She is also taking fluconazole once a week and that has been working well for her for chronic candidal infections.  As long as she stays on stable dose, will work her warfarin dosing around that.  She reports having been on warfarin once before many years ago so she is somewhat removed with it.

## 2017-05-09 ENCOUNTER — Other Ambulatory Visit: Payer: Self-pay | Admitting: Family Medicine

## 2017-05-09 DIAGNOSIS — I2699 Other pulmonary embolism without acute cor pulmonale: Secondary | ICD-10-CM

## 2017-05-12 ENCOUNTER — Other Ambulatory Visit (INDEPENDENT_AMBULATORY_CARE_PROVIDER_SITE_OTHER): Payer: Medicare HMO

## 2017-05-12 DIAGNOSIS — Z7901 Long term (current) use of anticoagulants: Secondary | ICD-10-CM

## 2017-05-12 DIAGNOSIS — I2699 Other pulmonary embolism without acute cor pulmonale: Secondary | ICD-10-CM | POA: Diagnosis not present

## 2017-05-12 LAB — POCT INR: INR: 1

## 2017-05-16 ENCOUNTER — Ambulatory Visit (INDEPENDENT_AMBULATORY_CARE_PROVIDER_SITE_OTHER): Payer: Medicare HMO | Admitting: *Deleted

## 2017-05-16 DIAGNOSIS — Z7901 Long term (current) use of anticoagulants: Secondary | ICD-10-CM | POA: Diagnosis not present

## 2017-05-16 LAB — POCT INR: INR: 1.3

## 2017-05-20 ENCOUNTER — Encounter: Payer: Self-pay | Admitting: Hematology and Oncology

## 2017-05-20 ENCOUNTER — Telehealth: Payer: Self-pay

## 2017-05-20 ENCOUNTER — Inpatient Hospital Stay: Payer: Medicare HMO | Attending: Hematology and Oncology | Admitting: Hematology and Oncology

## 2017-05-20 ENCOUNTER — Ambulatory Visit (INDEPENDENT_AMBULATORY_CARE_PROVIDER_SITE_OTHER): Payer: Medicare HMO | Admitting: *Deleted

## 2017-05-20 VITALS — BP 141/56 | HR 71 | Temp 97.9°F | Resp 20 | Ht 59.0 in | Wt 191.4 lb

## 2017-05-20 DIAGNOSIS — K449 Diaphragmatic hernia without obstruction or gangrene: Secondary | ICD-10-CM | POA: Insufficient documentation

## 2017-05-20 DIAGNOSIS — M109 Gout, unspecified: Secondary | ICD-10-CM | POA: Insufficient documentation

## 2017-05-20 DIAGNOSIS — L4050 Arthropathic psoriasis, unspecified: Secondary | ICD-10-CM | POA: Insufficient documentation

## 2017-05-20 DIAGNOSIS — I7300 Raynaud's syndrome without gangrene: Secondary | ICD-10-CM | POA: Insufficient documentation

## 2017-05-20 DIAGNOSIS — K52831 Collagenous colitis: Secondary | ICD-10-CM | POA: Diagnosis not present

## 2017-05-20 DIAGNOSIS — F329 Major depressive disorder, single episode, unspecified: Secondary | ICD-10-CM | POA: Diagnosis not present

## 2017-05-20 DIAGNOSIS — Z79899 Other long term (current) drug therapy: Secondary | ICD-10-CM | POA: Diagnosis not present

## 2017-05-20 DIAGNOSIS — F411 Generalized anxiety disorder: Secondary | ICD-10-CM | POA: Insufficient documentation

## 2017-05-20 DIAGNOSIS — K219 Gastro-esophageal reflux disease without esophagitis: Secondary | ICD-10-CM | POA: Diagnosis not present

## 2017-05-20 DIAGNOSIS — Z7901 Long term (current) use of anticoagulants: Secondary | ICD-10-CM | POA: Diagnosis not present

## 2017-05-20 DIAGNOSIS — I776 Arteritis, unspecified: Secondary | ICD-10-CM | POA: Diagnosis not present

## 2017-05-20 DIAGNOSIS — Z9884 Bariatric surgery status: Secondary | ICD-10-CM | POA: Diagnosis not present

## 2017-05-20 DIAGNOSIS — Z86711 Personal history of pulmonary embolism: Secondary | ICD-10-CM

## 2017-05-20 DIAGNOSIS — I2699 Other pulmonary embolism without acute cor pulmonale: Secondary | ICD-10-CM

## 2017-05-20 DIAGNOSIS — E559 Vitamin D deficiency, unspecified: Secondary | ICD-10-CM | POA: Insufficient documentation

## 2017-05-20 DIAGNOSIS — E669 Obesity, unspecified: Secondary | ICD-10-CM | POA: Insufficient documentation

## 2017-05-20 DIAGNOSIS — M0600 Rheumatoid arthritis without rheumatoid factor, unspecified site: Secondary | ICD-10-CM | POA: Diagnosis not present

## 2017-05-20 DIAGNOSIS — I10 Essential (primary) hypertension: Secondary | ICD-10-CM | POA: Diagnosis not present

## 2017-05-20 LAB — POCT INR: INR: 2.5

## 2017-05-20 NOTE — Telephone Encounter (Signed)
Printed avs and calender of upcoming appointment. Per 4/2 los 

## 2017-05-27 ENCOUNTER — Ambulatory Visit (INDEPENDENT_AMBULATORY_CARE_PROVIDER_SITE_OTHER): Payer: Medicare HMO | Admitting: *Deleted

## 2017-05-27 DIAGNOSIS — Z7901 Long term (current) use of anticoagulants: Secondary | ICD-10-CM

## 2017-05-27 LAB — POCT INR: INR: 3.3

## 2017-06-02 NOTE — Assessment & Plan Note (Signed)
67 y.o. female with apparently unprovoked pulmonary embolism with evidence of right ventricular strain by imaging, previously anticoagulated with apixaban (Eliquis) and recently transitioned to Coumadin due to apixaban being unaffordable. 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 At the present time, patient is undergoing suppressive therapy including rituximab. Lab work demonstrated no identifiable abnormalities to suggest presence of antiphospholipid antibody syndrome, antithrombin, protein C, or protein S deficiency. Patient also tested negative for presence of prothrombin gene or factor five Leiden mutations.  At this time, etiology of the thrombosis remains unclear.  Doppler ultrasounds of bilateral upper and lower extremities obtained since last visit to the clinic showed no evidence of residual deep vein thrombosis.  Plan: --Agree with plan to continue warfarin anticoagulation indefinitely due to significant risk of recurrent thrombosis in the severity of the initial presentation --Return to our clinic in 12 months for continued clinical monitoring

## 2017-06-02 NOTE — Progress Notes (Signed)
[facility] Follow-up Visit:  Assessment: PE (pulmonary thromboembolism) ([place]) 67 y.o. female with apparently unprovoked pulmonary embolism with evidence of right ventricular strain by imaging, previously anticoagulated with apixaban (Eliquis) and recently transitioned to Coumadin due to apixaban being unaffordable. 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 At the present time, patient is undergoing suppressive therapy including rituximab. Lab work demonstrated no identifiable abnormalities to suggest presence of antiphospholipid antibody syndrome, antithrombin, protein C, or protein S deficiency. Patient also tested negative for presence of prothrombin gene or factor five Leiden mutations.  At this time, etiology of the thrombosis remains unclear.  Doppler ultrasounds of bilateral upper and lower extremities obtained since last visit to the clinic showed no evidence of residual deep vein thrombosis.  Plan: --Agree with plan to continue warfarin anticoagulation indefinitely due to significant risk of recurrent thrombosis in the severity of the initial presentation --Return to our clinic in 12 months for continued clinical monitoring   Voice recognition software was used and creation of this note. Despite my best effort at editing the text, some misspelling/errors may have occurred.  No orders of the defined types were placed in this encounter.   All questions were answered.  . The patient knows to call the clinic with any problems, questions or concerns.  This note was electronically signed.    History of Presenting Illness [name] is a 67 y.o. female followed in the [facility] for History of pulmonary  embolism, referred by Dr Phill Myron. Patient's past medical history is positive for generalized anxiety disorder, history of B12 and vitamin D deficiency, psoriatic arthritis or seronegative rheumatoid arthritis, hypertension, GERD, history of leukocytoclastic vasculitis in the lower extremities. Family history significant father dying from Venous thromboembolism.   The present time, patient denies any fever, chills, night sweats. Unexpected weight changes. Patient has had stomach stapling in the past as part of weight-loss program. She denies any shortness of breath interest, but continues to haven't written left-sided chest pains including at rest.No significant nausea, vomiting, abdominal pain, or diarrhea at this time. No hematochezia or melena. Patient denies dysuria or hematuria.  In the interim, patient was transitioned from apixaban to Coumadin due to inability to continue anticoagulation with apixaban due to cost.  Oncological/hematological History: --Admission, 10/16-18/18: Patient reports developing progressive gastrointestinal symptoms since the end of Jul 2018 including persistent diarrhea resulting in decreased mobility. Patient also reports vasculitis flair and gout attacks over the past three years. In the past, patient have colonoscopy demonstrating collagenous colitis, previously treated with mesalamine without significant improvement. On October 5, patient underwent skin biopsy secondary to suspicion for vasculitis. Skin biopsy demonstrated vasculitis deemed unrelated due to gastrointestinal findings. Microvascular clotting was detected on biopsy. While he kept undergoing the biopsy, patient woke up at night with significant chest pain. Initially, Triton taxes with out relief. Subsequently tried nitroglycerin which did not help either. Eventually, patient presented to the emergency room where evaluation tech pulmonary embolism.             --CTA Chest, 12/03/16: Filling defects  demonstrated in the left lower lobe in subsegmental pulmonary arteries consistent with pulmonary emboli. RA/RV ratio 1.294, Consistent with possible right heart strain.             --Labs, 12/04/16:  DRVVT 27.8, AT 106%, PrC 172%, PrC Ag 161%, PrS 85%, PrS Ag 69%, Homocysteine 8.5             --Labs, 12/12/16: DRVVT, Anticardiolipin Ab, beta-2 Glycoprotein Ab -- negative; C3 126, C4 23, CH50 >60, ANA -- negative             --Treatment:                          --Heparin gtt, 10/16-18/18                         -- Apixaban (Eliquis), 12/05/16-05/06/17:  --Doppler US BL UE/LE, 01/16/17: No current evidence of deep vein thrombosis in the upper or lower extremities.   --Coumadin, 05/06/17-   Medical History: Past Medical History:  Diagnosis Date  . Anemia   . Anxiety   . Arthritis   . Depression   . Dysrhythmia    occ due to caffeine intake  . Encounter for long-term (current) use of other medications   . GERD (gastroesophageal reflux disease)   . Hepatitis   . History of hiatal hernia   . Hypertension   . IBS (irritable bowel syndrome)   . Obesity   . Osteoarthritis   . Pain in joint, multiple sites   . Pneumonia   . PONV (postoperative nausea and vomiting)    in the 1980's no problems since  . Psoriatic arthropathy ([place])   . Pulmonary embolism ([place]) 12/03/2016   submassive/notes 12/03/2016  . Raynaud disease     Surgical History: Past Surgical History:  Procedure Laterality Date  . ABDOMINAL HYSTERECTOMY    . COLONOSCOPY    . GASTRIC BYPASS    . HALLUX VALGUS CORRECTION  09/2010   Dr Arnetha Courser; right 1st ray  . HERNIA REPAIR    . JOINT REPLACEMENT     left knee times 3  . LAPAROSCOPIC CHOLECYSTECTOMY  04/2000   Archie Endo 07/03/2010  . LAPAROSCOPIC INCISIONAL / UMBILICAL / VENTRAL HERNIA REPAIR  09/2002   [facility]/notes 07/03/2010  . REVISION TOTAL KNEE ARTHROPLASTY Left 10/2003   Archie Endo 07/03/2010  . REVISION TOTAL KNEE ARTHROPLASTY Left 02/2007   Archie Endo 06/21/2010  . SHOULDER  HEMI-ARTHROPLASTY Right 06/28/2016   Procedure: RIGHT SHOULDER HEMI-ARTHROPLASTY;  Surgeon: Netta Cedars, MD;  Location: [facility];  Service: Orthopedics;  Laterality: Right;  . TOTAL KNEE ARTHROPLASTY Left 10/1999   Archie Endo 07/03/2010  . TUBAL LIGATION      Family History: Family History  Problem Relation Age of Onset  . Depression Mother   . Deep vein thrombosis Father     Social History: Social History   Socioeconomic History  . Marital status: Married    Spouse name: Not on file  . Number of children: Not on file  . Years of education: Not on file  . Highest education level: Not on file  Occupational History  . Not on file  Social Needs  . Financial resource strain: Not on file  . Food insecurity:    Worry: Not on file    Inability: Not on file  . Transportation needs:    Medical: Not on file    Non-medical: Not on file  Tobacco Use  . Smoking status: Never Smoker  . Smokeless tobacco: Never Used  Substance and Sexual Activity  . Alcohol use: No  . Drug use: No  . Sexual activity: Not on file  Lifestyle  . Physical activity:  Days per week: Not on file    Minutes per session: Not on file  . Stress: Not on file  Relationships  . Social connections:    Talks on phone: Not on file    Gets together: Not on file    Attends religious service: Not on file    Active member of club or organization: Not on file    Attends meetings of clubs or organizations: Not on file    Relationship status: Not on file  . Intimate partner violence:    Fear of current or ex partner: Not on file    Emotionally abused: Not on file    Physically abused: Not on file    Forced sexual activity: Not on file  Other Topics Concern  . Not on file  Social History Narrative  . Not on file    Allergies: Allergies  Allergen Reactions  . Vioxx [Rofecoxib] Swelling    EXTREME LEG SWELLING   . Augmentin [Amoxicillin-Pot Clavulanate] Diarrhea  . Bactrim [Sulfamethoxazole-Trimethoprim]  Diarrhea and Nausea And Vomiting  . Banana Itching and Rash  . Cephalosporins Diarrhea and Nausea And Vomiting  . Ciprofloxacin Diarrhea and Nausea And Vomiting  . Coconut Flavor Itching and Rash  . Codeine Nausea Only  . Keflex [Cephalexin] Diarrhea    DOSES > 250 MG RESULTS IN ABD PAIN  . Zithromax [Azithromycin] Diarrhea and Nausea And Vomiting    Medications:  Current Outpatient Medications  Medication Sig Dispense Refill  . ALPRAZolam (XANAX) 1 MG tablet Take 1 tablet (1 mg total) by mouth 2 (two) times daily as needed for anxiety. 45 tablet 5  . benazepril (LOTENSIN) 20 MG tablet TAKE 1 TABLET(20 MG) BY MOUTH DAILY 90 tablet 3  . budesonide (ENTOCORT EC) 3 MG 24 hr capsule Take 9 mg by mouth every morning.    . cetirizine (ZYRTEC) 10 MG tablet Take 10 mg by mouth daily.    . Cholecalciferol (VITAMIN D3) 1000 units CAPS Take 1,000 Units by mouth daily.    . diclofenac (VOLTAREN) 75 MG EC tablet TAKE 1 TABLET BY MOUTH TWICE DAILY AS NEEDED FOR JOINT PAIN 180 tablet 3  . esomeprazole (NEXIUM) 20 MG capsule TAKE 1 CAPSULE(20 MG) BY MOUTH DAILY 90 capsule 0  . fluconazole (DIFLUCAN) 100 MG tablet TAKE 1 TABLET BY MOUTH ONCE DAILY FOR 7 DOSES THEN 1 TABLET BY MOUTH ONCE WEEKLY 21 tablet 3  . fluticasone (FLONASE) 50 MCG/ACT nasal spray Place 2 sprays into both nostrils daily as needed for allergies.  1  . furosemide (LASIX) 20 MG tablet TAKE 1 OR 2 TABLETS BY MOUTH DAILY FOR LOWER EXTREMITY SWELLING as directed prn 180 tablet 3  . valACYclovir (VALTREX) 500 MG tablet Take 1,000 mg by mouth daily as needed (outbreaks). For 3 days as needed  98  . venlafaxine XR (EFFEXOR-XR) 150 MG 24 hr capsule TAKE 1 CAPSULE(150 MG) BY MOUTH DAILY 90 capsule 3  . venlafaxine XR (EFFEXOR-XR) 75 MG 24 hr capsule TAKE 1 CAPSULE BY MOUTH EVERY DAY IN ADDITION TO 150 MG CAPSULE FOR. MAX OF 225 MG PER DAY 90 capsule 3  . warfarin (COUMADIN) 2 MG tablet Take 1 tablet (2 mg total) by mouth daily. (Patient taking  differently: Take 6 mg by mouth daily. ) 60 tablet 1   No current facility-administered medications for this visit.     Review of Systems: Review of Systems  Constitutional: Negative for appetite change, chills, diaphoresis, fatigue and fever.  Respiratory: Positive for chest  tightness and shortness of breath. Negative for hemoptysis.   All other systems reviewed and are negative.    PHYSICAL EXAMINATION Blood pressure (!) 141/56, pulse 71, temperature 97.9 F (36.6 C), temperature source Oral, resp. rate 20, height 4\' 11"  (1.499 m), weight 191 lb 6.4 oz (86.8 kg), SpO2 98 %.  ECOG PERFORMANCE STATUS: 2 - Symptomatic, <50% confined to bed  Physical Exam  Constitutional: She is oriented to person, place, and time and well-developed, well-nourished, and in no distress. No distress.  HENT:  Head: Normocephalic and atraumatic.  Mouth/Throat: Oropharynx is clear and moist. No oropharyngeal exudate.  Eyes: Pupils are equal, round, and reactive to light. Conjunctivae and EOM are normal. No scleral icterus.  Neck: No thyromegaly present.  Cardiovascular: Normal rate, regular rhythm and normal heart sounds.  No murmur heard. Pulmonary/Chest: Effort normal and breath sounds normal. No respiratory distress. She has no wheezes. She has no rales.  Abdominal: Soft. Bowel sounds are normal. She exhibits no distension. There is no tenderness. There is no rebound and no guarding.  Musculoskeletal: She exhibits edema. She exhibits no tenderness.  Bilateral trace lower extremity edema.  Lymphadenopathy:    She has no cervical adenopathy.  Neurological: She is alert and oriented to person, place, and time. She has normal reflexes. No cranial nerve deficit.  Skin: Skin is warm and dry. No rash noted. She is not diaphoretic. No erythema. No pallor.     LABORATORY DATA: I have personally reviewed the data as listed: Anti-coag visit on 05/20/2017  Component Date Value Ref Range Status  . INR  05/20/2017 2.5   Final  Anti-coag visit on 05/16/2017  Component Date Value Ref Range Status  . INR 05/16/2017 1.3   Final       Ardath Sax, MD

## 2017-06-03 ENCOUNTER — Ambulatory Visit (INDEPENDENT_AMBULATORY_CARE_PROVIDER_SITE_OTHER): Payer: Medicare HMO | Admitting: *Deleted

## 2017-06-03 DIAGNOSIS — Z7901 Long term (current) use of anticoagulants: Secondary | ICD-10-CM

## 2017-06-03 LAB — POCT INR: INR: 3.1

## 2017-06-04 ENCOUNTER — Encounter: Payer: Self-pay | Admitting: Family Medicine

## 2017-06-05 DIAGNOSIS — R143 Flatulence: Secondary | ICD-10-CM | POA: Diagnosis not present

## 2017-06-05 DIAGNOSIS — K52831 Collagenous colitis: Secondary | ICD-10-CM | POA: Diagnosis not present

## 2017-06-10 ENCOUNTER — Other Ambulatory Visit: Payer: Self-pay | Admitting: *Deleted

## 2017-06-10 ENCOUNTER — Ambulatory Visit (INDEPENDENT_AMBULATORY_CARE_PROVIDER_SITE_OTHER): Payer: Medicare HMO | Admitting: *Deleted

## 2017-06-10 DIAGNOSIS — Z7901 Long term (current) use of anticoagulants: Secondary | ICD-10-CM

## 2017-06-10 LAB — POCT INR: INR: 2.2

## 2017-06-10 MED ORDER — WARFARIN SODIUM 2 MG PO TABS: ORAL_TABLET | ORAL | 5 refills | Status: DC

## 2017-06-24 ENCOUNTER — Ambulatory Visit (INDEPENDENT_AMBULATORY_CARE_PROVIDER_SITE_OTHER): Payer: Medicare HMO | Admitting: *Deleted

## 2017-06-24 DIAGNOSIS — I2699 Other pulmonary embolism without acute cor pulmonale: Secondary | ICD-10-CM | POA: Diagnosis not present

## 2017-06-24 DIAGNOSIS — Z7901 Long term (current) use of anticoagulants: Secondary | ICD-10-CM | POA: Insufficient documentation

## 2017-06-24 LAB — POCT INR: INR: 2.8

## 2017-06-26 DIAGNOSIS — I7300 Raynaud's syndrome without gangrene: Secondary | ICD-10-CM | POA: Diagnosis not present

## 2017-06-26 DIAGNOSIS — Z79899 Other long term (current) drug therapy: Secondary | ICD-10-CM | POA: Diagnosis not present

## 2017-06-26 DIAGNOSIS — E669 Obesity, unspecified: Secondary | ICD-10-CM | POA: Diagnosis not present

## 2017-06-26 DIAGNOSIS — I208 Other forms of angina pectoris: Secondary | ICD-10-CM | POA: Diagnosis not present

## 2017-06-26 DIAGNOSIS — M0609 Rheumatoid arthritis without rheumatoid factor, multiple sites: Secondary | ICD-10-CM | POA: Diagnosis not present

## 2017-06-26 DIAGNOSIS — Z6835 Body mass index (BMI) 35.0-35.9, adult: Secondary | ICD-10-CM | POA: Diagnosis not present

## 2017-06-26 DIAGNOSIS — M310 Hypersensitivity angiitis: Secondary | ICD-10-CM | POA: Diagnosis not present

## 2017-06-26 DIAGNOSIS — L409 Psoriasis, unspecified: Secondary | ICD-10-CM | POA: Diagnosis not present

## 2017-07-01 ENCOUNTER — Encounter: Payer: Self-pay | Admitting: Family Medicine

## 2017-07-01 DIAGNOSIS — R69 Illness, unspecified: Secondary | ICD-10-CM | POA: Diagnosis not present

## 2017-07-02 ENCOUNTER — Telehealth: Payer: Self-pay | Admitting: Family Medicine

## 2017-07-02 NOTE — Telephone Encounter (Signed)
Medical clearance for dentist form dropped off for at front desk for completion.  Verified that patient section of form has been completed.  Last DOS with PCP was 05/07/17.  Placed form in white team folder to be completed by clinical staff.  [name]

## 2017-07-03 NOTE — Telephone Encounter (Signed)
Clinical info completed on dental form.  Place form in Dr. Verlon Au box for completion.  Ottis Stain, CMA

## 2017-07-04 ENCOUNTER — Ambulatory Visit: Payer: Medicare HMO | Admitting: Interventional Cardiology

## 2017-07-04 ENCOUNTER — Encounter: Payer: Self-pay | Admitting: *Deleted

## 2017-07-04 ENCOUNTER — Encounter: Payer: Self-pay | Admitting: Interventional Cardiology

## 2017-07-04 VITALS — BP 116/80 | HR 73 | Ht 59.0 in | Wt 183.4 lb

## 2017-07-04 DIAGNOSIS — R0609 Other forms of dyspnea: Secondary | ICD-10-CM | POA: Diagnosis not present

## 2017-07-04 DIAGNOSIS — I10 Essential (primary) hypertension: Secondary | ICD-10-CM

## 2017-07-04 DIAGNOSIS — Z7901 Long term (current) use of anticoagulants: Secondary | ICD-10-CM | POA: Diagnosis not present

## 2017-07-04 DIAGNOSIS — L4050 Arthropathic psoriasis, unspecified: Secondary | ICD-10-CM | POA: Diagnosis not present

## 2017-07-04 DIAGNOSIS — R0600 Dyspnea, unspecified: Secondary | ICD-10-CM

## 2017-07-04 DIAGNOSIS — I2699 Other pulmonary embolism without acute cor pulmonale: Secondary | ICD-10-CM | POA: Diagnosis not present

## 2017-07-04 MED ORDER — OLMESARTAN MEDOXOMIL 20 MG PO TABS: 20.0000 mg | ORAL_TABLET | Freq: Every day | ORAL | 3 refills | Status: DC

## 2017-07-04 NOTE — Progress Notes (Signed)
Cardiology Office Note    Date:  07/04/2017   ID:  [name], DOB July 27, 1950, MRN [id number]  PCP:  [name], MD  Cardiologist: Sinclair Grooms, MD   Chief Complaint  Patient presents with  . Chest Pain  . Shortness of Breath    History of Present Illness:  [name] is a 67 y.o. female with history of essential hypertension, pulmonary embolism, chronic anticoagulation therapy, and Raynaud's phenomena being referred for evaluation of dyspnea on exertion.  Possible history of lower extremity vasculitis.  No defined autoimmune disease  [name] had submassive pulmonary embolism October 2018.  Etiology was never clearly defined but felt to be from the lower extremity source.  There was suspicion that she had a vasculitis.  CT demonstrated right heart strain.  Echocardiogram done during the same hospitalization did not reveal right heart enlargement.  Over the past 2 months she has had a dry cough, dyspnea on exertion, and atypical chest pain to similar to that associated with PE.  Both chest pain and dyspnea are exertion related.  Seems to have a variable threshold.  She does not have complaints at rest.  She sleeps on 3 pillows because of reflux.  No significant lower extremity swelling.  Does not know very much about her family history.  Her father died of a massive pulmonary embolism.  Chest pain and dyspnea earlier this spring led to reevaluation by rheumatology to rule out recurrence of vasculitis.  They could not find anything of significance.  Hence the referral to cardiology for evaluation.  She does have three-vessel coronary calcification on CT angiography performed in 2018.  Past Medical History:  Diagnosis Date  . Anemia   . Anxiety   . Arthritis   . Depression   . Dysrhythmia    occ due to caffeine intake  . Encounter for long-term (current) use of other medications   . GERD (gastroesophageal reflux disease)   . Hepatitis   . History of hiatal hernia   .  Hypertension   . IBS (irritable bowel syndrome)   . Obesity   . Osteoarthritis   . Pain in joint, multiple sites   . Pneumonia   . PONV (postoperative nausea and vomiting)    in the 1980's no problems since  . Psoriatic arthropathy ([place])   . Pulmonary embolism ([name]) 12/03/2016   submassive/notes 12/03/2016  . Raynaud disease     Past Surgical History:  Procedure Laterality Date  . ABDOMINAL HYSTERECTOMY    . COLONOSCOPY    . GASTRIC BYPASS    . HALLUX VALGUS CORRECTION  09/2010   Dr Arnetha Courser; right 1st ray  . HERNIA REPAIR    . JOINT REPLACEMENT     left knee times 3  . LAPAROSCOPIC CHOLECYSTECTOMY  04/2000   Archie Endo 07/03/2010  . LAPAROSCOPIC INCISIONAL / UMBILICAL / VENTRAL HERNIA REPAIR  09/2002   [facility]/notes 07/03/2010  . REVISION TOTAL KNEE ARTHROPLASTY Left 10/2003   Archie Endo 07/03/2010  . REVISION TOTAL KNEE ARTHROPLASTY Left 02/2007   Archie Endo 06/21/2010  . SHOULDER HEMI-ARTHROPLASTY Right 06/28/2016   Procedure: RIGHT SHOULDER HEMI-ARTHROPLASTY;  Surgeon: Netta Cedars, MD;  Location: [facility];  Service: Orthopedics;  Laterality: Right;  . TOTAL KNEE ARTHROPLASTY Left 10/1999   Archie Endo 07/03/2010  . TUBAL LIGATION      Current Medications: Outpatient Medications Prior to Visit  Medication Sig Dispense Refill  . ALPRAZolam (XANAX) 1 MG tablet Take 1 tablet (1 mg total) by mouth 2 (  two) times daily as needed for anxiety. 45 tablet 5  . cetirizine (ZYRTEC) 10 MG tablet Take 10 mg by mouth daily.    . Cholecalciferol (VITAMIN D3) 1000 units CAPS Take 1,000 Units by mouth daily.    . diclofenac (VOLTAREN) 75 MG EC tablet TAKE 1 TABLET BY MOUTH TWICE DAILY AS NEEDED FOR JOINT PAIN 180 tablet 3  . esomeprazole (NEXIUM) 20 MG capsule TAKE 1 CAPSULE(20 MG) BY MOUTH DAILY 90 capsule 0  . fluconazole (DIFLUCAN) 100 MG tablet TAKE 1 TABLET BY MOUTH ONCE DAILY FOR 7 DOSES THEN 1 TABLET BY MOUTH ONCE WEEKLY 21 tablet 3  . fluticasone (FLONASE) 50 MCG/ACT nasal spray Place 2 sprays into both  nostrils daily as needed for allergies.  1  . furosemide (LASIX) 20 MG tablet TAKE 1 OR 2 TABLETS BY MOUTH DAILY FOR LOWER EXTREMITY SWELLING as directed prn 180 tablet 3  . valACYclovir (VALTREX) 500 MG tablet Take 1,000 mg by mouth daily as needed (outbreaks). For 3 days as needed  98  . venlafaxine XR (EFFEXOR-XR) 150 MG 24 hr capsule TAKE 1 CAPSULE(150 MG) BY MOUTH DAILY 90 capsule 3  . warfarin (COUMADIN) 2 MG tablet Take 4 mg (2 tabs) Tues, Thurs, Sat Take 6 mg (3 tabs) Sun, Mon, Wed, Fri 90 tablet 5  . benazepril (LOTENSIN) 20 MG tablet TAKE 1 TABLET(20 MG) BY MOUTH DAILY 90 tablet 3  . budesonide (ENTOCORT EC) 3 MG 24 hr capsule Take 9 mg by mouth every morning.    . venlafaxine XR (EFFEXOR-XR) 75 MG 24 hr capsule TAKE 1 CAPSULE BY MOUTH EVERY DAY IN ADDITION TO 150 MG CAPSULE FOR. MAX OF 225 MG PER DAY (Patient not taking: Reported on 07/04/2017) 90 capsule 3   No facility-administered medications prior to visit.      Allergies:   Vioxx [rofecoxib]; Augmentin [amoxicillin-pot clavulanate]; Bactrim [sulfamethoxazole-trimethoprim]; Banana; Cephalosporins; Ciprofloxacin; Coconut flavor; Codeine; Keflex [cephalexin]; and Zithromax [azithromycin]   Social History   Socioeconomic History  . Marital status: Married    Spouse name: Not on file  . Number of children: Not on file  . Years of education: Not on file  . Highest education level: Not on file  Occupational History  . Not on file  Social Needs  . Financial resource strain: Not on file  . Food insecurity:    Worry: Not on file    Inability: Not on file  . Transportation needs:    Medical: Not on file    Non-medical: Not on file  Tobacco Use  . Smoking status: Never Smoker  . Smokeless tobacco: Never Used  Substance and Sexual Activity  . Alcohol use: No  . Drug use: No  . Sexual activity: Not on file  Lifestyle  . Physical activity:    Days per week: Not on file    Minutes per session: Not on file  . Stress: Not on  file  Relationships  . Social connections:    Talks on phone: Not on file    Gets together: Not on file    Attends religious service: Not on file    Active member of club or organization: Not on file    Attends meetings of clubs or organizations: Not on file    Relationship status: Not on file  Other Topics Concern  . Not on file  Social History Narrative  . Not on file     Family History:  The patient's family history includes Deep vein thrombosis in  her father; Depression in her mother.   ROS:   Please see the history of present illness.    History of ACE inhibitor induced coughLeg swelling, difficulty with hearing, abdominal discomfort, diarrhea, depression, muscle pain, rash, dizziness, headaches, joint swelling, anxiety, constipation, irregular heartbeat, excessive fatigue and excessive sweating.  Was diagnosed with collagenous colitis because of 30 pound weight loss and was associated with diarrhea in the summer 2018.  After the diagnosis was made she subsequently had pulmonary emboli. All other systems reviewed and are negative.   PHYSICAL EXAM:   VS:  BP 116/80   Pulse 73   Ht 4\' 11"  (1.499 m)   Wt 183 lb 6.4 oz (83.2 kg)   BMI 37.04 kg/m    GEN: Well nourished, well developed, in no acute distress.  Moderate obesity. HEENT: normal  Neck: no JVD, carotid bruits, or masses Cardiac: RRR; no murmurs, rubs, or gallops,no edema.  Bilateral varicose veins. Respiratory:  clear to auscultation bilaterally, normal work of breathing GI: soft, nontender, nondistended, + BS MS: no deformity or atrophy  Skin: warm and dry, no rash Neuro:  Alert and Oriented x 3, Strength and sensation are intact Psych: euthymic mood, full affect  Wt Readings from Last 3 Encounters:  07/04/17 183 lb 6.4 oz (83.2 kg)  05/20/17 191 lb 6.4 oz (86.8 kg)  05/07/17 190 lb 3.2 oz (86.3 kg)      Studies/Labs Reviewed:   EKG:  EKG sinus rhythm, low voltage, and otherwise normal.  Recent  Labs: 12/04/2016: ALT 43; Hemoglobin 11.9; Platelets 262 12/05/2016: BUN 10; Creatinine, Ser 0.59; Potassium 3.8; Sodium 138   Lipid Panel    Component Value Date/Time   CHOL 173 10/23/2016 0953   TRIG 164 (H) 10/23/2016 0953   HDL 50 10/23/2016 0953   CHOLHDL 3.5 10/23/2016 0953   CHOLHDL 4.0 12/25/2011 0859   VLDL 28 12/25/2011 0859   LDLCALC 90 10/23/2016 0953   LDLDIRECT 139 (H) 08/02/2015 1236    Additional studies/ records that were reviewed today include:   2D Doppler echocardiogram 12/04/2017: Study Conclusions   - Left ventricle: The cavity size was normal. Wall thickness was   normal. Systolic function was normal. The estimated ejection   fraction was in the range of 55% to 60%. Wall motion was normal;   there were no regional wall motion abnormalities. Doppler   parameters are consistent with abnormal left ventricular   relaxation (grade 1 diastolic dysfunction).   Impressions:   - Definity used; normal LV systolic function; mild diastolic   dysfunction; calcified papillary muscle noted.   CT Angio Chest 11/2017:  Study Conclusions   - Left ventricle: The cavity size was normal. Wall thickness was   normal. Systolic function was normal. The estimated ejection   fraction was in the range of 55% to 60%. Wall motion was normal;   there were no regional wall motion abnormalities. Doppler   parameters are consistent with abnormal left ventricular   relaxation (grade 1 diastolic dysfunction).   Impressions:   - Definity used; normal LV systolic function; mild diastolic   dysfunction; calcified papillary muscle noted.     ASSESSMENT:    1. Dyspnea on exertion   2. Psoriatic arthritis ([place])   3. PE (pulmonary thromboembolism) ([place])   4. Long term (current) use of anticoagulants   5. Essential hypertension      PLAN:  In order of problems listed above:  1.  Need to exclude an ischemic equivalent especially in the  setting of chest pain on exertion.   There is no clinical evidence of volume overload or right heart failure.  Nuclear scintigraphy will be performed to rule out ischemia.  LV function will also be assessed. 2. Not discussed 3. She is on Coumadin therapy and I doubt recurrent PE 4. Coumadin therapy after coming off of apixaban. 5. Blood pressure being treated with benazepril 20 mg/day and furosemide.  She is complaining of a chronic hacking cough.  Switch benazepril to olmesartan 20 mg/day.  Clinic follow-up will be dependent upon findings.  Follow-up on cough with medication switch.  May need to have electrolytes repeated.  Medication Adjustments/Labs and Tests Ordered: Current medicines are reviewed at length with the patient today.  Concerns regarding medicines are outlined above.  Medication changes, Labs and Tests ordered today are listed in the Patient Instructions below. Patient Instructions  Medication Instructions:  1) DISCONTINUE Benazepril  2) START Benicar 20mg  once daily  Labwork: None  Testing/Procedures: Your physician has requested that you have a lexiscan myoview. For further information please visit [email or web address]. Please follow instruction sheet, as given.   Follow-Up: Your physician recommends that you schedule a follow-up appointment as needed with Dr. Tamala Julian.   Any Other Special Instructions Will Be Listed Below (If Applicable).     If you need a refill on your cardiac medications before your next appointment, please call your pharmacy.      Signed, Sinclair Grooms, MD  07/04/2017 3:50 PM    [facility] [address], [place], [place]  [address] Phone: [phone]; Fax: [phone]

## 2017-07-04 NOTE — Patient Instructions (Addendum)
Medication Instructions:  1) DISCONTINUE Benazepril  2) START Benicar 20mg  once daily  Labwork: None  Testing/Procedures: Your physician has requested that you have a lexiscan myoview. For further information please visit [email or web address]. Please follow instruction sheet, as given.   Follow-Up: Your physician recommends that you schedule a follow-up appointment as needed with Dr. Tamala Julian.   Any Other Special Instructions Will Be Listed Below (If Applicable).     If you need a refill on your cardiac medications before your next appointment, please call your pharmacy.

## 2017-07-07 ENCOUNTER — Telehealth (HOSPITAL_COMMUNITY): Payer: Self-pay | Admitting: *Deleted

## 2017-07-07 ENCOUNTER — Telehealth (HOSPITAL_COMMUNITY): Payer: Self-pay

## 2017-07-07 NOTE — Telephone Encounter (Signed)
Patient given detailed instructions per Myocardial Perfusion Study Information Sheet for the test on 07/09/17 at 0800. Patient notified to arrive 15 minutes early and that it is imperative to arrive on time for appointment to keep from having the test rescheduled.  If you need to cancel or reschedule your appointment, please call the office within 24 hours of your appointment. . Patient verbalized understanding. TMY

## 2017-07-07 NOTE — Telephone Encounter (Signed)
Left message on voicemail in reference to upcoming appointment scheduled for 07/09/17. Phone number given for a call back so details instructions can be given. Wally Behan Jacqueline   

## 2017-07-08 ENCOUNTER — Encounter: Payer: Self-pay | Admitting: Family Medicine

## 2017-07-08 ENCOUNTER — Ambulatory Visit (INDEPENDENT_AMBULATORY_CARE_PROVIDER_SITE_OTHER): Payer: Medicare HMO | Admitting: *Deleted

## 2017-07-08 DIAGNOSIS — I2699 Other pulmonary embolism without acute cor pulmonale: Secondary | ICD-10-CM | POA: Diagnosis not present

## 2017-07-08 DIAGNOSIS — Z7901 Long term (current) use of anticoagulants: Secondary | ICD-10-CM

## 2017-07-08 LAB — POCT INR: INR: 1.7 — AB (ref 2.0–3.0)

## 2017-07-08 NOTE — Telephone Encounter (Signed)
Dental form faxed back No need for prophylactic antibiotics. No need to stop coumadin if INR is <3.5 per article below. I have filled out form. She should get her INR checked day before or day of. Do not hold coumadin. Clin [employer] Investig Dent. 2014; 6: 65-69.  Published online 2014 Aug 19. doi: 10.2147/CCIDE.Q33007  PMCID: [id number]  PMID: [id number]  Dental extraction in patients on warfarin

## 2017-07-09 ENCOUNTER — Ambulatory Visit (HOSPITAL_COMMUNITY): Payer: Medicare HMO | Attending: Internal Medicine

## 2017-07-09 DIAGNOSIS — R0600 Dyspnea, unspecified: Secondary | ICD-10-CM

## 2017-07-09 DIAGNOSIS — R0609 Other forms of dyspnea: Secondary | ICD-10-CM

## 2017-07-09 LAB — MYOCARDIAL PERFUSION IMAGING
CHL CUP NUCLEAR SDS: 3
CHL CUP NUCLEAR SRS: 3
CHL CUP NUCLEAR SSS: 6
LV dias vol: 61 mL (ref 46–106)
LV sys vol: 12 mL
Peak HR: 101 {beats}/min
RATE: 0.37
Rest HR: 67 {beats}/min
TID: 0.82

## 2017-07-09 MED ORDER — TECHNETIUM TC 99M TETROFOSMIN IV KIT
30.8000 | PACK | Freq: Once | INTRAVENOUS | Status: AC | PRN
  Administered 2017-07-09: 30.8 via INTRAVENOUS
  Filled 2017-07-09: qty 31

## 2017-07-09 MED ORDER — REGADENOSON 0.4 MG/5ML IV SOLN
0.4000 mg | Freq: Once | INTRAVENOUS | Status: AC
  Administered 2017-07-09: 0.4 mg via INTRAVENOUS

## 2017-07-09 MED ORDER — TECHNETIUM TC 99M TETROFOSMIN IV KIT
9.2000 | PACK | Freq: Once | INTRAVENOUS | Status: AC | PRN
  Administered 2017-07-09: 9.2 via INTRAVENOUS
  Filled 2017-07-09: qty 10

## 2017-07-10 DIAGNOSIS — R69 Illness, unspecified: Secondary | ICD-10-CM | POA: Diagnosis not present

## 2017-07-12 ENCOUNTER — Encounter: Payer: Self-pay | Admitting: Interventional Cardiology

## 2017-07-15 ENCOUNTER — Ambulatory Visit (INDEPENDENT_AMBULATORY_CARE_PROVIDER_SITE_OTHER): Payer: Medicare HMO | Admitting: *Deleted

## 2017-07-15 DIAGNOSIS — I2699 Other pulmonary embolism without acute cor pulmonale: Secondary | ICD-10-CM | POA: Diagnosis not present

## 2017-07-15 DIAGNOSIS — Z7901 Long term (current) use of anticoagulants: Secondary | ICD-10-CM

## 2017-07-15 LAB — POCT INR: INR: 1.8 — AB (ref 2.0–3.0)

## 2017-07-22 ENCOUNTER — Ambulatory Visit (INDEPENDENT_AMBULATORY_CARE_PROVIDER_SITE_OTHER): Payer: Medicare HMO | Admitting: *Deleted

## 2017-07-22 DIAGNOSIS — Z7901 Long term (current) use of anticoagulants: Secondary | ICD-10-CM

## 2017-07-22 LAB — POCT INR: INR: 1.5 — AB (ref 2.0–3.0)

## 2017-07-29 ENCOUNTER — Other Ambulatory Visit: Payer: Self-pay | Admitting: *Deleted

## 2017-07-29 ENCOUNTER — Ambulatory Visit (INDEPENDENT_AMBULATORY_CARE_PROVIDER_SITE_OTHER): Payer: Medicare HMO | Admitting: *Deleted

## 2017-07-29 DIAGNOSIS — I2699 Other pulmonary embolism without acute cor pulmonale: Secondary | ICD-10-CM | POA: Diagnosis not present

## 2017-07-29 DIAGNOSIS — Z7901 Long term (current) use of anticoagulants: Secondary | ICD-10-CM | POA: Diagnosis not present

## 2017-07-29 LAB — POCT INR: INR: 2.5 (ref 2.0–3.0)

## 2017-07-30 MED ORDER — WARFARIN SODIUM 2 MG PO TABS: ORAL_TABLET | ORAL | 5 refills | Status: DC

## 2017-08-12 ENCOUNTER — Ambulatory Visit (INDEPENDENT_AMBULATORY_CARE_PROVIDER_SITE_OTHER): Payer: Medicare HMO | Admitting: *Deleted

## 2017-08-12 DIAGNOSIS — Z7901 Long term (current) use of anticoagulants: Secondary | ICD-10-CM

## 2017-08-12 DIAGNOSIS — I2699 Other pulmonary embolism without acute cor pulmonale: Secondary | ICD-10-CM

## 2017-08-12 LAB — POCT INR: INR: 4.2 — AB (ref 2.0–3.0)

## 2017-08-14 ENCOUNTER — Telehealth: Payer: Medicare HMO | Admitting: Family

## 2017-08-14 DIAGNOSIS — B9689 Other specified bacterial agents as the cause of diseases classified elsewhere: Secondary | ICD-10-CM

## 2017-08-14 DIAGNOSIS — J028 Acute pharyngitis due to other specified organisms: Secondary | ICD-10-CM

## 2017-08-14 MED ORDER — PREDNISONE 5 MG PO TABS: 5.0000 mg | ORAL_TABLET | ORAL | 0 refills | Status: DC

## 2017-08-14 MED ORDER — BENZONATATE 100 MG PO CAPS: 100.0000 mg | ORAL_CAPSULE | Freq: Three times a day (TID) | ORAL | 0 refills | Status: DC | PRN

## 2017-08-14 MED ORDER — ALBUTEROL SULFATE HFA 108 (90 BASE) MCG/ACT IN AERS
2.0000 | INHALATION_SPRAY | Freq: Four times a day (QID) | RESPIRATORY_TRACT | 2 refills | Status: DC | PRN
Start: 2017-08-14 — End: 2020-05-04

## 2017-08-14 MED ORDER — AMOXICILLIN 500 MG PO CAPS: 500.0000 mg | ORAL_CAPSULE | Freq: Three times a day (TID) | ORAL | 0 refills | Status: DC

## 2017-08-14 NOTE — Progress Notes (Signed)
Thank you for the details you included in the comment boxes. Those details are very helpful in determining the best course of treatment for you and help [place] to provide the best care. See plan below. If this continues to worsen over the next few days in spite of this treatment, please be seen face-to-face given the length of time the cough has lingered.  We are sorry that you are not feeling well.  Here is how we plan to help!  Based on your presentation I believe you most likely have A cough due to bacteria.  When patients have a fever and a productive cough with a change in color or increased sputum production, we are concerned about bacterial bronchitis.  If left untreated it can progress to pneumonia.  If your symptoms do not improve with your treatment plan it is important that you contact your provider.    In addition you may use A non-prescription cough medication called Mucinex DM: take 2 tablets every 12 hours. and A prescription cough medication called Tessalon Perles 100mg . You may take 1-2 capsules every 8 hours as needed for your cough.  I am sending Amoxicillin 500mg  by mouth 3 times daily for 7 days along with an Albuterol inhaler which you can use 2 puffs every 6 hours for shortness of breath.   Prednisone 5 mg daily for 6 days (see taper instructions below)   Directions for 6 day taper: Day 1: 2 tablets before breakfast, 1 after both lunch & dinner and 2 at bedtime Day 2: 1 tab before breakfast, 1 after both lunch & dinner and 2 at bedtime Day 3: 1 tab at each meal & 1 at bedtime Day 4: 1 tab at breakfast, 1 at lunch, 1 at bedtime Day 5: 1 tab at breakfast & 1 tab at bedtime Day 6: 1 tab at breakfast   From your responses in the eVisit questionnaire you describe inflammation in the upper respiratory tract which is causing a significant cough.  This is commonly called Bronchitis and has four common causes:    Allergies  Viral Infections  Acid Reflux  Bacterial  Infection Allergies, viruses and acid reflux are treated by controlling symptoms or eliminating the cause. An example might be a cough caused by taking certain blood pressure medications. You stop the cough by changing the medication. Another example might be a cough caused by acid reflux. Controlling the reflux helps control the cough.  USE OF BRONCHODILATOR ("RESCUE") INHALERS: There is a risk from using your bronchodilator too frequently.  The risk is that over-reliance on a medication which only relaxes the muscles surrounding the breathing tubes can reduce the effectiveness of medications prescribed to reduce swelling and congestion of the tubes themselves.  Although you feel brief relief from the bronchodilator inhaler, your asthma may actually be worsening with the tubes becoming more swollen and filled with mucus.  This can delay other crucial treatments, such as oral steroid medications. If you need to use a bronchodilator inhaler daily, several times per day, you should discuss this with your provider.  There are probably better treatments that could be used to keep your asthma under control.     HOME CARE . Only take medications as instructed by your medical team. . Complete the entire course of an antibiotic. . Drink plenty of fluids and get plenty of rest. . Avoid close contacts especially the very young and the elderly . Cover your mouth if you cough or cough into your sleeve. Marland Kitchen  Always remember to wash your hands . A steam or ultrasonic humidifier can help congestion.   GET HELP RIGHT AWAY IF: . You develop worsening fever. . You become short of breath . You cough up blood. . Your symptoms persist after you have completed your treatment plan MAKE SURE YOU   Understand these instructions.  Will watch your condition.  Will get help right away if you are not doing well or get worse.  Your e-visit answers were reviewed by a board certified advanced clinical practitioner to  complete your personal care plan.  Depending on the condition, your plan could have included both over the counter or prescription medications. If there is a problem please reply  once you have received a response from your provider. Your safety is important to [place].  If you have drug allergies check your prescription carefully.    You can use MyChart to ask questions about today's visit, request a non-urgent call back, or ask for a work or school excuse for 24 hours related to this e-Visit. If it has been greater than 24 hours you will need to follow up with your provider, or enter a new e-Visit to address those concerns. You will get an e-mail in the next two days asking about your experience.  I hope that your e-visit has been valuable and will speed your recovery. Thank you for using e-visits.

## 2017-08-22 ENCOUNTER — Ambulatory Visit (INDEPENDENT_AMBULATORY_CARE_PROVIDER_SITE_OTHER): Payer: Medicare HMO | Admitting: *Deleted

## 2017-08-22 DIAGNOSIS — Z7901 Long term (current) use of anticoagulants: Secondary | ICD-10-CM | POA: Diagnosis not present

## 2017-08-22 DIAGNOSIS — I2699 Other pulmonary embolism without acute cor pulmonale: Secondary | ICD-10-CM

## 2017-08-22 LAB — POCT INR: INR: 2.3 (ref 2.0–3.0)

## 2017-09-04 DIAGNOSIS — L409 Psoriasis, unspecified: Secondary | ICD-10-CM | POA: Diagnosis not present

## 2017-09-04 DIAGNOSIS — L4050 Arthropathic psoriasis, unspecified: Secondary | ICD-10-CM | POA: Diagnosis not present

## 2017-09-04 DIAGNOSIS — I7300 Raynaud's syndrome without gangrene: Secondary | ICD-10-CM | POA: Diagnosis not present

## 2017-09-04 DIAGNOSIS — M2550 Pain in unspecified joint: Secondary | ICD-10-CM | POA: Diagnosis not present

## 2017-09-04 DIAGNOSIS — Z79899 Other long term (current) drug therapy: Secondary | ICD-10-CM | POA: Diagnosis not present

## 2017-09-04 DIAGNOSIS — Z6835 Body mass index (BMI) 35.0-35.9, adult: Secondary | ICD-10-CM | POA: Diagnosis not present

## 2017-09-04 DIAGNOSIS — E669 Obesity, unspecified: Secondary | ICD-10-CM | POA: Diagnosis not present

## 2017-09-04 DIAGNOSIS — M310 Hypersensitivity angiitis: Secondary | ICD-10-CM | POA: Diagnosis not present

## 2017-09-04 DIAGNOSIS — M0609 Rheumatoid arthritis without rheumatoid factor, multiple sites: Secondary | ICD-10-CM | POA: Diagnosis not present

## 2017-09-05 ENCOUNTER — Ambulatory Visit (INDEPENDENT_AMBULATORY_CARE_PROVIDER_SITE_OTHER): Payer: Medicare HMO | Admitting: *Deleted

## 2017-09-05 DIAGNOSIS — I2699 Other pulmonary embolism without acute cor pulmonale: Secondary | ICD-10-CM

## 2017-09-05 DIAGNOSIS — Z7901 Long term (current) use of anticoagulants: Secondary | ICD-10-CM | POA: Diagnosis not present

## 2017-09-05 LAB — POCT INR: INR: 2.2 (ref 2.0–3.0)

## 2017-09-15 ENCOUNTER — Other Ambulatory Visit: Payer: Self-pay

## 2017-09-16 MED ORDER — VALACYCLOVIR HCL 500 MG PO TABS: ORAL_TABLET | ORAL | 5 refills | Status: DC

## 2017-09-17 ENCOUNTER — Encounter: Payer: Self-pay | Admitting: Family Medicine

## 2017-09-17 ENCOUNTER — Other Ambulatory Visit: Payer: Self-pay

## 2017-09-17 ENCOUNTER — Ambulatory Visit (INDEPENDENT_AMBULATORY_CARE_PROVIDER_SITE_OTHER): Payer: Medicare HMO | Admitting: Family Medicine

## 2017-09-17 DIAGNOSIS — Z7901 Long term (current) use of anticoagulants: Secondary | ICD-10-CM

## 2017-09-17 DIAGNOSIS — F341 Dysthymic disorder: Secondary | ICD-10-CM

## 2017-09-17 DIAGNOSIS — I2699 Other pulmonary embolism without acute cor pulmonale: Secondary | ICD-10-CM

## 2017-09-17 DIAGNOSIS — R69 Illness, unspecified: Secondary | ICD-10-CM | POA: Diagnosis not present

## 2017-09-17 DIAGNOSIS — I10 Essential (primary) hypertension: Secondary | ICD-10-CM | POA: Diagnosis not present

## 2017-09-17 LAB — POCT INR: INR: 1.5 — AB (ref 2.0–3.0)

## 2017-09-17 MED ORDER — AMLODIPINE BESYLATE 5 MG PO TABS
5.0000 mg | ORAL_TABLET | Freq: Every day | ORAL | 1 refills | Status: DC
Start: 2017-09-17 — End: 2017-10-28

## 2017-09-18 NOTE — Progress Notes (Signed)
    CHIEF COMPLAINT / HPI: 1.  Hypertension: Continues to have cough.  Her cardiologist changed her from benazepril to on losartan and the cough improved but is still present. 2.  Continues to have multiple areas of arthralgias.  Her rheumatologist put her back on once daily dosing of oral Voltaren she is tolerating that well currently 3.  Has questions about some blood work she had done at the rheumatologist office which showed mildly elevated liver functions  REVIEW OF SYSTEMS: Denies chest pain.  No shortness of breath.  Cough as per HPI.  No lower extremity edema.  No abdominal pain.  No constipation or diarrhea.  Energy level stable.  Arthralgias as per HPI.  PERTINENT  PMH / PSH: I have reviewed the patient's medications, allergies, past medical and surgical history, smoking status and updated in the EMR as appropriate. Had colonoscopy done last summer for screening they told her did not need to have another one for 10 years.  I do not have the report currently.  OBJECTIVE:  Vital signs reviewed. GENERAL: Well-developed, well-nourished, no acute distress. CARDIOVASCULAR: Regular rate and rhythm no murmur gallop or rub LUNGS: Clear to auscultation bilaterally, no rales or wheeze. ABDOMEN: Soft positive bowel sounds NEURO: No gross focal neurological deficits. MSK: Movement of extremity x 4.    ASSESSMENT / PLAN:  Essential hypertension We talked extensively about blood pressure management spending greater than 50% of our 25-minute office visit in counseling education regarding his issues.  Decided to switch to amlodipine and have her follow-up 1 month.  Sounds like she was still having some cough on the ARB.  PE (pulmonary thromboembolism) ([place]) Remains on warfarin.  INR today and adjust as needed.  Dysthymia Currently stable continue current medication regimen.  Follow-up for this nuclear issue 3 to 6 months.

## 2017-09-18 NOTE — Assessment & Plan Note (Signed)
Remains on warfarin.  INR today and adjust as needed.

## 2017-09-18 NOTE — Assessment & Plan Note (Signed)
Currently stable continue current medication regimen.  Follow-up for this nuclear issue 3 to 6 months.

## 2017-09-18 NOTE — Assessment & Plan Note (Signed)
We talked extensively about blood pressure management spending greater than 50% of our 25-minute office visit in counseling education regarding his issues.  Decided to switch to amlodipine and have her follow-up 1 month.  Sounds like she was still having some cough on the ARB.

## 2017-09-26 ENCOUNTER — Ambulatory Visit: Payer: Medicare HMO

## 2017-09-26 ENCOUNTER — Encounter: Payer: Self-pay | Admitting: Family Medicine

## 2017-09-30 ENCOUNTER — Ambulatory Visit (INDEPENDENT_AMBULATORY_CARE_PROVIDER_SITE_OTHER): Payer: Medicare HMO | Admitting: *Deleted

## 2017-09-30 DIAGNOSIS — Z7901 Long term (current) use of anticoagulants: Secondary | ICD-10-CM

## 2017-09-30 DIAGNOSIS — I2699 Other pulmonary embolism without acute cor pulmonale: Secondary | ICD-10-CM

## 2017-09-30 LAB — POCT INR: INR: 1.5 — AB (ref 2.0–3.0)

## 2017-10-01 ENCOUNTER — Ambulatory Visit: Payer: Medicare HMO

## 2017-10-03 DIAGNOSIS — R945 Abnormal results of liver function studies: Secondary | ICD-10-CM | POA: Diagnosis not present

## 2017-10-08 ENCOUNTER — Ambulatory Visit (INDEPENDENT_AMBULATORY_CARE_PROVIDER_SITE_OTHER): Payer: Medicare HMO | Admitting: *Deleted

## 2017-10-08 ENCOUNTER — Telehealth: Payer: Self-pay

## 2017-10-08 DIAGNOSIS — I2699 Other pulmonary embolism without acute cor pulmonale: Secondary | ICD-10-CM | POA: Diagnosis not present

## 2017-10-08 DIAGNOSIS — Z7901 Long term (current) use of anticoagulants: Secondary | ICD-10-CM | POA: Diagnosis not present

## 2017-10-08 LAB — POCT INR: INR: 5.3 — AB (ref 2.0–3.0)

## 2017-10-08 LAB — PROTIME-INR
INR: 5.7 (ref 0.8–1.2)
Prothrombin Time: 54.2 s — ABNORMAL HIGH (ref 9.1–12.0)

## 2017-10-08 NOTE — Telephone Encounter (Signed)
[facility] calling with stat lab for pt. INR 5.7. Given to [place] in lab. Labs were repeat from this am in office. Wallace Cullens, RN

## 2017-10-08 NOTE — Progress Notes (Signed)
Critical result reported to Dr Nori Riis at 11:15 on 10/08/2017. Patient was instructed to hold her coumadin today and tomorrow. She will then take 4 mg on Mon and Thurs and 6 mg all other days. Follow up in 1 week. Adron Geisel, Kevin Fenton

## 2017-10-15 ENCOUNTER — Other Ambulatory Visit (INDEPENDENT_AMBULATORY_CARE_PROVIDER_SITE_OTHER): Payer: Medicare HMO | Admitting: *Deleted

## 2017-10-15 DIAGNOSIS — Z7901 Long term (current) use of anticoagulants: Secondary | ICD-10-CM

## 2017-10-15 DIAGNOSIS — I2699 Other pulmonary embolism without acute cor pulmonale: Secondary | ICD-10-CM

## 2017-10-15 LAB — PROTIME-INR
INR: 1.9 — AB (ref 0.8–1.2)
PROTHROMBIN TIME: 19 s — AB (ref 9.1–12.0)

## 2017-10-15 NOTE — Telephone Encounter (Signed)
From: TERRYANN VERBEEK  Sent: 10/13/2017  3:29 PM EDT  To: [facility] Triage  Subject: Non-Urgent Medical Question             Hi Dr. Tamala Julian, I wanted to ask if you would approve a visit for our daughter. [name] [place], [place] 03-10-1978. She is having some SOB and chest tightness with minimal exertion. She also said her jaw on the left side aches and twitches at time. She has a history of sleep apnea but doesn't use a C-pap any longer. She had weight reduction surgery a couple of years ago and lost about 60 pounds but has gained most of that back, although I would never say that to her. Given her family history we are concerned. It may be stress related but we would like her to see you if possible to be sure. She actually asked me if I thought you would see her and I told her I could ask. Thanks so much and just let me know and I'll tell her. If you can't see her though it's fine, I understand you are a busy person. Have a great week.     Is there a way for me to see her in September?

## 2017-10-22 ENCOUNTER — Ambulatory Visit (INDEPENDENT_AMBULATORY_CARE_PROVIDER_SITE_OTHER): Payer: Medicare HMO | Admitting: *Deleted

## 2017-10-22 DIAGNOSIS — Z7901 Long term (current) use of anticoagulants: Secondary | ICD-10-CM

## 2017-10-22 DIAGNOSIS — I2699 Other pulmonary embolism without acute cor pulmonale: Secondary | ICD-10-CM

## 2017-10-22 LAB — POCT INR: INR: 1.8 — AB (ref 2.0–3.0)

## 2017-10-28 ENCOUNTER — Other Ambulatory Visit: Payer: Self-pay

## 2017-10-28 ENCOUNTER — Ambulatory Visit (INDEPENDENT_AMBULATORY_CARE_PROVIDER_SITE_OTHER): Payer: Medicare HMO | Admitting: Family Medicine

## 2017-10-28 ENCOUNTER — Telehealth: Payer: Self-pay

## 2017-10-28 ENCOUNTER — Encounter: Payer: Self-pay | Admitting: Family Medicine

## 2017-10-28 VITALS — BP 122/68 | HR 64 | Temp 98.5°F | Ht 59.0 in | Wt 179.0 lb

## 2017-10-28 DIAGNOSIS — Z23 Encounter for immunization: Secondary | ICD-10-CM

## 2017-10-28 DIAGNOSIS — Z7901 Long term (current) use of anticoagulants: Secondary | ICD-10-CM | POA: Diagnosis not present

## 2017-10-28 DIAGNOSIS — I10 Essential (primary) hypertension: Secondary | ICD-10-CM | POA: Diagnosis not present

## 2017-10-28 DIAGNOSIS — I2699 Other pulmonary embolism without acute cor pulmonale: Secondary | ICD-10-CM

## 2017-10-28 LAB — POCT INR: INR: 2.3 (ref 2.0–3.0)

## 2017-10-28 MED ORDER — FLUCONAZOLE 100 MG PO TABS: ORAL_TABLET | ORAL | 3 refills | Status: DC

## 2017-10-28 MED ORDER — FUROSEMIDE 20 MG PO TABS: ORAL_TABLET | ORAL | 3 refills | Status: DC

## 2017-10-28 MED ORDER — AMLODIPINE BESYLATE 5 MG PO TABS: 5.0000 mg | ORAL_TABLET | Freq: Every day | ORAL | 3 refills | Status: DC

## 2017-10-28 NOTE — Patient Instructions (Signed)
Great to see you!   

## 2017-10-28 NOTE — Telephone Encounter (Signed)
Received call from [facility] [employer] regarding rx for fluconazole. Want to verify MD aware of interaction between fluconazole and warfarin. Spoke with Dr. Nori Riis who is aware of interaction and okay to fill. [facility] informed Wallace Cullens, RN

## 2017-10-29 ENCOUNTER — Ambulatory Visit: Payer: Medicare HMO | Admitting: Family Medicine

## 2017-10-29 ENCOUNTER — Encounter: Payer: Self-pay | Admitting: Family Medicine

## 2017-10-29 LAB — BASIC METABOLIC PANEL
BUN / CREAT RATIO: 29 — AB (ref 12–28)
BUN: 15 mg/dL (ref 8–27)
CALCIUM: 9.1 mg/dL (ref 8.7–10.3)
CO2: 25 mmol/L (ref 20–29)
Chloride: 100 mmol/L (ref 96–106)
Creatinine, Ser: 0.52 mg/dL — ABNORMAL LOW (ref 0.57–1.00)
GFR calc non Af Amer: 101 mL/min/{1.73_m2} (ref >59)
GFR, EST AFRICAN AMERICAN: 116 mL/min/{1.73_m2} (ref >59)
Glucose: 96 mg/dL (ref 65–99)
POTASSIUM: 4.3 mmol/L (ref 3.5–5.2)
SODIUM: 140 mmol/L (ref 134–144)

## 2017-10-29 NOTE — Assessment & Plan Note (Signed)
Doing well on the medication.  She does have some occasional ankle swelling but does not think it is any worse than before she started the amlodipine.  We will check some lab work today.  Follow-up hypertension in 3 months.

## 2017-10-29 NOTE — Progress Notes (Signed)
    CHIEF COMPLAINT / HPI: Follow-up change in medication for hypertension.  Still has some occasional ankle swelling but it is essentially no different than prior to starting amlodipine.  No other side effects that she is aware of.  She feels well.  She brings with her a list of blood pressure readings.  REVIEW OF SYSTEMS: See HPI.  PERTINENT  PMH / PSH: I have reviewed the patient's medications, allergies, past medical and surgical history, smoking status and updated in the EMR as appropriate.   OBJECTIVE:  Vital signs reviewed. GENERAL: Well-developed, well-nourished, no acute distress. CARDIOVASCULAR: Regular rate and rhythm no murmur gallop or rub LUNGS: Clear to auscultation bilaterally, no rales or wheeze. ABDOMEN: Soft positive bowel sounds NEURO: No gross focal neurological deficits. MSK: Movement of extremity x 4. EXTREMITY: No lower extremity edema noted.   ASSESSMENT / PLAN:  Essential hypertension Doing well on the medication.  She does have some occasional ankle swelling but does not think it is any worse than before she started the amlodipine.  We will check some lab work today.  Follow-up hypertension in 3 months.

## 2017-11-11 ENCOUNTER — Ambulatory Visit: Payer: Medicare HMO

## 2017-11-12 ENCOUNTER — Other Ambulatory Visit: Payer: Self-pay | Admitting: Family Medicine

## 2017-11-13 ENCOUNTER — Ambulatory Visit (INDEPENDENT_AMBULATORY_CARE_PROVIDER_SITE_OTHER): Payer: Medicare HMO | Admitting: *Deleted

## 2017-11-13 DIAGNOSIS — Z7901 Long term (current) use of anticoagulants: Secondary | ICD-10-CM | POA: Diagnosis not present

## 2017-11-13 DIAGNOSIS — I2699 Other pulmonary embolism without acute cor pulmonale: Secondary | ICD-10-CM

## 2017-11-13 LAB — POCT INR: INR: 4.2 — AB (ref 2.0–3.0)

## 2017-11-21 ENCOUNTER — Ambulatory Visit (INDEPENDENT_AMBULATORY_CARE_PROVIDER_SITE_OTHER): Payer: Medicare HMO | Admitting: *Deleted

## 2017-11-21 DIAGNOSIS — Z7901 Long term (current) use of anticoagulants: Secondary | ICD-10-CM

## 2017-11-21 DIAGNOSIS — I2699 Other pulmonary embolism without acute cor pulmonale: Secondary | ICD-10-CM

## 2017-11-21 LAB — POCT INR: INR: 1.9 — AB (ref 2.0–3.0)

## 2017-11-26 DIAGNOSIS — H52223 Regular astigmatism, bilateral: Secondary | ICD-10-CM | POA: Diagnosis not present

## 2017-11-26 DIAGNOSIS — H2513 Age-related nuclear cataract, bilateral: Secondary | ICD-10-CM | POA: Diagnosis not present

## 2017-11-26 DIAGNOSIS — Z0100 Encounter for examination of eyes and vision without abnormal findings: Secondary | ICD-10-CM | POA: Diagnosis not present

## 2017-11-27 ENCOUNTER — Ambulatory Visit (INDEPENDENT_AMBULATORY_CARE_PROVIDER_SITE_OTHER): Payer: Medicare HMO | Admitting: *Deleted

## 2017-11-27 DIAGNOSIS — Z7901 Long term (current) use of anticoagulants: Secondary | ICD-10-CM

## 2017-11-27 DIAGNOSIS — I2699 Other pulmonary embolism without acute cor pulmonale: Secondary | ICD-10-CM

## 2017-11-27 LAB — POCT INR: INR: 1.8 — AB (ref 2.0–3.0)

## 2017-11-28 ENCOUNTER — Ambulatory Visit: Payer: Medicare HMO

## 2017-12-11 ENCOUNTER — Ambulatory Visit (INDEPENDENT_AMBULATORY_CARE_PROVIDER_SITE_OTHER): Payer: Medicare HMO | Admitting: *Deleted

## 2017-12-11 DIAGNOSIS — I2699 Other pulmonary embolism without acute cor pulmonale: Secondary | ICD-10-CM | POA: Diagnosis not present

## 2017-12-11 DIAGNOSIS — Z7901 Long term (current) use of anticoagulants: Secondary | ICD-10-CM

## 2017-12-11 LAB — POCT INR: INR: 3.9 — AB (ref 2.0–3.0)

## 2017-12-25 ENCOUNTER — Ambulatory Visit (INDEPENDENT_AMBULATORY_CARE_PROVIDER_SITE_OTHER): Payer: Medicare HMO | Admitting: *Deleted

## 2017-12-25 DIAGNOSIS — Z7901 Long term (current) use of anticoagulants: Secondary | ICD-10-CM | POA: Diagnosis not present

## 2017-12-25 DIAGNOSIS — I2699 Other pulmonary embolism without acute cor pulmonale: Secondary | ICD-10-CM

## 2017-12-25 LAB — POCT INR: INR: 2.2 (ref 2.0–3.0)

## 2018-01-02 ENCOUNTER — Encounter: Payer: Self-pay | Admitting: Family Medicine

## 2018-01-05 ENCOUNTER — Other Ambulatory Visit: Payer: Self-pay | Admitting: Family Medicine

## 2018-01-05 MED ORDER — BENAZEPRIL HCL 20 MG PO TABS: ORAL_TABLET | ORAL | 3 refills | Status: DC

## 2018-01-05 NOTE — Progress Notes (Signed)
See patient chart notes She is switching back to benazepril

## 2018-01-08 ENCOUNTER — Ambulatory Visit: Payer: Medicare HMO

## 2018-01-09 ENCOUNTER — Ambulatory Visit (INDEPENDENT_AMBULATORY_CARE_PROVIDER_SITE_OTHER): Payer: Medicare HMO | Admitting: *Deleted

## 2018-01-09 DIAGNOSIS — Z7901 Long term (current) use of anticoagulants: Secondary | ICD-10-CM

## 2018-01-09 DIAGNOSIS — I2699 Other pulmonary embolism without acute cor pulmonale: Secondary | ICD-10-CM

## 2018-01-09 LAB — POCT INR: INR: 2 (ref 2.0–3.0)

## 2018-01-23 ENCOUNTER — Ambulatory Visit (INDEPENDENT_AMBULATORY_CARE_PROVIDER_SITE_OTHER): Payer: Medicare HMO | Admitting: *Deleted

## 2018-01-23 DIAGNOSIS — Z7901 Long term (current) use of anticoagulants: Secondary | ICD-10-CM | POA: Diagnosis not present

## 2018-01-23 DIAGNOSIS — I2699 Other pulmonary embolism without acute cor pulmonale: Secondary | ICD-10-CM

## 2018-01-23 LAB — POCT INR: INR: 4.6 — AB (ref 2.0–3.0)

## 2018-01-27 ENCOUNTER — Emergency Department (HOSPITAL_COMMUNITY): Payer: Medicare HMO

## 2018-01-27 ENCOUNTER — Other Ambulatory Visit: Payer: Self-pay

## 2018-01-27 ENCOUNTER — Encounter (HOSPITAL_COMMUNITY): Payer: Self-pay | Admitting: Emergency Medicine

## 2018-01-27 ENCOUNTER — Emergency Department (HOSPITAL_COMMUNITY)
Admission: EM | Admit: 2018-01-27 | Discharge: 2018-01-27 | Disposition: A | Discharge status: Home / Self Care | Payer: Medicare HMO | Attending: Emergency Medicine | Admitting: Emergency Medicine

## 2018-01-27 ENCOUNTER — Telehealth: Payer: Self-pay

## 2018-01-27 DIAGNOSIS — R0789 Other chest pain: Secondary | ICD-10-CM

## 2018-01-27 DIAGNOSIS — R5383 Other fatigue: Secondary | ICD-10-CM | POA: Diagnosis not present

## 2018-01-27 DIAGNOSIS — Z7901 Long term (current) use of anticoagulants: Secondary | ICD-10-CM | POA: Diagnosis not present

## 2018-01-27 DIAGNOSIS — Z86718 Personal history of other venous thrombosis and embolism: Secondary | ICD-10-CM | POA: Insufficient documentation

## 2018-01-27 DIAGNOSIS — R0602 Shortness of breath: Secondary | ICD-10-CM | POA: Diagnosis not present

## 2018-01-27 DIAGNOSIS — Z79899 Other long term (current) drug therapy: Secondary | ICD-10-CM | POA: Insufficient documentation

## 2018-01-27 DIAGNOSIS — R079 Chest pain, unspecified: Secondary | ICD-10-CM | POA: Diagnosis not present

## 2018-01-27 DIAGNOSIS — I10 Essential (primary) hypertension: Secondary | ICD-10-CM | POA: Diagnosis not present

## 2018-01-27 DIAGNOSIS — R062 Wheezing: Secondary | ICD-10-CM | POA: Diagnosis not present

## 2018-01-27 LAB — CBC WITH DIFFERENTIAL/PLATELET
Abs Immature Granulocytes: 0.03 10*3/uL (ref 0.00–0.07)
Basophils Absolute: 0 10*3/uL (ref 0.0–0.1)
Basophils Relative: 0 %
Eosinophils Absolute: 0.1 10*3/uL (ref 0.0–0.5)
Eosinophils Relative: 1 %
HEMATOCRIT: 45.2 % (ref 36.0–46.0)
Hemoglobin: 14.2 g/dL (ref 12.0–15.0)
Immature Granulocytes: 0 %
Lymphocytes Relative: 32 %
Lymphs Abs: 3.1 10*3/uL (ref 0.7–4.0)
MCH: 28.2 pg (ref 26.0–34.0)
MCHC: 31.4 g/dL (ref 30.0–36.0)
MCV: 89.9 fL (ref 80.0–100.0)
Monocytes Absolute: 1.1 10*3/uL — ABNORMAL HIGH (ref 0.1–1.0)
Monocytes Relative: 12 %
Neutro Abs: 5.3 10*3/uL (ref 1.7–7.7)
Neutrophils Relative %: 55 %
Platelets: 275 10*3/uL (ref 150–400)
RBC: 5.03 MIL/uL (ref 3.87–5.11)
RDW: 12.4 % (ref 11.5–15.5)
WBC: 9.6 10*3/uL (ref 4.0–10.5)
nRBC: 0 % (ref 0.0–0.2)

## 2018-01-27 LAB — I-STAT TROPONIN, ED: Troponin i, poc: 0.01 ng/mL (ref 0.00–0.08)

## 2018-01-27 LAB — TROPONIN I: Troponin I: <0.03 ng/mL (ref <0.03)

## 2018-01-27 LAB — COMPREHENSIVE METABOLIC PANEL
ALT: 31 U/L (ref 0–44)
AST: 25 U/L (ref 15–41)
Albumin: 3 g/dL — ABNORMAL LOW (ref 3.5–5.0)
Alkaline Phosphatase: 97 U/L (ref 38–126)
Anion gap: 12 (ref 5–15)
BILIRUBIN TOTAL: 0.8 mg/dL (ref 0.3–1.2)
BUN: 13 mg/dL (ref 8–23)
CO2: 22 mmol/L (ref 22–32)
Calcium: 8.9 mg/dL (ref 8.9–10.3)
Chloride: 104 mmol/L (ref 98–111)
Creatinine, Ser: 0.62 mg/dL (ref 0.44–1.00)
GFR calc Af Amer: >60 mL/min (ref >60)
GFR calc non Af Amer: >60 mL/min (ref >60)
Glucose, Bld: 92 mg/dL (ref 70–99)
POTASSIUM: 4.5 mmol/L (ref 3.5–5.1)
Sodium: 138 mmol/L (ref 135–145)
Total Protein: 6.2 g/dL — ABNORMAL LOW (ref 6.5–8.1)

## 2018-01-27 LAB — PROTIME-INR
INR: 1.22
Prothrombin Time: 15.2 seconds (ref 11.4–15.2)

## 2018-01-27 MED ORDER — IOPAMIDOL (ISOVUE-370) INJECTION 76%
INTRAVENOUS | Status: AC
  Administered 2018-01-27: 100 mL
  Filled 2018-01-27: qty 100

## 2018-01-27 MED ORDER — METHOCARBAMOL 500 MG PO TABS: 500.0000 mg | ORAL_TABLET | Freq: Three times a day (TID) | ORAL | 0 refills | Status: AC | PRN

## 2018-01-27 MED ORDER — AMOXICILLIN 500 MG PO CAPS: 500.0000 mg | ORAL_CAPSULE | Freq: Three times a day (TID) | ORAL | 0 refills | Status: AC

## 2018-01-27 NOTE — Telephone Encounter (Signed)
Spoke with Herbie Baltimore and he offered to check patients INR.  Called patient back, she would rather just stay home and wait until she sees pcp tomorrow.   Again, advised her to go to the ED with current sxs, especially if they worsen.

## 2018-01-27 NOTE — ED Triage Notes (Signed)
Pt has been having SOB,CP, feeling sweaty today. Pt has hx of PE. Sent here for PE workup

## 2018-01-27 NOTE — Telephone Encounter (Signed)
Pt called nurse line stating she been having episodes of "really bad" chest pain and SOB. Pt stated, "it feels just like it did when I had my PE." Pt was last seen for INR check 12/6-4.6. I advised patient to go to the ER with current sxs. Pt stated, "I do not want to go there, I want a chest xray from Dr. Nori Riis." Will forward to pcp.

## 2018-01-27 NOTE — ED Provider Notes (Signed)
[facility] EMERGENCY DEPARTMENT Provider Note   CSN: [id number] Arrival date & time: 01/27/18  1504     History   Chief Complaint Chief Complaint  Patient presents with  . Shortness of Breath    HPI [name] is a 67 y.o. female.  HPI   67 year old female with past medical history as below including submassive pulmonary embolism on coumadin here with chest pain.  The patient states that over the last several days, she has had a mild, dry cough which she has attributed to a sinus infection and bronchitis.  She was scheduled to see her doctor tomorrow.  However, earlier today, around 1 PM, she developed acute onset of a sharp, pleuritic, anterior chest pain.  She had associated severe worsening of her shortness of breath as well as episodes of diaphoresis.  She felt like she could not catch her breath.  She states this feels similar to when she had her PE in the past.  She called her PCP who told her to come to the ED.  She states she still has some mild, intermittent pain, but it is largely resolved.  No further shortness of breath or diaphoresis.  No fevers.  No sputum production.  She has been taking her Coumadin and recently was supratherapeutic, but has been subtherapeutic as well. No other recent med changes. NO recent immobilization or travel. No LE swelling.   Past Medical History:  Diagnosis Date  . Anemia   . Anxiety   . Arthritis   . Depression   . Dysrhythmia    occ due to caffeine intake  . Encounter for long-term (current) use of other medications   . GERD (gastroesophageal reflux disease)   . Hepatitis   . History of hiatal hernia   . Hypertension   . IBS (irritable bowel syndrome)   . Obesity   . Osteoarthritis   . Pain in joint, multiple sites   . Pneumonia   . PONV (postoperative nausea and vomiting)    in the 1980's no problems since  . Psoriatic arthropathy ([place])   . Pulmonary embolism ([place]) 12/03/2016   submassive/notes  12/03/2016  . Raynaud disease     Patient Active Problem List   Diagnosis Date Noted  . Long term (current) use of anticoagulants 06/24/2017  . Collagenous colitis 05/07/2017  . Hx vasculitis leukocytoclastic 2018 05/07/2017  . GAD (generalized anxiety disorder)   . PE (pulmonary thromboembolism) ([place]) 12/03/2016  . S/P shoulder replacement, right 06/28/2016  . Vitamin D deficiency 12/05/2015  . Vitamin B 12 deficiency 12/05/2015  . AC (acromioclavicular) joint arthritis 08/29/2015  . Well adult health check 09/01/2013  . Hammertoe 11/20/2012  . Psoriatic arthritis ([place]) 04/10/2012  . Pulmonary nodule seen on imaging study 11/08/2011  . Dysthymia 12/12/2010  . Pain in joint, shoulder region 10/03/2008  . Essential hypertension 04/27/2008  . Palpitations 10/30/2007  . METATARSALGIA 10/02/2006  . KNEE REPLACEMENT, LEFT, HX OF 07/03/2006  . OBESITY, NOS 04/17/2006  . PANIC ATTACKS 04/17/2006  . GASTROESOPHAGEAL REFLUX, NO ESOPHAGITIS 04/17/2006  . CHOLELITHIASIS, NOS 04/17/2006  . VAGINITIS, ATROPHIC 04/17/2006  . ARTHRALGIA, UNSPECIFIED 04/17/2006    Past Surgical History:  Procedure Laterality Date  . ABDOMINAL HYSTERECTOMY    . COLONOSCOPY    . GASTRIC BYPASS    . HALLUX VALGUS CORRECTION  09/2010   Dr Arnetha Courser; right 1st ray  . HERNIA REPAIR    . JOINT REPLACEMENT     left knee times 3  .  LAPAROSCOPIC CHOLECYSTECTOMY  04/2000   Archie Endo 07/03/2010  . LAPAROSCOPIC INCISIONAL / UMBILICAL / VENTRAL HERNIA REPAIR  09/2002   VHR/notes 07/03/2010  . REVISION TOTAL KNEE ARTHROPLASTY Left 10/2003   Archie Endo 07/03/2010  . REVISION TOTAL KNEE ARTHROPLASTY Left 02/2007   Archie Endo 06/21/2010  . SHOULDER HEMI-ARTHROPLASTY Right 06/28/2016   Procedure: RIGHT SHOULDER HEMI-ARTHROPLASTY;  Surgeon: Netta Cedars, MD;  Location: [facility];  Service: Orthopedics;  Laterality: Right;  . TOTAL KNEE ARTHROPLASTY Left 10/1999   Archie Endo 07/03/2010  . TUBAL LIGATION       OB History   None       Home Medications    Prior to Admission medications   Medication Sig Start Date End Date Taking? Authorizing Provider  albuterol (PROVENTIL HFA;VENTOLIN HFA) 108 (90 Base) MCG/ACT inhaler Inhale 2 puffs into the lungs every 6 (six) hours as needed for wheezing or shortness of breath. 08/14/17   [place], Elyse Jarvis, FNP  ALPRAZolam (XANAX) 1 MG tablet TAKE 1 TABLET (1 MG TOTAL) BY MOUTH 2 (TWO) TIMES DAILY AS NEEDED FOR ANXIETY. 11/13/17   [name], MD  amoxicillin (AMOXIL) 500 MG capsule Take 1 capsule (500 mg total) by mouth 3 (three) times daily for 10 days. 01/27/18 02/06/18  Duffy Bruce, MD  benazepril (LOTENSIN) 20 MG tablet TAKE 1 TABLET(20 MG) BY MOUTH DAILY 01/05/18   [name], MD  cetirizine (ZYRTEC) 10 MG tablet Take 10 mg by mouth daily.    [provider]  Cholecalciferol (VITAMIN D3) 1000 units CAPS Take 1,000 Units by mouth daily.    [provider]  diclofenac (VOLTAREN) 75 MG EC tablet TAKE 1 TABLET BY MOUTH ONCE DAILY AS NEEDED FOR JOINT PAIN 09/18/17   [name], MD  esomeprazole (NEXIUM) 20 MG capsule TAKE 1 CAPSULE(20 MG) BY MOUTH DAILY 02/12/17   [name], MD  fluconazole (DIFLUCAN) 100 MG tablet TAKE 1 TABLET BY MOUTH ONCE DAILY FOR 7 DOSES THEN 1 TABLET BY MOUTH ONCE WEEKLY 10/28/17   [name], MD  fluticasone [facility]) 50 MCG/ACT nasal spray Place 2 sprays into both nostrils daily as needed for allergies. 05/27/16   [provider]  furosemide (LASIX) 20 MG tablet TAKE 1 OR 2 TABLETS BY MOUTH DAILY FOR LOWER EXTREMITY SWELLING as directed prn 10/28/17   [name], MD  methocarbamol (ROBAXIN) 500 MG tablet Take 1 tablet (500 mg total) by mouth every 8 (eight) hours as needed for up to 7 days for muscle spasms. 01/27/18 02/03/18  Duffy Bruce, MD  valACYclovir (VALTREX) 500 MG tablet Take 2 tablets daily for three days as directed for each episode 09/16/17   [name], MD  venlafaxine XR (EFFEXOR-XR) 150 MG 24 hr capsule TAKE  1 CAPSULE(150 MG) BY MOUTH DAILY 02/06/17   [name], MD  warfarin (COUMADIN) 2 MG tablet Take 6 mg (3 tabs) Daily 07/30/17   [name], MD    Family History Family History  Problem Relation Age of Onset  . Depression Mother   . Deep vein thrombosis Father     Social History Social History   Tobacco Use  . Smoking status: Never Smoker  . Smokeless tobacco: Never Used  Substance Use Topics  . Alcohol use: No  . Drug use: No     Allergies   Vioxx [rofecoxib]; Augmentin [amoxicillin-pot clavulanate]; Bactrim [sulfamethoxazole-trimethoprim]; Banana; Cephalosporins; Ciprofloxacin; Coconut flavor; Codeine; Keflex [cephalexin]; and Zithromax [azithromycin]   Review of Systems Review of Systems  Constitutional: Positive for fatigue. Negative for chills and fever.  HENT: Negative for congestion and rhinorrhea.   Eyes: Negative for visual disturbance.  Respiratory: Positive for cough, shortness of breath and wheezing.   Cardiovascular: Negative for chest pain and leg swelling.  Gastrointestinal: Negative for abdominal pain, diarrhea, nausea and vomiting.  Genitourinary: Negative for dysuria and flank pain.  Musculoskeletal: Negative for neck pain and neck stiffness.  Skin: Negative for rash and wound.  Allergic/Immunologic: Negative for immunocompromised state.  Neurological: Negative for syncope, weakness and headaches.  All other systems reviewed and are negative.    Physical Exam Updated Vital Signs BP 107/73   Pulse 73   Resp 16   SpO2 95%   Physical Exam  Constitutional: She is oriented to person, place, and time. She appears well-developed and well-nourished. No distress.  HENT:  Head: Normocephalic and atraumatic.  Eyes: Conjunctivae are normal.  Neck: Neck supple.  Cardiovascular: Normal rate, regular rhythm and normal heart sounds. Exam reveals no friction rub.  No murmur heard. Pulmonary/Chest: Effort normal and breath sounds normal. No respiratory  distress. She has no wheezes. She has no rales.  No chest wall TTP  Abdominal: She exhibits no distension.  Musculoskeletal: She exhibits no edema.  Neurological: She is alert and oriented to person, place, and time. She exhibits normal muscle tone.  Skin: Skin is warm. Capillary refill takes less than 2 seconds.  Psychiatric: She has a normal mood and affect.  Nursing note and vitals reviewed.    ED Treatments / Results  Labs (all labs ordered are listed, but only abnormal results are displayed) Labs Reviewed  CBC WITH DIFFERENTIAL/PLATELET - Abnormal; Notable for the following components:      Result Value   Monocytes Absolute 1.1 (*)    All other components within normal limits  COMPREHENSIVE METABOLIC PANEL - Abnormal; Notable for the following components:   Total Protein 6.2 (*)    Albumin 3.0 (*)    All other components within normal limits  PROTIME-INR  TROPONIN I  I-STAT TROPONIN, ED    EKG EKG Interpretation  Date/Time:  Tuesday January 27 2018 15:42:13 EST Ventricular Rate:  76 PR Interval:    QRS Duration: 82 QT Interval:  388 QTC Calculation: 437 R Axis:   52 Text Interpretation:  Sinus rhythm Low voltage, precordial leads No significant change since last tracing Confirmed by Duffy Bruce [phone]) on 01/27/2018 3:45:05 PM Also confirmed by Duffy Bruce [phone]), editor Philomena Doheny [phone])  on 01/27/2018 4:32:55 PM   Radiology Dg Chest 2 View  Result Date: 01/27/2018 CLINICAL DATA:  Onset of chest pain and shortness of breath today. History of previous pulmonary embolism, pneumonia, gastroesophageal reflux. EXAM: CHEST - 2 VIEW COMPARISON:  Chest x-ray and chest CT scan of December 03, 2017 FINDINGS: The lungs are adequately inflated. There is no focal infiltrate. There is no pleural effusion. The heart and pulmonary vascularity are normal. The mediastinum is normal in width. There is prominent thoracic kyphosis which is stable. IMPRESSION: There is no  pneumonia nor other acute cardiopulmonary abnormality. Electronically Signed   By: David  Martinique M.D.   On: 01/27/2018 16:05   Ct Angio Chest Pe W Or Wo Contrast  Result Date: 01/27/2018 CLINICAL DATA:  Chest pain.  History of pulmonary embolus EXAM: CT ANGIOGRAPHY CHEST WITH CONTRAST TECHNIQUE: Multidetector CT imaging of the chest was performed using the standard protocol during bolus administration of intravenous contrast. Multiplanar CT image reconstructions and MIPs were obtained to evaluate  the vascular anatomy. CONTRAST:  156mL ISOVUE-370 IOPAMIDOL (ISOVUE-370) INJECTION 76% COMPARISON:  October 03, 2016 chest CT and chest radiograph January 27, 2018 FINDINGS: Cardiovascular: There is no demonstrable pulmonary embolus. There is no thoracic aortic aneurysm or dissection. The visualized great vessels appear unremarkable. There is no appreciable pericardial effusion or pericardial thickening. There are foci of coronary artery calcification. Mediastinum/Nodes: Visualized thyroid appears normal. There is no appreciable thoracic adenopathy. No esophageal lesions are appreciable. Lungs/Pleura: There are areas of atelectatic change in the lung bases. There is no evident edema or consolidation. On axial slice 48 series 8, there is a 3 mm nodular opacity in the posterior segment of the right lower lobe. On axial slice 32 series 8, there is a 5 mm nodular opacity in the medial segment of the right middle lobe, stable. No larger pulmonary nodular lesions are evident. There is no appreciable pleural effusion or pleural thickening. Upper Abdomen: There are surgical clips in the upper left abdomen. There is apparent incomplete visualization of an extrarenal pelvis on the left. Gallbladder is absent. Visualized upper abdominal structures otherwise appear unremarkable. Musculoskeletal: There is degenerative change throughout the thoracic region. There is a total shoulder replacement on the right. There are no blastic or  lytic bone lesions. There are no evident chest wall lesions. Review of the MIP images confirms the above findings. IMPRESSION: 1. No demonstrable pulmonary embolus. No thoracic aortic aneurysm. There are foci of coronary artery calcification. 2. Nodular opacities, largest measuring 5 mm. This 5 mm nodular opacity is stable compared to the 2018 study. No lung edema or consolidation. There are areas of mild atelectasis. No follow-up needed if patient is low-risk (and has no known or suspected primary neoplasm). Non-contrast chest CT can be considered in 12 months if patient is high-risk. This recommendation follows the consensus statement: Guidelines for Management of Incidental Pulmonary Nodules Detected on CT Images: From the Fleischner Society 2017; Radiology 2017; 284:228-243. 3.  No evident thoracic adenopathy. 4.  Postoperative changes in upper abdomen. Electronically Signed   By: Lowella Grip III M.D.   On: 01/27/2018 19:27    Procedures Procedures (including critical care time)  Medications Ordered in ED Medications  iopamidol (ISOVUE-370) 76 % injection (100 mLs  Contrast Given 01/27/18 1851)     Initial Impression / Assessment and Plan / ED Course  I have reviewed the triage vital signs and the nursing notes.  Pertinent labs & imaging results that were available during my care of the patient were reviewed by me and considered in my medical decision making (see chart for details).     67 year old female here with somewhat atypical, reproducible anterior chest pain after coughing frequently for the last several days as well as lifting her 48-month-old grandson out of the crib.  I suspect this is likely musculoskeletal.  EKG is nonischemic and troponins are negative x2 with negative stress test within the last year and I do not suspect ACS currently.  Given her history of PE, CT Angie obtained and is fortunately negative for acute abnormality.  Her pulmonary nodules are unchanged.  She is  already anticoagulated.  Lab work is otherwise reassuring.  Do not suspect dissection and CT negative.  Will treat her for possible musculoskeletal pain.  She also has a concomitant sinusitis which could be contributing to coughing and her chest wall pain.  She has a history of the same and has responded well to amoxicillin, which is her only antibiotic she can take due to  allergies.  Will give her a course for this.  Will have her follow-up with her PCP.  Final Clinical Impressions(s) / ED Diagnoses   Final diagnoses:  Atypical chest pain    ED Discharge Orders         Ordered    amoxicillin (AMOXIL) 500 MG capsule  3 times daily     01/27/18 2055    methocarbamol (ROBAXIN) 500 MG tablet  Every 8 hours PRN     01/27/18 2055           Duffy Bruce, MD 01/27/18 2057

## 2018-01-28 ENCOUNTER — Ambulatory Visit: Payer: Medicare HMO

## 2018-01-30 ENCOUNTER — Ambulatory Visit (INDEPENDENT_AMBULATORY_CARE_PROVIDER_SITE_OTHER): Payer: Medicare HMO | Admitting: *Deleted

## 2018-01-30 DIAGNOSIS — Z7901 Long term (current) use of anticoagulants: Secondary | ICD-10-CM

## 2018-01-30 DIAGNOSIS — I2699 Other pulmonary embolism without acute cor pulmonale: Secondary | ICD-10-CM | POA: Diagnosis not present

## 2018-01-30 LAB — POCT INR: INR: 1.5 — AB (ref 2.0–3.0)

## 2018-02-06 ENCOUNTER — Other Ambulatory Visit: Payer: Self-pay | Admitting: Family Medicine

## 2018-02-06 ENCOUNTER — Ambulatory Visit (INDEPENDENT_AMBULATORY_CARE_PROVIDER_SITE_OTHER): Payer: Medicare HMO | Admitting: *Deleted

## 2018-02-06 DIAGNOSIS — I2699 Other pulmonary embolism without acute cor pulmonale: Secondary | ICD-10-CM

## 2018-02-06 DIAGNOSIS — Z7901 Long term (current) use of anticoagulants: Secondary | ICD-10-CM | POA: Diagnosis not present

## 2018-02-06 LAB — POCT INR: INR: 1.7 — AB (ref 2.0–3.0)

## 2018-02-13 ENCOUNTER — Telehealth: Payer: Self-pay | Admitting: Family Medicine

## 2018-02-13 ENCOUNTER — Ambulatory Visit (INDEPENDENT_AMBULATORY_CARE_PROVIDER_SITE_OTHER): Payer: Medicare HMO | Admitting: *Deleted

## 2018-02-13 DIAGNOSIS — I2699 Other pulmonary embolism without acute cor pulmonale: Secondary | ICD-10-CM

## 2018-02-13 DIAGNOSIS — Z7901 Long term (current) use of anticoagulants: Secondary | ICD-10-CM | POA: Diagnosis not present

## 2018-02-13 LAB — POCT INR: INR: 2.3 (ref 2.0–3.0)

## 2018-02-13 NOTE — Telephone Encounter (Signed)
xoxoxo

## 2018-02-21 ENCOUNTER — Other Ambulatory Visit: Payer: Self-pay | Admitting: Family Medicine

## 2018-02-27 ENCOUNTER — Ambulatory Visit (INDEPENDENT_AMBULATORY_CARE_PROVIDER_SITE_OTHER): Payer: Medicare HMO | Admitting: *Deleted

## 2018-02-27 DIAGNOSIS — Z7901 Long term (current) use of anticoagulants: Secondary | ICD-10-CM

## 2018-02-27 DIAGNOSIS — I2699 Other pulmonary embolism without acute cor pulmonale: Secondary | ICD-10-CM

## 2018-02-27 LAB — POCT INR: INR: 2 (ref 2.0–3.0)

## 2018-03-03 ENCOUNTER — Other Ambulatory Visit: Payer: Self-pay

## 2018-03-04 ENCOUNTER — Encounter: Payer: Self-pay | Admitting: Family Medicine

## 2018-03-04 MED ORDER — FUROSEMIDE 20 MG PO TABS: ORAL_TABLET | ORAL | 3 refills | Status: DC

## 2018-03-04 MED ORDER — BENAZEPRIL HCL 20 MG PO TABS: ORAL_TABLET | ORAL | 3 refills | Status: DC

## 2018-03-04 MED ORDER — VENLAFAXINE HCL ER 150 MG PO CP24: 150.0000 mg | ORAL_CAPSULE | Freq: Every day | ORAL | 2 refills | Status: DC

## 2018-03-05 ENCOUNTER — Other Ambulatory Visit: Payer: Self-pay

## 2018-03-06 MED ORDER — ALPRAZOLAM 1 MG PO TABS: ORAL_TABLET | ORAL | 5 refills | Status: DC

## 2018-03-06 MED ORDER — WARFARIN SODIUM 2 MG PO TABS: ORAL_TABLET | ORAL | 3 refills | Status: DC

## 2018-03-06 MED ORDER — DICLOFENAC SODIUM 75 MG PO TBEC: DELAYED_RELEASE_TABLET | ORAL | 3 refills | Status: DC

## 2018-03-06 MED ORDER — FLUCONAZOLE 100 MG PO TABS: ORAL_TABLET | ORAL | 5 refills | Status: DC

## 2018-03-10 ENCOUNTER — Ambulatory Visit (INDEPENDENT_AMBULATORY_CARE_PROVIDER_SITE_OTHER): Payer: Medicare HMO | Admitting: *Deleted

## 2018-03-10 DIAGNOSIS — I2699 Other pulmonary embolism without acute cor pulmonale: Secondary | ICD-10-CM | POA: Diagnosis not present

## 2018-03-10 DIAGNOSIS — Z7901 Long term (current) use of anticoagulants: Secondary | ICD-10-CM | POA: Diagnosis not present

## 2018-03-10 LAB — POCT INR: INR: 1.4 — AB (ref 2.0–3.0)

## 2018-03-11 ENCOUNTER — Other Ambulatory Visit: Payer: Self-pay

## 2018-03-11 MED ORDER — BENAZEPRIL HCL 20 MG PO TABS: ORAL_TABLET | ORAL | 3 refills | Status: DC

## 2018-03-24 ENCOUNTER — Ambulatory Visit (INDEPENDENT_AMBULATORY_CARE_PROVIDER_SITE_OTHER): Payer: Medicare HMO | Admitting: *Deleted

## 2018-03-24 DIAGNOSIS — Z7901 Long term (current) use of anticoagulants: Secondary | ICD-10-CM

## 2018-03-24 DIAGNOSIS — I2699 Other pulmonary embolism without acute cor pulmonale: Secondary | ICD-10-CM

## 2018-03-24 LAB — POCT INR: INR: 1.4 — AB (ref 2.0–3.0)

## 2018-04-01 DIAGNOSIS — K52831 Collagenous colitis: Secondary | ICD-10-CM | POA: Diagnosis not present

## 2018-04-01 DIAGNOSIS — R197 Diarrhea, unspecified: Secondary | ICD-10-CM | POA: Diagnosis not present

## 2018-04-01 DIAGNOSIS — R634 Abnormal weight loss: Secondary | ICD-10-CM | POA: Diagnosis not present

## 2018-04-07 ENCOUNTER — Ambulatory Visit (INDEPENDENT_AMBULATORY_CARE_PROVIDER_SITE_OTHER): Payer: Medicare HMO | Admitting: *Deleted

## 2018-04-07 DIAGNOSIS — I2699 Other pulmonary embolism without acute cor pulmonale: Secondary | ICD-10-CM

## 2018-04-07 DIAGNOSIS — Z7901 Long term (current) use of anticoagulants: Secondary | ICD-10-CM | POA: Diagnosis not present

## 2018-04-07 LAB — POCT INR: INR: 1.4 — AB (ref 2.0–3.0)

## 2018-04-13 DIAGNOSIS — H903 Sensorineural hearing loss, bilateral: Secondary | ICD-10-CM | POA: Diagnosis not present

## 2018-04-17 ENCOUNTER — Telehealth: Payer: Self-pay | Admitting: Internal Medicine

## 2018-04-17 NOTE — Telephone Encounter (Signed)
Former [employer] patient. Moved f/u from 4/7 to 4/9 with [facility]. Spoke with patient.

## 2018-04-20 ENCOUNTER — Other Ambulatory Visit: Payer: Self-pay | Admitting: Family Medicine

## 2018-04-20 ENCOUNTER — Telehealth: Payer: Self-pay

## 2018-04-20 DIAGNOSIS — N63 Unspecified lump in unspecified breast: Secondary | ICD-10-CM

## 2018-04-20 NOTE — Telephone Encounter (Signed)
Patient left message that she found a lump in her breast on Friday. Would like to have a diagnostic mammogram as quickly as possible but [facility] requires an order.  Call back [phone] or [phone].  Danley Danker, RN [facility] RN)

## 2018-04-20 NOTE — Telephone Encounter (Signed)
Order signed Faxed copy to [facility] I called pt and told her they should call her in next 24-48 hours LEFT

## 2018-04-21 ENCOUNTER — Ambulatory Visit (INDEPENDENT_AMBULATORY_CARE_PROVIDER_SITE_OTHER): Payer: Medicare HMO | Admitting: *Deleted

## 2018-04-21 DIAGNOSIS — I2699 Other pulmonary embolism without acute cor pulmonale: Secondary | ICD-10-CM

## 2018-04-21 DIAGNOSIS — Z7901 Long term (current) use of anticoagulants: Secondary | ICD-10-CM | POA: Diagnosis not present

## 2018-04-21 LAB — POCT INR: INR: 1.3 — AB (ref 2.0–3.0)

## 2018-04-22 ENCOUNTER — Ambulatory Visit
Admission: RE | Admit: 2018-04-22 | Discharge: 2018-04-22 | Disposition: A | Discharge status: Home / Self Care | Payer: Medicare HMO | Source: Ambulatory Visit | Attending: Family Medicine | Admitting: Family Medicine

## 2018-04-22 DIAGNOSIS — N63 Unspecified lump in unspecified breast: Secondary | ICD-10-CM

## 2018-04-22 DIAGNOSIS — R928 Other abnormal and inconclusive findings on diagnostic imaging of breast: Secondary | ICD-10-CM | POA: Diagnosis not present

## 2018-04-22 DIAGNOSIS — N6489 Other specified disorders of breast: Secondary | ICD-10-CM | POA: Diagnosis not present

## 2018-05-01 DIAGNOSIS — T23212A Burn of second degree of left thumb (nail), initial encounter: Secondary | ICD-10-CM | POA: Diagnosis not present

## 2018-05-04 DIAGNOSIS — T23212D Burn of second degree of left thumb (nail), subsequent encounter: Secondary | ICD-10-CM | POA: Diagnosis not present

## 2018-05-05 ENCOUNTER — Ambulatory Visit (INDEPENDENT_AMBULATORY_CARE_PROVIDER_SITE_OTHER): Payer: Medicare HMO | Admitting: *Deleted

## 2018-05-05 ENCOUNTER — Other Ambulatory Visit: Payer: Self-pay

## 2018-05-05 DIAGNOSIS — Z7901 Long term (current) use of anticoagulants: Secondary | ICD-10-CM

## 2018-05-05 DIAGNOSIS — I2699 Other pulmonary embolism without acute cor pulmonale: Secondary | ICD-10-CM

## 2018-05-05 LAB — POCT INR: INR: 2 (ref 2.0–3.0)

## 2018-05-19 ENCOUNTER — Other Ambulatory Visit: Payer: Self-pay

## 2018-05-19 ENCOUNTER — Ambulatory Visit (INDEPENDENT_AMBULATORY_CARE_PROVIDER_SITE_OTHER): Payer: Medicare HMO | Admitting: *Deleted

## 2018-05-19 DIAGNOSIS — I2699 Other pulmonary embolism without acute cor pulmonale: Secondary | ICD-10-CM

## 2018-05-19 DIAGNOSIS — Z7901 Long term (current) use of anticoagulants: Secondary | ICD-10-CM

## 2018-05-19 LAB — POCT INR: INR: 2.1 (ref 2.0–3.0)

## 2018-05-22 ENCOUNTER — Telehealth: Payer: Self-pay | Admitting: *Deleted

## 2018-05-22 NOTE — Telephone Encounter (Signed)
TCT patient regarding appt on 4/9. Pt is being followed by her PCP for her PE and anticoagulation. She prefers to continue her care that way and wishes to cancel her  appt with Dr. Walden Field on 05/28/18.

## 2018-05-26 ENCOUNTER — Ambulatory Visit: Payer: Medicare HMO | Admitting: Hematology and Oncology

## 2018-05-28 ENCOUNTER — Ambulatory Visit: Payer: Medicare HMO | Admitting: Internal Medicine

## 2018-06-09 ENCOUNTER — Ambulatory Visit (INDEPENDENT_AMBULATORY_CARE_PROVIDER_SITE_OTHER): Payer: Medicare HMO | Admitting: *Deleted

## 2018-06-09 ENCOUNTER — Other Ambulatory Visit: Payer: Self-pay

## 2018-06-09 DIAGNOSIS — Z7901 Long term (current) use of anticoagulants: Secondary | ICD-10-CM

## 2018-06-09 DIAGNOSIS — I2699 Other pulmonary embolism without acute cor pulmonale: Secondary | ICD-10-CM | POA: Diagnosis not present

## 2018-06-09 LAB — POCT INR: INR: 2.4 (ref 2.0–3.0)

## 2018-07-07 ENCOUNTER — Ambulatory Visit: Payer: Medicare HMO

## 2018-07-08 ENCOUNTER — Ambulatory Visit (INDEPENDENT_AMBULATORY_CARE_PROVIDER_SITE_OTHER): Payer: Medicare HMO | Admitting: *Deleted

## 2018-07-08 ENCOUNTER — Other Ambulatory Visit: Payer: Self-pay

## 2018-07-08 DIAGNOSIS — I2699 Other pulmonary embolism without acute cor pulmonale: Secondary | ICD-10-CM | POA: Diagnosis not present

## 2018-07-08 DIAGNOSIS — Z7901 Long term (current) use of anticoagulants: Secondary | ICD-10-CM | POA: Diagnosis not present

## 2018-07-08 LAB — POCT INR: INR: 2.1 (ref 2.0–3.0)

## 2018-08-05 ENCOUNTER — Other Ambulatory Visit: Payer: Self-pay

## 2018-08-05 ENCOUNTER — Ambulatory Visit (INDEPENDENT_AMBULATORY_CARE_PROVIDER_SITE_OTHER): Payer: Medicare HMO | Admitting: Family Medicine

## 2018-08-05 ENCOUNTER — Ambulatory Visit (INDEPENDENT_AMBULATORY_CARE_PROVIDER_SITE_OTHER): Payer: Medicare HMO | Admitting: *Deleted

## 2018-08-05 ENCOUNTER — Encounter: Payer: Self-pay | Admitting: Family Medicine

## 2018-08-05 VITALS — BP 110/70 | HR 71

## 2018-08-05 DIAGNOSIS — Z7901 Long term (current) use of anticoagulants: Secondary | ICD-10-CM | POA: Diagnosis not present

## 2018-08-05 DIAGNOSIS — W19XXXA Unspecified fall, initial encounter: Secondary | ICD-10-CM | POA: Diagnosis not present

## 2018-08-05 DIAGNOSIS — M79672 Pain in left foot: Secondary | ICD-10-CM | POA: Diagnosis not present

## 2018-08-05 DIAGNOSIS — I2699 Other pulmonary embolism without acute cor pulmonale: Secondary | ICD-10-CM

## 2018-08-05 LAB — POCT INR: INR: 3 (ref 2.0–3.0)

## 2018-08-05 NOTE — Progress Notes (Signed)
   CC: fall  HPI  Fall - fell 1 week ago. Was standing in the yard and trying to lift a bag of canned groceries onto the porch and she fell from standing. Thinks she hit her shin on some landscape bricks, hit her R shoulder on a yard decoration and has healing bruise there.   Her ankle was fine at first, but now the swelling the last 3 days and bruising. Has some pain in her shin, but can walk around and care for her grandson as normal. She was worried bc she is on warfarin. She is also worried about cellulitis as she has had this in the past on other sites. No fever.   ROS: Denies CP, SOB, abdominal pain, dysuria, changes in BMs.   CC, SH/smoking status, and VS noted  Objective: BP 110/70   Pulse 71   SpO2 99%  Gen: NAD, alert, cooperative, and pleasant. HEENT: NCAT, EOMI, PERRL CV: RRR, no murmur Resp: CTAB, no wheezes, non-labored Ext: L shin with 3 skin tears with <1cm surrounding hyperpigmentation, scabs in place over. TTP over mid L lateral shin with overlying bruising. Bruising to medial and lateral aspects of posterior ankle, but no tenderness to palpation medial or lateral posterior malleoli or navicular or base of the fifth.  Negative Homans sign, no calf tenderness. Neuro: Alert and oriented, Speech clear, No gross deficits  Assessment and plan:  Fall: Negative [place] ankle rules.  Patient is really not having any significant pain and knows to use Tylenol if needed.  She was mostly worried about whether she would need imaging or not.  I explained that I do not see any need for this based on exam.  She likely has a bone bruise of the tibia.  Explained that this may take weeks to feel back to normal and scabs should heal on their own.  Call us if any change.  Offered an Ace wrap or postop shoe for pain, but she really does not have any pain and does not feel she needs this.  Ralene Ok, MD, PGY3 08/05/2018 9:20 AM

## 2018-08-05 NOTE — Patient Instructions (Signed)
Patient declined AVS 

## 2018-08-19 ENCOUNTER — Ambulatory Visit (INDEPENDENT_AMBULATORY_CARE_PROVIDER_SITE_OTHER): Payer: Medicare HMO | Admitting: Family Medicine

## 2018-08-19 ENCOUNTER — Other Ambulatory Visit: Payer: Self-pay

## 2018-08-19 ENCOUNTER — Encounter: Payer: Self-pay | Admitting: Family Medicine

## 2018-08-19 VITALS — BP 112/64 | HR 67 | Wt 173.0 lb

## 2018-08-19 DIAGNOSIS — R269 Unspecified abnormalities of gait and mobility: Secondary | ICD-10-CM

## 2018-08-19 DIAGNOSIS — Z7901 Long term (current) use of anticoagulants: Secondary | ICD-10-CM

## 2018-08-19 DIAGNOSIS — M791 Myalgia, unspecified site: Secondary | ICD-10-CM | POA: Diagnosis not present

## 2018-08-19 DIAGNOSIS — I10 Essential (primary) hypertension: Secondary | ICD-10-CM | POA: Diagnosis not present

## 2018-08-19 DIAGNOSIS — L4050 Arthropathic psoriasis, unspecified: Secondary | ICD-10-CM | POA: Diagnosis not present

## 2018-08-19 DIAGNOSIS — K52831 Collagenous colitis: Secondary | ICD-10-CM | POA: Diagnosis not present

## 2018-08-19 DIAGNOSIS — R5383 Other fatigue: Secondary | ICD-10-CM | POA: Diagnosis not present

## 2018-08-19 DIAGNOSIS — F411 Generalized anxiety disorder: Secondary | ICD-10-CM

## 2018-08-19 DIAGNOSIS — F341 Dysthymic disorder: Secondary | ICD-10-CM

## 2018-08-19 LAB — POCT SEDIMENTATION RATE: POCT SED RATE: 21 mm/hr (ref 0–22)

## 2018-08-19 MED ORDER — HYDROCODONE-ACETAMINOPHEN 5-325 MG PO TABS: ORAL_TABLET | ORAL | 0 refills | Status: DC

## 2018-08-19 MED ORDER — DICLOFENAC SODIUM 75 MG PO TBEC: DELAYED_RELEASE_TABLET | ORAL | 3 refills | Status: DC

## 2018-08-19 MED ORDER — VENLAFAXINE HCL 75 MG PO TABS: ORAL_TABLET | ORAL | 3 refills | Status: DC

## 2018-08-19 MED ORDER — MUPIROCIN 2 % EX OINT: 1.0000 "application " | TOPICAL_OINTMENT | Freq: Two times a day (BID) | CUTANEOUS | 3 refills | Status: DC

## 2018-08-19 MED ORDER — VALACYCLOVIR HCL 500 MG PO TABS: ORAL_TABLET | ORAL | 5 refills | Status: DC

## 2018-08-19 NOTE — Patient Instructions (Signed)
See me at [facility] for orthotics RTC in 4-6 weeks for follow up here I will send you a note about your labs Call me if anytrhing worsens

## 2018-08-20 LAB — CBC WITH DIFFERENTIAL/PLATELET
Basophils Absolute: 0 10*3/uL (ref 0.0–0.2)
Basos: 0 %
EOS (ABSOLUTE): 0.2 10*3/uL (ref 0.0–0.4)
Eos: 2 %
Hematocrit: 39 % (ref 34.0–46.6)
Hemoglobin: 13 g/dL (ref 11.1–15.9)
Immature Grans (Abs): 0 10*3/uL (ref 0.0–0.1)
Immature Granulocytes: 0 %
Lymphocytes Absolute: 2.9 10*3/uL (ref 0.7–3.1)
Lymphs: 43 %
MCH: 29.6 pg (ref 26.6–33.0)
MCHC: 33.3 g/dL (ref 31.5–35.7)
MCV: 89 fL (ref 79–97)
Monocytes Absolute: 0.7 10*3/uL (ref 0.1–0.9)
Monocytes: 10 %
Neutrophils Absolute: 3 10*3/uL (ref 1.4–7.0)
Neutrophils: 45 %
Platelets: 275 10*3/uL (ref 150–450)
RBC: 4.39 x10E6/uL (ref 3.77–5.28)
RDW: 12.4 % (ref 11.7–15.4)
WBC: 6.8 10*3/uL (ref 3.4–10.8)

## 2018-08-20 LAB — CMP14+EGFR
ALT: 27 IU/L (ref 0–32)
AST: 25 IU/L (ref 0–40)
Albumin/Globulin Ratio: 1.7 (ref 1.2–2.2)
Albumin: 3.8 g/dL (ref 3.8–4.8)
Alkaline Phosphatase: 97 IU/L (ref 39–117)
BUN/Creatinine Ratio: 27 (ref 12–28)
BUN: 14 mg/dL (ref 8–27)
Bilirubin Total: <0.2 mg/dL (ref 0.0–1.2)
CO2: 26 mmol/L (ref 20–29)
Calcium: 9.3 mg/dL (ref 8.7–10.3)
Chloride: 100 mmol/L (ref 96–106)
Creatinine, Ser: 0.52 mg/dL — ABNORMAL LOW (ref 0.57–1.00)
GFR calc Af Amer: 115 mL/min/{1.73_m2} (ref >59)
GFR calc non Af Amer: 100 mL/min/{1.73_m2} (ref >59)
Globulin, Total: 2.3 g/dL (ref 1.5–4.5)
Glucose: 90 mg/dL (ref 65–99)
Potassium: 5.2 mmol/L (ref 3.5–5.2)
Sodium: 136 mmol/L (ref 134–144)
Total Protein: 6.1 g/dL (ref 6.0–8.5)

## 2018-08-20 LAB — TSH: TSH: 1.92 u[IU]/mL (ref 0.450–4.500)

## 2018-08-21 DIAGNOSIS — R269 Unspecified abnormalities of gait and mobility: Secondary | ICD-10-CM | POA: Insufficient documentation

## 2018-08-21 NOTE — Assessment & Plan Note (Signed)
Wonder if this is contributing to her fatigue. Check SSED rate.

## 2018-08-21 NOTE — Assessment & Plan Note (Signed)
Increase venlfaxine to higher dose F/u 4 weeks Check some labs to rule out other reasons for fatigue

## 2018-08-21 NOTE — Assessment & Plan Note (Signed)
PT referral.

## 2018-08-21 NOTE — Progress Notes (Signed)
    CHIEF COMPLAINT / HPI:  1. Depressive sx a little worse. Not as interested in her usual activities. Has to pushherself to get showered ad]nd dressed daily. No SI/HI. Thinks COVID stress is related to worsening of sx. Wants to go back to previous dose of antidepressant.  2. Foot pain: metatarsal area. Long standing but worse last 2 months.   3. F/u BP: no problems.  4. More outbreaks of cold sores. Botherng her. 5. Energy level is down. Not sure if it is worse due to depressive sx or other. 6. Has had a couple of falls. Does not feel steady on her feet especially if she is carrying something like groceries. Once down, she nees a lot of assistance to get up.  REVIEW OF SYSTEMS: See HPI  PERTINENT  PMH / PSH: I have reviewed the patient's medications, allergies, past medical and surgical history, smoking status and updated in the EMR as appropriate.   OBJECTIVE:  Vital signs reviewed. GENERAL: Well-developed, well-nourished, no acute distress. CARDIOVASCULAR: Regular rate and rhythm no murmur gallop or rub LUNGS: Clear to auscultation bilaterally, no rales or wheeze. ABDOMEN: Soft positive bowel sounds NEURO: No gross focal neurological deficits. MSK: Movement of extremity x 4. Nomral strength B LE. Gait is a bit wide p\based NEURO slight decrease in sensation B feet--symmetrical. VASC DP pulses 2+B= PSYCH: AxOx4. Good eye contact.. No psychomotor retardation or agitation. Appropriate speech fluency and content. Asks and answers questions appropriately. Mood is congruent.     ASSESSMENT / PLAN:  No problem-specific Assessment & Plan notes found for this encounter.

## 2018-08-24 ENCOUNTER — Telehealth: Payer: Self-pay | Admitting: *Deleted

## 2018-08-24 MED ORDER — HYDROCODONE-ACETAMINOPHEN 5-325 MG PO TABS: ORAL_TABLET | ORAL | 0 refills | Status: DC

## 2018-08-24 NOTE — Telephone Encounter (Signed)
Ulice Dash from [facility] is calling.  Medicare requires first fill of narcotics to only be a 7 days supply.   After that they will cover 30 day supplies.  They need a new script sent for #14 so insurance will pay.  [facility] direct # is [phone] if needed.  Ref# [id number]  To PCP. Christen Bame, CMA

## 2018-08-25 ENCOUNTER — Encounter: Payer: Self-pay | Admitting: Family Medicine

## 2018-08-28 ENCOUNTER — Other Ambulatory Visit: Payer: Self-pay

## 2018-08-28 ENCOUNTER — Ambulatory Visit (INDEPENDENT_AMBULATORY_CARE_PROVIDER_SITE_OTHER): Payer: Medicare HMO | Admitting: Family Medicine

## 2018-08-28 VITALS — BP 100/60 | Ht 59.0 in | Wt 172.0 lb

## 2018-08-28 DIAGNOSIS — M7741 Metatarsalgia, right foot: Secondary | ICD-10-CM | POA: Diagnosis not present

## 2018-08-28 DIAGNOSIS — R269 Unspecified abnormalities of gait and mobility: Secondary | ICD-10-CM | POA: Diagnosis not present

## 2018-08-28 DIAGNOSIS — M7742 Metatarsalgia, left foot: Secondary | ICD-10-CM | POA: Diagnosis not present

## 2018-08-30 DIAGNOSIS — M7741 Metatarsalgia, right foot: Secondary | ICD-10-CM | POA: Insufficient documentation

## 2018-08-30 NOTE — Progress Notes (Signed)
Patient comes in for recommended custom molded orthotics. Long-standing history of foot issues and has had orthotics in past with improvement.  Recently has had plantar and dorsal foot pain. Left worse han right. Plantar area of pain is in forefoot (metatarsalgia type) Dorsal is in midfoot flexor tens\don area.  Standing or walking is uncomfortable. Also notes she has had several falls recent and is scheduled for gait evaluation and therapy.  OBJ: Gait is slightly unsteady, moderately, wide based. Circumlocution  Of foot B R>L on swing phase Lateral heel striker. FEET: loss transverse arch B. Long 2nd toe. TTP metatrsal head are on both. Pain interdigital left between 2 and 3.  Patient was fitted for a : standard, cushioned, semi-rigid orthotic. The orthotic was heated, placed on the orthotic stand. The patient was positioned in subtalar neutral position and 10 degrees of ankle dorsiflexion in a weight bearing stance on the heated orthotic blank After completion of molding, a stable base was applied to the orthotic blank. The blank was ground to a stable position for weight bearing. Blank: size 6 red EVA Base:white F2 Posting:extra small MT pads B. Also given a pair to place in her sandals as orthotics will not fit there.

## 2018-09-02 ENCOUNTER — Encounter: Payer: Self-pay | Admitting: Physical Therapy

## 2018-09-02 ENCOUNTER — Other Ambulatory Visit: Payer: Self-pay

## 2018-09-02 ENCOUNTER — Ambulatory Visit: Payer: Medicare HMO | Attending: Family Medicine | Admitting: Physical Therapy

## 2018-09-02 ENCOUNTER — Ambulatory Visit (INDEPENDENT_AMBULATORY_CARE_PROVIDER_SITE_OTHER): Payer: Medicare HMO | Admitting: *Deleted

## 2018-09-02 DIAGNOSIS — I2699 Other pulmonary embolism without acute cor pulmonale: Secondary | ICD-10-CM

## 2018-09-02 DIAGNOSIS — G8929 Other chronic pain: Secondary | ICD-10-CM | POA: Diagnosis not present

## 2018-09-02 DIAGNOSIS — R2689 Other abnormalities of gait and mobility: Secondary | ICD-10-CM | POA: Diagnosis not present

## 2018-09-02 DIAGNOSIS — M25551 Pain in right hip: Secondary | ICD-10-CM | POA: Diagnosis not present

## 2018-09-02 DIAGNOSIS — M25561 Pain in right knee: Secondary | ICD-10-CM | POA: Diagnosis not present

## 2018-09-02 DIAGNOSIS — Z7901 Long term (current) use of anticoagulants: Secondary | ICD-10-CM

## 2018-09-02 LAB — POCT INR: INR: 2 (ref 2.0–3.0)

## 2018-09-02 NOTE — Therapy (Signed)
[place], [place], [address] Phone: [phone]   Fax:  [phone]  Physical Therapy Evaluation  Patient Details  Name: [name] MRN: [id number] Date of Birth: 1950/08/21 Referring Provider (PT): Dr Mallie Mussel    Encounter Date: 09/02/2018  PT End of Session - 09/02/18 1206    Visit Number  1    Number of Visits  6    Date for PT Re-Evaluation  10/14/18    Authorization Type  [employer] medicare    PT Start Time  0915    PT Stop Time  0958    PT Time Calculation (min)  43 min    Activity Tolerance  Patient tolerated treatment well    Behavior During Therapy  [facility] for tasks assessed/performed       Past Medical History:  Diagnosis Date  . Anemia   . Anxiety   . Arthritis   . Depression   . Dysrhythmia    occ due to caffeine intake  . Encounter for long-term (current) use of other medications   . GERD (gastroesophageal reflux disease)   . Hepatitis   . History of hiatal hernia   . Hypertension   . IBS (irritable bowel syndrome)   . Obesity   . Osteoarthritis   . Pain in joint, multiple sites   . Pneumonia   . PONV (postoperative nausea and vomiting)    in the 1980's no problems since  . Psoriatic arthropathy ([place])   . Pulmonary embolism ([place]) 12/03/2016   submassive/notes 12/03/2016  . Raynaud disease     Past Surgical History:  Procedure Laterality Date  . ABDOMINAL HYSTERECTOMY    . COLONOSCOPY    . GASTRIC BYPASS    . HALLUX VALGUS CORRECTION  09/2010   Dr Arnetha Courser; right 1st ray  . HERNIA REPAIR    . JOINT REPLACEMENT     left knee times 3  . LAPAROSCOPIC CHOLECYSTECTOMY  04/2000   Archie Endo 07/03/2010  . LAPAROSCOPIC INCISIONAL / UMBILICAL / VENTRAL HERNIA REPAIR  09/2002   [facility]/notes 07/03/2010  . REVISION TOTAL KNEE ARTHROPLASTY Left 10/2003   Archie Endo 07/03/2010  . REVISION TOTAL KNEE ARTHROPLASTY Left 02/2007   Archie Endo 06/21/2010  . SHOULDER HEMI-ARTHROPLASTY Right 06/28/2016    Procedure: RIGHT SHOULDER HEMI-ARTHROPLASTY;  Surgeon: Netta Cedars, MD;  Location: [facility];  Service: Orthopedics;  Laterality: Right;  . TOTAL KNEE ARTHROPLASTY Left 10/1999   Archie Endo 07/03/2010  . TUBAL LIGATION      There were no vitals filed for this visit.   Subjective Assessment - 09/02/18 0922    Subjective  Patient had a fall 5 weeks ago. She was talking to a neighbor and tripped and fell. She was unable to get up off the grund. Two neighbors had to help her up. She has brusing of the left shin area. She needs a total knee replacement on the right side.She had orthotics made last week.    Pertinent History  right knee replacement, left knee OA, right shoulder replacement;    How long can you sit comfortably?  No limit but feet do hurt at times    How long can you stand comfortably?  No limit    How long can you walk comfortably?  can walk around stores but becomes fatigued    Currently in Pain?  Yes    Pain Score  8     Pain Location  Knee    Pain Descriptors / Indicators  Aching  Pain Type  Chronic pain    Pain Onset  More than a month ago    Pain Frequency  Constant    Aggravating Factors   standing and walking    Pain Relieving Factors  difficulty perfromeing ADL's    Effect of Pain on Daily Activities  pain with ambualtion         [facility] PT Assessment - 09/02/18 0001      Assessment   Medical Diagnosis  Gait abnormality     Referring Provider (PT)  Dr Mallie Mussel     Onset Date/Surgical Date  --   had a fall 5 weeks ago    Hand Dominance  Right    Next MD Visit  nothiong scheduled at this time     Prior Therapy  None      Precautions   Precautions  Fall      Restrictions   Weight Bearing Restrictions  No      Balance Screen   Has the patient fallen in the past 6 months  Yes    How many times?  2   feels like she cathes her feet on things at times    Has the patient had a decrease in activity level because of a fear of falling?   No    Is the patient  reluctant to leave their home because of a fear of falling?   No      Home Environment   Living Environment  Private residence    Type of [place] to enter    Entrance Stairs-Number of Steps  3      Prior Function   Level of Independence  Independent    Vocation  Retired    Leisure  likes to walk       Cognition   Overall Cognitive Status  Within Functional Limits for tasks assessed    Attention  Focused    Focused Attention  Appears intact    Memory  Appears intact    Awareness  Appears intact    Problem Solving  Appears intact      Observation/Other Assessments   Observations  wearing sandals. Patient dosent like to wear shoes.     Focus on Therapeutic Outcomes (FOTO)   48% limitation       Sensation   Light Touch  Appears Intact    Additional Comments  denies parathesias       Coordination   Gross Motor Movements are Fluid and Coordinated  Yes    Fine Motor Movements are Fluid and Coordinated  Yes      Posture/Postural Control   Posture/Postural Control  Postural limitations    Postural Limitations  Rounded Shoulders;Forward head      ROM / Strength   AROM / PROM / Strength  AROM;PROM;Strength      AROM   Overall AROM Comments  pain with end range right knee flexion       PROM   Overall PROM Comments  pain with end range right hip and knee movement . Tightness in end range right ankle DF       Strength   Strength Assessment Site  Hip;Knee;Ankle    Right/Left Hip  Right;Left    Right Hip Flexion  3+/5    Right Hip ABduction  4/5    Right Hip ADduction  4+/5    Left Hip Flexion  4+/5    Left Hip ABduction  4+/5    Left Hip ADduction  4+/5    Right/Left Knee  Right;Left    Right Knee Flexion  4/5    Right Knee Extension  4/5    Left Knee Flexion  5/5    Left Knee Extension  5/5      Palpation   Palpation comment  no unsexpected tnenderness to palpation       Ambulation/Gait   Gait Comments  right foot pronation; decreased  right single leg stance; significant trendelenburgh gait; gait training with cane. Mod cuing for proper use of the cane. Significant improvement in trendelenburgh gait       High Level Balance   High Level Balance Comments  tandem stance min a ; narrow base min a                 Objective measurements completed on examination: See above findings.      [facility] Adult PT Treatment/Exercise - 09/02/18 0001      Exercises   Exercises  Lumbar;Knee/Hip      Knee/Hip Exercises: Stretches   Other Knee/Hip Stretches  ankle DF with strap 3x20 sec hold       Knee/Hip Exercises: Seated   Other Seated Knee/Hip Exercises  seated hip abduction x10 red       Knee/Hip Exercises: Supine   Quad Sets Limitations  x10 with glut set 5 sec hold     Other Supine Knee/Hip Exercises  supine lateral trunk rotation x10; supine march x10;              PT Education - 09/02/18 1205    Education Details  HEP, gait training, LE strengthening    Person(s) Educated  Patient    Methods  Explanation;Demonstration;Tactile cues;Verbal cues    Comprehension  Verbalized understanding;Returned demonstration;Verbal cues required;Tactile cues required       PT Short Term Goals - 09/02/18 1231      PT SHORT TERM GOAL #1   Title  Patient will increase grosss right LE strength to 4+/5    Time  3    Period  Weeks    Status  New    Target Date  09/23/18      PT SHORT TERM GOAL #2   Title  Patient will demonstrate improved gait technique with a cane without cuing    Time  3    Period  Weeks    Status  New    Target Date  09/23/18      PT SHORT TERM GOAL #3   Title  Patient will be independent with initial HEP    Time  3    Period  Weeks    Status  New    Target Date  09/23/18        PT Long Term Goals - 09/02/18 1236      PT LONG TERM GOAL #1   Title  Patient will ambualte 2000' with improved gait technique in order to improve endurance in the community    Time  6    Period  Weeks     Status  New      PT LONG TERM GOAL #2   Title  Patient will be independent with high level balance activity    Time  6    Period  Weeks    Status  New    Target Date  10/14/18      PT LONG TERM GOAL #3   Title  Patient will demonstrate 37%  limitation on FOTO    Time  6    Period  Weeks    Status  New    Target Date  10/14/18             Plan - 09/02/18 1208    Clinical Impression Statement  Patient is a 68 year old female with right knee OA and a right sideded Trendelenburgh gait. She has weakness in her right hip and knee. She has baseline foot pain. She recently put orthotics in her shoes and reports they are helping her foot pain when she walks. Therapy reviewed use of a cane with her. She hada significant improvement in gait technique with a cane. She does not want to use a cane but therapy advised her it may reduce her hip and knee pain and allow [place] to strengthen. She reports she may try. She was given some basic strengthening exercies. She will be given a complete exercise program over the next few visits. She has a high co-pay so her visits will be limited. She was seen for a moderate compelxity eval.    Personal Factors and Comorbidities  Comorbidity 1;Comorbidity 2;Comorbidity 3+    Comorbidities  right shoulder replacement with dysfunctional outcome per patient, left knee replacement, bilateral flat foot    Examination-Activity Limitations  Lift;Squat;Locomotion Level;Stand    Examination-Participation Restrictions  Yard Work;Community Activity;Cleaning    Stability/Clinical Decision Making  Evolving/Moderate complexity    Clinical Decision Making  Moderate    Rehab Potential  Good    PT Frequency  1x / week    PT Duration  6 weeks    PT Treatment/Interventions  ADLs/Self Care Home Management;Cryotherapy;Electrical Stimulation;Iontophoresis 4mg /ml Dexamethasone;Ultrasound;Moist Heat;Contrast Bath;Gait training;Stair training;Functional mobility training;Therapeutic  activities;Therapeutic exercise;Patient/family education;Dry needling;Passive range of motion;Taping;Neuromuscular re-education;Manual techniques    PT Next Visit Plan  add standing stability exercises. Review HEP, hasmtring stretching, gait training, add standing marching and weight shiting forward; consider balance exercises as well    PT Home Exercise Plan  quad set, glut set, supine march, hip abduction with band, gastroc stretch    Consulted and Agree with Plan of Care  Patient       Patient will benefit from skilled therapeutic intervention in order to improve the following deficits and impairments:  Abnormal gait, Decreased mobility, Decreased endurance, Difficulty walking, Decreased activity tolerance, Decreased safety awareness, Decreased strength, Pain, Decreased range of motion, Increased muscle spasms, Improper body mechanics  Visit Diagnosis: 1. Other abnormalities of gait and mobility   2. Chronic pain of right knee   3. Pain in right hip        Problem List Patient Active Problem List   Diagnosis Date Noted  . Metatarsalgia of both feet 08/30/2018  . Gait abnormality 08/21/2018  . Long term (current) use of anticoagulants 06/24/2017  . Collagenous colitis 05/07/2017  . Hx vasculitis leukocytoclastic 2018 05/07/2017  . GAD (generalized anxiety disorder)   . PE (pulmonary thromboembolism) ([place]) 12/03/2016  . S/P shoulder replacement, right 06/28/2016  . Vitamin D deficiency 12/05/2015  . Vitamin B 12 deficiency 12/05/2015  . AC (acromioclavicular) joint arthritis 08/29/2015  . Well adult health check 09/01/2013  . Hammertoe 11/20/2012  . Psoriatic arthritis ([place]) 04/10/2012  . Pulmonary nodule seen on imaging study 11/08/2011  . Dysthymia 12/12/2010  . Pain in joint, shoulder region 10/03/2008  . Essential hypertension 04/27/2008  . Palpitations 10/30/2007  . METATARSALGIA 10/02/2006  . KNEE REPLACEMENT, LEFT, HX OF 07/03/2006  . OBESITY, NOS 04/17/2006  .  PANIC ATTACKS 04/17/2006  . GASTROESOPHAGEAL REFLUX, NO ESOPHAGITIS 04/17/2006  . CHOLELITHIASIS, NOS 04/17/2006  . VAGINITIS, ATROPHIC 04/17/2006  . ARTHRALGIA, UNSPECIFIED 04/17/2006    Carney Living 09/02/2018, 12:58 PM  [facility] [address] [place], [place], [address] Phone: [phone]   Fax:  [phone]  Name: [name] MRN: [id number] Date of Birth: [birth year]

## 2018-09-03 ENCOUNTER — Telehealth: Payer: Self-pay | Admitting: Physical Therapy

## 2018-09-03 NOTE — Telephone Encounter (Signed)
Spoke with patient regarding knee pain. The pain started this morning after she woke up. She had no pain after the session yesterday. She reports the pain is so bad she had a hard time walking. She was advised to hold on her exercises for right now. Only 1 of her exercises is for her knee which was a quad set. She was given 2 hip exercises and a calf stretch. She was advised to rest the next few days and ice her knee. If it remains at a high level to contact her MD. If the pain improves she was advised to pick 1-2 of the exercises then begin again slowly. She was advised to feel free to contact me at the office at any time.

## 2018-09-16 ENCOUNTER — Ambulatory Visit: Payer: Medicare HMO | Admitting: Physical Therapy

## 2018-09-23 ENCOUNTER — Other Ambulatory Visit: Payer: Self-pay

## 2018-09-23 ENCOUNTER — Ambulatory Visit (INDEPENDENT_AMBULATORY_CARE_PROVIDER_SITE_OTHER): Payer: Medicare HMO | Admitting: Family Medicine

## 2018-09-23 ENCOUNTER — Encounter: Payer: Self-pay | Admitting: Family Medicine

## 2018-09-23 VITALS — BP 118/64 | HR 74 | Wt 175.6 lb

## 2018-09-23 DIAGNOSIS — M7750 Other enthesopathy of unspecified foot: Secondary | ICD-10-CM

## 2018-09-23 DIAGNOSIS — I2699 Other pulmonary embolism without acute cor pulmonale: Secondary | ICD-10-CM | POA: Diagnosis not present

## 2018-09-23 DIAGNOSIS — M25561 Pain in right knee: Secondary | ICD-10-CM

## 2018-09-23 DIAGNOSIS — F341 Dysthymic disorder: Secondary | ICD-10-CM

## 2018-09-23 LAB — POCT INR: INR: 1.4 — AB (ref 2.0–3.0)

## 2018-09-23 NOTE — Progress Notes (Signed)
    Chief complaint: Follow-up increase in antidepressant medication.  Feels like she is improved by about 25 to 30%.  Energy is a little better.  She is taking more personal care for self. 2.  Follow-up right knee pain: Still hurting quite a bit.  The new orthotics did not seem to help the knee pain is much.She knows she needs to get total knee replacement at some point.  Right now she is trying to do some physical therapy for that knee and would like to put off the TKR as long as possible. 3.  Foot pain and unsteadiness: Initially had significant improvement with use of the orthotics.  Now she can still having some foot tenderness particularly if she walks long distances.  She had her first evaluation for gait abnormality through physical therapy and they have placed her on a home exercise program for that.   REVIEW of systems: Negative for fever, negative for unusual weight change.  No suicidal or homicidal ideation.  Please see HPI for additional pertinent review of systems  OBJECTIVE: General: Well-developed female no acute distress Cardiovascular:  Regular rate and rhythm LUNGS: Clear to auscultation bilaterally abdomen: Soft, no masses. PSYCH: AxOx4. Good eye contact.. No psychomotor retardation or agitation. Appropriate speech fluency and content. Asks and answers questions appropriately. Mood is congruent.  Assessment: Depression and anxiety, improved.  We will continue the current dose.  Follow-up 1 to 2 months 2.  Knee pain: I think giving physical therapy a try is worth it.  She could also try corticosteroid injection which we discussed. #3.  Foot pain and gait abnormality: I do think these are intertwined.  It does not seem like she has symptoms of peripheral neuropathy.  We will see how the physical therapy does for her and recommend she continue to try the orthotics.

## 2018-09-23 NOTE — Progress Notes (Signed)
Take 8 mg coumadin today then resume schedule of 5 mg Sun, Wed and 6 mg all other days. Follow up in 1 week for repeat INR. Jaymes Graff Johnika Escareno

## 2018-09-29 ENCOUNTER — Telehealth: Payer: Self-pay | Admitting: Family Medicine

## 2018-09-29 ENCOUNTER — Ambulatory Visit: Payer: Medicare HMO | Attending: Family Medicine | Admitting: Physical Therapy

## 2018-09-29 ENCOUNTER — Other Ambulatory Visit: Payer: Self-pay

## 2018-09-29 ENCOUNTER — Ambulatory Visit (INDEPENDENT_AMBULATORY_CARE_PROVIDER_SITE_OTHER): Payer: Medicare HMO | Admitting: *Deleted

## 2018-09-29 DIAGNOSIS — M25551 Pain in right hip: Secondary | ICD-10-CM | POA: Insufficient documentation

## 2018-09-29 DIAGNOSIS — G8929 Other chronic pain: Secondary | ICD-10-CM | POA: Insufficient documentation

## 2018-09-29 DIAGNOSIS — R2689 Other abnormalities of gait and mobility: Secondary | ICD-10-CM | POA: Diagnosis not present

## 2018-09-29 DIAGNOSIS — Z7901 Long term (current) use of anticoagulants: Secondary | ICD-10-CM

## 2018-09-29 DIAGNOSIS — M25561 Pain in right knee: Secondary | ICD-10-CM | POA: Insufficient documentation

## 2018-09-29 LAB — POCT INR: INR: 1.5 — AB (ref 2.0–3.0)

## 2018-09-29 NOTE — Telephone Encounter (Signed)
Clinical info completed on Handicap form.  Place form in Dr. Verlon Au box for completion.  Yazlyn Wentzel Zimmerman Rumple, CMA

## 2018-09-29 NOTE — Telephone Encounter (Signed)
Handicap form dropped off for at front desk for completion.  Verified that patient section of form has been completed.  Last DOS/WCC with PCP was 09/29/18.  Placed form in team folder to be completed by clinical staff.  Crista Luria

## 2018-09-29 NOTE — Therapy (Addendum)
[place], [place], [address] Phone: [phone]   Fax:  [phone]  Physical Therapy Treatment/Discharge   Patient Details  Name: SAFIYYA STOKES MRN: [id number] Date of Birth: 1950-06-24 Referring Provider (PT): Dr Mallie Mussel    Encounter Date: 09/29/2018  PT End of Session - 09/29/18 0816    Visit Number  2    Number of Visits  6    Date for PT Re-Evaluation  10/14/18    Authorization Type  [employer] medicare    PT Start Time  0800    PT Stop Time  0841    PT Time Calculation (min)  41 min    Activity Tolerance  Patient tolerated treatment well    Behavior During Therapy  [facility] for tasks assessed/performed       Past Medical History:  Diagnosis Date  . Anemia   . Anxiety   . Arthritis   . Depression   . Dysrhythmia    occ due to caffeine intake  . Encounter for long-term (current) use of other medications   . GERD (gastroesophageal reflux disease)   . Hepatitis   . History of hiatal hernia   . Hypertension   . IBS (irritable bowel syndrome)   . Obesity   . Osteoarthritis   . Pain in joint, multiple sites   . Pneumonia   . PONV (postoperative nausea and vomiting)    in the 1980's no problems since  . Psoriatic arthropathy ([place])   . Pulmonary embolism ([place]) 12/03/2016   submassive/notes 12/03/2016  . Raynaud disease     Past Surgical History:  Procedure Laterality Date  . ABDOMINAL HYSTERECTOMY    . COLONOSCOPY    . GASTRIC BYPASS    . HALLUX VALGUS CORRECTION  09/2010   Dr Arnetha Courser; right 1st ray  . HERNIA REPAIR    . JOINT REPLACEMENT     left knee times 3  . LAPAROSCOPIC CHOLECYSTECTOMY  04/2000   Archie Endo 07/03/2010  . LAPAROSCOPIC INCISIONAL / UMBILICAL / VENTRAL HERNIA REPAIR  09/2002   [facility]/notes 07/03/2010  . REVISION TOTAL KNEE ARTHROPLASTY Left 10/2003   Archie Endo 07/03/2010  . REVISION TOTAL KNEE ARTHROPLASTY Left 02/2007   Archie Endo 06/21/2010  . SHOULDER HEMI-ARTHROPLASTY Right  06/28/2016   Procedure: RIGHT SHOULDER HEMI-ARTHROPLASTY;  Surgeon: Netta Cedars, MD;  Location: [facility];  Service: Orthopedics;  Laterality: Right;  . TOTAL KNEE ARTHROPLASTY Left 10/1999   Archie Endo 07/03/2010  . TUBAL LIGATION      There were no vitals filed for this visit.  Subjective Assessment - 09/29/18 0808    Subjective  Patient reports she feels like her knee is getting a little better. She had her voltarin gell today. She has been able to do her exercises. After the first visit she had severe pain.    Pertinent History  right knee replacement, left knee OA, right shoulder replacement;    How long can you sit comfortably?  No limit but feet do hurt at times    How long can you stand comfortably?  No limit    Currently in Pain?  Yes    Pain Score  5     Pain Location  Knee    Pain Orientation  Left    Pain Descriptors / Indicators  Aching    Pain Type  Chronic pain    Pain Onset  More than a month ago    Pain Frequency  Constant    Aggravating Factors  standing and walking    Pain Relieving Factors  difficulty perfroming ADL's    Effect of Pain on Daily Activities  pain with ambualtion                       [facility] Adult PT Treatment/Exercise - 09/29/18 0001      Ambulation/Gait   Gait Comments  right foot pronation; decreased right single leg stance; significant trendelenburgh gait; gait training with cane. Mod cuing for proper use of the cane. Significant improvement in trendelenburgh gait       Posture/Postural Control   Posture/Postural Control  Postural limitations    Postural Limitations  Rounded Shoulders;Forward head      Self-Care   Self-Care  Other Self-Care Comments    Other Self-Care Comments   revied how to integrate exercises into her current home program       Lumbar Exercises: Stretches   Passive Hamstring Stretch Limitations  seated 3x15 sec hold       Knee/Hip Exercises: Standing   Heel Raises Limitations  20    Knee Flexion Limitations   marching x20     Abduction Limitations  x10 bilateral     Extension Limitations  x10 bilateral       Knee/Hip Exercises: Seated   Other Seated Knee/Hip Exercises  green     Other Seated Knee/Hip Exercises  2x10       Knee/Hip Exercises: Supine   Quad Sets Limitations  x10 5 sec hold     Bridges Limitations  2x10    Straight Leg Raises Limitations  2x5 with right x10 with right     Other Supine Knee/Hip Exercises  supine march 2x10              PT Education - 09/29/18 0810    Education Details  updsated HEP, symptom mangement    Person(s) Educated  Patient    Methods  Explanation;Demonstration;Tactile cues;Verbal cues    Comprehension  Verbalized understanding;Returned demonstration;Verbal cues required;Tactile cues required       PT Short Term Goals - 09/29/18 1207      PT SHORT TERM GOAL #1   Title  Patient will increase grosss right LE strength to 4+/5    Time  3    Period  Weeks    Status  New    Target Date  09/23/18      PT SHORT TERM GOAL #2   Title  Patient will demonstrate improved gait technique with a cane without cuing    Time  3    Period  Weeks    Status  On-going      PT SHORT TERM GOAL #3   Title  Patient will be independent with initial HEP    Time  3    Period  Weeks    Status  On-going        PT Long Term Goals - 09/02/18 1236      PT LONG TERM GOAL #1   Title  Patient will ambualte 2000' with improved gait technique in order to improve endurance in the community    Time  6    Period  Weeks    Status  New      PT LONG TERM GOAL #2   Title  Patient will be independent with high level balance activity    Time  6    Period  Weeks    Status  New    Target Date  10/14/18      PT LONG TERM GOAL #3   Title  Patient will demonstrate 37% limitation on FOTO    Time  6    Period  Weeks    Status  New    Target Date  10/14/18            Plan - 09/29/18 5284    Clinical Impression Statement  good tolerance to ther-ex. Therapy  added SLR and bridging. She had no increase in pain with exercises. She also was given standing exercises.She will go home and practice her exercises for a month. She will come back and therapy will review balance exercises.    Personal Factors and Comorbidities  Comorbidity 1;Comorbidity 2;Comorbidity 3+    Comorbidities  right shoulder replacement with dysfunctional outcome per patient, left knee replacement, bilateral flat foot    Clinical Decision Making  Moderate    Rehab Potential  Good    PT Frequency  1x / week    PT Duration  6 weeks    PT Treatment/Interventions  ADLs/Self Care Home Management;Cryotherapy;Electrical Stimulation;Iontophoresis 15m/ml Dexamethasone;Ultrasound;Moist Heat;Contrast Bath;Gait training;Stair training;Functional mobility training;Therapeutic activities;Therapeutic exercise;Patient/family education;Dry needling;Passive range of motion;Taping;Neuromuscular re-education;Manual techniques    PT Next Visit Plan  add standing stability exercises. Review HEP, hasmtring stretching, gait training, add standing marching and weight shiting forward; consider balance exercises as well    PT Home Exercise Plan  quad set, glut set, supine march, hip abduction with band, gastroc stretch    Consulted and Agree with Plan of Care  Patient       Patient will benefit from skilled therapeutic intervention in order to improve the following deficits and impairments:  Abnormal gait, Decreased mobility, Decreased endurance, Difficulty walking, Decreased activity tolerance, Decreased safety awareness, Decreased strength, Pain, Decreased range of motion, Increased muscle spasms, Improper body mechanics  Visit Diagnosis: 1. Other abnormalities of gait and mobility   2. Chronic pain of right knee   3. Pain in right hip        Problem List Patient Active Problem List   Diagnosis Date Noted  . Metatarsalgia of both feet 08/30/2018  . Gait abnormality 08/21/2018  . Long term (current)  use of anticoagulants 06/24/2017  . Collagenous colitis 05/07/2017  . Hx vasculitis leukocytoclastic 2018 05/07/2017  . GAD (generalized anxiety disorder)   . PE [place] 12/03/2016  . S/P shoulder replacement, right 06/28/2016  . Vitamin D deficiency 12/05/2015  . Vitamin B 12 deficiency 12/05/2015  . AC (acromioclavicular) joint arthritis 08/29/2015  . Well adult health check 09/01/2013  . Hammertoe 11/20/2012  . Psoriatic arthritis ([place] 04/10/2012  . Pulmonary nodule seen on imaging study 11/08/2011  . Dysthymia 12/12/2010  . Pain in joint, shoulder region 10/03/2008  . Essential hypertension 04/27/2008  . Palpitations 10/30/2007  . Enthesopathy of ankle and tarsus 10/02/2006  . KNEE REPLACEMENT, LEFT, HX OF 07/03/2006  . OBESITY, NOS 04/17/2006  . PANIC ATTACKS 04/17/2006  . GASTROESOPHAGEAL REFLUX, NO ESOPHAGITIS 04/17/2006  . CHOLELITHIASIS, NOS 04/17/2006  . VAGINITIS, ATROPHIC 04/17/2006  . Pain in joint 04/17/2006    PHYSICAL THERAPY DISCHARGE SUMMARY  Visits from Start of Care: 2  Current functional level related to goals / functional outcomes: Has an HEP   Remaining deficits: Continued pain but was only coming to get exercises to work on over time 2nd to high co-pay.    Education / Equipment: HEP  Plan: Patient agrees to discharge.  Patient goals were not met. Patient is  being discharged due to not returning since the last visit.  ?????      Carney Living PT DPT  09/29/2018, 12:16 PM  [facility] [address] [place], [place], [address] Phone: [phone]   Fax:  [phone]  Name: NELLIE PESTER MRN: [id number] Date of Birth: [birth year]

## 2018-09-30 ENCOUNTER — Ambulatory Visit: Payer: Medicare HMO

## 2018-10-08 ENCOUNTER — Other Ambulatory Visit: Payer: Self-pay

## 2018-10-08 ENCOUNTER — Ambulatory Visit (INDEPENDENT_AMBULATORY_CARE_PROVIDER_SITE_OTHER): Payer: Medicare HMO | Admitting: *Deleted

## 2018-10-08 DIAGNOSIS — Z7901 Long term (current) use of anticoagulants: Secondary | ICD-10-CM | POA: Diagnosis not present

## 2018-10-08 LAB — POCT INR: INR: 1.8 — AB (ref 2.0–3.0)

## 2018-10-14 ENCOUNTER — Other Ambulatory Visit: Payer: Self-pay

## 2018-10-14 ENCOUNTER — Ambulatory Visit (INDEPENDENT_AMBULATORY_CARE_PROVIDER_SITE_OTHER): Payer: Medicare HMO

## 2018-10-14 DIAGNOSIS — I2699 Other pulmonary embolism without acute cor pulmonale: Secondary | ICD-10-CM

## 2018-10-14 DIAGNOSIS — Z7901 Long term (current) use of anticoagulants: Secondary | ICD-10-CM

## 2018-10-14 LAB — POCT INR: INR: 1.8 — AB (ref 2.0–3.0)

## 2018-10-15 NOTE — Telephone Encounter (Signed)
Done and given to pt

## 2018-10-21 ENCOUNTER — Other Ambulatory Visit: Payer: Self-pay

## 2018-10-21 ENCOUNTER — Ambulatory Visit (INDEPENDENT_AMBULATORY_CARE_PROVIDER_SITE_OTHER): Payer: Medicare HMO | Admitting: *Deleted

## 2018-10-21 DIAGNOSIS — Z7901 Long term (current) use of anticoagulants: Secondary | ICD-10-CM | POA: Diagnosis not present

## 2018-10-21 LAB — POCT INR: INR: 2 (ref 2.0–3.0)

## 2018-10-29 ENCOUNTER — Other Ambulatory Visit: Payer: Self-pay

## 2018-10-29 ENCOUNTER — Ambulatory Visit (INDEPENDENT_AMBULATORY_CARE_PROVIDER_SITE_OTHER): Payer: Medicare HMO | Admitting: *Deleted

## 2018-10-29 DIAGNOSIS — Z7901 Long term (current) use of anticoagulants: Secondary | ICD-10-CM | POA: Diagnosis not present

## 2018-10-29 LAB — POCT INR: INR: 1.9 — AB (ref 2.0–3.0)

## 2018-11-02 ENCOUNTER — Ambulatory Visit: Payer: Medicare HMO | Admitting: Physical Therapy

## 2018-11-03 ENCOUNTER — Encounter: Payer: Self-pay | Admitting: Family Medicine

## 2018-11-05 ENCOUNTER — Other Ambulatory Visit: Payer: Self-pay

## 2018-11-05 ENCOUNTER — Ambulatory Visit (INDEPENDENT_AMBULATORY_CARE_PROVIDER_SITE_OTHER): Payer: Medicare HMO | Admitting: *Deleted

## 2018-11-05 DIAGNOSIS — Z7901 Long term (current) use of anticoagulants: Secondary | ICD-10-CM

## 2018-11-05 LAB — POCT INR: INR: 2.1 (ref 2.0–3.0)

## 2018-11-10 ENCOUNTER — Other Ambulatory Visit: Payer: Self-pay | Admitting: Family Medicine

## 2018-11-17 ENCOUNTER — Other Ambulatory Visit: Payer: Self-pay | Admitting: *Deleted

## 2018-11-17 MED ORDER — ALPRAZOLAM 1 MG PO TABS: ORAL_TABLET | ORAL | 5 refills | Status: DC

## 2018-11-19 ENCOUNTER — Other Ambulatory Visit: Payer: Self-pay

## 2018-11-19 ENCOUNTER — Ambulatory Visit (INDEPENDENT_AMBULATORY_CARE_PROVIDER_SITE_OTHER): Payer: Medicare HMO | Admitting: *Deleted

## 2018-11-19 DIAGNOSIS — Z7901 Long term (current) use of anticoagulants: Secondary | ICD-10-CM | POA: Diagnosis not present

## 2018-11-19 LAB — POCT INR: INR: 2 (ref 2.0–3.0)

## 2018-12-03 ENCOUNTER — Other Ambulatory Visit: Payer: Self-pay

## 2018-12-03 ENCOUNTER — Ambulatory Visit (INDEPENDENT_AMBULATORY_CARE_PROVIDER_SITE_OTHER): Payer: Medicare HMO | Admitting: *Deleted

## 2018-12-03 DIAGNOSIS — Z7901 Long term (current) use of anticoagulants: Secondary | ICD-10-CM

## 2018-12-03 LAB — POCT INR: INR: 1.7 — AB (ref 2.0–3.0)

## 2018-12-16 ENCOUNTER — Other Ambulatory Visit: Payer: Self-pay

## 2018-12-16 ENCOUNTER — Encounter: Payer: Self-pay | Admitting: Family Medicine

## 2018-12-16 ENCOUNTER — Ambulatory Visit (INDEPENDENT_AMBULATORY_CARE_PROVIDER_SITE_OTHER): Payer: Medicare HMO | Admitting: Family Medicine

## 2018-12-16 VITALS — BP 122/60 | HR 72 | Ht 59.0 in | Wt 178.0 lb

## 2018-12-16 DIAGNOSIS — F341 Dysthymic disorder: Secondary | ICD-10-CM

## 2018-12-16 DIAGNOSIS — L4050 Arthropathic psoriasis, unspecified: Secondary | ICD-10-CM | POA: Diagnosis not present

## 2018-12-16 DIAGNOSIS — I10 Essential (primary) hypertension: Secondary | ICD-10-CM | POA: Diagnosis not present

## 2018-12-16 DIAGNOSIS — I2699 Other pulmonary embolism without acute cor pulmonale: Secondary | ICD-10-CM | POA: Diagnosis not present

## 2018-12-16 DIAGNOSIS — Z96611 Presence of right artificial shoulder joint: Secondary | ICD-10-CM

## 2018-12-16 DIAGNOSIS — R269 Unspecified abnormalities of gait and mobility: Secondary | ICD-10-CM

## 2018-12-16 DIAGNOSIS — R5383 Other fatigue: Secondary | ICD-10-CM | POA: Diagnosis not present

## 2018-12-16 LAB — POCT INR: INR: 1.8 — AB (ref 2.0–3.0)

## 2018-12-16 NOTE — Patient Instructions (Addendum)
[employer] is a website I used for online purchase of pretty reasonable support hose.  I will send you note about your labs. I recommend you call your husband's neurologist and see if they can get him back in for further evaluation.   KEEP UP YOUR EXERCISES! YOU WILL FEEL BETTER!

## 2018-12-17 ENCOUNTER — Encounter: Payer: Self-pay | Admitting: Family Medicine

## 2018-12-17 LAB — CBC
Hematocrit: 38 % (ref 34.0–46.6)
Hemoglobin: 12.7 g/dL (ref 11.1–15.9)
MCH: 29.1 pg (ref 26.6–33.0)
MCHC: 33.4 g/dL (ref 31.5–35.7)
MCV: 87 fL (ref 79–97)
Platelets: 229 10*3/uL (ref 150–450)
RBC: 4.37 x10E6/uL (ref 3.77–5.28)
RDW: 12.6 % (ref 11.7–15.4)
WBC: 5.2 10*3/uL (ref 3.4–10.8)

## 2018-12-17 LAB — FERRITIN: Ferritin: 27 ng/mL (ref 15–150)

## 2018-12-17 LAB — TSH: TSH: 2.88 u[IU]/mL (ref 0.450–4.500)

## 2018-12-18 NOTE — Progress Notes (Signed)
    CHIEF COMPLAINT / HPI: #1.  Has decided to get the shingles vaccine and would like me to give her prescription for that.  Would also like one for her husband. 2.  Doing pretty well on current dose of antidepressant.  Still not as active as she thinks she needs to be.  Mood is much more stable however.  No side effects. 3.  Was seen at physical therapy for work gait evaluation and was doing really well but in the last couple of weeks she has not been doing her exercises as much and feels like she is regressing somewhat. 4.  Last summer she had a fall and injured her left lower leg.  There is still some skin changes and a sore area there.  She is concerned about that 5.  Right shoulder pain.  Status post surgery.  This really hampers her ability to get up from the floor if she does fall.  Says she has been in physical therapy it did not really help much.  REVIEW OF SYSTEMS: No unusual weight change, no cough, see HPI.  PERTINENT  PMH / PSH: I have reviewed the patient's medications, allergies, past medical and surgical history, smoking status and updated in the EMR as appropriate.   OBJECTIVE:  Vital signs reviewed. GENERAL: Well-developed, well-nourished, no acute distress. CARDIOVASCULAR: Regular rate and rhythm no murmur gallop or rub LUNGS: Clear to auscultation bilaterally, no rales or wheeze. ABDOMEN: Soft positive bowel sounds NEURO: No gross focal neurological deficits. MSK: Movement of extremity x 4. Left lower leg has 2 well-healed scars with some slight depression of the scar.  Each is about 3 to 5 cm in length.  The scar is nontender.  The area between the scars there is a dime sized area that is slightly tender to palpation.  There is no mass here.  There is no defect noted.  It is over the anterior tibialis muscle.  She has intact strength in dorsiflexion on the left compared with the right. .PSYCH: AxOx4. Good eye contact.. No psychomotor retardation or agitation. Appropriate  speech fluency and content. Asks and answers questions appropriately. Mood is congruent.      ASSESSMENT / PLAN:   Dysthymia Doing better on her current dose so we will continue that dose and follow-up 3 months.  Sooner with problems.  S/P shoulder replacement, right He still has a lot of weakness in the right upper extremity but does not want to retry physical therapy.  Orthopedic surgery said her next option would be a fusion she does not want do that at this time either  Gait abnormality I encouraged her to start back on her exercises.  Would be a tragedy if she were to fall and break a hip.

## 2018-12-18 NOTE — Assessment & Plan Note (Signed)
Doing better on her current dose so we will continue that dose and follow-up 3 months.  Sooner with problems.

## 2018-12-18 NOTE — Assessment & Plan Note (Signed)
He still has a lot of weakness in the right upper extremity but does not want to retry physical therapy.  Orthopedic surgery said her next option would be a fusion she does not want do that at this time either

## 2018-12-18 NOTE — Assessment & Plan Note (Signed)
I encouraged her to start back on her exercises.  Would be a tragedy if she were to fall and break a hip.

## 2018-12-21 ENCOUNTER — Other Ambulatory Visit: Payer: Self-pay | Admitting: Family Medicine

## 2018-12-30 ENCOUNTER — Other Ambulatory Visit: Payer: Self-pay

## 2018-12-30 ENCOUNTER — Ambulatory Visit (INDEPENDENT_AMBULATORY_CARE_PROVIDER_SITE_OTHER): Payer: Medicare HMO | Admitting: *Deleted

## 2018-12-30 DIAGNOSIS — Z7901 Long term (current) use of anticoagulants: Secondary | ICD-10-CM | POA: Diagnosis not present

## 2018-12-30 LAB — POCT INR: INR: 1.8 — AB (ref 2.0–3.0)

## 2019-01-13 ENCOUNTER — Ambulatory Visit (INDEPENDENT_AMBULATORY_CARE_PROVIDER_SITE_OTHER): Payer: Medicare HMO | Admitting: *Deleted

## 2019-01-13 ENCOUNTER — Other Ambulatory Visit: Payer: Self-pay

## 2019-01-13 DIAGNOSIS — Z7901 Long term (current) use of anticoagulants: Secondary | ICD-10-CM | POA: Diagnosis not present

## 2019-01-13 LAB — POCT INR: INR: 2.1 (ref 2.0–3.0)

## 2019-01-21 ENCOUNTER — Encounter: Payer: Self-pay | Admitting: Family Medicine

## 2019-01-27 ENCOUNTER — Other Ambulatory Visit: Payer: Self-pay

## 2019-01-27 ENCOUNTER — Ambulatory Visit (INDEPENDENT_AMBULATORY_CARE_PROVIDER_SITE_OTHER): Payer: Medicare HMO | Admitting: *Deleted

## 2019-01-27 DIAGNOSIS — Z7901 Long term (current) use of anticoagulants: Secondary | ICD-10-CM

## 2019-01-27 LAB — POCT INR: INR: 2.3 (ref 2.0–3.0)

## 2019-02-17 ENCOUNTER — Ambulatory Visit (INDEPENDENT_AMBULATORY_CARE_PROVIDER_SITE_OTHER): Payer: Medicare HMO | Admitting: *Deleted

## 2019-02-17 ENCOUNTER — Other Ambulatory Visit: Payer: Self-pay

## 2019-02-17 DIAGNOSIS — Z7901 Long term (current) use of anticoagulants: Secondary | ICD-10-CM | POA: Diagnosis not present

## 2019-02-17 LAB — POCT INR: INR: 2 (ref 2.0–3.0)

## 2019-02-26 ENCOUNTER — Other Ambulatory Visit: Payer: Self-pay

## 2019-02-26 MED ORDER — DICLOFENAC SODIUM 75 MG PO TBEC: DELAYED_RELEASE_TABLET | ORAL | 3 refills | Status: DC

## 2019-02-26 MED ORDER — BENAZEPRIL HCL 20 MG PO TABS: ORAL_TABLET | ORAL | 3 refills | Status: DC

## 2019-02-26 MED ORDER — VENLAFAXINE HCL ER 150 MG PO CP24: 150.0000 mg | ORAL_CAPSULE | Freq: Every day | ORAL | 3 refills | Status: DC

## 2019-02-26 MED ORDER — ALPRAZOLAM 1 MG PO TABS: ORAL_TABLET | ORAL | 5 refills | Status: DC

## 2019-02-26 MED ORDER — WARFARIN SODIUM 2 MG PO TABS: ORAL_TABLET | ORAL | 3 refills | Status: DC

## 2019-02-26 MED ORDER — FUROSEMIDE 20 MG PO TABS: ORAL_TABLET | ORAL | 3 refills | Status: DC

## 2019-03-03 ENCOUNTER — Ambulatory Visit (INDEPENDENT_AMBULATORY_CARE_PROVIDER_SITE_OTHER): Payer: Medicare Other | Admitting: *Deleted

## 2019-03-03 ENCOUNTER — Other Ambulatory Visit: Payer: Self-pay

## 2019-03-03 DIAGNOSIS — Z7901 Long term (current) use of anticoagulants: Secondary | ICD-10-CM | POA: Diagnosis not present

## 2019-03-03 LAB — POCT INR: INR: 2.5 (ref 2.0–3.0)

## 2019-03-05 MED ORDER — ALPRAZOLAM 1 MG PO TABS: ORAL_TABLET | ORAL | 5 refills | Status: DC

## 2019-03-05 NOTE — Addendum Note (Signed)
Addended by: Valerie Roys on: 03/05/2019 04:02 PM   Modules accepted: Orders

## 2019-03-05 NOTE — Telephone Encounter (Signed)
Alprazolam script was sent to the wrong pharmacy.  Request coming from [facility] for the refill.  Will send to MD. [name]

## 2019-03-09 ENCOUNTER — Telehealth: Payer: Self-pay

## 2019-03-11 ENCOUNTER — Telehealth: Payer: Self-pay

## 2019-03-11 MED ORDER — VENLAFAXINE HCL 75 MG PO TABS: ORAL_TABLET | ORAL | 3 refills | Status: DC

## 2019-03-11 NOTE — Telephone Encounter (Signed)
[facility] calls nursing line regarding changing effexor 75mg  tablets to capsules. Pt has Effexor-XR capsules sent to [facility]. Please advise if this is an appropriate change for her medication management.  Reference number: [id number]  To PCP  Please advise  Talbot Grumbling, RN

## 2019-03-12 NOTE — Telephone Encounter (Signed)
Dear [name] Team SURE Dorcas Mcmurray

## 2019-03-15 DIAGNOSIS — H5213 Myopia, bilateral: Secondary | ICD-10-CM | POA: Diagnosis not present

## 2019-03-15 DIAGNOSIS — H2513 Age-related nuclear cataract, bilateral: Secondary | ICD-10-CM | POA: Diagnosis not present

## 2019-03-15 DIAGNOSIS — H52223 Regular astigmatism, bilateral: Secondary | ICD-10-CM | POA: Diagnosis not present

## 2019-03-15 DIAGNOSIS — H524 Presbyopia: Secondary | ICD-10-CM | POA: Diagnosis not present

## 2019-03-16 ENCOUNTER — Encounter: Payer: Self-pay | Admitting: Family Medicine

## 2019-03-16 NOTE — Telephone Encounter (Signed)
[facility] sent a request for the medication to be changed form tablets to capsules. This way the pt wont have to pay for the medication. Ottis Stain, CMA

## 2019-03-17 ENCOUNTER — Other Ambulatory Visit: Payer: Self-pay | Admitting: Family Medicine

## 2019-03-17 MED ORDER — AMOXICILLIN 500 MG PO CAPS: ORAL_CAPSULE | ORAL | 0 refills | Status: DC

## 2019-03-17 NOTE — Telephone Encounter (Signed)
Contacted Pharmacist Judeen Hammans at [facility] and gave her the ok so that the Rx mentioned below would be covered by insurance. [name], CMA

## 2019-03-31 ENCOUNTER — Other Ambulatory Visit: Payer: Self-pay

## 2019-03-31 ENCOUNTER — Ambulatory Visit (INDEPENDENT_AMBULATORY_CARE_PROVIDER_SITE_OTHER): Payer: Medicare Other | Admitting: *Deleted

## 2019-03-31 DIAGNOSIS — Z7901 Long term (current) use of anticoagulants: Secondary | ICD-10-CM | POA: Diagnosis not present

## 2019-03-31 LAB — POCT INR: INR: 2.2 (ref 2.0–3.0)

## 2019-04-02 ENCOUNTER — Encounter: Payer: Self-pay | Admitting: Family Medicine

## 2019-04-19 ENCOUNTER — Encounter: Payer: Self-pay | Admitting: Student in an Organized Health Care Education/Training Program

## 2019-04-19 ENCOUNTER — Other Ambulatory Visit: Payer: Self-pay

## 2019-04-19 ENCOUNTER — Ambulatory Visit (INDEPENDENT_AMBULATORY_CARE_PROVIDER_SITE_OTHER): Payer: Medicare Other | Admitting: Student in an Organized Health Care Education/Training Program

## 2019-04-19 ENCOUNTER — Ambulatory Visit
Admission: RE | Admit: 2019-04-19 | Discharge: 2019-04-19 | Disposition: A | Discharge status: Home / Self Care | Payer: Medicare Other | Source: Ambulatory Visit | Attending: Family Medicine | Admitting: Family Medicine

## 2019-04-19 VITALS — BP 110/75 | HR 77 | Temp 97.8°F | Wt 180.0 lb

## 2019-04-19 DIAGNOSIS — S299XXA Unspecified injury of thorax, initial encounter: Secondary | ICD-10-CM | POA: Diagnosis not present

## 2019-04-19 DIAGNOSIS — W19XXXA Unspecified fall, initial encounter: Secondary | ICD-10-CM

## 2019-04-19 DIAGNOSIS — R0781 Pleurodynia: Secondary | ICD-10-CM

## 2019-04-19 MED ORDER — TRAMADOL HCL 50 MG PO TABS: 50.0000 mg | ORAL_TABLET | Freq: Three times a day (TID) | ORAL | 0 refills | Status: AC | PRN

## 2019-04-19 NOTE — Patient Instructions (Signed)
It was a pleasure to see you today!  To summarize our discussion for this visit:  It looks as though you may have a rib fracture. We are getting an xray of your chest which can help let us know. In the meantime, you can take pain medication as needed and bind but the main goal is to take BIG DEEP BREATHS to help prevent lung infections.  Please follow up with your PCP in a week or so if not feeling better  You can still go ahead with your COVID vaccine  Some additional health maintenance measures we should update are: Health Maintenance Due  Topic Date Due  . Hepatitis C Screening  July 25, 1950  . DEXA SCAN  02/20/2017  . PNA vac Low Risk Adult (2 of 2 - PPSV23) 10/29/2018  .    Call the clinic at [phone] if your symptoms worsen or you have any concerns.   Thank you for allowing me to take part in your care,  Dr. Doristine Mango

## 2019-04-19 NOTE — Assessment & Plan Note (Addendum)
Concern for rib fracture on left, possibly just muscular. Respiratory status normal so low suspicion for pneumo. precepted with Dr. McDiarmid on whether head imaging was recommended in patient with fall on blood thinner. Given that she has no focal changes on physical exam and fall was several days ago, will not obtain head imaging today. Sent patient to receive 2 view chest xray and discussed sensitivity Treating with tramadol and binder and had informed discussion with patient on increased risk of pneumonia with rib fractures in patients >65 so important for deep breathing. Recommended incentive spirometer. Patient endorses understanding. Called patient with xray results that there was no fracture seen but still has reserve suspicion that there is an occult fracture. Tramadol is not helping much but she has "something stronger" left over from her PCP that she takes intermittently that helps. Recommended patient contact her PCP for further refills of this medication if needed.

## 2019-04-19 NOTE — Progress Notes (Signed)
    SUBJECTIVE:   CHIEF COMPLAINT / HPI: fall  Patient on warfarin experienced mechanical fall Thursday (5 days ago). Tripped over toy while playing with great grandson. Landed on floor by knee first which had some initial bruising and pain but is now healed and has no issues with ambulation. Has h/o knee arthroplasty.  She also hit her left rib cage and left face on the ground. Her rib cage is tender to palpation and with motion of ribs and shoulder. Worse with taking deep breaths. Comes in today for this pain. Her head has a bruise on left forehead and is non-tender. She did not lose consciousness or have change in vision. No headache. There was some initial swelling at the site of the bruise which has now resolved.   PERTINENT  PMH / PSH: on warfarin  OBJECTIVE:   BP 110/75   Pulse 77   Temp 97.8 F (36.6 C) (Oral)   Wt 180 lb (81.6 kg)   SpO2 99%   BMI 36.36 kg/m   General: NAD, pleasant, able to participate in exam Respiratory: CTAB, normal effort, No wheezes, rales or rhonchi. Tender to palpation of ~T7-10 under breast tissue. Negative for crepitus or flailing with breaths. No ecchymosis.  Abdomen: soft, nontender, nondistended, no hepatic or splenomegaly, +BS Extremities: no edema or cyanosis. WWP. Left knee- negative for bruising.  Skin: warm and dry, no rashes noted Neuro: alert and oriented x4, no focal deficits Cranial nerves II-XII grossly intact. Strength and sensation symmetrical and full in bilateral extremities.  Psych: Normal affect and mood  ASSESSMENT/PLAN:   Fall Concern for rib fracture on left, possibly just muscular. Respiratory status normal so low suspicion for pneumo. precepted with Dr. McDiarmid on whether head imaging was recommended in patient with fall on blood thinner. Given that she has no focal changes on physical exam and fall was several days ago, will not obtain head imaging today. Sent patient to receive 2 view chest xray and discussed  sensitivity Treating with tramadol and binder and had informed discussion with patient on increased risk of pneumonia with rib fractures in patients >65 so important for deep breathing. Recommended incentive spirometer. Patient endorses understanding. Called patient with xray results that there was no fracture seen but still has reserve suspicion that there is an occult fracture. Tramadol is not helping much but she has "something stronger" left over from her PCP that she takes intermittently that helps. Recommended patient contact her PCP for further refills of this medication if needed.      [place]

## 2019-04-22 ENCOUNTER — Ambulatory Visit: Payer: Medicare Other | Attending: Internal Medicine

## 2019-04-22 DIAGNOSIS — Z23 Encounter for immunization: Secondary | ICD-10-CM

## 2019-04-22 NOTE — Progress Notes (Signed)
   Covid-19 Vaccination Clinic  Name:  MCCOY CHAO    MRN: [id number] DOB: 1951-01-28  04/22/2019  Ms. Nakamoto was observed post Covid-19 immunization for 15 minutes without incident. She was provided with Vaccine Information Sheet and instruction to access the V-Safe system.   Ms. Mattsson was instructed to call 911 with any severe reactions post vaccine: Marland Kitchen Difficulty breathing  . Swelling of face and throat  . A fast heartbeat  . A bad rash all over body  . Dizziness and weakness

## 2019-04-28 ENCOUNTER — Other Ambulatory Visit: Payer: Self-pay

## 2019-04-28 ENCOUNTER — Ambulatory Visit (INDEPENDENT_AMBULATORY_CARE_PROVIDER_SITE_OTHER): Payer: Medicare Other | Admitting: *Deleted

## 2019-04-28 DIAGNOSIS — Z7901 Long term (current) use of anticoagulants: Secondary | ICD-10-CM

## 2019-04-28 LAB — POCT INR: INR: 4 — AB (ref 2.0–3.0)

## 2019-04-28 NOTE — Progress Notes (Signed)
Patient fell a few weeks ago, has bruising around the face and a fractured rib. She states she was on antibiotics, but has not taking any in the last two weeks. She is also on tramadol prn. Jaymes Graff Nyra Anspaugh

## 2019-05-05 ENCOUNTER — Ambulatory Visit (INDEPENDENT_AMBULATORY_CARE_PROVIDER_SITE_OTHER): Payer: Medicare Other | Admitting: *Deleted

## 2019-05-05 ENCOUNTER — Other Ambulatory Visit: Payer: Self-pay

## 2019-05-05 DIAGNOSIS — Z7901 Long term (current) use of anticoagulants: Secondary | ICD-10-CM

## 2019-05-05 LAB — POCT INR: INR: 4 — AB (ref 2.0–3.0)

## 2019-05-15 ENCOUNTER — Telehealth: Payer: Medicare Other | Admitting: Emergency Medicine

## 2019-05-15 DIAGNOSIS — H9209 Otalgia, unspecified ear: Secondary | ICD-10-CM

## 2019-05-15 MED ORDER — AMOXICILLIN 500 MG PO CAPS: 500.0000 mg | ORAL_CAPSULE | Freq: Three times a day (TID) | ORAL | 0 refills | Status: DC

## 2019-05-15 MED ORDER — CIPROFLOXACIN-DEXAMETHASONE 0.3-0.1 % OT SUSP: OTIC | 0 refills | Status: DC

## 2019-05-15 NOTE — Addendum Note (Signed)
Addended by: Montine Circle B on: 05/15/2019 11:20 AM   Modules accepted: Orders

## 2019-05-15 NOTE — Progress Notes (Signed)
E Visit for Swimmer's Ear  We are sorry that you are not feeling well. Here is how we plan to help!  I have prescribed Ciprodex ear drops.  Due to this being a topical antibiotic, you shouldn't have any nausea, vomiting, or diarrhea because it won't interact with your gut.  This earache should not limit your ability to get the 2nd dose of your COVID vaccine.  Try also using and antihistamine such as Claritin or Zyrtec.  In certain cases swimmer's ear may progress to a more serious bacterial infection of the middle or inner ear.  If you have a fever 102 and up and significantly worsening symptoms, this could indicate a more serious infection moving to the middle/inner and needs face to face evaluation in an office by a provider.  Your symptoms should improve over the next 3 days and should resolve in about 7 days.  HOME CARE:   Wash your hands frequently.  Do not place the tip of the bottle on your ear or touch it with your fingers.  You can take Acetominophen 650 mg every 4-6 hours as needed for pain.  If pain is severe or moderate, you can apply a heating pad (set on low) or hot water bottle (wrapped in a towel) to outer ear for 20 minutes.  This will also increase drainage.  Avoid ear plugs  Do not use Q-tips  After showers, help the water run out by tilting your head to one side.  GET HELP RIGHT AWAY IF:   Fever is over 102.2 degrees.  You develop progressive ear pain or hearing loss.  Ear symptoms persist longer than 3 days after treatment.  MAKE SURE YOU:   Understand these instructions.  Will watch your condition.  Will get help right away if you are not doing well or get worse.  TO PREVENT SWIMMER'S EAR:  Use a bathing cap or custom fitted swim molds to keep your ears dry.  Towel off after swimming to dry your ears.  Tilt your head or pull your earlobes to allow the water to escape your ear canal.  If there is still water in your ears, consider using a  hairdryer on the lowest setting.  Thank you for choosing an e-visit. Your e-visit answers were reviewed by a board certified advanced clinical practitioner to complete your personal care plan. Depending upon the condition, your plan could have included both over the counter or prescription medications. Please review your pharmacy choice. Be sure that the pharmacy you have chosen is open so that you can pick up your prescription now.  If there is a problem you may message your provider in [place] to have the prescription routed to another pharmacy. Your safety is important to [place]. If you have drug allergies check your prescription carefully.  For the next 24 hours, you can use MyChart to ask questions about today's visit, request a non-urgent call back, or ask for a work or school excuse from your e-visit provider. You will get an email in the next two days asking about your experience. I hope that your e-visit has been valuable and will speed your recovery.      Approximately 5 minutes was used in reviewing the patient's chart, questionnaire, prescribing medications, and documentation.

## 2019-05-17 ENCOUNTER — Ambulatory Visit (INDEPENDENT_AMBULATORY_CARE_PROVIDER_SITE_OTHER): Payer: Medicare Other | Admitting: *Deleted

## 2019-05-17 ENCOUNTER — Other Ambulatory Visit: Payer: Self-pay

## 2019-05-17 DIAGNOSIS — Z7901 Long term (current) use of anticoagulants: Secondary | ICD-10-CM

## 2019-05-17 LAB — POCT INR: INR: 3.1 — AB (ref 2.0–3.0)

## 2019-05-18 ENCOUNTER — Ambulatory Visit: Payer: Medicare Other | Attending: Internal Medicine

## 2019-05-18 DIAGNOSIS — Z23 Encounter for immunization: Secondary | ICD-10-CM

## 2019-05-18 NOTE — Progress Notes (Signed)
   Covid-19 Vaccination Clinic  Name:  [name]    MRN: [id number] DOB: Nov 09, 1950  05/18/2019  [name] was observed post Covid-19 immunization for 15 minutes without incident. She was provided with Vaccine Information Sheet and instruction to access the V-Safe system.   [name] was instructed to call 911 with any severe reactions post vaccine: Marland Kitchen Difficulty breathing  . Swelling of face and throat  . A fast heartbeat  . A bad rash all over body  . Dizziness and weakness   Immunizations Administered    Name Date Dose VIS Date Route   Pfizer COVID-19 Vaccine 05/18/2019  4:03 PM 0.3 mL 01/29/2019 Intramuscular   Manufacturer: [place]   Lot: [id number]   [place]: [id number]

## 2019-05-27 ENCOUNTER — Other Ambulatory Visit: Payer: Self-pay

## 2019-05-27 ENCOUNTER — Ambulatory Visit (INDEPENDENT_AMBULATORY_CARE_PROVIDER_SITE_OTHER): Payer: Medicare Other | Admitting: *Deleted

## 2019-05-27 DIAGNOSIS — Z7901 Long term (current) use of anticoagulants: Secondary | ICD-10-CM

## 2019-05-27 LAB — POCT INR: INR: 2.4 (ref 2.0–3.0)

## 2019-06-09 ENCOUNTER — Other Ambulatory Visit: Payer: Self-pay

## 2019-06-09 ENCOUNTER — Ambulatory Visit (INDEPENDENT_AMBULATORY_CARE_PROVIDER_SITE_OTHER): Payer: Medicare Other | Admitting: *Deleted

## 2019-06-09 DIAGNOSIS — K52831 Collagenous colitis: Secondary | ICD-10-CM | POA: Diagnosis not present

## 2019-06-09 DIAGNOSIS — K219 Gastro-esophageal reflux disease without esophagitis: Secondary | ICD-10-CM | POA: Diagnosis not present

## 2019-06-09 DIAGNOSIS — Z7901 Long term (current) use of anticoagulants: Secondary | ICD-10-CM | POA: Diagnosis not present

## 2019-06-09 DIAGNOSIS — K589 Irritable bowel syndrome without diarrhea: Secondary | ICD-10-CM | POA: Diagnosis not present

## 2019-06-09 LAB — POCT INR: INR: 2 (ref 2.0–3.0)

## 2019-06-10 ENCOUNTER — Ambulatory Visit: Payer: Medicare Other

## 2019-06-23 ENCOUNTER — Other Ambulatory Visit: Payer: Self-pay

## 2019-06-23 ENCOUNTER — Ambulatory Visit (INDEPENDENT_AMBULATORY_CARE_PROVIDER_SITE_OTHER): Payer: Medicare Other | Admitting: *Deleted

## 2019-06-23 DIAGNOSIS — Z7901 Long term (current) use of anticoagulants: Secondary | ICD-10-CM

## 2019-06-23 LAB — POCT INR: INR: 2.4 (ref 2.0–3.0)

## 2019-06-30 ENCOUNTER — Other Ambulatory Visit: Payer: Self-pay | Admitting: Family Medicine

## 2019-06-30 DIAGNOSIS — Z1231 Encounter for screening mammogram for malignant neoplasm of breast: Secondary | ICD-10-CM

## 2019-07-02 ENCOUNTER — Ambulatory Visit (INDEPENDENT_AMBULATORY_CARE_PROVIDER_SITE_OTHER): Payer: Medicare Other | Admitting: Student in an Organized Health Care Education/Training Program

## 2019-07-02 ENCOUNTER — Ambulatory Visit
Admission: RE | Admit: 2019-07-02 | Discharge: 2019-07-02 | Disposition: A | Discharge status: Home / Self Care | Payer: Medicare Other | Source: Ambulatory Visit | Attending: Family Medicine | Admitting: Family Medicine

## 2019-07-02 ENCOUNTER — Other Ambulatory Visit: Payer: Self-pay

## 2019-07-02 VITALS — BP 110/80 | HR 86 | Ht 58.5 in | Wt 181.2 lb

## 2019-07-02 DIAGNOSIS — R222 Localized swelling, mass and lump, trunk: Secondary | ICD-10-CM | POA: Diagnosis not present

## 2019-07-02 DIAGNOSIS — Z1231 Encounter for screening mammogram for malignant neoplasm of breast: Secondary | ICD-10-CM | POA: Diagnosis not present

## 2019-07-02 NOTE — Patient Instructions (Signed)
It was a pleasure to see you today!  To summarize our discussion for this visit:  The bump on your side feels like a fatty growth. We will get an ultrasound to confirm. Please continue to monitor it and see how it changes. If you have any new problems with it, please let [name] know sooner.   Some additional health maintenance measures we should update are: Health Maintenance Due  Topic Date Due  . Hepatitis C Screening  Never done  . DEXA SCAN  Never done  . PNA vac Low Risk Adult (2 of 2 - PPSV23) 10/29/2018  .    Please return to our clinic to see me as needed.  Call the clinic at [phone] if your symptoms worsen or you have any concerns.   Thank you for allowing me to take part in your care,  Dr. Doristine Mango   Lipoma  A lipoma is a noncancerous (benign) tumor that is made up of fat cells. This is a very common type of soft-tissue growth. Lipomas are usually found under the skin (subcutaneous). They may occur in any tissue of the body that contains fat. Common areas for lipomas to appear include the back, arms, shoulders, buttocks, and thighs. Lipomas grow slowly, and they are usually painless. Most lipomas do not cause problems and do not require treatment. What are the causes? The cause of this condition is not known. What increases the risk? You are more likely to develop this condition if:  You are 56-25 years old.  You have a family history of lipomas. What are the signs or symptoms? A lipoma usually appears as a small, round bump under the skin. In most cases, the lump will:  Feel soft or rubbery.  Not cause pain or other symptoms. However, if a lipoma is located in an area where it pushes on nerves, it can become painful or cause other symptoms. How is this diagnosed? A lipoma can usually be diagnosed with a physical exam. You may also have tests to confirm the diagnosis and to rule out other conditions. Tests may include:  Imaging tests, such as a CT scan  or an MRI.  Removal of a tissue sample to be looked at under a microscope (biopsy). How is this treated? Treatment for this condition depends on the size of the lipoma and whether it is causing any symptoms.  For small lipomas that are not causing problems, no treatment is needed.  If a lipoma is bigger or it causes problems, surgery may be done to remove the lipoma. Lipomas can also be removed to improve appearance. Most often, the procedure is done after applying a medicine that numbs the area (local anesthetic).  Liposuction may be done to reduce the size of the lipoma before it is removed through surgery, or it may be done to remove the lipoma. Lipomas are removed with this method in order to limit incision size and scarring. A liposuction tube is inserted through a small incision into the lipoma, and the contents of the lipoma are removed through the tube with suction. Follow these instructions at home:  Watch your lipoma for any changes.  Keep all follow-up visits as told by your health care provider. This is important. Contact a health care provider if:  Your lipoma becomes larger or hard.  Your lipoma becomes painful, red, or increasingly swollen. These could be signs of infection or a more serious condition. Get help right away if:  You develop tingling or numbness in  an area near the lipoma. This could indicate that the lipoma is causing nerve damage. Summary  A lipoma is a noncancerous tumor that is made up of fat cells.  Most lipomas do not cause problems and do not require treatment.  If a lipoma is bigger or it causes problems, surgery may be done to remove the lipoma.  Contact a health care provider if your lipoma becomes larger or hard, or if it becomes painful, red, or increasingly swollen. Pain, redness, and swelling could be signs of infection or a more serious condition. This information is not intended to replace advice given to you by your health care provider.  Make sure you discuss any questions you have with your health care provider. Document Revised: 09/21/2018 Document Reviewed: 09/21/2018 Elsevier Patient Education  [place].

## 2019-07-02 NOTE — Progress Notes (Signed)
   SUBJECTIVE:   CHIEF COMPLAINT / HPI: lump on side  Patient has had weight changes over the past year to include recent weight gain. 2 days ago, she noticed a lump on her right flank while showering that is soft and non-painful.  She now notices it every time she palpates her flanks.  Noticed also a similar growth on the left side which is much smaller. The mass has not changed in shape or size since first noticing. Feels a fullness there when she moves her arm over the mass.   PERTINENT  PMH / PSH: None  OBJECTIVE:   BP 110/80   Pulse 86   Ht 4' 10.5" (1.486 m)   Wt 181 lb 3.2 oz (82.2 kg)   SpO2 98%   BMI 37.23 kg/m   General: NAD, pleasant, able to participate in exam Abdomen: soft, nondistended, no hepatic or splenomegaly, +BS. Several Surgical scars healed. Mid-clavicular line around level of T12 on right flank has non-discrete soft and mobile mass that feels similar to the consistency of the adipose deposits elsewhere on abdomen. Approximately 7cm x4cm. Mildly tender to deep palpation. Not able to palpate on left side. No hard deposits appreciated.  Extremities: no edema or cyanosis. WWP. Skin: warm and dry, no rashes noted Neuro: alert and oriented x4, no focal deficits Psych: Normal affect and mood  ASSESSMENT/PLAN:   Soft tissue swelling of chest wall First noticed a couple days ago.  Have low suspicion for malignancy based on exam.  Will rule out lipoma with [place].  Encouraged patient to continue to monitor for changes     [place]

## 2019-07-02 NOTE — Assessment & Plan Note (Signed)
First noticed a couple days ago.  Have low suspicion for malignancy based on exam.  Will rule out lipoma with [place].  Encouraged patient to continue to monitor for changes

## 2019-07-08 ENCOUNTER — Other Ambulatory Visit: Payer: Self-pay | Admitting: Family Medicine

## 2019-07-08 ENCOUNTER — Other Ambulatory Visit: Payer: Self-pay

## 2019-07-08 ENCOUNTER — Ambulatory Visit (HOSPITAL_COMMUNITY)
Admission: RE | Admit: 2019-07-08 | Discharge: 2019-07-08 | Disposition: A | Discharge status: Home / Self Care | Payer: Medicare Other | Source: Ambulatory Visit | Attending: Family Medicine | Admitting: Family Medicine

## 2019-07-08 DIAGNOSIS — R222 Localized swelling, mass and lump, trunk: Secondary | ICD-10-CM | POA: Insufficient documentation

## 2019-07-08 DIAGNOSIS — R1900 Intra-abdominal and pelvic swelling, mass and lump, unspecified site: Secondary | ICD-10-CM | POA: Diagnosis not present

## 2019-07-11 ENCOUNTER — Other Ambulatory Visit: Payer: Self-pay | Admitting: Family Medicine

## 2019-07-13 ENCOUNTER — Telehealth: Payer: Self-pay | Admitting: *Deleted

## 2019-07-13 NOTE — Telephone Encounter (Signed)
Received fax from [facility] requesting clarification on Fluconazole Rx. Fax message is below.  I placed the fax in your box in case you needed it for reference. Ksenia Kunz Zimmerman Rumple, CMA   Please clarify the DIRECTIONS for the prescription(s) for FLUCONAZOLE TAB 100 MG. Is retitration needed last filled 03/05/19 13 tab? Thank you.

## 2019-07-21 ENCOUNTER — Ambulatory Visit: Payer: Medicare Other

## 2019-07-21 MED ORDER — FLUCONAZOLE 100 MG PO TABS: ORAL_TABLET | ORAL | 3 refills | Status: DC

## 2019-07-23 ENCOUNTER — Other Ambulatory Visit: Payer: Self-pay

## 2019-07-23 ENCOUNTER — Ambulatory Visit (INDEPENDENT_AMBULATORY_CARE_PROVIDER_SITE_OTHER): Payer: Medicare Other | Admitting: *Deleted

## 2019-07-23 DIAGNOSIS — Z7901 Long term (current) use of anticoagulants: Secondary | ICD-10-CM

## 2019-07-23 DIAGNOSIS — I2699 Other pulmonary embolism without acute cor pulmonale: Secondary | ICD-10-CM | POA: Diagnosis not present

## 2019-07-23 LAB — POCT INR: INR: 2.6 (ref 2.0–3.0)

## 2019-08-02 ENCOUNTER — Other Ambulatory Visit: Payer: Self-pay

## 2019-08-03 MED ORDER — WARFARIN SODIUM 2 MG PO TABS: ORAL_TABLET | ORAL | 3 refills | Status: DC

## 2019-08-18 ENCOUNTER — Ambulatory Visit (INDEPENDENT_AMBULATORY_CARE_PROVIDER_SITE_OTHER): Payer: Medicare Other | Admitting: Family Medicine

## 2019-08-18 ENCOUNTER — Encounter: Payer: Self-pay | Admitting: Family Medicine

## 2019-08-18 ENCOUNTER — Other Ambulatory Visit: Payer: Self-pay

## 2019-08-18 VITALS — BP 112/58 | HR 75 | Ht 59.0 in | Wt 184.2 lb

## 2019-08-18 DIAGNOSIS — F341 Dysthymic disorder: Secondary | ICD-10-CM

## 2019-08-18 DIAGNOSIS — I10 Essential (primary) hypertension: Secondary | ICD-10-CM | POA: Diagnosis not present

## 2019-08-18 DIAGNOSIS — Z23 Encounter for immunization: Secondary | ICD-10-CM

## 2019-08-18 DIAGNOSIS — Z13820 Encounter for screening for osteoporosis: Secondary | ICD-10-CM

## 2019-08-18 DIAGNOSIS — L4050 Arthropathic psoriasis, unspecified: Secondary | ICD-10-CM

## 2019-08-18 DIAGNOSIS — Z0000 Encounter for general adult medical examination without abnormal findings: Secondary | ICD-10-CM

## 2019-08-18 DIAGNOSIS — I2699 Other pulmonary embolism without acute cor pulmonale: Secondary | ICD-10-CM | POA: Diagnosis not present

## 2019-08-18 DIAGNOSIS — Z7901 Long term (current) use of anticoagulants: Secondary | ICD-10-CM | POA: Diagnosis not present

## 2019-08-18 LAB — POCT INR: INR: 2 (ref 2.0–3.0)

## 2019-08-18 MED ORDER — BETAMETHASONE DIPROPIONATE AUG 0.05 % EX OINT: TOPICAL_OINTMENT | CUTANEOUS | 3 refills | Status: DC

## 2019-08-18 MED ORDER — FUROSEMIDE 20 MG PO TABS: ORAL_TABLET | ORAL | 3 refills | Status: DC

## 2019-08-18 NOTE — Patient Instructions (Signed)
Increase lasix adding a prn dose in afternoon in addition to regular dose of 40 mg in AM. Dexa scan scheduled.  Pneumovax today. Ok to increase voltaren and we will plan on checking you kidney function in 3 months Blood work today

## 2019-08-19 LAB — COMPREHENSIVE METABOLIC PANEL
ALT: 23 IU/L (ref 0–32)
AST: 21 IU/L (ref 0–40)
Albumin/Globulin Ratio: 1.6 (ref 1.2–2.2)
Albumin: 3.9 g/dL (ref 3.8–4.8)
Alkaline Phosphatase: 81 IU/L (ref 48–121)
BUN/Creatinine Ratio: 26 (ref 12–28)
BUN: 15 mg/dL (ref 8–27)
Bilirubin Total: 0.3 mg/dL (ref 0.0–1.2)
CO2: 25 mmol/L (ref 20–29)
Calcium: 9.1 mg/dL (ref 8.7–10.3)
Chloride: 101 mmol/L (ref 96–106)
Creatinine, Ser: 0.58 mg/dL (ref 0.57–1.00)
GFR calc Af Amer: 110 mL/min/{1.73_m2} (ref >59)
GFR calc non Af Amer: 96 mL/min/{1.73_m2} (ref >59)
Globulin, Total: 2.5 g/dL (ref 1.5–4.5)
Glucose: 94 mg/dL (ref 65–99)
Potassium: 4.6 mmol/L (ref 3.5–5.2)
Sodium: 139 mmol/L (ref 134–144)
Total Protein: 6.4 g/dL (ref 6.0–8.5)

## 2019-08-19 LAB — LIPID PANEL
Chol/HDL Ratio: 3.6 ratio (ref 0.0–4.4)
Cholesterol, Total: 240 mg/dL — ABNORMAL HIGH (ref 100–199)
HDL: 67 mg/dL (ref >39)
LDL Chol Calc (NIH): 150 mg/dL — ABNORMAL HIGH (ref 0–99)
Triglycerides: 131 mg/dL (ref 0–149)
VLDL Cholesterol Cal: 23 mg/dL (ref 5–40)

## 2019-08-21 NOTE — Assessment & Plan Note (Signed)
Increase lasix to 40 AM and 20 pm. The pM dose can also be prn so she can adjust as needed depending one her lower extremity edema Labs today

## 2019-08-21 NOTE — Assessment & Plan Note (Signed)
Will Ok increase back to original dose voltaren but will now do q 3 month creatinine checks. Reviewed red flags specifically GI

## 2019-08-21 NOTE — Assessment & Plan Note (Signed)
She reports she has had Hepatitis C screen and was negative Wants to do bone density Ok to start pneumovax series 2 Encouraged continued activity

## 2019-08-21 NOTE — Assessment & Plan Note (Signed)
Stable and will continue current medications Does not seem like this is a significant component of her fatigue so will check some labs

## 2019-08-21 NOTE — Progress Notes (Signed)
    CHIEF COMPLAINT / HPI:  CPE and check up 1. [name] having some lower extreit edema at current dose of 20 bid 2.wants to restart oral voltaren for joint pains---not able to do as much without it as she hurts all of the time, diffuse joint pains, esp knees, hips and back. 3. Seeing a general sureg]]geon about a fatty mass on right  Abdominal area 4. Fatigue--cannot do as much as she wants to before she stops and rests. Does get a "second wind" but not satisfied. No DOE.    PERTINENT  PMH / PSH: I have reviewed the patient's medications, allergies, past medical and surgical history, smoking status and updated in the EMR as appropriate.  psoriatric arthritis S/p Left TKR 2 3 revisions Continues on  warfarin  OBJECTIVE:  BP (!) 112/58   Pulse 75   Ht 4\' 11"  (1.499 m)   Wt 184 lb 3.2 oz (83.6 kg)   SpO2 97%   BMI 37.20 kg/m  Vital signs reviewed. GENERAL: Well-developed, well-nourished, no acute distress. CARDIOVASCULAR: Regular rate and rhythm no murmur gallop or rub LUNGS: Clear to auscultation bilaterally, no rales or wheeze. ABDOMEN: Soft positive bowel sounds. Fatty, mostly mobile soft nontender mass in right abdominal wall. NEURO: No gross focal neurological deficits. MSK: Movement of extremity x 4. PSYCH: AxOx4. Good eye contact.. No psychomotor retardation or agitation. Appropriate speech fluency and content. Asks and answers questions appropriately. Mood is congruent. SKIN: no worrisome lesions on face, ears, arms     ASSESSMENT / PLAN:   Well adult health check She reports she has had Hepatitis C screen and was negative Wants to do bone density Ok to start pneumovax series 2 Encouraged continued activity   Dysthymia Stable and will continue current medications Does not seem like this is a significant component of her fatigue so will check some labs  Essential hypertension Increase lasix to 40 AM and 20 pm. The pM dose can also be prn so she can adjust as  needed depending one her lower extremity edema Labs today  Psoriatic arthritis Will Ok increase back to original dose voltaren but will now do q 3 month creatinine checks. Reviewed red flags specifically GI   Jacqueline Mcmurray MD

## 2019-08-24 ENCOUNTER — Ambulatory Visit: Payer: Medicare Other

## 2019-08-27 ENCOUNTER — Encounter: Payer: Self-pay | Admitting: Family Medicine

## 2019-08-27 NOTE — Progress Notes (Signed)
Kidney function stable. Cholesterol looks good. Everything else OK!

## 2019-09-02 DIAGNOSIS — D171 Benign lipomatous neoplasm of skin and subcutaneous tissue of trunk: Secondary | ICD-10-CM | POA: Diagnosis not present

## 2019-09-17 ENCOUNTER — Other Ambulatory Visit: Payer: Self-pay

## 2019-09-17 ENCOUNTER — Ambulatory Visit (INDEPENDENT_AMBULATORY_CARE_PROVIDER_SITE_OTHER): Payer: Medicare Other | Admitting: *Deleted

## 2019-09-17 DIAGNOSIS — Z7901 Long term (current) use of anticoagulants: Secondary | ICD-10-CM

## 2019-09-17 DIAGNOSIS — I2699 Other pulmonary embolism without acute cor pulmonale: Secondary | ICD-10-CM | POA: Diagnosis not present

## 2019-09-17 LAB — POCT INR: INR: 3.1 — AB (ref 2.0–3.0)

## 2019-09-30 ENCOUNTER — Other Ambulatory Visit: Payer: Self-pay

## 2019-09-30 ENCOUNTER — Ambulatory Visit (INDEPENDENT_AMBULATORY_CARE_PROVIDER_SITE_OTHER): Payer: Medicare Other | Admitting: *Deleted

## 2019-09-30 DIAGNOSIS — I2699 Other pulmonary embolism without acute cor pulmonale: Secondary | ICD-10-CM | POA: Diagnosis not present

## 2019-09-30 DIAGNOSIS — Z7901 Long term (current) use of anticoagulants: Secondary | ICD-10-CM

## 2019-09-30 LAB — POCT INR: INR: 3.4 — AB (ref 2.0–3.0)

## 2019-10-01 ENCOUNTER — Other Ambulatory Visit: Payer: Medicare Other

## 2019-10-06 IMAGING — MG DIGITAL DIAGNOSTIC BILATERAL MAMMOGRAM WITH TOMO AND CAD
6 of 10 series · 6 of 30 positions shown · non-contrast
Comparison: Previous exam(s).

CLINICAL DATA: 68-year-old female presenting for evaluation of a
palpable lump in the left breast identified about 6 days ago.The
patient is on blood thinners and had about 4 weeks ago she had a
large bruise in the upper left breast in the region of the palpable
lump.

EXAM:
DIGITAL DIAGNOSTIC BILATERAL MAMMOGRAM WITH CAD AND TOMO
ULTRASOUND LEFT BREAST

[R CC synth-2D]
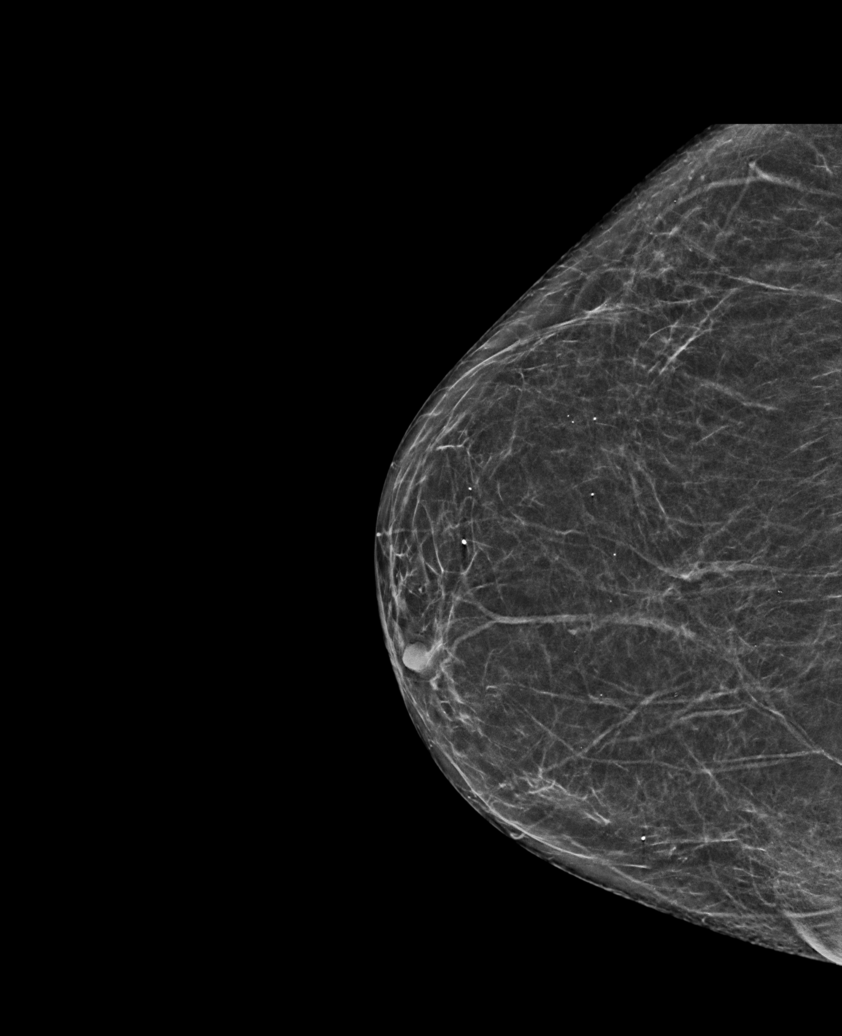

[L CC synth-2D]
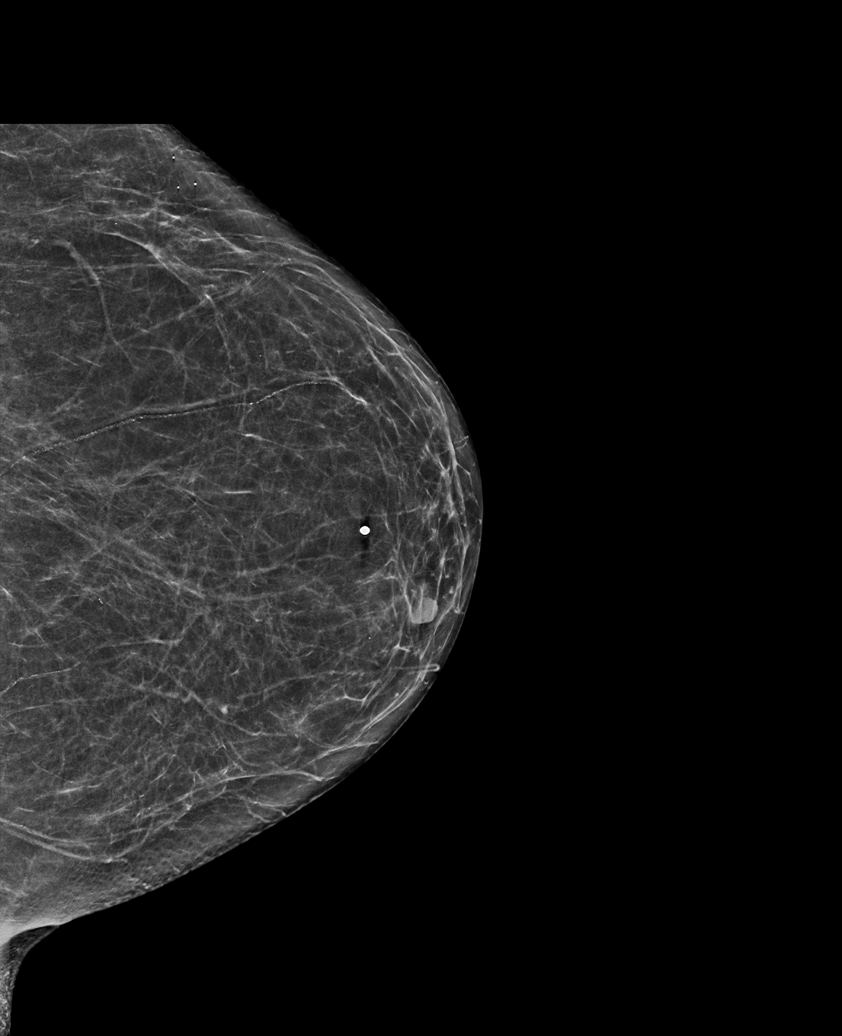

[L TAN synth-2D]
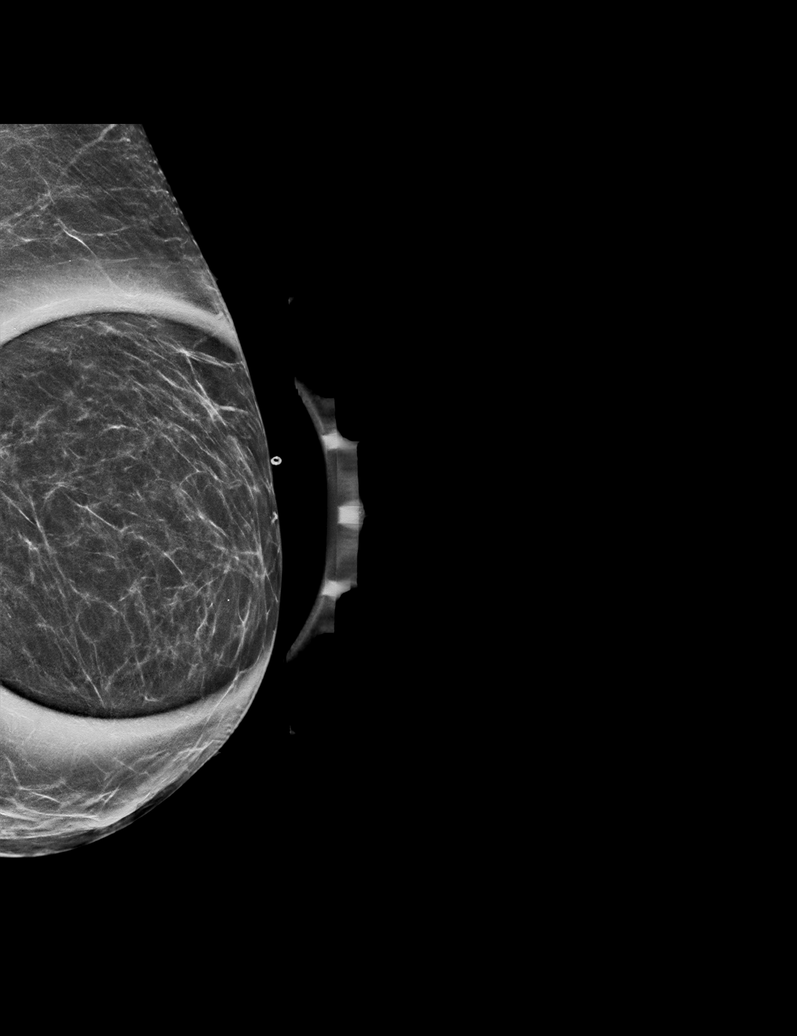

[R MLO synth-2D]
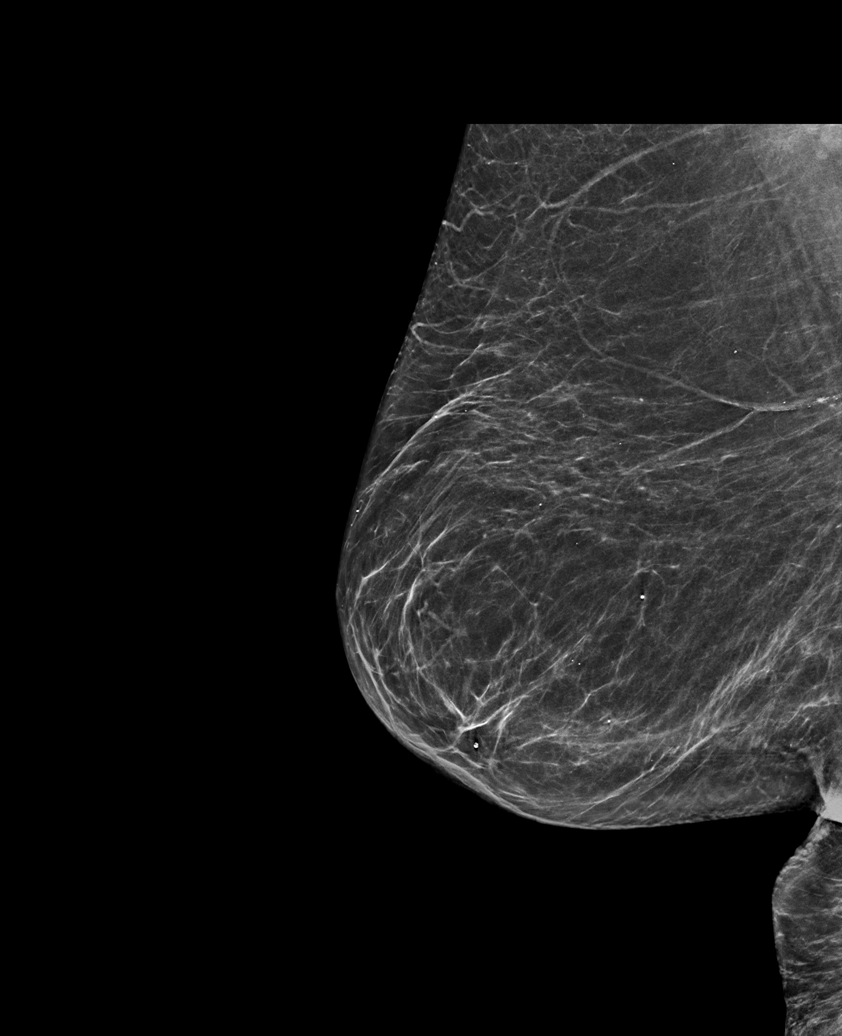

[L MLO synth-2D]
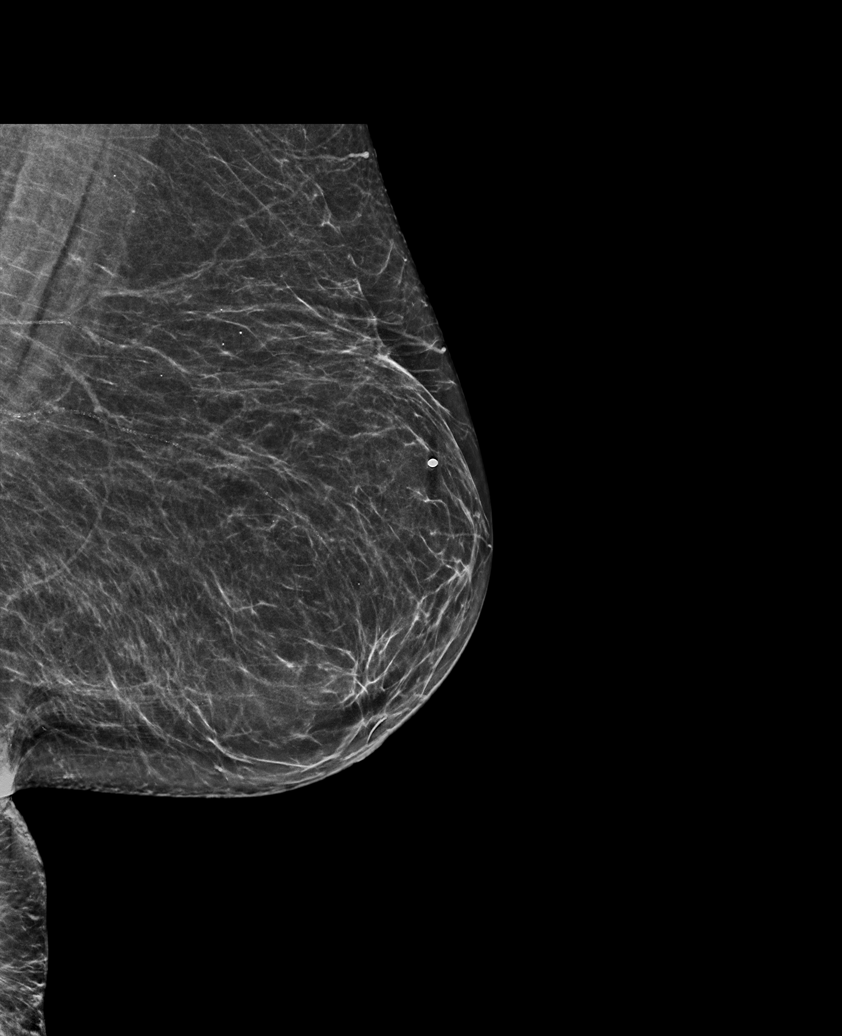

[R MLO tomo · tomo slice 35/68.0]
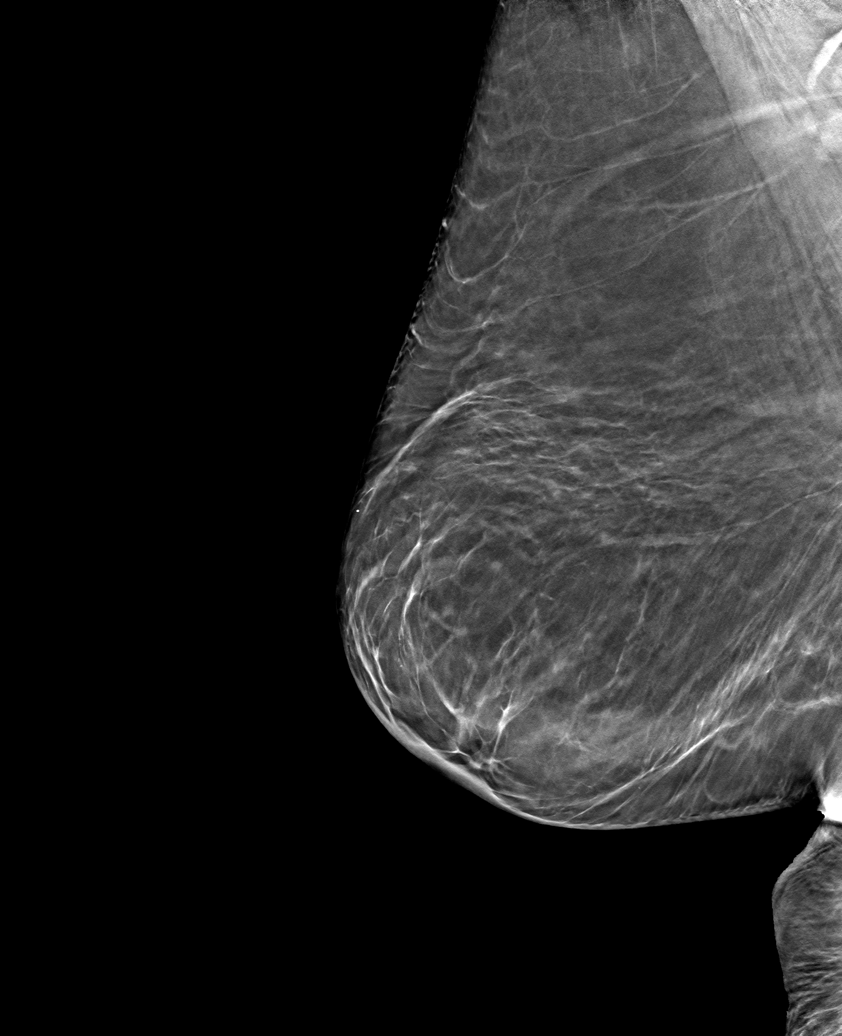

[6 of 30 positions shown; findings below may reference images not displayed]

ACR Breast Density Category b: There are scattered areas of
fibroglandular density.
FINDINGS: A BB has been placed at the palpable site on the superior aspect of
the left breast, anterior depth. No suspicious masses are seen
mammographically deep to the palpable marker. No suspicious
calcifications, masses or areas of distortion are seen in the
bilateral breasts.

Mammographic images were processed with CAD.

Physical exam of the palpable site in the superior left breast
demonstrates a superficial mobile smooth palpable lump.

Ultrasound targeted to the palpable site demonstrates a
circumscribed isoechoic oval mass immediately deep to the skin
measuring 1.6 x 0.4 x 0.5 cm.
IMPRESSION: 1. The palpable lump in the superior left breast corresponds with a
benign lipoma.

2.  No mammographic evidence of malignancy in the bilateral breasts.

RECOMMENDATION:
Screening mammogram in one year.(Code:[id number]-G-3M4)

I have discussed the findings and recommendations with the patient.
Results were also provided in writing at the conclusion of the
visit. If applicable, a reminder letter will be sent to the patient
regarding the next appointment.

BI-RADS CATEGORY  2: Benign.

## 2019-10-11 ENCOUNTER — Other Ambulatory Visit: Payer: Self-pay

## 2019-10-11 ENCOUNTER — Ambulatory Visit (INDEPENDENT_AMBULATORY_CARE_PROVIDER_SITE_OTHER): Payer: Medicare Other | Admitting: *Deleted

## 2019-10-11 DIAGNOSIS — Z7901 Long term (current) use of anticoagulants: Secondary | ICD-10-CM | POA: Diagnosis not present

## 2019-10-11 DIAGNOSIS — I2699 Other pulmonary embolism without acute cor pulmonale: Secondary | ICD-10-CM

## 2019-10-11 LAB — POCT INR: INR: 2.1 (ref 2.0–3.0)

## 2019-10-28 ENCOUNTER — Other Ambulatory Visit: Payer: Self-pay

## 2019-10-28 ENCOUNTER — Ambulatory Visit (INDEPENDENT_AMBULATORY_CARE_PROVIDER_SITE_OTHER): Payer: Medicare Other | Admitting: *Deleted

## 2019-10-28 DIAGNOSIS — I2699 Other pulmonary embolism without acute cor pulmonale: Secondary | ICD-10-CM

## 2019-10-28 DIAGNOSIS — Z7901 Long term (current) use of anticoagulants: Secondary | ICD-10-CM | POA: Diagnosis not present

## 2019-10-28 LAB — POCT INR: INR: 2.6 (ref 2.0–3.0)

## 2019-11-10 ENCOUNTER — Ambulatory Visit (INDEPENDENT_AMBULATORY_CARE_PROVIDER_SITE_OTHER): Payer: Medicare Other | Admitting: *Deleted

## 2019-11-10 ENCOUNTER — Other Ambulatory Visit: Payer: Self-pay

## 2019-11-10 DIAGNOSIS — Z7901 Long term (current) use of anticoagulants: Secondary | ICD-10-CM | POA: Diagnosis not present

## 2019-11-10 DIAGNOSIS — I2699 Other pulmonary embolism without acute cor pulmonale: Secondary | ICD-10-CM | POA: Diagnosis not present

## 2019-11-10 LAB — POCT INR: INR: 2.2 (ref 2.0–3.0)

## 2019-11-11 ENCOUNTER — Ambulatory Visit
Admission: RE | Admit: 2019-11-11 | Discharge: 2019-11-11 | Disposition: A | Discharge status: Home / Self Care | Payer: Medicare Other | Source: Ambulatory Visit | Attending: Family Medicine | Admitting: Family Medicine

## 2019-11-11 DIAGNOSIS — M810 Age-related osteoporosis without current pathological fracture: Secondary | ICD-10-CM | POA: Diagnosis not present

## 2019-11-11 DIAGNOSIS — Z780 Asymptomatic menopausal state: Secondary | ICD-10-CM | POA: Diagnosis not present

## 2019-11-11 DIAGNOSIS — M8588 Other specified disorders of bone density and structure, other site: Secondary | ICD-10-CM | POA: Diagnosis not present

## 2019-11-11 DIAGNOSIS — Z13820 Encounter for screening for osteoporosis: Secondary | ICD-10-CM

## 2019-12-08 ENCOUNTER — Ambulatory Visit (INDEPENDENT_AMBULATORY_CARE_PROVIDER_SITE_OTHER): Payer: Medicare Other | Admitting: *Deleted

## 2019-12-08 ENCOUNTER — Other Ambulatory Visit: Payer: Self-pay

## 2019-12-08 DIAGNOSIS — Z7901 Long term (current) use of anticoagulants: Secondary | ICD-10-CM

## 2019-12-08 DIAGNOSIS — I2699 Other pulmonary embolism without acute cor pulmonale: Secondary | ICD-10-CM

## 2019-12-08 LAB — POCT INR: INR: 2.4 (ref 2.0–3.0)

## 2019-12-16 ENCOUNTER — Other Ambulatory Visit: Payer: Self-pay

## 2019-12-16 ENCOUNTER — Ambulatory Visit (INDEPENDENT_AMBULATORY_CARE_PROVIDER_SITE_OTHER): Payer: Medicare Other | Admitting: Family Medicine

## 2019-12-16 VITALS — BP 110/80 | HR 85 | Ht 58.5 in | Wt 184.4 lb

## 2019-12-16 DIAGNOSIS — H6690 Otitis media, unspecified, unspecified ear: Secondary | ICD-10-CM

## 2019-12-16 MED ORDER — DOXYCYCLINE HYCLATE 100 MG PO TABS: 100.0000 mg | ORAL_TABLET | Freq: Two times a day (BID) | ORAL | 0 refills | Status: AC

## 2019-12-16 NOTE — Assessment & Plan Note (Signed)
We will go ahead and treat her with doxycycline given her multiple allergies for acute otitis media, would also cover for a sinus infection given that she has tenderness and fullness when she leans forward.  She does not seem to have obvious signs of mastoiditis on examination.  Advised that if she does not have improvement after antibiotics, would recommend follow-up ear, then would likely need referral to ENT.  Given that doxycycline can affect Warfarin, will have her scheduled for an INR check early next week.

## 2019-12-16 NOTE — Progress Notes (Signed)
    SUBJECTIVE:   CHIEF COMPLAINT / HPI:   Right ear pain Patient reports that about 6 weeks ago she got new hearing aids She was having some discomfort a few weeks ago in her right ear, therefore she went back and had the inner part of her hearing aid changed out to a smaller plastic piece She states that that helped a little bit, but then about a week ago noticed that her pain in her right ear was worsening Also reports congestion in her head and feeling that her sinuses and ear hurt when she leans forward Reports some clear rhinorrhea as well No cough or sore throat No difficulty breathing No true fevers No known COVID-19 contacts She does not swim, has not noticed any drainage from the area She has some soreness behind her ear and in front of her ear, does not notice any redness in the area, has improved today since she took pain medication earlier  PERTINENT  PMH / PSH: HTN, history of PE on warfarin, GERD, obesity, history of panic attacks  OBJECTIVE:   BP 110/80   Pulse 85   Ht 4' 10.5" (1.486 m)   Wt 184 lb 6.4 oz (83.6 kg)   SpO2 97%   BMI 37.88 kg/m    Physical Exam:  General: 69 y.o. female in NAD HEENT: NCAT, right TM erythematous, scarred, mild bulging, unable to evaluate effusion due to scarring, no erythema of canal, clear TM on left, bilateral nares with crusting and dried blood, no obvious swelling or rhinorrhea, tenderness palpation of bilateral maxillary and frontal sinuses, no tenderness palpation of bilateral mastoids, no pain with movement of tragus bilaterally Neck: Supple, no thyromegaly or cervical lymphadenopathy palpated Lungs: Breathing comfortably on room air Skin: warm and dry Extremities: No edema   ASSESSMENT/PLAN:   Acute otitis media We will go ahead and treat her with doxycycline given her multiple allergies for acute otitis media, would also cover for a sinus infection given that she has tenderness and fullness when she leans forward.   She does not seem to have obvious signs of mastoiditis on examination.  Advised that if she does not have improvement after antibiotics, would recommend follow-up ear, then would likely need referral to ENT.  Given that doxycycline can affect Warfarin, will have her scheduled for an INR check early next week.     Cleophas Dunker, [place]

## 2019-12-16 NOTE — Patient Instructions (Signed)
Thank you for coming to see me today. It was a pleasure. Today we talked about:   We will treat you for a sinus infection and ear infection.  This antibiotic that we need to give you because of your allergies can enhance the anticoagulant effect of warfarin.  It is very important that you come in for your INR checks and watch for signs of bleeding while you are taking this.  If you do not improve over the next week, come back and you will likely need to see an ear nose and throat specialist.  If you have any questions or concerns, please do not hesitate to call the office at [phone].  Best,   [place] Constable, DO

## 2019-12-20 ENCOUNTER — Encounter: Payer: Self-pay | Admitting: Family Medicine

## 2019-12-20 ENCOUNTER — Other Ambulatory Visit: Payer: Self-pay

## 2019-12-20 ENCOUNTER — Ambulatory Visit (INDEPENDENT_AMBULATORY_CARE_PROVIDER_SITE_OTHER): Payer: Medicare Other | Admitting: *Deleted

## 2019-12-20 DIAGNOSIS — Z7901 Long term (current) use of anticoagulants: Secondary | ICD-10-CM | POA: Diagnosis not present

## 2019-12-20 DIAGNOSIS — I2699 Other pulmonary embolism without acute cor pulmonale: Secondary | ICD-10-CM | POA: Diagnosis not present

## 2019-12-20 LAB — POCT INR: INR: 3 (ref 2.0–3.0)

## 2019-12-21 MED ORDER — ALPRAZOLAM 1 MG PO TABS
ORAL_TABLET | ORAL | 5 refills | Status: DC
Start: 2019-12-21 — End: 2020-05-03

## 2020-01-05 ENCOUNTER — Other Ambulatory Visit: Payer: Self-pay

## 2020-01-05 ENCOUNTER — Ambulatory Visit (INDEPENDENT_AMBULATORY_CARE_PROVIDER_SITE_OTHER): Payer: Medicare Other | Admitting: *Deleted

## 2020-01-05 DIAGNOSIS — I2699 Other pulmonary embolism without acute cor pulmonale: Secondary | ICD-10-CM

## 2020-01-05 DIAGNOSIS — Z7901 Long term (current) use of anticoagulants: Secondary | ICD-10-CM

## 2020-01-05 LAB — POCT INR: INR: 3.3 — AB (ref 2.0–3.0)

## 2020-01-07 ENCOUNTER — Ambulatory Visit (INDEPENDENT_AMBULATORY_CARE_PROVIDER_SITE_OTHER): Payer: Medicare Other

## 2020-01-07 ENCOUNTER — Other Ambulatory Visit: Payer: Self-pay

## 2020-01-07 DIAGNOSIS — Z23 Encounter for immunization: Secondary | ICD-10-CM | POA: Diagnosis not present

## 2020-01-07 NOTE — Progress Notes (Signed)
   Covid-19 Vaccination Clinic  Name:  [name]    MRN: [id number] DOB: 10-23-1950  01/07/2020  [name] was observed post Covid-19 immunization for 15 minutes without incident. She was provided with Vaccine Information Sheet and instruction to access the V-Safe system.   [name] was instructed to call 911 with any severe reactions post vaccine: Marland Kitchen Difficulty breathing  . Swelling of face and throat  . A fast heartbeat  . A bad rash all over body  . Dizziness and weakness   Booster administered RD without complication.

## 2020-01-12 ENCOUNTER — Telehealth: Payer: Medicare Other | Admitting: Nurse Practitioner

## 2020-01-12 DIAGNOSIS — B9789 Other viral agents as the cause of diseases classified elsewhere: Secondary | ICD-10-CM | POA: Diagnosis not present

## 2020-01-12 DIAGNOSIS — J329 Chronic sinusitis, unspecified: Secondary | ICD-10-CM

## 2020-01-12 NOTE — Progress Notes (Signed)
We are sorry that you are not feeling well.  Here is how we plan to help!  Based on what you have shared with me it looks like you have sinusitis.  Sinusitis is inflammation and infection in the sinus cavities of the head.  Based on your presentation I believe you most likely have Acute Viral Sinusitis.This is an infection most likely caused by a virus. There is not specific treatment for viral sinusitis other than to help you with the symptoms until the infection runs its course.  You may use an oral decongestant such as Mucinex D or if you have glaucoma or high blood pressure use plain Mucinex. Saline nasal spray help and can safely be used as often as needed for congestion, I have prescribed: Fluticasone nasal spray two sprays in each nostril once a day   Providers prescribe antibiotics to treat infections caused by bacteria. Antibiotics are very powerful in treating bacterial infections when they are used properly. To maintain their effectiveness, they should be used only when necessary. Overuse of antibiotics has resulted in the development of superbugs that are resistant to treatment!    After careful review of your answers, I would not recommend an antibiotic for your condition.  Antibiotics are not effective against viruses and therefore should not be used to treat them. Common examples of infections caused by viruses include colds and flu    Some authorities believe that zinc sprays or the use of Echinacea may shorten the course of your symptoms.  Sinus infections are not as easily transmitted as other respiratory infection, however we still recommend that you avoid close contact with loved ones, especially the very young and elderly.  Remember to wash your hands thoroughly throughout the day as this is the number one way to prevent the spread of infection!  Home Care:  Only take medications as instructed by your medical team.  Do not take these medications with alcohol.  A steam or  ultrasonic humidifier can help congestion.  You can place a towel over your head and breathe in the steam from hot water coming from a faucet.  Avoid close contacts especially the very young and the elderly.  Cover your mouth when you cough or sneeze.  Always remember to wash your hands.  Get Help Right Away If:  You develop worsening fever or sinus pain.  You develop a severe head ache or visual changes.  Your symptoms persist after you have completed your treatment plan.  Make sure you  Understand these instructions.  Will watch your condition.  Will get help right away if you are not doing well or get worse.  Your e-visit answers were reviewed by a board certified advanced clinical practitioner to complete your personal care plan.  Depending on the condition, your plan could have included both over the counter or prescription medications.  If there is a problem please reply  once you have received a response from your provider.  Your safety is important to us.  If you have drug allergies check your prescription carefully.    You can use MyChart to ask questions about today's visit, request a non-urgent call back, or ask for a work or school excuse for 24 hours related to this e-Visit. If it has been greater than 24 hours you will need to follow up with your provider, or enter a new e-Visit to address those concerns.  You will get an e-mail in the next two days asking about your experience.  I   hope that your e-visit has been valuable and will speed your recovery. Thank you for using e-visits.   5-10 minutes spent reviewing and documenting in chart.  

## 2020-01-19 ENCOUNTER — Ambulatory Visit (INDEPENDENT_AMBULATORY_CARE_PROVIDER_SITE_OTHER): Payer: Medicare Other | Admitting: *Deleted

## 2020-01-19 ENCOUNTER — Other Ambulatory Visit: Payer: Self-pay

## 2020-01-19 DIAGNOSIS — Z7901 Long term (current) use of anticoagulants: Secondary | ICD-10-CM

## 2020-01-19 DIAGNOSIS — I2699 Other pulmonary embolism without acute cor pulmonale: Secondary | ICD-10-CM | POA: Diagnosis not present

## 2020-01-19 LAB — POCT INR: INR: 3.6 — AB (ref 2.0–3.0)

## 2020-01-28 MED ORDER — DICLOFENAC SODIUM 75 MG PO TBEC
DELAYED_RELEASE_TABLET | ORAL | 3 refills | Status: DC
Start: 2020-01-28 — End: 2020-11-15

## 2020-01-28 NOTE — Addendum Note (Signed)
Addended by: Dorna Bloom on: 01/28/2020 09:28 AM   Modules accepted: Orders

## 2020-02-01 ENCOUNTER — Other Ambulatory Visit: Payer: Self-pay

## 2020-02-01 ENCOUNTER — Ambulatory Visit (INDEPENDENT_AMBULATORY_CARE_PROVIDER_SITE_OTHER): Payer: Medicare Other | Admitting: *Deleted

## 2020-02-01 DIAGNOSIS — Z7901 Long term (current) use of anticoagulants: Secondary | ICD-10-CM | POA: Diagnosis not present

## 2020-02-01 DIAGNOSIS — I2699 Other pulmonary embolism without acute cor pulmonale: Secondary | ICD-10-CM

## 2020-02-01 LAB — POCT INR: INR: 2.1 (ref 2.0–3.0)

## 2020-02-16 ENCOUNTER — Ambulatory Visit (INDEPENDENT_AMBULATORY_CARE_PROVIDER_SITE_OTHER): Payer: Medicare Other | Admitting: *Deleted

## 2020-02-16 ENCOUNTER — Other Ambulatory Visit: Payer: Self-pay

## 2020-02-16 DIAGNOSIS — I2699 Other pulmonary embolism without acute cor pulmonale: Secondary | ICD-10-CM

## 2020-02-16 DIAGNOSIS — Z7901 Long term (current) use of anticoagulants: Secondary | ICD-10-CM

## 2020-02-16 LAB — POCT INR: INR: 2.6 (ref 2.0–3.0)

## 2020-02-18 ENCOUNTER — Other Ambulatory Visit: Payer: Self-pay | Admitting: Family Medicine

## 2020-03-01 ENCOUNTER — Ambulatory Visit: Payer: Medicare Other

## 2020-03-02 ENCOUNTER — Ambulatory Visit (INDEPENDENT_AMBULATORY_CARE_PROVIDER_SITE_OTHER): Payer: Medicare Other | Admitting: *Deleted

## 2020-03-02 ENCOUNTER — Telehealth: Payer: Self-pay

## 2020-03-02 ENCOUNTER — Other Ambulatory Visit: Payer: Self-pay

## 2020-03-02 DIAGNOSIS — I2699 Other pulmonary embolism without acute cor pulmonale: Secondary | ICD-10-CM | POA: Diagnosis not present

## 2020-03-02 DIAGNOSIS — Z7901 Long term (current) use of anticoagulants: Secondary | ICD-10-CM

## 2020-03-02 LAB — POCT INR: INR: 2.2 (ref 2.0–3.0)

## 2020-03-02 NOTE — Telephone Encounter (Signed)
Patient calls nurse line stating that she is having issues receiving diclofenac prescription. Patient reports talking with the pharmacist that states that the rx they have on file for her is for a quantity of 90 pills for 90 days. Patient reports that she is supposed to be taking 2 per day for a quantity of 180.  Called and spoke with pharmacist. Provided verbal order per Dr. Nori Riis for patient to receive rx for two tablets by mouth once daily as needed for joint pain. Quantity 180 with 3 refills.   Talbot Grumbling, RN

## 2020-03-14 ENCOUNTER — Telehealth: Payer: Self-pay

## 2020-03-14 NOTE — Telephone Encounter (Signed)
Spoke with patient concerning possible transition of warfarin to a DOAC. Patient on anticoagulation for history of PE. Previously tried Eliquis in 2019 but stopped due to high cost. Patient interested in discussing her options further and has scheduled an appointment with the pharmacy clinic for 03/23/20 @11 :15.

## 2020-03-23 ENCOUNTER — Other Ambulatory Visit: Payer: Self-pay

## 2020-03-23 ENCOUNTER — Encounter: Payer: Self-pay | Admitting: Pharmacist

## 2020-03-23 ENCOUNTER — Ambulatory Visit (INDEPENDENT_AMBULATORY_CARE_PROVIDER_SITE_OTHER): Payer: Medicare Other | Admitting: Pharmacist

## 2020-03-23 VITALS — BP 120/78 | HR 67 | Ht <= 58 in | Wt 186.6 lb

## 2020-03-23 DIAGNOSIS — I2699 Other pulmonary embolism without acute cor pulmonale: Secondary | ICD-10-CM | POA: Diagnosis not present

## 2020-03-23 DIAGNOSIS — Z7901 Long term (current) use of anticoagulants: Secondary | ICD-10-CM | POA: Diagnosis not present

## 2020-03-23 MED ORDER — APIXABAN 5 MG PO TABS: 5.0000 mg | ORAL_TABLET | Freq: Two times a day (BID) | ORAL | 3 refills | Status: DC

## 2020-03-23 NOTE — Patient Instructions (Signed)
Nice meeting you today!  We will give you a call regarding cost of Apixaban (Eliquis) to determine if it is affordable.   If affordable, we will give you directions on how to start the medication.  No changes today, please continue warfarin as instructed

## 2020-03-23 NOTE — Progress Notes (Signed)
Reviewed: I agree with Dr. Koval's documentation and management. 

## 2020-03-23 NOTE — Assessment & Plan Note (Signed)
History of PE 3 years ago.  Long-term anticoagulation planned.  Patient is currently therapeutic (INR = 2.2) on a warfarin dose of 6mg  daily. - Patient is a good candidate for switch from Warfarin to [place] therapy (lower risk of bleeding, no INR checks, no food restrictions). Confirmed co-pay cost for 30-day supply is $47 and $131 for a 90-day supply.  - Patient agrees to switch from warfarin to apixaban (Eliquis) 5mg  BID. Patient will continue warfarin 6mg  daily as instructed. Patient has scheduled INR appointment (03/30/2020) and instructed to skip dose of warfarin day before INR check (03/29/2020) and day of INR check. Patient will start apixaban (Eliquis) when INR <2. Patient verbalized agreement with treatment plan. Will provide samples of apixaban (Eliquis) for 4 weeks.

## 2020-03-23 NOTE — Progress Notes (Signed)
    Pharmacy Anticoagulation Clinic  Subjective: Patient presents in good spirits. Patient is here to discuss potentially switching from warfarin to a DOAC (apixaban) for long-term VTE prophylaxis.  Current dose of warfarin: 6mg  daily  Adherence to warfarin: good Signs/symptoms of bleeding: reports frequent bruising. Recent changes in medications: recently started taking Vitamin C daily, Vitamin D3 daily Upcoming procedures that may impact anticoagulation: knee replacement   Objective: Most recent INR (03/02/2020) = 2.2 (at goal INR of 2-3)  Patient has been taking warfarin continuously since March 2019. Had attempted apixaban (Eliquis) before initiating warfarin, which she reports worked, but she had to stop taking it due to cost ($500 per month).  Assessment and Plan: History of PE 3 years ago.  Long-term anticoagulation planned.  Patient is currently therapeutic (INR = 2.2) on a warfarin dose of 6mg  daily. - Patient is a good candidate for switch from Warfarin to [place] therapy (lower risk of bleeding, no INR checks, no food restrictions). Confirmed co-pay cost for 30-day supply is $47 and $131 for a 90-day supply.  - Patient agrees to switch from warfarin to apixaban (Eliquis) 5mg  BID. Patient will continue warfarin 6mg  daily as instructed. Patient has scheduled INR appointment (03/30/2020) and instructed to skip dose of warfarin day before INR check (03/29/2020) and day of INR check. Patient will start apixaban (Eliquis) when INR <2. Patient verbalized agreement with treatment plan. Will provide samples of apixaban (Eliquis) for 4 weeks.    Patient seen with Toma Aran, PharmD Candidate, Wilson Singer, PharmD - PGY-1 Resident, and Lorel Monaco, PharmD, BCPS - PGY2 Pharmacy Resident.

## 2020-03-30 ENCOUNTER — Other Ambulatory Visit: Payer: Self-pay

## 2020-03-30 ENCOUNTER — Ambulatory Visit (INDEPENDENT_AMBULATORY_CARE_PROVIDER_SITE_OTHER): Payer: Medicare Other | Admitting: *Deleted

## 2020-03-30 DIAGNOSIS — Z7901 Long term (current) use of anticoagulants: Secondary | ICD-10-CM

## 2020-03-30 DIAGNOSIS — I2699 Other pulmonary embolism without acute cor pulmonale: Secondary | ICD-10-CM | POA: Diagnosis not present

## 2020-03-30 LAB — POCT INR: INR: 1.7 — AB (ref 2.0–3.0)

## 2020-03-30 NOTE — Progress Notes (Addendum)
Patient presents today in good spirits with plans to transition from warfarin to Eliquis.  Confirmed patient's last dose of warfarin was 03/29/19. INR today in 1.7. Patient educated on purpose, proper use and potential adverse effects of bruising and bleeding with Eliquis.  Following instruction patient verbalized understanding of treatment plan.   Plan: 1. Discontinued warfarin 2. Started Eliquis (apixaban) 5 mg BID  Medication Samples have been provided to the patient.  Drug name: Eliquis (apixaban)     Strength: 5 mg BID        Qty: 4 boxes  LOT: [id number]  Exp.Date: 06/17/21  Dosing instructions: Take 5 mg BID   The patient has been instructed regarding the correct time, dose, and frequency of taking this medication, including desired effects and most common side effects.   Toma Aran, PharmD Candidate and Lorel Monaco, PharmD, BCPS - PGY2 Pharmacy Resident.

## 2020-05-03 ENCOUNTER — Ambulatory Visit (INDEPENDENT_AMBULATORY_CARE_PROVIDER_SITE_OTHER): Payer: Medicare Other | Admitting: Family Medicine

## 2020-05-03 ENCOUNTER — Encounter: Payer: Self-pay | Admitting: Family Medicine

## 2020-05-03 ENCOUNTER — Other Ambulatory Visit: Payer: Self-pay

## 2020-05-03 VITALS — BP 102/58 | HR 73 | Ht <= 58 in | Wt 184.0 lb

## 2020-05-03 DIAGNOSIS — E559 Vitamin D deficiency, unspecified: Secondary | ICD-10-CM | POA: Diagnosis not present

## 2020-05-03 DIAGNOSIS — W19XXXD Unspecified fall, subsequent encounter: Secondary | ICD-10-CM | POA: Diagnosis not present

## 2020-05-03 DIAGNOSIS — L4050 Arthropathic psoriasis, unspecified: Secondary | ICD-10-CM | POA: Diagnosis not present

## 2020-05-03 DIAGNOSIS — Z7901 Long term (current) use of anticoagulants: Secondary | ICD-10-CM

## 2020-05-03 DIAGNOSIS — F410 Panic disorder [episodic paroxysmal anxiety] without agoraphobia: Secondary | ICD-10-CM | POA: Diagnosis not present

## 2020-05-03 DIAGNOSIS — F341 Dysthymic disorder: Secondary | ICD-10-CM

## 2020-05-03 DIAGNOSIS — I10 Essential (primary) hypertension: Secondary | ICD-10-CM | POA: Diagnosis not present

## 2020-05-03 MED ORDER — ALPRAZOLAM 0.5 MG PO TABS: 0.5000 mg | ORAL_TABLET | Freq: Three times a day (TID) | ORAL | 5 refills | Status: DC | PRN

## 2020-05-03 NOTE — Patient Instructions (Addendum)
Consider restarting the Vitamin D 3, aiming for 5000 or more units per week. Add calcium up to 800 inits a day. I would plan to see you in 6 months.

## 2020-05-04 ENCOUNTER — Encounter: Payer: Self-pay | Admitting: Family Medicine

## 2020-05-04 LAB — COMPREHENSIVE METABOLIC PANEL
ALT: 21 IU/L (ref 0–32)
AST: 21 IU/L (ref 0–40)
Albumin/Globulin Ratio: 1.8 (ref 1.2–2.2)
Albumin: 3.7 g/dL — ABNORMAL LOW (ref 3.8–4.8)
Alkaline Phosphatase: 90 IU/L (ref 44–121)
BUN/Creatinine Ratio: 20 (ref 12–28)
BUN: 12 mg/dL (ref 8–27)
Bilirubin Total: <0.2 mg/dL (ref 0.0–1.2)
CO2: 23 mmol/L (ref 20–29)
Calcium: 8.6 mg/dL — ABNORMAL LOW (ref 8.7–10.3)
Chloride: 101 mmol/L (ref 96–106)
Creatinine, Ser: 0.6 mg/dL (ref 0.57–1.00)
Globulin, Total: 2.1 g/dL (ref 1.5–4.5)
Glucose: 90 mg/dL (ref 65–99)
Potassium: 4.8 mmol/L (ref 3.5–5.2)
Sodium: 138 mmol/L (ref 134–144)
Total Protein: 5.8 g/dL — ABNORMAL LOW (ref 6.0–8.5)
eGFR: 98 mL/min/{1.73_m2} (ref >59)

## 2020-05-04 NOTE — Progress Notes (Signed)
Normal letter sent

## 2020-05-04 NOTE — Assessment & Plan Note (Signed)
Reviewed her bone density exam.  She was in the osteoporotic category.  We discussed possible treatments.  I definitely think she should get back on her vitamin D and calcium and she agrees to that.  She does not think she wants to consider any more medications right now that would include medicines for osteoporosis.  If she changes her mind, she will let me know and I would consider referral to Dr. Layne Benton for further evaluation and management.

## 2020-05-04 NOTE — Assessment & Plan Note (Signed)
Tolerating apixaban well.  She will continue on that and we will follow-up as needed.

## 2020-05-04 NOTE — Assessment & Plan Note (Signed)
Continue on her current medications.

## 2020-05-04 NOTE — Assessment & Plan Note (Signed)
For financial reasons and formulary issues we will switch her 1 mg lorazepam to 0.5 mg.  We will also decrease her dose a little bit as there is a quantity limit and she is not taking the full 2 mg every day.  She is okay with this.

## 2020-05-04 NOTE — Assessment & Plan Note (Signed)
I do think both her fatigue and her joint pains of her feet are related to her psoriatic arthritis.  I would like to get her back in with her rheumatologist.  She will call and see if she can schedule appointment.  If she needs to 1 more referral, she will let me know.  One of the reasons for not following with him was the financial aspect of the co-pay.

## 2020-05-04 NOTE — Progress Notes (Signed)
CHIEF COMPLAINT / HPI:   #1.  Fatigue.  Seems to be really impairing what she can do.  Can not always get through her chores for the day.  Generalized fatigue.  No specific shortness of breath or muscle weakness.  Has been gradually getting worse over the last year to 2 years.   #2.  She is also having increasing joint pains particularly in her feet.  She stopped seeing her rheumatologist secondary to financial reasons. #3.  Has fallen several times in the last few months.  Usually loses her balance or tripped over something.  Has not been injured specifically but feels like she does not have good balance right now.  Unfortunately when she falls she has difficulty getting herself up because she is weak.  Has to call for assistance much of the time. #4.  Anticoagulation: She has recently switched from warfarin to apixaban and so far is doing well without any unusual bleeding or bruising.  Reason for long-term anticoagulation for prior  pulmonary embolus. #5 anxiety and depression.  Doing pretty well.  Unfortunately the cost of her 1 mg tablet of lorazepam is almost now $200.  She has done some checking in the 0.5 mg tablets are much cheaper and would like to switch.  He does not take them all of the time but feels that they are necessary 4 to 6 days out of the week.  May significantly help with her anxiety.  PERTINENT  PMH / PSH: I have reviewed the patient's medications, allergies, past medical and surgical history, smoking status and updated in the EMR as appropriate. Pulmonary embolus  OBJECTIVE:  BP (!) 102/58   Pulse 73   Ht 4\' 10"  (1.473 m)   Wt 184 lb (83.5 kg)   SpO2 98%   BMI 38.46 kg/m   PSYCH: AxOx4. Good eye contact.. No psychomotor retardation or agitation. Appropriate speech fluency and content. Asks and answers questions appropriately. Mood is congruent. Vital signs reviewed. GENERAL: Well-developed, well-nourished, no acute distress. CARDIOVASCULAR: Regular rate and rhythm  no murmur gallop or rub LUNGS: Clear to auscultation bilaterally, no rales or wheeze. ABDOMEN: Soft positive bowel sounds NEURO: No gross focal neurological deficits. MSK: Movement of extremity x 4.   ASSESSMENT / PLAN:   PANIC ATTACKS For financial reasons and formulary issues we will switch her 1 mg lorazepam to 0.5 mg.  We will also decrease her dose a little bit as there is a quantity limit and she is not taking the full 2 mg every day.  She is okay with this.  Psoriatic arthritis I do think both her fatigue and her joint pains of her feet are related to her psoriatic arthritis.  I would like to get her back in with her rheumatologist.  She will call and see if she can schedule appointment.  If she needs to 1 more referral, she will let me know.  One of the reasons for not following with him was the financial aspect of the co-pay.  Dysthymia Continue on her current medications.  Chronic anticoagulation Tolerating apixaban well.  She will continue on that and we will follow-up as needed.  Vitamin D deficiency Reviewed her bone density exam.  She was in the osteoporotic category.  We discussed possible treatments.  I definitely think she should get back on her vitamin D and calcium and she agrees to that.  She does not think she wants to consider any more medications right now that would include medicines for  osteoporosis.  If she changes her mind, she will let me know and I would consider referral to Dr. Layne Benton for further evaluation and management.  Fall She has had for 5 falls since the beginning of the year.  At one point she had participated in physical therapy for gait analysis and had some benefit from that.  She said it was very expensive co-pay and she is not sure she can afford that right now.  We discussed some balancing exercises at home.  She will also consider whether she is willing to go for 1 or 2 sessions with PT to get a home exercise program.  I would also like to  include generalized strengthening.  She is going to consider this as well.   Dorcas Mcmurray MD

## 2020-05-04 NOTE — Assessment & Plan Note (Signed)
She has had for 5 falls since the beginning of the year.  At one point she had participated in physical therapy for gait analysis and had some benefit from that.  She said it was very expensive co-pay and she is not sure she can afford that right now.  We discussed some balancing exercises at home.  She will also consider whether she is willing to go for 1 or 2 sessions with PT to get a home exercise program.  I would also like to include generalized strengthening.  She is going to consider this as well.

## 2020-05-31 ENCOUNTER — Other Ambulatory Visit: Payer: Self-pay | Admitting: Family Medicine

## 2020-05-31 DIAGNOSIS — Z1231 Encounter for screening mammogram for malignant neoplasm of breast: Secondary | ICD-10-CM

## 2020-06-01 ENCOUNTER — Other Ambulatory Visit: Payer: Self-pay | Admitting: Family Medicine

## 2020-06-01 DIAGNOSIS — Z7901 Long term (current) use of anticoagulants: Secondary | ICD-10-CM

## 2020-06-01 DIAGNOSIS — I2699 Other pulmonary embolism without acute cor pulmonale: Secondary | ICD-10-CM

## 2020-06-02 ENCOUNTER — Other Ambulatory Visit: Payer: Self-pay | Admitting: Family Medicine

## 2020-06-02 DIAGNOSIS — I10 Essential (primary) hypertension: Secondary | ICD-10-CM

## 2020-07-23 ENCOUNTER — Telehealth: Payer: Medicare Other | Admitting: Family

## 2020-07-23 DIAGNOSIS — H65192 Other acute nonsuppurative otitis media, left ear: Secondary | ICD-10-CM | POA: Diagnosis not present

## 2020-07-23 MED ORDER — AMOXICILLIN 875 MG PO TABS: 875.0000 mg | ORAL_TABLET | Freq: Two times a day (BID) | ORAL | 0 refills | Status: DC

## 2020-07-23 NOTE — Progress Notes (Signed)
E Visit for Swimmer's Ear  We are sorry that you are not feeling well. Here is how we plan to help!  I have sent Amoxicillin 875 mg twice a day for 7 days.   In certain cases swimmer's ear may progress to a more serious bacterial infection of the middle or inner ear.  If you have a fever 102 and up and significantly worsening symptoms, this could indicate a more serious infection moving to the middle/inner and needs face to face evaluation in an office by a provider.  Your symptoms should improve over the next 3 days and should resolve in about 7 days.  HOME CARE:   Wash your hands frequently.  Do not place the tip of the bottle on your ear or touch it with your fingers.  You can take Acetominophen 650 mg every 4-6 hours as needed for pain.  If pain is severe or moderate, you can apply a heating pad (set on low) or hot water bottle (wrapped in a towel) to outer ear for 20 minutes.  This will also increase drainage.  Avoid ear plugs  Do not use Q-tips  After showers, help the water run out by tilting your head to one side.  GET HELP RIGHT AWAY IF:   Fever is over 102.2 degrees.  You develop progressive ear pain or hearing loss.  Ear symptoms persist longer than 3 days after treatment.  MAKE SURE YOU:   Understand these instructions.  Will watch your condition.  Will get help right away if you are not doing well or get worse.  TO PREVENT SWIMMER'S EAR:  Use a bathing cap or custom fitted swim molds to keep your ears dry.  Towel off after swimming to dry your ears.  Tilt your head or pull your earlobes to allow the water to escape your ear canal.  If there is still water in your ears, consider using a hairdryer on the lowest setting.  Thank you for choosing an e-visit. Your e-visit answers were reviewed by a board certified advanced clinical practitioner to complete your personal care plan. Depending upon the condition, your plan could have included both over the  counter or prescription medications. Please review your pharmacy choice. Be sure that the pharmacy you have chosen is open so that you can pick up your prescription now.  If there is a problem you may message your provider in [place] to have the prescription routed to another pharmacy. Your safety is important to [place]. If you have drug allergies check your prescription carefully.  For the next 24 hours, you can use MyChart to ask questions about today's visit, request a non-urgent call back, or ask for a work or school excuse from your e-visit provider. You will get an email in the next two days asking about your experience. I hope that your e-visit has been valuable and will speed your recovery.    Approximately 5 minutes was spent documenting and reviewing patient's chart.

## 2020-07-24 ENCOUNTER — Ambulatory Visit: Payer: Medicare Other

## 2020-08-08 ENCOUNTER — Encounter: Payer: Self-pay | Admitting: Family Medicine

## 2020-08-23 NOTE — Progress Notes (Signed)
Submitted application for ELIQUIS to [employer]) for patient assistance.   Phone: [phone]

## 2020-09-07 ENCOUNTER — Ambulatory Visit: Payer: Medicare Other

## 2020-09-12 ENCOUNTER — Telehealth: Payer: Self-pay

## 2020-09-12 NOTE — Telephone Encounter (Signed)
Received notification from [place] ([place]) regarding patient assistance DENIAL for [employer].   Pt has not met the medication oop expense limit to qualify. She neds to spend total of $432.32. Pt has spent $327.82. Needs to spend $105 more. Pt aware and will let me know when limit is reached and if she would like to continue with PAP.  Phone:[phone]

## 2020-09-14 ENCOUNTER — Ambulatory Visit
Admission: RE | Admit: 2020-09-14 | Discharge: 2020-09-14 | Disposition: A | Discharge status: Home / Self Care | Payer: Medicare Other | Source: Ambulatory Visit | Attending: Family Medicine | Admitting: Family Medicine

## 2020-09-14 ENCOUNTER — Other Ambulatory Visit: Payer: Self-pay | Admitting: Family Medicine

## 2020-09-14 ENCOUNTER — Other Ambulatory Visit: Payer: Self-pay

## 2020-09-14 ENCOUNTER — Ambulatory Visit (INDEPENDENT_AMBULATORY_CARE_PROVIDER_SITE_OTHER): Payer: Medicare Other

## 2020-09-14 VITALS — BP 110/64 | HR 79 | Ht 58.5 in | Wt 176.0 lb

## 2020-09-14 DIAGNOSIS — Z0000 Encounter for general adult medical examination without abnormal findings: Secondary | ICD-10-CM | POA: Diagnosis not present

## 2020-09-14 DIAGNOSIS — N63 Unspecified lump in unspecified breast: Secondary | ICD-10-CM

## 2020-09-14 DIAGNOSIS — Z1231 Encounter for screening mammogram for malignant neoplasm of breast: Secondary | ICD-10-CM

## 2020-09-14 NOTE — Patient Instructions (Addendum)
You spoke to [name], [place] over the phone for your annual wellness visit.  We discussed goals:   Goals      DIET - INCREASE WATER INTAKE       We also discussed recommended health maintenance. Please call our office and schedule a visit. As discussed, you are due for:  Health Maintenance  Topic Date Due   Zoster Vaccines- Shingrix (1 of 2) Never done   COVID-19 Vaccine (4 - Booster for Pfizer series) 05/06/2020   INFLUENZA VACCINE  09/18/2020   MAMMOGRAM  07/01/2021   TETANUS/TDAP  02/15/2026   COLONOSCOPY (Pts 45-28yr Insurance coverage will need to be confirmed)  07/25/2026   DEXA SCAN  Completed   Hepatitis C Screening  Completed   PNA vac Low Risk Adult  Completed   HPV VACCINES  Aged Out   Mammogram apt today at 2:40pm.  PCP apt scheduled for 9/21 @ 8:30am. You are due for #2 booster. We will have flu vaccines by the end of August.  Health Maintenance, Female Adopting a healthy lifestyle and getting preventive care are important in promoting health and wellness. Ask your health care provider about: The right schedule for you to have regular tests and exams. Things you can do on your own to prevent diseases and keep yourself healthy. What should I know about diet, weight, and exercise? Eat a healthy diet  Eat a diet that includes plenty of vegetables, fruits, low-fat dairy products, and lean protein. Do not eat a lot of foods that are high in solid fats, added sugars, or sodium.  Maintain a healthy weight Body mass index (BMI) is used to identify weight problems. It estimates body fat based on height and weight. Your health care provider can help determineyour BMI and help you achieve or maintain a healthy weight. Get regular exercise Get regular exercise. This is one of the most important things you can do for your health. Most adults should: Exercise for at least 150 minutes each week. The exercise should increase your heart rate and make you sweat  (moderate-intensity exercise). Do strengthening exercises at least twice a week. This is in addition to the moderate-intensity exercise. Spend less time sitting. Even light physical activity can be beneficial. Watch cholesterol and blood lipids Have your blood tested for lipids and cholesterol at 68years of age, then havethis test every 5 years. Have your cholesterol levels checked more often if: Your lipid or cholesterol levels are high. You are older than 68years of age. You are at high risk for heart disease. What should I know about cancer screening? Depending on your health history and family history, you may need to have cancer screening at various ages. This may include screening for: Breast cancer. Cervical cancer. Colorectal cancer. Skin cancer. Lung cancer. What should I know about heart disease, diabetes, and high blood pressure? Blood pressure and heart disease High blood pressure causes heart disease and increases the risk of stroke. This is more likely to develop in people who have high blood pressure readings, are of African descent, or are overweight. Have your blood pressure checked: Every 3-5 years if you are 89362years of age. Every year if you are 463years old or older. Diabetes Have regular diabetes screenings. This checks your fasting blood sugar level. Have the screening done: Once every three years after age 4341if you are at a normal weight and have a low risk for diabetes. More often and at a younger age if  you are overweight or have a high risk for diabetes. What should I know about preventing infection? Hepatitis B If you have a higher risk for hepatitis B, you should be screened for this virus. Talk with your health care provider to find out if you are at risk forhepatitis B infection. Hepatitis C Testing is recommended for: Everyone born from [birth year] through 1963. Anyone with known risk factors for hepatitis C. Sexually transmitted infections (STIs) Get  screened for STIs, including gonorrhea and chlamydia, if: You are sexually active and are younger than 68 years of age. You are older than 68 years of age and your health care provider tells you that you are at risk for this type of infection. Your sexual activity has changed since you were last screened, and you are at increased risk for chlamydia or gonorrhea. Ask your health care provider if you are at risk. Ask your health care provider about whether you are at high risk for HIV. Your health care provider may recommend a prescription medicine to help prevent HIV infection. If you choose to take medicine to prevent HIV, you should first get tested for HIV. You should then be tested every 3 months for as long as you are taking the medicine. Pregnancy If you are about to stop having your period (premenopausal) and you may become pregnant, seek counseling before you get pregnant. Take 400 to 800 micrograms (mcg) of folic acid every day if you become pregnant. Ask for birth control (contraception) if you want to prevent pregnancy. Osteoporosis and menopause Osteoporosis is a disease in which the bones lose minerals and strength with aging. This can result in bone fractures. If you are 24 years old or older, or if you are at risk for osteoporosis and fractures, ask your health care provider if you should: Be screened for bone loss. Take a calcium or vitamin D supplement to lower your risk of fractures. Be given hormone replacement therapy (HRT) to treat symptoms of menopause. Follow these instructions at home: Lifestyle Do not use any products that contain nicotine or tobacco, such as cigarettes, e-cigarettes, and chewing tobacco. If you need help quitting, ask your health care provider. Do not use street drugs. Do not share needles. Ask your health care provider for help if you need support or information about quitting drugs. Alcohol use Do not drink alcohol if: Your health care provider tells  you not to drink. You are pregnant, may be pregnant, or are planning to become pregnant. If you drink alcohol: Limit how much you use to 0-1 drink a day. Limit intake if you are breastfeeding. Be aware of how much alcohol is in your drink. In the [place] one drink equals one 12 oz bottle of beer (355 mL), one 5 oz glass of wine (148 mL), or one 1 oz glass of hard liquor (44 mL). General instructions Schedule regular health, dental, and eye exams. Stay current with your vaccines. Tell your health care provider if: You often feel depressed. You have ever been abused or do not feel safe at home. Summary Adopting a healthy lifestyle and getting preventive care are important in promoting health and wellness. Follow your health care provider's instructions about healthy diet, exercising, and getting tested or screened for diseases. Follow your health care provider's instructions on monitoring your cholesterol and blood pressure. This information is not intended to replace advice given to you by your health care provider. Make sure you discuss any questions you have with your healthcare provider. Document  Revised: 01/28/2018 Document Reviewed: 01/28/2018 Elsevier Patient Education  2022 [employer].  Here is an example of what a healthy plate looks like:    ? Make half your plate fruits and vegetables.     ? Focus on whole fruits.     ? Vary your veggies.  ? Make half your grains whole grains. -     ? Look for the word "whole" at the beginning of the ingredients list    ? Some whole-grain ingredients include whole oats, whole-wheat flour,        whole-grain corn, whole-grain brown rice, and whole rye.  ? Move to low-fat and fat-free milk or yogurt.  ? Vary your protein routine. - Meat, fish, poultry (chicken, [place]), eggs, beans (kidney, pinto), dairy.  ? Drink and eat less sodium, saturated fat, and added sugars.   Our clinic's number is [phone]. Please call with questions  or concerns about what we discussed today.

## 2020-09-14 NOTE — Progress Notes (Addendum)
Subjective:   [name] is a 70 y.o. female who presents for Medicare Annual (Subsequent) preventive examination.  Patient consented to have virtual visit and was identified by name and date of birth. Method of visit: Telephone  Encounter participants: Patient: [name] - located at Home Nurse/Provider: Dorna Bloom - located at [facility] Others (if applicable): NA  Review of Systems: Defer to PCP  Cardiac Risk Factors include: advanced age (>65mn, >>28women);obesity (BMI >30kg/m2)  Objective:   Vitals: BP 110/64   Pulse 79   Ht 4' 10.5" (1.486 m)   Wt 176 lb (79.8 kg)   BMI 36.16 kg/m   Body mass index is 36.16 kg/m.  Advanced Directives 09/14/2020 05/03/2020 08/18/2019 04/19/2019 09/23/2018 09/02/2018 08/19/2018  Does Patient Have a Medical Advance Directive? Yes No No No Yes Yes No  Type of AParamedicof APeerlessLiving will - - - - HPress photographerLiving will -  Does patient want to make changes to medical advance directive? No - Patient declined - - - - - -  Copy of HHaiglerin Chart? Yes - validated most recent copy scanned in chart (See row information) - - - - Yes - validated most recent copy scanned in chart (See row information) -  Would patient like information on creating a medical advance directive? - No - Patient declined No - Patient declined No - Patient declined - - No - Patient declined   Tobacco Social History   Tobacco Use  Smoking Status Never  Smokeless Tobacco Never     Clinical Intake:  Pre-visit preparation completed: Yes  Pain Score: 6   How often do you need to have someone help you when you read instructions, pamphlets, or other written materials from your doctor or pharmacy?: 1 - Never What is the last grade level you completed in school?: College  Interpreter Needed?: No  Past Medical History:  Diagnosis Date   Anemia    Anxiety    Arthritis    Depression    Dysrhythmia     occ due to caffeine intake   Encounter for long-term (current) use of other medications    GERD (gastroesophageal reflux disease)    Hepatitis    History of hiatal hernia    Hypertension    IBS (irritable bowel syndrome)    Obesity    Osteoarthritis    Pain in joint, multiple sites    Pneumonia    PONV (postoperative nausea and vomiting)    in the 1980's no problems since   [place]    [place]   submassive/notes 12/03/2016   Raynaud disease    Past Surgical History:  Procedure Laterality Date   ABDOMINAL HYSTERECTOMY     COLONOSCOPY     GASTRIC BYPASS     HALLUX VALGUS CORRECTION  09/2010   Dr EArnetha Courser right 1st ray   HERNIA REPAIR     JOINT REPLACEMENT     left knee times 3   LAPAROSCOPIC CHOLECYSTECTOMY  04/2000   /notes 07/03/2010   LAPAROSCOPIC INCISIONAL / UMBILICAL / [place] 09/2002   VHR/notes 07/03/2010   REVISION TOTAL KNEE ARTHROPLASTY Left 10/2003   /Archie Endo5/15/2012   REVISION TOTAL KNEE ARTHROPLASTY Left 02/2007   /Archie Endo5/04/2010   SHOULDER HEMI-ARTHROPLASTY Right 06/28/2016   Procedure: RIGHT SHOULDER HEMI-ARTHROPLASTY;  Surgeon: NNetta Cedars MD;  [place]  Service: Orthopedics;  Laterality: Right;  TOTAL KNEE ARTHROPLASTY Left 10/1999   Archie Endo 07/03/2010   TUBAL LIGATION     Family History  Problem Relation Age of Onset   Depression Mother    Obesity Father    Deep vein thrombosis Father    Arthritis Maternal Grandmother    Arthritis Maternal Grandfather    Arthritis Paternal Grandmother    Arthritis Paternal Grandfather    Breast cancer Neg Hx    Social History   Socioeconomic History   Marital status: Married    Spouse name: [name]   Number of children: 1   Years of education: 16   Highest education level: Bachelor's degree (e.g., BA, AB, BS)  Occupational History   Not on file  Tobacco Use   Smoking status: Never   Smokeless tobacco: Never  Vaping Use   Vaping  Use: Never used  Substance and Sexual Activity   Alcohol use: No   Drug use: No   Sexual activity: Yes  Other Topics Concern   Not on file  Social History Narrative   Patient lives with her husband in [place].    Patient has one daughter, two dogs, and  two cats.    Patient is an avid reader and enjoys doing crafts.    Social Determinants of Health   Financial Resource Strain: Low Risk    Difficulty of Paying Living Expenses: Not hard at all  Food Insecurity: No Food Insecurity   Worried About Charity fundraiser in the Last Year: Never true   [place] in the Last Year: Never true  Transportation Needs: No Transportation Needs   Lack of Transportation (Medical): No   Lack of Transportation (Non-Medical): No  Physical Activity: Inactive   Days of Exercise per Week: 0 days   Minutes of Exercise per Session: 0 min  Stress: Stress Concern Present   Feeling of Stress : To some extent  Social Connections: Moderately Isolated   Frequency of Communication with Friends and Family: Three times a week   Frequency of Social Gatherings with Friends and Family: Twice a week   Attends Religious Services: Never   Marine scientist or Organizations: No   Attends Archivist Meetings: Never   Marital Status: Married   Outpatient Encounter Medications as of 09/14/2020  Medication Sig   ALPRAZolam (XANAX) 0.5 MG tablet Take 1 tablet (0.5 mg total) by mouth 3 (three) times daily as needed for anxiety.   benazepril (LOTENSIN) 20 MG tablet TAKE 1 TABLET BY MOUTH  DAILY   cholecalciferol (VITAMIN D3) 25 MCG (1000 UNIT) tablet Take 1,000 Units by mouth daily.   diclofenac (VOLTAREN) 75 MG EC tablet TAKE 2 TABLET BY MOUTH ONCE DAILY AS NEEDED FOR JOINT PAIN   ELIQUIS 5 MG TABS tablet TAKE 1 TABLET BY MOUTH  TWICE DAILY   esomeprazole (NEXIUM) 20 MG capsule TAKE 1 CAPSULE(20 MG) BY MOUTH DAILY   fluconazole (DIFLUCAN) 100 MG tablet TAKE 1 TABLET BY MOUTH ONCE WEEKLY    furosemide (LASIX) 20 MG tablet TAKE 2 TABLETS BY MOUTH IN  THE MORNING . MAY TAKE 1  TABLET BY MOUTH IN THE  AFTERNOON AS ADDITIONAL  DOSE AS NEEDED   venlafaxine XR (EFFEXOR-XR) 150 MG 24 hr capsule TAKE 1 CAPSULE BY MOUTH  DAILY   venlafaxine XR (EFFEXOR-XR) 75 MG 24 hr capsule TAKE 1 CAPSULE BY MOUTH  DAILY IN ADDITION TO '150MG'$   CAPSULE FOR '225MG'$  DAILY   amoxicillin (AMOXIL) 875 MG tablet Take 1  tablet (875 mg total) by mouth 2 (two) times daily. (Patient not taking: Reported on 09/14/2020)   No facility-administered encounter medications on file as of 09/14/2020.   Activities of Daily Living In your present state of health, do you have any difficulty performing the following activities: 09/14/2020  Hearing? Y  Comment bilateral hearing aides  Vision? N  Difficulty concentrating or making decisions? N  Walking or climbing stairs? Y  Dressing or bathing? N  Doing errands, shopping? N  Preparing Food and eating ? N  Using the Toilet? N  In the past six months, have you accidently leaked urine? Y  Do you have problems with loss of bowel control? Y  Managing your Medications? N  Managing your Finances? N  Housekeeping or managing your Housekeeping? N  Some recent data might be hidden   Patient Care Team: [name], MD as PCP - General    Assessment:   This is a routine wellness examination for Karleigh.  Exercise Activities and Dietary recommendations Current Exercise Habits: The patient does not participate in regular exercise at present, Exercise limited by: orthopedic condition(s)   Goals      DIET - INCREASE WATER INTAKE       Fall Risk Fall Risk  09/14/2020 04/19/2019 12/16/2018 09/23/2018 08/19/2018  Falls in the past year? '1 1 1 '$ 0 1  Number falls in past yr: 1 1 0 - 1  Injury with Fall? 1 1 - - 1  Comment - - - - scraped front of leg  Risk for fall due to : Impaired balance/gait Impaired balance/gait;Impaired mobility - - -  Follow up Falls prevention discussed Falls  prevention discussed - - -   Is the patient's home free of loose throw rugs in walkways, pet beds, electrical cords, etc?   yes      Grab bars in the bathroom? yes      Handrails on the stairs?   yes      Adequate lighting?   yes  Patient rating of health (0-10) scale: 6   Depression Screen PHQ 2/9 Scores 09/14/2020 05/03/2020 12/16/2019 08/18/2019  PHQ - 2 Score 0 0 0 0  PHQ- 9 Score - 3 0 -  Exception Documentation - - - -   Cognitive Function  6CIT Screen 09/14/2020  What Year? 0 points  What month? 0 points  What time? 0 points  Count back from 20 0 points  Months in reverse 0 points  Repeat phrase 0 points  Total Score 0   Immunization History  Administered Date(s) Administered   Influenza Whole 10/20/2007, 11/22/2011   Influenza,inj,Quad PF,6+ Mos 02/22/2015, 11/13/2015, 10/23/2016, 10/28/2017   Influenza-Unspecified 10/31/2018, 12/01/2019   PFIZER(Purple Top)SARS-COV-2 Vaccination 04/22/2019, 05/18/2019, 01/07/2020   Pneumococcal Conjugate-13 10/28/2017   Pneumococcal Polysaccharide-23 08/18/2019   Td 04/18/2005   Tdap 02/16/2016   Zoster, Live 09/08/2013   Screening Tests Health Maintenance  Topic Date Due   Zoster Vaccines- Shingrix (1 of 2) Never done   COVID-19 Vaccine (4 - Booster for Pfizer series) 05/06/2020   INFLUENZA VACCINE  09/18/2020   MAMMOGRAM  07/01/2021   TETANUS/TDAP  02/15/2026   COLONOSCOPY (Pts 45-26yr Insurance coverage will need to be confirmed)  07/25/2026   DEXA SCAN  Completed   Hepatitis C Screening  Completed   PNA vac Low Risk Adult  Completed   HPV VACCINES  Aged Out   Cancer Screenings: Lung: Low Dose CT Chest recommended if Age 68-80years, 30 pack-year currently smoking  OR have quit w/in 15years. Patient does not qualify. Breast:  Up to date on Mammogram? Apt today Up to date of Bone Density/Dexa? Yes Colorectal: UTD  Additional Screenings: Hepatitis C Screening: Completed  HIV Screening: Completed   Plan:  Mammogram  apt today at 2:40pm.  PCP apt scheduled for 9/21 @ 8:30am. You are due for #2 booster. We will have flu vaccines by the end of August.   I have personally reviewed and noted the following in the patient's chart:   Medical and social history Use of alcohol, tobacco or illicit drugs  Current medications and supplements Functional ability and status Nutritional status Physical activity Advanced directives List of other physicians Hospitalizations, surgeries, and ER visits in previous 12 months Vitals Screenings to include cognitive, depression, and falls Referrals and appointments  In addition, I have reviewed and discussed with patient certain preventive protocols, quality metrics, and best practice recommendations. A written personalized care plan for preventive services as well as general preventive health recommendations were provided to patient.  This visit was conducted virtually in the setting of the [place] pandemic.    Dorna Bloom, [place]  09/14/2020     I have reviewed this visit and agree with the documentation.

## 2020-09-15 ENCOUNTER — Encounter: Payer: Self-pay | Admitting: Family Medicine

## 2020-09-21 NOTE — Progress Notes (Signed)
I have reviewed this visit and agree with the documentation.  Jolyn Deshmukh, MD  Family Medicine Teaching Service   

## 2020-09-22 ENCOUNTER — Other Ambulatory Visit: Payer: Self-pay

## 2020-09-22 ENCOUNTER — Ambulatory Visit: Payer: Medicare Other | Admitting: Pharmacist

## 2020-09-22 DIAGNOSIS — Z7901 Long term (current) use of anticoagulants: Secondary | ICD-10-CM

## 2020-09-22 NOTE — Progress Notes (Signed)
Patient here today to switch from Eliquis back to warfarin. Dosing instructions provided by Dr Valentina Lucks. [name]

## 2020-09-26 ENCOUNTER — Ambulatory Visit
Admission: RE | Admit: 2020-09-26 | Discharge: 2020-09-26 | Disposition: A | Discharge status: Home / Self Care | Payer: Medicare Other | Source: Ambulatory Visit | Attending: Family Medicine | Admitting: Family Medicine

## 2020-09-26 ENCOUNTER — Other Ambulatory Visit: Payer: Self-pay

## 2020-09-26 DIAGNOSIS — R928 Other abnormal and inconclusive findings on diagnostic imaging of breast: Secondary | ICD-10-CM | POA: Diagnosis not present

## 2020-09-26 DIAGNOSIS — N63 Unspecified lump in unspecified breast: Secondary | ICD-10-CM

## 2020-09-26 DIAGNOSIS — N6321 Unspecified lump in the left breast, upper outer quadrant: Secondary | ICD-10-CM | POA: Diagnosis not present

## 2020-10-02 ENCOUNTER — Ambulatory Visit (INDEPENDENT_AMBULATORY_CARE_PROVIDER_SITE_OTHER): Payer: Medicare Other | Admitting: Pharmacist

## 2020-10-02 ENCOUNTER — Other Ambulatory Visit: Payer: Self-pay

## 2020-10-02 DIAGNOSIS — Z7901 Long term (current) use of anticoagulants: Secondary | ICD-10-CM

## 2020-10-02 LAB — POCT INR: INR: 3.2 — AB (ref 2.0–3.0)

## 2020-10-02 NOTE — Patient Instructions (Signed)
[name] it was nice meeting you today!  Your INR was 3.2

## 2020-10-02 NOTE — Progress Notes (Signed)
    Pharmacy Anticoagulation Clinic  Subjective: Patient presents today for INR monitoring. Anticoagulation indication is PE.  Patient previously on Eliquis but could no longer afford due to increase in co-pay. Bridged 10 days ago back to warfarin and patient is presenting for her first follow-up INR check today.  Current dose of warfarin: '6mg'$  every day  Adherence to warfarin: Reports adherence Signs/symptoms of bleeding: None Recent changes in diet: None Recent changes in medications: None Upcoming procedures that may impact anticoagulation: None  Objective: Today's INR = 3.2  Lab Results  Component Value Date   INR 1.7 (A) 03/30/2020   INR 2.2 03/02/2020   INR 2.6 02/16/2020     Assessment and Plan: Anticoagulation: Patient is Supratherapeutic based on patient's INR of 3.2 and patient's INR goal of 2-3. Will Decrease current warfarin dose: 43 mg weekly total to '40mg'$  weekly total  Patient verbalized understanding and was provided with written instructions. Next INR check planned for one week.

## 2020-10-09 ENCOUNTER — Other Ambulatory Visit: Payer: Self-pay

## 2020-10-09 ENCOUNTER — Ambulatory Visit (INDEPENDENT_AMBULATORY_CARE_PROVIDER_SITE_OTHER): Payer: Medicare Other | Admitting: Pharmacist

## 2020-10-09 DIAGNOSIS — Z7901 Long term (current) use of anticoagulants: Secondary | ICD-10-CM

## 2020-10-09 DIAGNOSIS — I2699 Other pulmonary embolism without acute cor pulmonale: Secondary | ICD-10-CM

## 2020-10-09 LAB — POCT INR: INR: 2.4 (ref 2.0–3.0)

## 2020-10-09 NOTE — Progress Notes (Signed)
    Pharmacy Anticoagulation Clinic  Subjective: Patient presents today for INR monitoring. Anticoagulation indication is PE.  Patient previously on Eliquis but could no longer afford due to increase in co-pay. Patient back for second INR post-bridge from eliquis to warfarin. Last INR was supratherapeutic and dose was decreased.   Current dose of warfarin: 4 mg every Mon; 6 mg all other days ('40mg'$  weekly total)  Adherence to warfarin: Reports adherence Signs/symptoms of bleeding: None Recent changes in diet: None Recent changes in medications: None Upcoming procedures that may impact anticoagulation: None  Objective: Today's INR = 2.4  Lab Results  Component Value Date   INR 2.4 10/09/2020   INR 3.2 (A) 10/02/2020   INR 1.7 (A) 03/30/2020     Assessment and Plan: Anticoagulation: Patient is therapeutic based on patient's INR of 2.4 and patient's INR goal of 2-3. Will continue current warfarin dose: 4 mg every Mon; 6 mg all other days ('40mg'$  weekly to  Patient verbalized understanding and was provided with written instructions. Next INR check planned for one week.

## 2020-10-09 NOTE — Patient Instructions (Signed)
No change to dose. Follow-up in 7-10 days

## 2020-10-11 ENCOUNTER — Other Ambulatory Visit: Payer: Self-pay | Admitting: Family Medicine

## 2020-10-17 ENCOUNTER — Ambulatory Visit (INDEPENDENT_AMBULATORY_CARE_PROVIDER_SITE_OTHER): Payer: Medicare Other | Admitting: Pharmacist

## 2020-10-17 ENCOUNTER — Other Ambulatory Visit: Payer: Self-pay

## 2020-10-17 DIAGNOSIS — Z7901 Long term (current) use of anticoagulants: Secondary | ICD-10-CM

## 2020-10-17 LAB — POCT INR: INR: 2.1 (ref 2.0–3.0)

## 2020-10-17 NOTE — Progress Notes (Signed)
    Pharmacy Anticoagulation Clinic  Subjective: Patient presents today for INR monitoring. Anticoagulation indication is PE.  Patient previously on Eliquis but could no longer afford due to increase in co-pay. Patient back for third INR post-bridge from eliquis to warfarin. Last INR was within range.  Current dose of warfarin: 4 mg every Mon; 6 mg all other days ('40mg'$  weekly total)  Adherence to warfarin: Reports adherence Signs/symptoms of bleeding: None Recent changes in diet: None Recent changes in medications: None Upcoming procedures that may impact anticoagulation: None  Objective: Today's INR = 2.1  Lab Results  Component Value Date   INR 2.1 10/17/2020   INR 2.4 10/09/2020   INR 3.2 (A) 10/02/2020     Assessment and Plan: Anticoagulation: Patient is therapeutic based on patient's INR of 2.1 and patient's INR goal of 2-3. Will continue current warfarin dose: 4 mg every Mon; 6 mg all other days ('40mg'$  weekly) but have patient return in 10 days due to INR beginning to trend downwards.  Patient verbalized understanding and was provided with written instructions. Next INR check planned for next Thursday.

## 2020-10-17 NOTE — Patient Instructions (Addendum)
It was nice seeing you [name]!  Return in about 10 days. No changes to medications

## 2020-10-26 ENCOUNTER — Other Ambulatory Visit: Payer: Self-pay

## 2020-10-26 ENCOUNTER — Ambulatory Visit (INDEPENDENT_AMBULATORY_CARE_PROVIDER_SITE_OTHER): Payer: Medicare Other | Admitting: Pharmacist

## 2020-10-26 DIAGNOSIS — Z7901 Long term (current) use of anticoagulants: Secondary | ICD-10-CM | POA: Diagnosis not present

## 2020-10-26 LAB — POCT INR: INR: 2.2 (ref 2.0–3.0)

## 2020-10-26 NOTE — Patient Instructions (Signed)
It was nice seeing you [name]!  We did not make any changes today.

## 2020-10-26 NOTE — Progress Notes (Signed)
    Pharmacy Anticoagulation Clinic  Subjective: Patient presents today for INR monitoring. Anticoagulation indication is PE.  Patient previously on Eliquis but could no longer afford due to increase in co-pay. Patient back for fourth INR post-bridge from eliquis to warfarin. Last INR was within range.  Current dose of warfarin: 4 mg every Mon; 6 mg all other days ('40mg'$  weekly total)  Adherence to warfarin: Reports adherence Signs/symptoms of bleeding: None Recent changes in diet: None Recent changes in medications: None Upcoming procedures that may impact anticoagulation: None  Objective: Today's INR = 2.2  Lab Results  Component Value Date   INR 2.1 10/17/2020   INR 2.4 10/09/2020   INR 3.2 (A) 10/02/2020     Assessment and Plan: Anticoagulation: Patient is therapeutic based on patient's INR of 2.2 and patient's INR goal of 2-3. Will continue current warfarin dose: 4 mg every Mon; 6 mg all other days ('40mg'$  weekly) and have patient return in 2 weeks.  Patient verbalized understanding and was provided with written instructions. Next INR check planned for appt with PCP on 9/21.

## 2020-11-08 ENCOUNTER — Other Ambulatory Visit: Payer: Self-pay

## 2020-11-08 ENCOUNTER — Encounter: Payer: Self-pay | Admitting: Family Medicine

## 2020-11-08 ENCOUNTER — Ambulatory Visit (INDEPENDENT_AMBULATORY_CARE_PROVIDER_SITE_OTHER): Payer: Medicare Other | Admitting: Family Medicine

## 2020-11-08 VITALS — BP 96/42 | HR 75 | Wt 174.0 lb

## 2020-11-08 DIAGNOSIS — Z7901 Long term (current) use of anticoagulants: Secondary | ICD-10-CM | POA: Diagnosis not present

## 2020-11-08 DIAGNOSIS — M7742 Metatarsalgia, left foot: Secondary | ICD-10-CM

## 2020-11-08 DIAGNOSIS — I10 Essential (primary) hypertension: Secondary | ICD-10-CM

## 2020-11-08 DIAGNOSIS — F5089 Other specified eating disorder: Secondary | ICD-10-CM | POA: Diagnosis not present

## 2020-11-08 DIAGNOSIS — Z23 Encounter for immunization: Secondary | ICD-10-CM

## 2020-11-08 DIAGNOSIS — F341 Dysthymic disorder: Secondary | ICD-10-CM

## 2020-11-08 DIAGNOSIS — M7741 Metatarsalgia, right foot: Secondary | ICD-10-CM | POA: Diagnosis not present

## 2020-11-08 DIAGNOSIS — G629 Polyneuropathy, unspecified: Secondary | ICD-10-CM | POA: Diagnosis not present

## 2020-11-08 LAB — POCT INR: INR: 1.6 — AB (ref 2.0–3.0)

## 2020-11-08 MED ORDER — GABAPENTIN 100 MG PO CAPS: 100.0000 mg | ORAL_CAPSULE | Freq: Every day | ORAL | 0 refills | Status: DC

## 2020-11-08 NOTE — Patient Instructions (Addendum)
Y the gabapentin once at night for 2 weeks and see if it helps the foot pains. If not, increase to one twice a day. Let me know in 3-4 weeks if this is gelping (My Chart me)  You can make an appointment at sports medicine with me (Fridays) and bring your insoles and we can adjust them. [place] the mupirocin for the occasional little red bump.  Let me seeb you in 5 months or so  Increase your walking when you can get a new knee!

## 2020-11-09 DIAGNOSIS — G629 Polyneuropathy, unspecified: Secondary | ICD-10-CM | POA: Insufficient documentation

## 2020-11-09 LAB — CBC
Hematocrit: 30.2 % — ABNORMAL LOW (ref 34.0–46.6)
Hemoglobin: 9 g/dL — ABNORMAL LOW (ref 11.1–15.9)
MCH: 21.5 pg — ABNORMAL LOW (ref 26.6–33.0)
MCHC: 29.8 g/dL — ABNORMAL LOW (ref 31.5–35.7)
MCV: 72 fL — ABNORMAL LOW (ref 79–97)
Platelets: 343 10*3/uL (ref 150–450)
RBC: 4.19 x10E6/uL (ref 3.77–5.28)
RDW: 14.4 % (ref 11.7–15.4)
WBC: 6.1 10*3/uL (ref 3.4–10.8)

## 2020-11-09 LAB — COMPREHENSIVE METABOLIC PANEL
ALT: 21 IU/L (ref 0–32)
AST: 33 IU/L (ref 0–40)
Albumin/Globulin Ratio: 1.8 (ref 1.2–2.2)
Albumin: 4 g/dL (ref 3.8–4.8)
Alkaline Phosphatase: 80 IU/L (ref 44–121)
BUN/Creatinine Ratio: 24 (ref 12–28)
BUN: 15 mg/dL (ref 8–27)
Bilirubin Total: <0.2 mg/dL (ref 0.0–1.2)
CO2: 22 mmol/L (ref 20–29)
Calcium: 9.1 mg/dL (ref 8.7–10.3)
Chloride: 100 mmol/L (ref 96–106)
Creatinine, Ser: 0.63 mg/dL (ref 0.57–1.00)
Globulin, Total: 2.2 g/dL (ref 1.5–4.5)
Glucose: 91 mg/dL (ref 65–99)
Potassium: 4.8 mmol/L (ref 3.5–5.2)
Sodium: 136 mmol/L (ref 134–144)
Total Protein: 6.2 g/dL (ref 6.0–8.5)
eGFR: 97 mL/min/{1.73_m2} (ref >59)

## 2020-11-09 LAB — TSH: TSH: 3.49 u[IU]/mL (ref 0.450–4.500)

## 2020-11-09 LAB — VITAMIN B12: Vitamin B-12: 199 pg/mL — ABNORMAL LOW (ref 232–1245)

## 2020-11-09 NOTE — Assessment & Plan Note (Signed)
Doing well. Continue current meds 

## 2020-11-09 NOTE — Progress Notes (Signed)
    CHIEF COMPLAINT / HPI:  1. New numbness in her feet. Also some foot pain but that is seaprate and she has had it for a while---over bottom part when ]she stands. Mostly over [place] head area. Had some orthotics made but they did not seem to help 2. Thinks it may be time to do her remaining knee replacement 3. Continues on her antidepressants without problem. Mood seems stable. No problems 4. HTN: taking meds and no LE edema, no chest pains, no headaches.   PERTINENT  PMH / PSH: I have reviewed the patient's medications, allergies, past medical and surgical history, smoking status and updated in the EMR as appropriate.   OBJECTIVE:  BP (!) 96/42   Pulse 75   Wt 174 lb (78.9 kg)   SpO2 100%   BMI 35.75 kg/m  Vital signs reviewed. GENERAL: Well-developed, well-nourished, no acute distress. CARDIOVASCULAR: Regular rate and rhythm no murmur gallop or rub LUNGS: Clear to auscultation bilaterally, no rales or wheeze. ABDOMEN: Soft positive bowel sounds NEURO: No gross focal neurological deficits but does have some decrease in soft touch and sharp touch sensation in patchy areas of her free, both plantar and dorsal surface, not dermatomal. MSK: Movement of extremity x 4. Normal muscle bulk and tone PSYCH: AxOx4. Good eye contact.. No psychomotor retardation or agitation. Appropriate speech fluency and content. Asks and answers questions appropriately. Mood is congruent.    ASSESSMENT / PLAN:   Essential hypertension Continue current meds Labs today   Dysthymia Doing well Continue current meds  Chronic anticoagulation Had to switch back to warfarin as she cannot a\fford DOAC at this time. INR check and adjust meds today w f/u 2 weeks  Metatarsalgia of both feet Bring orthotics in for adjustment.   Neuropathy Patchy areas on feet. Check labs. Try gabapentin and f/u 4-6 weeks   Dorcas Mcmurray MD

## 2020-11-09 NOTE — Assessment & Plan Note (Signed)
Patchy areas on feet. Check labs. Try gabapentin and f/u 4-6 weeks

## 2020-11-09 NOTE — Assessment & Plan Note (Signed)
Continue current meds Labs today

## 2020-11-09 NOTE — Assessment & Plan Note (Signed)
Bring orthotics in for adjustment.

## 2020-11-09 NOTE — Assessment & Plan Note (Signed)
Had to switch back to warfarin as she cannot a\fford DOAC at this time. INR check and adjust meds today w f/u 2 weeks

## 2020-11-10 ENCOUNTER — Encounter: Payer: Self-pay | Admitting: Family Medicine

## 2020-11-10 ENCOUNTER — Other Ambulatory Visit: Payer: Self-pay | Admitting: Family Medicine

## 2020-11-10 NOTE — Progress Notes (Unsigned)
Dear RN team She may be calling to schedule a nurse visit for a B 12 shot. I tried to oput in a standing order for that but I have forgotten how. mI will get Janett Billow to show me next week but if she calls before then, let me know and it is OK to give her 1000 mcg of B 12 injectable. THANKS! Dorcas Mcmurray

## 2020-11-14 ENCOUNTER — Other Ambulatory Visit: Payer: Self-pay | Admitting: Family Medicine

## 2020-11-22 ENCOUNTER — Ambulatory Visit (INDEPENDENT_AMBULATORY_CARE_PROVIDER_SITE_OTHER): Payer: Medicare Other | Admitting: *Deleted

## 2020-11-22 ENCOUNTER — Other Ambulatory Visit: Payer: Self-pay

## 2020-11-22 DIAGNOSIS — Z7901 Long term (current) use of anticoagulants: Secondary | ICD-10-CM

## 2020-11-22 LAB — POCT INR: INR: 2 (ref 2.0–3.0)

## 2020-11-23 DIAGNOSIS — M25561 Pain in right knee: Secondary | ICD-10-CM | POA: Diagnosis not present

## 2020-11-28 ENCOUNTER — Encounter: Payer: Self-pay | Admitting: Family Medicine

## 2020-11-29 ENCOUNTER — Other Ambulatory Visit: Payer: Self-pay | Admitting: Family Medicine

## 2020-11-29 DIAGNOSIS — D509 Iron deficiency anemia, unspecified: Secondary | ICD-10-CM

## 2020-11-29 DIAGNOSIS — G629 Polyneuropathy, unspecified: Secondary | ICD-10-CM

## 2020-11-29 MED ORDER — GABAPENTIN 100 MG PO CAPS: ORAL_CAPSULE | ORAL | 3 refills | Status: DC

## 2020-12-06 ENCOUNTER — Ambulatory Visit (INDEPENDENT_AMBULATORY_CARE_PROVIDER_SITE_OTHER): Payer: Medicare Other | Admitting: *Deleted

## 2020-12-06 ENCOUNTER — Other Ambulatory Visit: Payer: Self-pay

## 2020-12-06 DIAGNOSIS — Z7901 Long term (current) use of anticoagulants: Secondary | ICD-10-CM

## 2020-12-06 DIAGNOSIS — D509 Iron deficiency anemia, unspecified: Secondary | ICD-10-CM

## 2020-12-06 LAB — POCT INR: INR: 2.7 (ref 2.0–3.0)

## 2020-12-07 ENCOUNTER — Encounter: Payer: Self-pay | Admitting: Family Medicine

## 2020-12-07 LAB — CBC
Hematocrit: 32.4 % — ABNORMAL LOW (ref 34.0–46.6)
Hemoglobin: 9.8 g/dL — ABNORMAL LOW (ref 11.1–15.9)
MCH: 22.1 pg — ABNORMAL LOW (ref 26.6–33.0)
MCHC: 30.2 g/dL — ABNORMAL LOW (ref 31.5–35.7)
MCV: 73 fL — ABNORMAL LOW (ref 79–97)
Platelets: 270 10*3/uL (ref 150–450)
RBC: 4.44 x10E6/uL (ref 3.77–5.28)
RDW: 17.6 % — ABNORMAL HIGH (ref 11.7–15.4)
WBC: 5 10*3/uL (ref 3.4–10.8)

## 2020-12-10 ENCOUNTER — Other Ambulatory Visit: Payer: Self-pay | Admitting: Family Medicine

## 2020-12-12 ENCOUNTER — Ambulatory Visit: Payer: Medicare Other

## 2020-12-12 ENCOUNTER — Encounter: Payer: Self-pay | Admitting: Family Medicine

## 2020-12-19 ENCOUNTER — Ambulatory Visit (INDEPENDENT_AMBULATORY_CARE_PROVIDER_SITE_OTHER): Payer: Medicare Other | Admitting: *Deleted

## 2020-12-19 ENCOUNTER — Other Ambulatory Visit: Payer: Self-pay

## 2020-12-19 ENCOUNTER — Ambulatory Visit (INDEPENDENT_AMBULATORY_CARE_PROVIDER_SITE_OTHER): Payer: Medicare Other

## 2020-12-19 DIAGNOSIS — Z7901 Long term (current) use of anticoagulants: Secondary | ICD-10-CM

## 2020-12-19 DIAGNOSIS — E538 Deficiency of other specified B group vitamins: Secondary | ICD-10-CM

## 2020-12-19 DIAGNOSIS — Z23 Encounter for immunization: Secondary | ICD-10-CM | POA: Diagnosis not present

## 2020-12-19 LAB — POCT INR: INR: 2.4 (ref 2.0–3.0)

## 2020-12-19 MED ORDER — CYANOCOBALAMIN 1000 MCG/ML IJ SOLN
1000.0000 ug | Freq: Once | INTRAMUSCULAR | Status: AC
  Administered 2020-12-19: 1000 ug via INTRAMUSCULAR

## 2020-12-19 NOTE — Progress Notes (Signed)
Patients presents in nurse clinic for B12 injection .   Injection given LD without complication.

## 2020-12-20 ENCOUNTER — Ambulatory Visit: Payer: Medicare Other

## 2020-12-27 DIAGNOSIS — M25561 Pain in right knee: Secondary | ICD-10-CM | POA: Insufficient documentation

## 2020-12-29 DIAGNOSIS — M25561 Pain in right knee: Secondary | ICD-10-CM | POA: Diagnosis not present

## 2021-01-02 ENCOUNTER — Other Ambulatory Visit: Payer: Self-pay

## 2021-01-02 ENCOUNTER — Other Ambulatory Visit: Payer: Self-pay | Admitting: Family Medicine

## 2021-01-02 ENCOUNTER — Encounter: Payer: Self-pay | Admitting: Family Medicine

## 2021-01-02 ENCOUNTER — Ambulatory Visit (INDEPENDENT_AMBULATORY_CARE_PROVIDER_SITE_OTHER): Payer: Medicare Other | Admitting: *Deleted

## 2021-01-02 DIAGNOSIS — Z7901 Long term (current) use of anticoagulants: Secondary | ICD-10-CM | POA: Diagnosis not present

## 2021-01-02 DIAGNOSIS — D649 Anemia, unspecified: Secondary | ICD-10-CM

## 2021-01-02 LAB — POCT INR: INR: 2 (ref 2.0–3.0)

## 2021-01-02 MED ORDER — WARFARIN SODIUM 2 MG PO TABS: 6.0000 mg | ORAL_TABLET | Freq: Every day | ORAL | 3 refills | Status: DC

## 2021-01-09 ENCOUNTER — Other Ambulatory Visit: Payer: Self-pay

## 2021-01-09 ENCOUNTER — Ambulatory Visit (INDEPENDENT_AMBULATORY_CARE_PROVIDER_SITE_OTHER): Payer: Medicare Other | Admitting: *Deleted

## 2021-01-09 DIAGNOSIS — Z7901 Long term (current) use of anticoagulants: Secondary | ICD-10-CM

## 2021-01-09 LAB — POCT INR: INR: 2.1 (ref 2.0–3.0)

## 2021-01-24 ENCOUNTER — Encounter: Payer: Self-pay | Admitting: Family Medicine

## 2021-01-30 ENCOUNTER — Other Ambulatory Visit: Payer: Self-pay

## 2021-01-30 ENCOUNTER — Ambulatory Visit (INDEPENDENT_AMBULATORY_CARE_PROVIDER_SITE_OTHER): Payer: Medicare Other | Admitting: *Deleted

## 2021-01-30 DIAGNOSIS — Z7901 Long term (current) use of anticoagulants: Secondary | ICD-10-CM | POA: Diagnosis not present

## 2021-01-30 LAB — POCT INR: INR: 2.2 (ref 2.0–3.0)

## 2021-02-02 DIAGNOSIS — H5213 Myopia, bilateral: Secondary | ICD-10-CM | POA: Diagnosis not present

## 2021-02-02 DIAGNOSIS — H2513 Age-related nuclear cataract, bilateral: Secondary | ICD-10-CM | POA: Diagnosis not present

## 2021-02-02 DIAGNOSIS — H524 Presbyopia: Secondary | ICD-10-CM | POA: Diagnosis not present

## 2021-02-02 DIAGNOSIS — H52223 Regular astigmatism, bilateral: Secondary | ICD-10-CM | POA: Diagnosis not present

## 2021-02-10 ENCOUNTER — Emergency Department (HOSPITAL_COMMUNITY): Payer: Medicare Other

## 2021-02-10 ENCOUNTER — Other Ambulatory Visit: Payer: Self-pay

## 2021-02-10 ENCOUNTER — Emergency Department (HOSPITAL_COMMUNITY)
Admission: EM | Admit: 2021-02-10 | Discharge: 2021-02-10 | Disposition: A | Discharge status: Home / Self Care | Payer: Medicare Other | Attending: Emergency Medicine | Admitting: Emergency Medicine

## 2021-02-10 DIAGNOSIS — J9811 Atelectasis: Secondary | ICD-10-CM | POA: Diagnosis not present

## 2021-02-10 DIAGNOSIS — R911 Solitary pulmonary nodule: Secondary | ICD-10-CM | POA: Diagnosis not present

## 2021-02-10 DIAGNOSIS — R531 Weakness: Secondary | ICD-10-CM | POA: Diagnosis not present

## 2021-02-10 DIAGNOSIS — I10 Essential (primary) hypertension: Secondary | ICD-10-CM | POA: Diagnosis not present

## 2021-02-10 DIAGNOSIS — Z96652 Presence of left artificial knee joint: Secondary | ICD-10-CM | POA: Insufficient documentation

## 2021-02-10 DIAGNOSIS — R079 Chest pain, unspecified: Secondary | ICD-10-CM | POA: Diagnosis not present

## 2021-02-10 DIAGNOSIS — R600 Localized edema: Secondary | ICD-10-CM | POA: Insufficient documentation

## 2021-02-10 DIAGNOSIS — Z79899 Other long term (current) drug therapy: Secondary | ICD-10-CM | POA: Diagnosis not present

## 2021-02-10 DIAGNOSIS — R0789 Other chest pain: Secondary | ICD-10-CM | POA: Diagnosis not present

## 2021-02-10 DIAGNOSIS — Z7901 Long term (current) use of anticoagulants: Secondary | ICD-10-CM | POA: Diagnosis not present

## 2021-02-10 DIAGNOSIS — Z96611 Presence of right artificial shoulder joint: Secondary | ICD-10-CM | POA: Insufficient documentation

## 2021-02-10 DIAGNOSIS — Z743 Need for continuous supervision: Secondary | ICD-10-CM | POA: Diagnosis not present

## 2021-02-10 LAB — CBC WITH DIFFERENTIAL/PLATELET
Abs Immature Granulocytes: 0.02 10*3/uL (ref 0.00–0.07)
Basophils Absolute: 0 10*3/uL (ref 0.0–0.1)
Basophils Relative: 0 %
Eosinophils Absolute: 0 10*3/uL (ref 0.0–0.5)
Eosinophils Relative: 0 %
HCT: 42.4 % (ref 36.0–46.0)
Hemoglobin: 13.1 g/dL (ref 12.0–15.0)
Immature Granulocytes: 0 %
Lymphocytes Relative: 40 %
Lymphs Abs: 2 10*3/uL (ref 0.7–4.0)
MCH: 25.8 pg — ABNORMAL LOW (ref 26.0–34.0)
MCHC: 30.9 g/dL (ref 30.0–36.0)
MCV: 83.5 fL (ref 80.0–100.0)
Monocytes Absolute: 0.6 10*3/uL (ref 0.1–1.0)
Monocytes Relative: 12 %
Neutro Abs: 2.4 10*3/uL (ref 1.7–7.7)
Neutrophils Relative %: 48 %
Platelets: 198 10*3/uL (ref 150–400)
RBC: 5.08 MIL/uL (ref 3.87–5.11)
RDW: 19.2 % — ABNORMAL HIGH (ref 11.5–15.5)
WBC: 5.1 10*3/uL (ref 4.0–10.5)
nRBC: 0 % (ref 0.0–0.2)

## 2021-02-10 LAB — BASIC METABOLIC PANEL
Anion gap: 9 (ref 5–15)
BUN: 23 mg/dL (ref 8–23)
CO2: 25 mmol/L (ref 22–32)
Calcium: 8.7 mg/dL — ABNORMAL LOW (ref 8.9–10.3)
Chloride: 101 mmol/L (ref 98–111)
Creatinine, Ser: 0.79 mg/dL (ref 0.44–1.00)
GFR, Estimated: >60 mL/min (ref >60)
Glucose, Bld: 98 mg/dL (ref 70–99)
Potassium: 4.2 mmol/L (ref 3.5–5.1)
Sodium: 135 mmol/L (ref 135–145)

## 2021-02-10 LAB — TROPONIN I (HIGH SENSITIVITY)
Troponin I (High Sensitivity): 4 ng/L (ref <18)
Troponin I (High Sensitivity): 5 ng/L (ref <18)

## 2021-02-10 LAB — BRAIN NATRIURETIC PEPTIDE: B Natriuretic Peptide: 10.5 pg/mL (ref 0.0–100.0)

## 2021-02-10 LAB — PROTIME-INR
INR: 2.3 — ABNORMAL HIGH (ref 0.8–1.2)
Prothrombin Time: 25.3 seconds — ABNORMAL HIGH (ref 11.4–15.2)

## 2021-02-10 MED ORDER — IOPAMIDOL (ISOVUE-370) INJECTION 76%
50.0000 mL | Freq: Once | INTRAVENOUS | Status: AC | PRN
  Administered 2021-02-10: 06:00:00 50 mL via INTRAVENOUS

## 2021-02-10 NOTE — Discharge Instructions (Signed)
You were seen today for chest pain.  Your work-up is reassuring including repeat evaluation for PE.  Follow-up with cardiology for definitive cardiac testing.

## 2021-02-10 NOTE — ED Provider Notes (Signed)
[facility] EMERGENCY DEPARTMENT Provider Note   CSN: [id number] Arrival date & time: 02/10/21  0349     History Chief Complaint  Patient presents with   Chest Pain    [name] is a 70 y.o. female.  HPI     This is a 70 year old female with a history of PE, hypertension who presents with chest discomfort.  Patient reports that she woke up this morning around 2 AM with a "hollow feeling in my chest like I had when I had my pulmonary embolism."  She states that the symptoms are hard to describe but they are anterior nonradiating.  No significant shortness of breath.  No recent fevers or cough.  Patient states that she has been compliant with her Coumadin and was therapeutic at her last check.  She has also noted some lower extremity rash.  No worsening swelling.  Past Medical History:  Diagnosis Date   Anemia    Anxiety    Arthritis    Depression    Dysrhythmia    occ due to caffeine intake   Encounter for long-term (current) use of other medications    GERD (gastroesophageal reflux disease)    Hepatitis    History of hiatal hernia    Hypertension    IBS (irritable bowel syndrome)    Obesity    Osteoarthritis    Pain in joint, multiple sites    Pneumonia    PONV (postoperative nausea and vomiting)    in the 1980's no problems since   Psoriatic arthropathy (HCC)    Pulmonary embolism ([place]) 12/03/2016   submassive/notes 12/03/2016   Raynaud disease     Patient Active Problem List   Diagnosis Date Noted   Neuropathy 11/09/2020   Fall 04/19/2019   Metatarsalgia of both feet 08/30/2018   Gait abnormality 08/21/2018   Chronic anticoagulation 06/24/2017   Collagenous colitis 05/07/2017   Hx vasculitis leukocytoclastic 2018 05/07/2017   GAD (generalized anxiety disorder)    S/P shoulder replacement, right 06/28/2016   Vitamin D deficiency 12/05/2015   Vitamin B 12 deficiency 12/05/2015   AC (acromioclavicular) joint arthritis 08/29/2015    Well adult health check 09/01/2013   Hammertoe 11/20/2012   Psoriatic arthritis ([place]) 04/10/2012   Pulmonary nodule seen on imaging study 11/08/2011   Dysthymia 12/12/2010   Essential hypertension 04/27/2008   Enthesopathy of ankle and tarsus 10/02/2006   KNEE REPLACEMENT, LEFT, HX OF 07/03/2006   OBESITY, NOS 04/17/2006   PANIC ATTACKS 04/17/2006   GASTROESOPHAGEAL REFLUX, NO ESOPHAGITIS 04/17/2006   CHOLELITHIASIS, NOS 04/17/2006   VAGINITIS, ATROPHIC 04/17/2006    Past Surgical History:  Procedure Laterality Date   ABDOMINAL HYSTERECTOMY     COLONOSCOPY     GASTRIC BYPASS     HALLUX VALGUS CORRECTION  09/2010   Dr Arnetha Courser; right 1st ray   HERNIA REPAIR     JOINT REPLACEMENT     left knee times 3   LAPAROSCOPIC CHOLECYSTECTOMY  04/2000   /notes 07/03/2010   LAPAROSCOPIC INCISIONAL / UMBILICAL / VENTRAL HERNIA REPAIR  09/2002   VHR/notes 07/03/2010   REVISION TOTAL KNEE ARTHROPLASTY Left 10/2003   Archie Endo 07/03/2010   REVISION TOTAL KNEE ARTHROPLASTY Left 02/2007   Archie Endo 06/21/2010   SHOULDER HEMI-ARTHROPLASTY Right 06/28/2016   Procedure: RIGHT SHOULDER HEMI-ARTHROPLASTY;  Surgeon: Netta Cedars, MD;  Location: [facility];  Service: Orthopedics;  Laterality: Right;   TOTAL KNEE ARTHROPLASTY Left 10/1999   Archie Endo 07/03/2010   TUBAL LIGATION  OB History   No obstetric history on file.     Family History  Problem Relation Age of Onset   Depression Mother    Obesity Father    Deep vein thrombosis Father    Arthritis Maternal Grandmother    Arthritis Maternal Grandfather    Arthritis Paternal Grandmother    Arthritis Paternal Grandfather    Breast cancer Neg Hx     Social History   Tobacco Use   Smoking status: Never   Smokeless tobacco: Never  Vaping Use   Vaping Use: Never used  Substance Use Topics   Alcohol use: No   Drug use: No    Home Medications Prior to Admission medications   Medication Sig Start Date End Date Taking? Authorizing Provider   ALPRAZolam (XANAX) 0.5 MG tablet TAKE 1 TABLET BY MOUTH 3  TIMES DAILY AS NEEDED FOR  ANXIETY Patient taking differently: Take 1 mg by mouth at bedtime. 10/12/20  Yes [name], MD  benazepril (LOTENSIN) 20 MG tablet TAKE 1 TABLET BY MOUTH  DAILY Patient taking differently: Take 20 mg by mouth daily. 12/11/20  Yes [name], MD  Calcium-Magnesium-Vitamin D (CALCIUM 1200+D3 PO) Take 1 tablet by mouth daily.   Yes [provider]  Chlorpheniramine-DM (CORICIDIN HBP COUGH/COLD PO) Take 1 tablet by mouth 2 (two) times daily as needed (cold/cough).   Yes [provider]  diclofenac (VOLTAREN) 75 MG EC tablet TAKE 2 TABLETS BY MOUTH  ONCE DAILY AS NEEDED FOR  JOINT PAIN Patient taking differently: Take 75 mg by mouth 2 (two) times daily. 11/15/20  Yes [name], MD  esomeprazole (Damon) 20 MG capsule TAKE 1 CAPSULE(20 MG) BY MOUTH DAILY Patient taking differently: Take 20 mg by mouth daily. 02/12/17  Yes [name], MD  fluconazole (DIFLUCAN) 100 MG tablet TAKE 1 TABLET BY MOUTH ONCE WEEKLY Patient taking differently: Take 100 mg by mouth every Monday. 06/05/20  Yes [name], MD  furosemide (LASIX) 20 MG tablet TAKE 2 TABLETS BY MOUTH IN  THE MORNING . MAY TAKE 1  TABLET BY MOUTH IN THE  AFTERNOON AS ADDITIONAL  DOSE AS NEEDED Patient taking differently: Take 20-40 mg by mouth See admin instructions. 40 mg in the morning 20 mg in the afternoon as needed for fluid 06/05/20  Yes [name], MD  gabapentin (NEURONTIN) 100 MG capsule Take one or two tablets at night as directed Patient taking differently: Take 100 mg by mouth 2 (two) times daily. Take one or two tablets at night as directed 11/29/20  Yes [name], MD  ibuprofen (ADVIL) 200 MG tablet Take 200 mg by mouth every 6 (six) hours as needed for mild pain or headache.   Yes [provider]  venlafaxine XR (EFFEXOR-XR) 150 MG 24 hr capsule TAKE 1 CAPSULE BY MOUTH  DAILY Patient taking differently: Take  150 mg by mouth daily with breakfast. Along with 75 mg capsule 12/11/20  Yes [name], MD  venlafaxine XR (EFFEXOR-XR) 75 MG 24 hr capsule TAKE 1 CAPSULE BY MOUTH  DAILY IN ADDITION TO 150 MG CAPSULE FOR 225 MG DAILY Patient taking differently: Take 75 mg by mouth daily with breakfast. Along with 150 mg capsule 12/11/20  Yes [name], MD  warfarin (COUMADIN) 2 MG tablet Take 3 tablets (6 mg total) by mouth daily. 01/02/21  Yes [name], MD    Allergies    Augmentin [amoxicillin-pot clavulanate], Vioxx [rofecoxib], Bactrim [sulfamethoxazole-trimethoprim],  Banana, Cephalosporins, Ciprofloxacin, Coconut flavor, Codeine, and Zithromax [azithromycin]  Review of Systems   Review of Systems  Constitutional:  Negative for fever.  Respiratory:  Negative for cough and shortness of breath.   Cardiovascular:  Positive for chest pain.  Gastrointestinal:  Negative for abdominal pain, nausea and vomiting.  Skin:  Positive for rash.  All other systems reviewed and are negative.  Physical Exam Updated Vital Signs BP 127/60    Pulse 64    Temp (!) 97.5 F (36.4 C)    Resp 12    Ht 1.499 m (4\' 11" )    Wt 78 kg    SpO2 99%    BMI 34.74 kg/m   Physical Exam Vitals and nursing note reviewed.  Constitutional:      Appearance: She is well-developed. She is not ill-appearing.  HENT:     Head: Normocephalic and atraumatic.  Eyes:     Pupils: Pupils are equal, round, and reactive to light.  Cardiovascular:     Rate and Rhythm: Normal rate and regular rhythm.     Heart sounds: Normal heart sounds.  Pulmonary:     Effort: Pulmonary effort is normal. No respiratory distress.     Breath sounds: No wheezing.  Abdominal:     General: Bowel sounds are normal.     Palpations: Abdomen is soft.  Musculoskeletal:     Cervical back: Neck supple.     Comments: Trace bilateral lower extremity edema, faint petechiae noted bilateral lower extremities up to the shin  Skin:    General: Skin is warm and  dry.  Neurological:     Mental Status: She is alert and oriented to person, place, and time.  Psychiatric:        Mood and Affect: Mood normal.    ED Results / Procedures / Treatments   Labs (all labs ordered are listed, but only abnormal results are displayed) Labs Reviewed  CBC WITH DIFFERENTIAL/PLATELET - Abnormal; Notable for the following components:      Result Value   MCH 25.8 (*)    RDW 19.2 (*)    All other components within normal limits  BASIC METABOLIC PANEL - Abnormal; Notable for the following components:   Calcium 8.7 (*)    All other components within normal limits  PROTIME-INR - Abnormal; Notable for the following components:   Prothrombin Time 25.3 (*)    INR 2.3 (*)    All other components within normal limits  BRAIN NATRIURETIC PEPTIDE  TROPONIN I (HIGH SENSITIVITY)  TROPONIN I (HIGH SENSITIVITY)    EKG EKG Interpretation  Date/Time:  Saturday February 10 2021 03:59:25 EST Ventricular Rate:  68 PR Interval:  157 QRS Duration: 94 QT Interval:  410 QTC Calculation: 436 R Axis:   22 Text Interpretation: Sinus rhythm Abnormal R-wave progression, early transition Confirmed by Thayer Jew [phone]) on 02/10/2021 6:04:34 AM  Radiology CT Angio Chest PE W and/or Wo Contrast  Result Date: 02/10/2021 CLINICAL DATA:  70 year old female woke by severe chest pain. Diaphoresis. EXAM: CT ANGIOGRAPHY CHEST WITH CONTRAST TECHNIQUE: Multidetector CT imaging of the chest was performed using the standard protocol during bolus administration of intravenous contrast. Multiplanar CT image reconstructions and MIPs were obtained to evaluate the vascular anatomy. CONTRAST:  34mL ISOVUE-370 IOPAMIDOL (ISOVUE-370) INJECTION 76% COMPARISON:  CTA chest 01/27/2018. FINDINGS: Cardiovascular: Excellent contrast bolus timing in the pulmonary arterial tree. Mild respiratory motion. No focal filling defect identified in the pulmonary arteries to suggest acute pulmonary embolism.  Calcified coronary  artery atherosclerosis (series 8, image 145). No cardiomegaly or pericardial effusion. Little to no contrast in the aorta. Mild calcified aortic atherosclerosis. Mediastinum/Nodes: Negative. No mediastinal mass or lymphadenopathy. Lungs/Pleura: Stable lung volumes to 2019. Major airways are patent. A small right upper lobe lung nodule on series 7, image 50 is stable and benign. Mild lung base atelectasis also similar to 2019. No pleural effusion or acute pulmonary abnormality. Upper Abdomen: Chronic metallic foci in the upper abdomen about the stomach and spleen might be surgical clips and/or embolic material. Chronic postoperative changes to the stomach and surgically absent gallbladder. Stable visible liver, spleen, bowel and kidneys. Musculoskeletal: Chronic right shoulder arthroplasty. Osteopenia with widespread thoracic disc and endplate degeneration. Occasional mild thoracic compression fractures including the T3 superior endplate are stable since 2019. No acute osseous abnormality identified. Review of the MIP images confirms the above findings. IMPRESSION: 1. Negative for acute pulmonary embolus. 2. Calcified coronary artery atherosclerosis. Mild aortic atherosclerosis. 3. No acute pulmonary abnormality. Electronically Signed   By: Genevie Ann M.D.   On: 02/10/2021 06:01    Procedures Procedures   Medications Ordered in ED Medications  iopamidol (ISOVUE-370) 76 % injection 50 mL (50 mLs Intravenous Contrast Given 02/10/21 0550)    ED Course  I have reviewed the triage vital signs and the nursing notes.  Pertinent labs & imaging results that were available during my care of the patient were reviewed by me and considered in my medical decision making (see chart for details).    MDM Rules/Calculators/A&P                           Patient presents with chest pain.  She is nontoxic and vital signs are largely reassuring.  She has a history of PE but reports that she is compliant  with her Coumadin.  This is a consideration as is ACS although her story is very atypical.  EKG shows no evidence of acute ischemic or arrhythmic changes.  It is very reassuring.  Initial troponin is negative.  We will repeat given timing.  Labs otherwise obtained and thus far largely reassuring.  CT scan of the chest obtained and does not show any residual or clinically significant pulmonary embolism.  Patient signed out to oncoming provider.     Final Clinical Impression(s) / ED Diagnoses Final diagnoses:  Atypical chest pain    Rx / DC Orders ED Discharge Orders     None        Osmany Azer, Barbette Hair, MD 02/10/21 [phone]

## 2021-02-10 NOTE — ED Triage Notes (Signed)
Pt from home via EMS for eval of crushing chest pain that woke her out of sleep. Had associated diaphoresis. Pt got out of bed and pain subsided but has residual shortness of breath. Endorses intermittent minor chest pain since it initially subsided. Hx of PE with same symptoms. Reports swelling to BLE is normal.

## 2021-02-10 NOTE — ED Notes (Signed)
Reviewed discharge instructions with patient. Follow-up care reviewed. Patient verbalized understanding. Patient A&Ox4, VSS, and ambulatory with steady gait upon discharge.  

## 2021-02-10 NOTE — ED Provider Notes (Signed)
Pt's repeat troponin is negative.  Pt is feeling well.  She is stable for d/c.  Return if worse.    Isla Pence, MD 02/10/21 [phone]

## 2021-02-13 NOTE — H&P (Addendum)
TOTAL KNEE ADMISSION H&P  Patient is being admitted for right total knee arthroplasty.  Subjective:  Chief Complaint: Right knee pain.  HPI: [name], 70 y.o. female has a history of pain and functional disability in the right knee due to arthritis and has failed non-surgical conservative treatments for greater than 12 weeks to include corticosteriod injections and activity modification. Onset of symptoms was gradual, starting  several  years ago with gradually worsening course since that time. The patient noted no past surgery on the right knee.  Patient currently rates pain in the right knee at 8 out of 10 with activity. Patient has worsening of pain with activity and weight bearing, pain that interferes with activities of daily living, crepitus, and joint swelling. Patient has evidence of  bone-on-bone arthritis in the lateral compartment and near bone-on-bone arthritis in the patellofemoral compartment with large patellofemoral spurs  by imaging studies. There is no active infection.  Patient Active Problem List   Diagnosis Date Noted   Neuropathy 11/09/2020   Fall 04/19/2019   Metatarsalgia of both feet 08/30/2018   Gait abnormality 08/21/2018   Chronic anticoagulation 06/24/2017   Collagenous colitis 05/07/2017   Hx vasculitis leukocytoclastic 2018 05/07/2017   GAD (generalized anxiety disorder)    S/P shoulder replacement, right 06/28/2016   Vitamin D deficiency 12/05/2015   Vitamin B 12 deficiency 12/05/2015   AC (acromioclavicular) joint arthritis 08/29/2015   Well adult health check 09/01/2013   Hamm[place]oe 11/20/2012   Psoriatic arthritis ([place]) 04/10/2012   Pulmonary nodule seen on imaging study 11/08/2011   Dysthymia 12/12/2010   Essential hypertension 04/27/2008   Enthesopathy of ankle and tarsus 10/02/2006   KNEE REPLACEMENT, LEFT, HX OF 07/03/2006   OBESITY, NOS 04/17/2006   PANIC ATTACKS 04/17/2006   GASTROESOPHAGEAL REFLUX, NO ESOPHAGITIS 04/17/2006    CHOLELITHIASIS, NOS 04/17/2006   VAGINITIS, ATROPHIC 04/17/2006    Past Medical History:  Diagnosis Date   Anemia    Anxiety    Arthritis    Depression    Dysrhythmia    occ due to caffeine intake   Encounter for long-term (current) use of other medications    GERD (gastroesophageal reflux disease)    Hepatitis    History of hiatal hernia    Hypertension    IBS (irritable bowel syndrome)    Obesity    Osteoarthritis    Pain in joint, multiple sites    Pneumonia    PONV (postoperative nausea and vomiting)    in the 1980's no problems since   Psoriatic arthropathy ([place])    Pulmonary embolism ([place]) 12/03/2016   submassive/notes 12/03/2016   Raynaud disease     Past Surgical History:  Procedure Laterality Date   ABDOMINAL HYSTERECTOMY     COLONOSCOPY     GASTRIC BYPASS     HALLUX VALGUS CORRECTION  09/2010   Dr Arnetha Courser; right 1st ray   HERNIA REPAIR     JOINT REPLACEMENT     left knee times 3   LAPAROSCOPIC CHOLECYSTECTOMY  04/2000   /notes 07/03/2010   LAPAROSCOPIC INCISIONAL / UMBILICAL / VENTRAL HERNIA REPAIR  09/2002   VHR/notes 07/03/2010   REVISION TOTAL KNEE ARTHROPLASTY Left 10/2003   Archie Endo 07/03/2010   REVISION TOTAL KNEE ARTHROPLASTY Left 02/2007   Archie Endo 06/21/2010   SHOULDER HEMI-ARTHROPLASTY Right 06/28/2016   Procedure: RIGHT SHOULDER HEMI-ARTHROPLASTY;  Surgeon: Netta Cedars, MD;  Location: [facility];  Service: Orthopedics;  Laterality: Right;   TOTAL KNEE ARTHROPLASTY Left 10/1999   Archie Endo  07/03/2010   TUBAL LIGATION      Prior to Admission medications   Medication Sig Start Date End Date Taking? Authorizing Provider  ALPRAZolam (XANAX) 0.5 MG tablet TAKE 1 TABLET BY MOUTH 3  TIMES DAILY AS NEEDED FOR  ANXIETY Patient taking differently: Take 1 mg by mouth at bedtime. 10/12/20   [name], MD  benazepril (LOTENSIN) 20 MG tablet TAKE 1 TABLET BY MOUTH  DAILY Patient taking differently: Take 20 mg by mouth daily. 12/11/20   [name], MD   Calcium-Magnesium-Vitamin D (CALCIUM 1200+D3 PO) Take 1 tablet by mouth daily.    [provider]  Chlorpheniramine-DM (CORICIDIN HBP COUGH/COLD PO) Take 1 tablet by mouth 2 (two) times daily as needed (cold/cough).    [provider]  diclofenac (VOLTAREN) 75 MG EC tablet TAKE 2 TABLETS BY MOUTH  ONCE DAILY AS NEEDED FOR  JOINT PAIN Patient taking differently: Take 75 mg by mouth 2 (two) times daily. 11/15/20   [name], MD  esomeprazole ([place]) 20 MG capsule TAKE 1 CAPSULE(20 MG) BY MOUTH DAILY Patient taking differently: Take 20 mg by mouth daily. 02/12/17   [name], MD  fluconazole (DIFLUCAN) 100 MG tablet TAKE 1 TABLET BY MOUTH ONCE WEEKLY Patient taking differently: Take 100 mg by mouth every Monday. 06/05/20   [name], MD  furosemide (LASIX) 20 MG tablet TAKE 2 TABLETS BY MOUTH IN  THE MORNING . MAY TAKE 1  TABLET BY MOUTH IN THE  AFTERNOON AS ADDITIONAL  DOSE AS NEEDED Patient taking differently: Take 20-40 mg by mouth See admin instructions. 40 mg in the morning 20 mg in the afternoon as needed for fluid 06/05/20   [name], MD  gabapentin (NEURONTIN) 100 MG capsule Take one or two tablets at night as directed Patient taking differently: Take 100 mg by mouth 2 (two) times daily. Take one or two tablets at night as directed 11/29/20   [name], MD  ibuprofen (ADVIL) 200 MG tablet Take 200 mg by mouth every 6 (six) hours as needed for mild pain or headache.    [provider]  venlafaxine XR (EFFEXOR-XR) 150 MG 24 hr capsule TAKE 1 CAPSULE BY MOUTH  DAILY Patient taking differently: Take 150 mg by mouth daily with breakfast. Along with 75 mg capsule 12/11/20   [name], MD  venlafaxine XR (EFFEXOR-XR) 75 MG 24 hr capsule TAKE 1 CAPSULE BY MOUTH  DAILY IN ADDITION TO 150 MG CAPSULE FOR 225 MG DAILY Patient taking differently: Take 75 mg by mouth daily with breakfast. Along with 150 mg capsule 12/11/20   [name], MD  warfarin (COUMADIN)  2 MG tablet Take 3 tablets (6 mg total) by mouth daily. 01/02/21   [name], MD    Allergies  Allergen Reactions   Augmentin [Amoxicillin-Pot Clavulanate] Diarrhea   Vioxx [Rofecoxib] Swelling    EXTREME LEG SWELLING    Bactrim [Sulfamethoxazole-Trimethoprim] Diarrhea and Nausea And Vomiting   Banana Itching and Rash   Cephalosporins Diarrhea, Nausea And Vomiting and Other (See Comments)    Cephalexin doses > 250mg  cause abdominal pain   Ciprofloxacin Diarrhea and Nausea And Vomiting   Coconut Flavor Itching and Rash   Codeine Nausea Only   Zithromax [Azithromycin] Diarrhea and Nausea And Vomiting    Social History   Socioeconomic History   Marital status: Married    Spouse name: Simmie   Number of children: 1   Years of  education: 16   Highest education level: Bachelor's degree (e.g., BA, AB, BS)  Occupational History   Not on file  Tobacco Use   Smoking status: Never   Smokeless tobacco: Never  Vaping Use   Vaping Use: Never used  Substance and Sexual Activity   Alcohol use: No   Drug use: No   Sexual activity: Yes  Other Topics Concern   Not on file  Social History Narrative   Patient lives with her husband in [place].    Patient has one daughter, two dogs, and  two cats.    Patient is an avid reader and enjoys doing crafts.    Social Determinants of Health   Financial Resource Strain: Low Risk    Difficulty of Paying Living Expenses: Not hard at all  Food Insecurity: No Food Insecurity   Worried About Charity fundraiser in the Last Year: Never true   [place] in the Last Year: Never true  Transportation Needs: No Transportation Needs   Lack of Transportation (Medical): No   Lack of Transportation (Non-Medical): No  Physical Activity: Inactive   Days of Exercise per Week: 0 days   Minutes of Exercise per Session: 0 min  Stress: Stress Concern Present   Feeling of Stress : To some extent  Social Connections: Moderately Isolated   Frequency  of Communication with Friends and Family: Three times a week   Frequency of Social Gatherings with Friends and Family: Twice a week   Attends Religious Services: Never   Marine scientist or Organizations: No   Attends Music therapist: Never   Marital Status: Married  Human resources officer Violence: Not At Risk   Fear of Current or Ex-Partner: No   Emotionally Abused: No   Physically Abused: No   Sexually Abused: No    Tobacco Use: Low Risk    Smoking Tobacco Use: Never   Smokeless Tobacco Use: Never   Passive Exposure: Not on file   Social History   Substance and Sexual Activity  Alcohol Use No    Family History  Problem Relation Age of Onset   Depression Mother    Obesity Father    Deep vein thrombosis Father    Arthritis Maternal Grandmother    Arthritis Maternal Grandfather    Arthritis Paternal Grandmother    Arthritis Paternal Grandfather    Breast cancer Neg Hx     Review of Systems  Constitutional:  Negative for chills and fever.  HENT:  Negative for congestion, sore throat and tinnitus.   Eyes:  Negative for double vision, photophobia and pain.  Respiratory:  Negative for cough, shortness of breath and wheezing.   Cardiovascular:  Negative for chest pain, palpitations and orthopnea.  Gastrointestinal:  Negative for heartburn, nausea and vomiting.  Genitourinary:  Negative for dysuria, frequency and urgency.  Musculoskeletal:  Positive for joint pain.  Neurological:  Negative for dizziness, weakness and headaches.   Objective:  Physical Exam: Well nourished and well developed.  General: Alert and oriented x3, cooperative and pleasant, no acute distress.  Head: normocephalic, atraumatic, neck supple.  Eyes: EOMI.  Musculoskeletal:   Right Knee Exam:   No effusion present. No swelling present. Valgus deformity.   The range of motion is: 5 to 120 degrees.   No crepitus on range of motion of the knee.   Positive lateral joint line  tenderness.   Positive medial joint line tenderness.   The knee is stable.  Calves soft  and nontender. Motor function intact in LE. Strength 5/5 LE bilaterally. Neuro: Distal pulses 2+. Sensation to light touch intact in LE.  Imaging Review Plain radiographs demonstrate severe degenerative joint disease of the right knee. The overall alignment is mild valgus. The bone quality appears to be adequate for age and reported activity level.  Assessment/Plan:  End stage arthritis, right knee   The patient history, physical examination, clinical judgment of the provider and imaging studies are consistent with end stage degenerative joint disease of the right knee and total knee arthroplasty is deemed medically necessary. The treatment options including medical management, injection therapy arthroscopy and arthroplasty were discussed at length. The risks and benefits of total knee arthroplasty were presented and reviewed. The risks due to aseptic loosening, infection, stiffness, patella tracking problems, thromboembolic complications and other imponderables were discussed. The patient acknowledged the explanation, agreed to proceed with the plan and consent was signed. Patient is being admitted for inpatient treatment for surgery, pain control, PT, OT, prophylactic antibiotics, VTE prophylaxis, progressive ambulation and ADLs and discharge planning. The patient is planning to be discharged  home .   Patient's anticipated LOS is less than 2 midnights, meeting these requirements: - Lives within 1 hour of care - Has a competent adult at home to recover with post-op recover - NO history of  - Chronic pain requiring opioids  - Diabetes  - Coronary Artery Disease  - Heart failure  - Heart attack  - Stroke  - Cardiac arrhythmia  - Respiratory Failure/COPD  - Renal failure  - Advanced Liver disease  Therapy Plans: Outpatient therapy at [facility] Disposition: Home with husband Planned DVT  Prophylaxis: Warfarin 6 mg QD + lovenox bridge DME Needed: [facility] PCP: Dorcas Mcmurray, MD (clearance received) TXA: Topical Allergies: Cephalosporins (diarrhea), codeine (nausea- tolerates Norco), cipro (diarrhea), clarithromycin (diarrhea), sulfa (diarrhea) Anesthesia Concerns: None BMI: 35.4 Last HgbA1c: Not diabetic.  Pharmacy: [facility])  Other: - Reports cannot have absorbable sutures - causes abscesses. Discussed with Dr. Wynelle Link - will plan for ethibond in deep tissues, monocryl for subcutaneous and skin closure - Seeing Dr. Nori Riis on 12/29 - will discuss whether lovenox bridge is indicated prior to surgery - Requests immodium PRN be ordered during time in hospital. Hx of colitis.  - Patient was instructed on what medications to stop prior to surgery. - Follow-up visit in 2 weeks with Dr. Wynelle Link - Begin physical therapy following surgery - Pre-operative lab work as pre-surgical testing - Prescriptions will be provided in hospital at time of discharge  Theresa Duty, PA-C Orthopedic Surgery [facility] [place]

## 2021-02-15 ENCOUNTER — Encounter: Payer: Self-pay | Admitting: Family Medicine

## 2021-02-15 ENCOUNTER — Other Ambulatory Visit: Payer: Self-pay | Admitting: Family Medicine

## 2021-02-15 ENCOUNTER — Other Ambulatory Visit: Payer: Self-pay

## 2021-02-15 ENCOUNTER — Ambulatory Visit (INDEPENDENT_AMBULATORY_CARE_PROVIDER_SITE_OTHER): Payer: Medicare Other | Admitting: Family Medicine

## 2021-02-15 VITALS — BP 119/55 | HR 72 | Ht 59.0 in | Wt 176.0 lb

## 2021-02-15 DIAGNOSIS — Z7901 Long term (current) use of anticoagulants: Secondary | ICD-10-CM | POA: Diagnosis not present

## 2021-02-15 DIAGNOSIS — R0789 Other chest pain: Secondary | ICD-10-CM | POA: Diagnosis not present

## 2021-02-15 NOTE — Patient Instructions (Signed)
Stop your Coumadin 5 days before surgery and then they will taper you back up using Lovenox bridge.  Great to see you and best of luck with your total knee!  Call me if you need me.

## 2021-02-16 NOTE — Assessment & Plan Note (Signed)
Long discussion.  I also discussed with Dr. Valentina Lucks.  Patient on lifelong anticoagulation from prior large PE.  She has no known specific hypercoagulable disease but she has some autoimmune issues with: Collagenous colitis, leukocytoclastic vasculitis, seronegative arthropathy.  They have already given her Lovenox to bridge her back to Coumadin starting the day after her surgery.  I offered her bridge for the days prior to surgery.  I think she is very low risk to have an event and we discussed at length.  Ultimately she decided that she did not want to pursue that.  She will let me know if she changes her mind.

## 2021-02-16 NOTE — Progress Notes (Signed)
° ° °  CHIEF COMPLAINT / HPI: #1.  Anticoagulation: She is getting ready to have total knee replacement and wants to talk about her anticoagulation and whether or not she needs as a bridge prior to the procedure with Lovenox.  She had done well on apixaban but had to convert back to warfarin due to finances. 2.  Episode of chest pain for which she went to the emergency room.  Has not had recurrence.  The chest pain was substernal and awakened her from sleep.  No chest pain since.  Did not feel like reflux.   PERTINENT  PMH / PSH: I have reviewed the patients medications, allergies, past medical and surgical history, smoking status and updated in the EMR as appropriate.   OBJECTIVE:  BP (!) 119/55    Pulse 72    Ht 4\' 11"  (1.499 m)    Wt 176 lb (79.8 kg)    SpO2 100%    BMI 35.55 kg/m  GENERAL: Well-developed female no acute distress CV: Regular rate and rhythm LUNGS: Clear to auscultation bilaterally  ASSESSMENT / PLAN: #1.  Episode of chest pain for which she was seen at the emergency department.  EKG was negative.  Troponins were flat.  This seems most likely to be noncardiac.  My concern is that she had already had her preop appointment for her total knee replacement done so they were unaware of this instance.  She does have a cardiologist and I will message him to see if he thinks she needs to be seen by cardiology prior to the surgery.  Chronic anticoagulation Long discussion.  I also discussed with Dr. Valentina Lucks.  Patient on lifelong anticoagulation from prior large PE.  She has no known specific hypercoagulable disease but she has some autoimmune issues with: Collagenous colitis, leukocytoclastic vasculitis, seronegative arthropathy.  They have already given her Lovenox to bridge her back to Coumadin starting the day after her surgery.  I offered her bridge for the days prior to surgery.  I think she is very low risk to have an event and we discussed at length.  Ultimately she decided that she  did not want to pursue that.  She will let me know if she changes her mind.   Dorcas Mcmurray MD

## 2021-02-20 ENCOUNTER — Telehealth: Payer: Self-pay

## 2021-02-20 NOTE — Telephone Encounter (Signed)
Patient calls nurse line to follow up on 12/29. Patient wanted to check the status of whether she needs to be cleared by cardiology prior to knee replacement surgery.   Please advise.   Talbot Grumbling, RN

## 2021-02-21 NOTE — Patient Instructions (Addendum)
DUE TO COVID-19 ONLY ONE VISITOR IS ALLOWED TO COME WITH YOU AND STAY IN THE WAITING ROOM ONLY DURING PRE OP AND PROCEDURE DAY OF SURGERY IF YOU ARE GOING HOME AFTER SURGERY. IF YOU ARE SPENDING THE NIGHT 2 PEOPLE MAY VISIT WITH YOU IN YOUR PRIVATE ROOM AFTER SURGERY UNTIL VISITING  HOURS ARE OVER AT 8:00 PM AND 1  VISITOR  MAY  SPEND THE NIGHT.   YOU NEED TO HAVE A COVID 19 TEST ON_1/12______ @__8 :45_____, THIS TEST MUST BE DONE BEFORE SURGERY, Come in through the main entrance of [place] and sit in the chairs directly to the right. Call [phone] and the nurse will come and get you. You do not need to stop at admitting.                [name]     Your procedure is scheduled on: 03/05/21   Report to [facility] Main  Entrance   Report to short stay at 5:15 AM     Call this number if you have problems the morning of surgery [phone]   No food after midnight.    You may have clear liquid until 4:30 AM.    At 4:00 AM drink pre surgery drink.   Nothing by mouth after 4:30 AM.    CLEAR LIQUID DIET   Foods Allowed                                                                     Foods Excluded  Coffee and tea, regular and decaf                             liquids that you cannot  Plain Jell-O any favor except red or purple                                           see through such as: Fruit ices (not with fruit pulp)                                     milk, soups, orange juice  Iced Popsicles                                    All solid food Carbonated beverages, regular and diet                                    Cranberry, grape and apple juices Sports drinks like Gatorade Lightly seasoned clear broth or consume(fat free) Sugar    BRUSH YOUR TEETH MORNING OF SURGERY AND RINSE YOUR MOUTH OUT, NO CHEWING GUM CANDY OR MINTS.     Take these medicines the morning of surgery with A SIP OF WATER: Venlafaxine, Nexium  You  may not have any metal on your body including hair pins and              piercings  Do not wear jewelry, make-up, lotions, powders or perfumes, deodorant             Do not wear nail polish on your fingernails.  Do not shave  48 hours prior to surgery.                 Do not bring valuables to the hospital. [place].  Contacts, dentures or bridgework may not be worn into surgery.  Leave suitcase in the car. After surgery it may be brought to your room.     Patients discharged the day of surgery will not be allowed to drive home. IF YOU ARE HAVING SURGERY AND GOING HOME THE SAME DAY, YOU MUST HAVE AN ADULT TO DRIVE YOU HOME AND BE WITH YOU FOR 24 HOURS. YOU MAY GO HOME BY TAXI OR UBER OR ORTHERWISE, BUT AN ADULT MUST ACCOMPANY YOU HOME AND STAY WITH YOU FOR 24 HOURS.  Name and phone number of your driver:  Special Instructions: N/A              Please read over the following fact sheets you were given: _____________________________________________________________________             [facility] - Preparing for Surgery Before surgery, you can play an important role.  Because skin is not sterile, your skin needs to be as free of germs as possible.  You can reduce the number of germs on your skin by washing with CHG (chlorahexidine gluconate) soap before surgery.  CHG is an antiseptic cleaner which kills germs and bonds with the skin to continue killing germs even after washing. Please DO NOT use if you have an allergy to CHG or antibacterial soaps.  If your skin becomes reddened/irritated stop using the CHG and inform your nurse when you arrive at Short Stay. Do not shave (including legs and underarms) for at least 48 hours prior to the first CHG shower.   Please follow these instructions carefully:  1.  Shower with CHG Soap the night before surgery and the  morning of Surgery.  2.  If you choose to wash your hair, wash your hair first as usual  with your  normal  shampoo.  3.  After you shampoo, rinse your hair and body thoroughly to remove the  shampoo.                            4.  Use CHG as you would any other liquid soap.  You can apply chg directly  to the skin and wash                       Gently with a scrungie or clean washcloth.  5.  Apply the CHG Soap to your body ONLY FROM THE NECK DOWN.   Do not use on face/ open                           Wound or open sores. Avoid contact with eyes, ears mouth and genitals (private parts).  Wash face,  Genitals (private parts) with your normal soap.             6.  Wash thoroughly, paying special attention to the area where your surgery  will be performed.  7.  Thoroughly rinse your body with warm water from the neck down.  8.  DO NOT shower/wash with your normal soap after using and rinsing off  the CHG Soap.                9.  Pat yourself dry with a clean towel.            10.  Wear clean pajamas.            11.  Place clean sheets on your bed the night of your first shower and do not  sleep with pets. Day of Surgery : Do not apply any lotions/deodorants the morning of surgery.  Please wear clean clothes to the hospital/surgery center.  FAILURE TO FOLLOW THESE INSTRUCTIONS MAY RESULT IN THE CANCELLATION OF YOUR SURGERY PATIENT SIGNATURE_________________________________  NURSE SIGNATURE__________________________________  ________________________________________________________________________   Adam Phenix  An incentive spirometer is a tool that can help keep your lungs clear and active. This tool measures how well you are filling your lungs with each breath. Taking long deep breaths may help reverse or decrease the chance of developing breathing (pulmonary) problems (especially infection) following: A long period of time when you are unable to move or be active. BEFORE THE PROCEDURE  If the spirometer includes an indicator to show your best  effort, your nurse or respiratory therapist will set it to a desired goal. If possible, sit up straight or lean slightly forward. Try not to slouch. Hold the incentive spirometer in an upright position. INSTRUCTIONS FOR USE  Sit on the edge of your bed if possible, or sit up as far as you can in bed or on a chair. Hold the incentive spirometer in an upright position. Breathe out normally. Place the mouthpiece in your mouth and seal your lips tightly around it. Breathe in slowly and as deeply as possible, raising the piston or the ball toward the top of the column. Hold your breath for 3-5 seconds or for as long as possible. Allow the piston or ball to fall to the bottom of the column. Remove the mouthpiece from your mouth and breathe out normally. Rest for a few seconds and repeat Steps 1 through 7 at least 10 times every 1-2 hours when you are awake. Take your time and take a few normal breaths between deep breaths. The spirometer may include an indicator to show your best effort. Use the indicator as a goal to work toward during each repetition. After each set of 10 deep breaths, practice coughing to be sure your lungs are clear. If you have an incision (the cut made at the time of surgery), support your incision when coughing by placing a pillow or rolled up towels firmly against it. Once you are able to get out of bed, walk around indoors and cough well. You may stop using the incentive spirometer when instructed by your caregiver.  RISKS AND COMPLICATIONS Take your time so you do not get dizzy or light-headed. If you are in pain, you may need to take or ask for pain medication before doing incentive spirometry. It is harder to take a deep breath if you are having pain. AFTER USE Rest and breathe slowly and easily. It can be helpful to keep track of  a log of your progress. Your caregiver can provide you with a simple table to help with this. If you are using the spirometer at home, follow  these instructions: [place] IF:  You are having difficultly using the spirometer. You have trouble using the spirometer as often as instructed. Your pain medication is not giving enough relief while using the spirometer. You develop fever of 100.5 F (38.1 C) or higher. SEEK IMMEDIATE MEDICAL CARE IF:  You cough up bloody sputum that had not been present before. You develop fever of 102 F (38.9 C) or greater. You develop worsening pain at or near the incision site. MAKE SURE YOU:  Understand these instructions. Will watch your condition. Will get help right away if you are not doing well or get worse. Document Released: 06/17/2006 Document Revised: 04/29/2011 Document Reviewed: 08/18/2006 [facility] Patient Information 2014 [place], [place].   ________________________________________________________________________

## 2021-02-22 ENCOUNTER — Encounter (HOSPITAL_COMMUNITY): Payer: Self-pay

## 2021-02-22 ENCOUNTER — Other Ambulatory Visit: Payer: Self-pay

## 2021-02-22 ENCOUNTER — Encounter (HOSPITAL_COMMUNITY)
Admission: RE | Admit: 2021-02-22 | Discharge: 2021-02-22 | Disposition: A | Discharge status: Home / Self Care | Payer: Medicare Other | Source: Ambulatory Visit | Attending: Orthopedic Surgery | Admitting: Orthopedic Surgery

## 2021-02-22 VITALS — BP 117/58 | HR 72 | Temp 98.7°F | Resp 19 | Ht 58.5 in | Wt 178.0 lb

## 2021-02-22 DIAGNOSIS — Z79899 Other long term (current) drug therapy: Secondary | ICD-10-CM | POA: Diagnosis not present

## 2021-02-22 DIAGNOSIS — Z01812 Encounter for preprocedural laboratory examination: Secondary | ICD-10-CM | POA: Insufficient documentation

## 2021-02-22 DIAGNOSIS — Z01818 Encounter for other preprocedural examination: Secondary | ICD-10-CM

## 2021-02-22 DIAGNOSIS — M1711 Unilateral primary osteoarthritis, right knee: Secondary | ICD-10-CM | POA: Diagnosis not present

## 2021-02-22 LAB — SURGICAL PCR SCREEN
MRSA, PCR: NEGATIVE
Staphylococcus aureus: NEGATIVE

## 2021-02-22 LAB — CBC
HCT: 38.8 % (ref 36.0–46.0)
Hemoglobin: 12.1 g/dL (ref 12.0–15.0)
MCH: 26.2 pg (ref 26.0–34.0)
MCHC: 31.2 g/dL (ref 30.0–36.0)
MCV: 84.2 fL (ref 80.0–100.0)
Platelets: 204 10*3/uL (ref 150–400)
RBC: 4.61 MIL/uL (ref 3.87–5.11)
RDW: 17.6 % — ABNORMAL HIGH (ref 11.5–15.5)
WBC: 4.4 10*3/uL (ref 4.0–10.5)
nRBC: 0 % (ref 0.0–0.2)

## 2021-02-22 LAB — COMPREHENSIVE METABOLIC PANEL
ALT: 29 U/L (ref 0–44)
AST: 40 U/L (ref 15–41)
Albumin: 3.1 g/dL — ABNORMAL LOW (ref 3.5–5.0)
Alkaline Phosphatase: 77 U/L (ref 38–126)
Anion gap: 4 — ABNORMAL LOW (ref 5–15)
BUN: 18 mg/dL (ref 8–23)
CO2: 28 mmol/L (ref 22–32)
Calcium: 8.5 mg/dL — ABNORMAL LOW (ref 8.9–10.3)
Chloride: 104 mmol/L (ref 98–111)
Creatinine, Ser: 0.42 mg/dL — ABNORMAL LOW (ref 0.44–1.00)
GFR, Estimated: >60 mL/min (ref >60)
Glucose, Bld: 97 mg/dL (ref 70–99)
Potassium: 4.7 mmol/L (ref 3.5–5.1)
Sodium: 136 mmol/L (ref 135–145)
Total Bilirubin: 0.4 mg/dL (ref 0.3–1.2)
Total Protein: 5.9 g/dL — ABNORMAL LOW (ref 6.5–8.1)

## 2021-02-22 LAB — PROTIME-INR
INR: 2.3 — ABNORMAL HIGH (ref 0.8–1.2)
Prothrombin Time: 25.2 seconds — ABNORMAL HIGH (ref 11.4–15.2)

## 2021-02-22 NOTE — Progress Notes (Signed)
COVID test- 1 12/ 23 at 8:45 am  PCP - Dr. Verlon Au Cardiologist - no  Chest x-ray - no EKG - 02/13/21-epic Stress Test - 2019 ECHO - 2018 Cardiac Cath - no Pacemaker/ICD device last checked:NA  Sleep Study - no CPAP -   Fasting Blood Sugar - NA hypoglycemic if she has a bolus of sugar Checks Blood Sugar _____ times a day  Blood Thinner Instructions:Coumadin/ Dr. Nori Riis Aspirin Instructions:Stop 5 days prior to DOS/ Dr. Nori Riis Last Dose:02/27/21  Anesthesia review: yes  Patient denies shortness of breath, fever, cough and chest pain at PAT appointment Pt has no SOB with activities. She has CP  02/10/21 and had a work up in the ED. All results were WNL. She was worried that it was a PE.   Patient verbalized understanding of instructions that were given to them at the PAT appointment. Patient was also instructed that they will need to review over the PAT instructions again at home before surgery. yes

## 2021-02-22 NOTE — Telephone Encounter (Signed)
Spoke to pt, gave her the message from Dr. Nori Riis. Pt stated she has her pre-op appt and everything is fine to go ahead with the surgery. Ottis Stain, CMA

## 2021-02-22 NOTE — Telephone Encounter (Signed)
Dear [name] Team Please tell her that I think she is fine to go. Cardiology has not responded so they must either think she is fine OR they are still on Holiday. I WILL send them another message today, but I suspect she is fine to go.  If she has any doubts, she can call their office and ask them---maybe they will respond quicker to her, but I think she is probably good to go. THANKS! [name]

## 2021-02-26 ENCOUNTER — Telehealth: Payer: Self-pay | Admitting: Interventional Cardiology

## 2021-02-26 DIAGNOSIS — R079 Chest pain, unspecified: Secondary | ICD-10-CM

## 2021-02-26 NOTE — Telephone Encounter (Signed)
[name], MD  [name], MD; [name], RN [name], Please schedule [name] prior to upcoming TKR scheduled for 03/05/2020.   Spoke with pt and reviewed recommendations.  Advised I will put instruction letter into MyChart.  Pt aware scheduling team will contact her to schedule.    Will route to Dr. Tamala Julian to sign attestation.

## 2021-02-27 ENCOUNTER — Other Ambulatory Visit: Payer: Self-pay

## 2021-02-27 ENCOUNTER — Ambulatory Visit (HOSPITAL_COMMUNITY): Payer: Medicare Other | Attending: Interventional Cardiology

## 2021-02-27 DIAGNOSIS — R079 Chest pain, unspecified: Secondary | ICD-10-CM | POA: Diagnosis not present

## 2021-02-27 LAB — MYOCARDIAL PERFUSION IMAGING
LV dias vol: 61 mL (ref 46–106)
LV sys vol: 24 mL
Nuc Stress EF: 60 %
Peak HR: 81 {beats}/min
Rest HR: 62 {beats}/min
Rest Nuclear Isotope Dose: 10.1 mCi
SDS: 9
SRS: 0
SSS: 9
ST Depression (mm): 0 mm
Stress Nuclear Isotope Dose: 30.5 mCi
TID: 1.05

## 2021-02-27 MED ORDER — TECHNETIUM TC 99M TETROFOSMIN IV KIT
10.1000 | PACK | Freq: Once | INTRAVENOUS | Status: AC | PRN
  Administered 2021-02-27: 10.1 via INTRAVENOUS
  Filled 2021-02-27: qty 11

## 2021-02-27 MED ORDER — TECHNETIUM TC 99M TETROFOSMIN IV KIT
30.5000 | PACK | Freq: Once | INTRAVENOUS | Status: AC | PRN
  Administered 2021-02-27: 30.5 via INTRAVENOUS
  Filled 2021-02-27: qty 31

## 2021-02-27 MED ORDER — REGADENOSON 0.4 MG/5ML IV SOLN
0.4000 mg | Freq: Once | INTRAVENOUS | Status: AC
  Administered 2021-02-27: 0.4 mg via INTRAVENOUS

## 2021-02-28 NOTE — Progress Notes (Signed)
Anesthesia Chart Review   Case: [id number] Date/Time: 03/05/21 0700   Procedure: TOTAL KNEE ARTHROPLASTY (Right: Knee)   Anesthesia type: Choice   Pre-op diagnosis: right knee osteoarthritis   Location: [facility] ROOM 09 / [facility]   Surgeons: Gaynelle Arabian, MD       DISCUSSION:71 y.o. never smoker with h/o PONV, HTN, GERD, PE 2018 on Coumadin, right knee OA scheduled for above procedure 03/05/2021 with Dr. Gaynelle Arabian.   Low risk stress test 02/27/2021.   Pt advised to hold Coumadin 5 days prior to procedure without Lovenox bridge.   Anticipate pt can proceed with planned procedure barring acute status change.   VS: BP (!) 117/58    Pulse 72    Temp 37.1 C (Oral)    Resp 19    Ht 4' 10.5" (1.486 m)    Wt 80.7 kg    SpO2 99%    BMI 36.57 kg/m   PROVIDERS: [name], MD is PCP    LABS: Labs reviewed: Acceptable for surgery. (all labs ordered are listed, but only abnormal results are displayed)  Labs Reviewed  CBC - Abnormal; Notable for the following components:      Result Value   RDW 17.6 (*)    All other components within normal limits  COMPREHENSIVE METABOLIC PANEL - Abnormal; Notable for the following components:   Creatinine, Ser 0.42 (*)    Calcium 8.5 (*)    Total Protein 5.9 (*)    Albumin 3.1 (*)    Anion gap 4 (*)    All other components within normal limits  PROTIME-INR - Abnormal; Notable for the following components:   Prothrombin Time 25.2 (*)    INR 2.3 (*)    All other components within normal limits  SURGICAL PCR SCREEN     IMAGES:   EKG: 02/10/2021 Rate 68 bpm  Sinus rhythm Abnormal R-wave progression   CV: Myocardial Perfusion 02/27/2021   Lexiscan stress is electrically negative for ischemia   Myoview scan shows normal perfusion and very mild soft tissue attenuation   No ischemia or scar   LVEF 60%   Prior study not available for comparison.   Low risk scan  Echo 12/04/2016 - Left ventricle: The cavity size was normal. Wall thickness was     normal. Systolic function was normal. The estimated ejection    fraction was in the range of 55% to 60%. Wall motion was normal;    there were no regional wall motion abnormalities. Doppler    parameters are consistent with abnormal left ventricular    relaxation (grade 1 diastolic dysfunction).  Past Medical History:  Diagnosis Date   Anemia    take iron   Anxiety    Arthritis    Depression    Dysrhythmia 2016   occ due to caffeine intake   Encounter for long-term (current) use of other medications    GERD (gastroesophageal reflux disease)    Hepatitis    age 53   History of hiatal hernia    Hypertension    IBS (irritable bowel syndrome)    Obesity    Osteoarthritis    Pain in joint, multiple sites    Pneumonia 2010   PONV (postoperative nausea and vomiting)    in the 1980's no problems since   Psoriatic arthropathy ([place])    Pulmonary embolism ([place]) 12/03/2016   submassive/notes 12/03/2016   Raynaud disease     Past Surgical History:  Procedure Laterality Date   ABDOMINAL HYSTERECTOMY  03/1998   COLONOSCOPY     GASTRIC BYPASS  1978   HALLUX VALGUS CORRECTION  09/2010   Dr Arnetha Courser; right 1st ray   LAPAROSCOPIC CHOLECYSTECTOMY  04/2000   Archie Endo 07/03/2010   LAPAROSCOPIC INCISIONAL / UMBILICAL / VENTRAL HERNIA REPAIR  09/2002   VHR/notes 07/03/2010   REVISION TOTAL KNEE ARTHROPLASTY Left 10/2003   Archie Endo 07/03/2010   REVISION TOTAL KNEE ARTHROPLASTY Left 02/2007   Archie Endo 06/21/2010   SHOULDER HEMI-ARTHROPLASTY Right 06/28/2016   Procedure: RIGHT SHOULDER HEMI-ARTHROPLASTY;  Surgeon: Netta Cedars, MD;  Location: [facility];  Service: Orthopedics;  Laterality: Right;   TOTAL KNEE ARTHROPLASTY Left 10/1999   Archie Endo 07/03/2010   TUBAL LIGATION  1982    MEDICATIONS:  ALPRAZolam (XANAX) 0.5 MG tablet   benazepril (LOTENSIN) 20 MG tablet   Calcium-Magnesium-Vitamin D (CALCIUM 1200+D3 PO)   Chlorpheniramine-DM (CORICIDIN HBP COUGH/COLD PO)   diclofenac (VOLTAREN) 75 MG EC  tablet   esomeprazole (NEXIUM) 20 MG capsule   fluconazole (DIFLUCAN) 100 MG tablet   furosemide (LASIX) 20 MG tablet   gabapentin (NEURONTIN) 100 MG capsule   venlafaxine XR (EFFEXOR-XR) 150 MG 24 hr capsule   venlafaxine XR (EFFEXOR-XR) 75 MG 24 hr capsule   warfarin (COUMADIN) 2 MG tablet   No current facility-administered medications for this encounter.    Konrad Felix, PA-C [facility] Pre-Surgical Testing [phone]

## 2021-03-01 ENCOUNTER — Encounter (HOSPITAL_COMMUNITY)
Admission: RE | Admit: 2021-03-01 | Discharge: 2021-03-01 | Disposition: A | Discharge status: Home / Self Care | Payer: Medicare Other | Source: Ambulatory Visit | Attending: Orthopedic Surgery | Admitting: Orthopedic Surgery

## 2021-03-01 ENCOUNTER — Other Ambulatory Visit: Payer: Self-pay

## 2021-03-01 DIAGNOSIS — Z01818 Encounter for other preprocedural examination: Secondary | ICD-10-CM

## 2021-03-01 DIAGNOSIS — Z20822 Contact with and (suspected) exposure to covid-19: Secondary | ICD-10-CM | POA: Insufficient documentation

## 2021-03-01 DIAGNOSIS — Z01812 Encounter for preprocedural laboratory examination: Secondary | ICD-10-CM | POA: Insufficient documentation

## 2021-03-01 LAB — SARS CORONAVIRUS 2 (TAT 6-24 HRS): SARS Coronavirus 2: NEGATIVE

## 2021-03-04 NOTE — Anesthesia Preprocedure Evaluation (Addendum)
Anesthesia Evaluation  Patient identified by MRN, date of birth, ID band Patient awake    Reviewed: Allergy & Precautions, H&P , NPO status , Patient's Chart, lab work & pertinent test results  History of Anesthesia Complications (+) PONV  Airway Mallampati: II  TM Distance: >3 FB Neck ROM: Full    Dental no notable dental hx. (+) Teeth Intact, Dental Advisory Given   Pulmonary neg pulmonary ROS, PE   Pulmonary exam normal breath sounds clear to auscultation       Cardiovascular Exercise Tolerance: Good hypertension, Pt. on medications  Rhythm:Regular Rate:Normal     Neuro/Psych Anxiety Depression negative neurological ROS     GI/Hepatic Neg liver ROS, hiatal hernia, GERD  Medicated,  Endo/Other  Morbid obesity  Renal/GU negative Renal ROS  negative genitourinary   Musculoskeletal  (+) Arthritis , Osteoarthritis,    Abdominal   Peds  Hematology  (+) Blood dyscrasia, anemia ,   Anesthesia Other Findings   Reproductive/Obstetrics negative OB ROS                            Anesthesia Physical Anesthesia Plan  ASA: 3  Anesthesia Plan: Spinal   Post-op Pain Management: Regional block   Induction: Intravenous  PONV Risk Score and Plan: 4 or greater and Ondansetron, Dexamethasone and Midazolam  Airway Management Planned: Natural Airway and Simple Face Mask  Additional Equipment:   Intra-op Plan:   Post-operative Plan:   Informed Consent: I have reviewed the patients History and Physical, chart, labs and discussed the procedure including the risks, benefits and alternatives for the proposed anesthesia with the patient or authorized representative who has indicated his/her understanding and acceptance.     Dental advisory given  Plan Discussed with: CRNA  Anesthesia Plan Comments:        Anesthesia Quick Evaluation

## 2021-03-05 ENCOUNTER — Other Ambulatory Visit: Payer: Self-pay

## 2021-03-05 ENCOUNTER — Ambulatory Visit (HOSPITAL_COMMUNITY): Payer: Medicare Other | Admitting: Physician Assistant

## 2021-03-05 ENCOUNTER — Encounter (HOSPITAL_COMMUNITY)
Admission: RE | Disposition: A | Discharge status: Home / Self Care | Payer: Self-pay | Source: Home / Self Care | Attending: Orthopedic Surgery

## 2021-03-05 ENCOUNTER — Observation Stay (HOSPITAL_COMMUNITY)
Admission: RE | Admit: 2021-03-05 | Discharge: 2021-03-06 | Disposition: A | Discharge status: Home / Self Care | Payer: Medicare Other | Attending: Orthopedic Surgery | Admitting: Orthopedic Surgery

## 2021-03-05 ENCOUNTER — Encounter (HOSPITAL_COMMUNITY): Payer: Self-pay | Admitting: Orthopedic Surgery

## 2021-03-05 ENCOUNTER — Ambulatory Visit (HOSPITAL_COMMUNITY): Payer: Medicare Other | Admitting: Anesthesiology

## 2021-03-05 DIAGNOSIS — M1711 Unilateral primary osteoarthritis, right knee: Secondary | ICD-10-CM | POA: Diagnosis not present

## 2021-03-05 DIAGNOSIS — M179 Osteoarthritis of knee, unspecified: Secondary | ICD-10-CM | POA: Diagnosis present

## 2021-03-05 DIAGNOSIS — Z96652 Presence of left artificial knee joint: Secondary | ICD-10-CM | POA: Diagnosis not present

## 2021-03-05 DIAGNOSIS — Z86711 Personal history of pulmonary embolism: Secondary | ICD-10-CM | POA: Insufficient documentation

## 2021-03-05 DIAGNOSIS — I10 Essential (primary) hypertension: Secondary | ICD-10-CM | POA: Insufficient documentation

## 2021-03-05 DIAGNOSIS — Z96611 Presence of right artificial shoulder joint: Secondary | ICD-10-CM | POA: Diagnosis not present

## 2021-03-05 DIAGNOSIS — G8918 Other acute postprocedural pain: Secondary | ICD-10-CM | POA: Diagnosis not present

## 2021-03-05 DIAGNOSIS — Z79899 Other long term (current) drug therapy: Secondary | ICD-10-CM | POA: Diagnosis not present

## 2021-03-05 DIAGNOSIS — Z7901 Long term (current) use of anticoagulants: Secondary | ICD-10-CM | POA: Diagnosis not present

## 2021-03-05 DIAGNOSIS — D649 Anemia, unspecified: Secondary | ICD-10-CM | POA: Diagnosis not present

## 2021-03-05 HISTORY — PX: TOTAL KNEE ARTHROPLASTY: SHX125

## 2021-03-05 LAB — PROTIME-INR
INR: 1 (ref 0.8–1.2)
Prothrombin Time: 13.2 seconds (ref 11.4–15.2)

## 2021-03-05 LAB — APTT: aPTT: 30 seconds (ref 24–36)

## 2021-03-05 SURGERY — ARTHROPLASTY, KNEE, TOTAL
Anesthesia: Spinal | Site: Knee | Laterality: Right

## 2021-03-05 MED ORDER — CHLORHEXIDINE GLUCONATE 0.12 % MT SOLN
15.0000 mL | Freq: Once | OROMUCOSAL | Status: AC
  Administered 2021-03-05: 15 mL via OROMUCOSAL

## 2021-03-05 MED ORDER — EPHEDRINE 5 MG/ML INJ
INTRAVENOUS | Status: AC
  Filled 2021-03-05: qty 5

## 2021-03-05 MED ORDER — LACTATED RINGERS IV SOLN: INTRAVENOUS | Status: DC

## 2021-03-05 MED ORDER — BUPIVACAINE LIPOSOME 1.3 % IJ SUSP
INTRAMUSCULAR | Status: DC | PRN
  Administered 2021-03-05: 20 mL

## 2021-03-05 MED ORDER — GABAPENTIN 100 MG PO CAPS
100.0000 mg | ORAL_CAPSULE | Freq: Two times a day (BID) | ORAL | Status: DC
  Administered 2021-03-05 – 2021-03-06 (×2): 100 mg via ORAL
  Filled 2021-03-05 (×2): qty 1

## 2021-03-05 MED ORDER — BUPIVACAINE LIPOSOME 1.3 % IJ SUSP
INTRAMUSCULAR | Status: AC
  Filled 2021-03-05: qty 20

## 2021-03-05 MED ORDER — MIDAZOLAM HCL 2 MG/2ML IJ SOLN
INTRAMUSCULAR | Status: AC
  Filled 2021-03-05: qty 2

## 2021-03-05 MED ORDER — SODIUM CHLORIDE 0.9 % IV SOLN: INTRAVENOUS | Status: DC

## 2021-03-05 MED ORDER — BUPIVACAINE-EPINEPHRINE (PF) 0.5% -1:200000 IJ SOLN
INTRAMUSCULAR | Status: DC | PRN
  Administered 2021-03-05: 20 mL via PERINEURAL

## 2021-03-05 MED ORDER — VANCOMYCIN HCL 1500 MG/300ML IV SOLN: 1500.0000 mg | INTRAVENOUS | Status: DC

## 2021-03-05 MED ORDER — MIDAZOLAM HCL 2 MG/2ML IJ SOLN
INTRAMUSCULAR | Status: DC | PRN
  Administered 2021-03-05: 2 mg via INTRAVENOUS

## 2021-03-05 MED ORDER — FUROSEMIDE 40 MG PO TABS
40.0000 mg | ORAL_TABLET | Freq: Every day | ORAL | Status: DC
  Filled 2021-03-05: qty 1

## 2021-03-05 MED ORDER — PROPOFOL 1000 MG/100ML IV EMUL
INTRAVENOUS | Status: AC
  Filled 2021-03-05: qty 100

## 2021-03-05 MED ORDER — PROPOFOL 500 MG/50ML IV EMUL
INTRAVENOUS | Status: DC | PRN
  Administered 2021-03-05: 75 ug/kg/min via INTRAVENOUS

## 2021-03-05 MED ORDER — DEXAMETHASONE SODIUM PHOSPHATE 10 MG/ML IJ SOLN: 8.0000 mg | Freq: Once | INTRAMUSCULAR | Status: DC

## 2021-03-05 MED ORDER — TRANEXAMIC ACID 1000 MG/10ML IV SOLN
2000.0000 mg | Freq: Once | INTRAVENOUS | Status: DC
  Filled 2021-03-05: qty 20

## 2021-03-05 MED ORDER — ONDANSETRON HCL 4 MG/2ML IJ SOLN
INTRAMUSCULAR | Status: DC | PRN
  Administered 2021-03-05: 4 mg via INTRAVENOUS

## 2021-03-05 MED ORDER — METOCLOPRAMIDE HCL 5 MG PO TABS: 5.0000 mg | ORAL_TABLET | Freq: Three times a day (TID) | ORAL | Status: DC | PRN

## 2021-03-05 MED ORDER — FENTANYL CITRATE (PF) 100 MCG/2ML IJ SOLN
INTRAMUSCULAR | Status: AC
  Filled 2021-03-05: qty 2

## 2021-03-05 MED ORDER — FENTANYL CITRATE (PF) 100 MCG/2ML IJ SOLN
INTRAMUSCULAR | Status: DC | PRN
  Administered 2021-03-05 (×2): 50 ug via INTRAVENOUS

## 2021-03-05 MED ORDER — SODIUM CHLORIDE (PF) 0.9 % IJ SOLN
INTRAMUSCULAR | Status: DC | PRN
  Administered 2021-03-05: 60 mL

## 2021-03-05 MED ORDER — ONDANSETRON HCL 4 MG/2ML IJ SOLN
INTRAMUSCULAR | Status: AC
  Filled 2021-03-05: qty 6

## 2021-03-05 MED ORDER — PHENYLEPHRINE 40 MCG/ML (10ML) SYRINGE FOR IV PUSH (FOR BLOOD PRESSURE SUPPORT)
PREFILLED_SYRINGE | INTRAVENOUS | Status: AC
  Filled 2021-03-05: qty 10

## 2021-03-05 MED ORDER — ACETAMINOPHEN 10 MG/ML IV SOLN
1000.0000 mg | Freq: Four times a day (QID) | INTRAVENOUS | Status: DC
  Administered 2021-03-05: 1000 mg via INTRAVENOUS
  Filled 2021-03-05: qty 100

## 2021-03-05 MED ORDER — PHENYLEPHRINE HCL (PRESSORS) 10 MG/ML IV SOLN
INTRAVENOUS | Status: AC
  Filled 2021-03-05: qty 1

## 2021-03-05 MED ORDER — SODIUM CHLORIDE 0.9 % IR SOLN
Status: DC | PRN
  Administered 2021-03-05: 1000 mL

## 2021-03-05 MED ORDER — POVIDONE-IODINE 10 % EX SWAB
2.0000 "application " | Freq: Once | CUTANEOUS | Status: AC
  Administered 2021-03-05: 2 via TOPICAL

## 2021-03-05 MED ORDER — FLEET ENEMA 7-19 GM/118ML RE ENEM: 1.0000 | ENEMA | Freq: Once | RECTAL | Status: DC | PRN

## 2021-03-05 MED ORDER — DIPHENHYDRAMINE HCL 12.5 MG/5ML PO ELIX
12.5000 mg | ORAL_SOLUTION | ORAL | Status: DC | PRN
  Administered 2021-03-05: 25 mg via ORAL
  Filled 2021-03-05: qty 10

## 2021-03-05 MED ORDER — PANTOPRAZOLE SODIUM 40 MG PO TBEC
40.0000 mg | DELAYED_RELEASE_TABLET | Freq: Every day | ORAL | Status: DC
  Administered 2021-03-06: 40 mg via ORAL
  Filled 2021-03-05: qty 1

## 2021-03-05 MED ORDER — METOCLOPRAMIDE HCL 5 MG/ML IJ SOLN: 5.0000 mg | Freq: Three times a day (TID) | INTRAMUSCULAR | Status: DC | PRN

## 2021-03-05 MED ORDER — MORPHINE SULFATE (PF) 2 MG/ML IV SOLN
0.5000 mg | INTRAVENOUS | Status: DC | PRN
  Administered 2021-03-05: 1 mg via INTRAVENOUS
  Filled 2021-03-05: qty 1

## 2021-03-05 MED ORDER — BUPIVACAINE LIPOSOME 1.3 % IJ SUSP: 20.0000 mL | Freq: Once | INTRAMUSCULAR | Status: DC

## 2021-03-05 MED ORDER — ALPRAZOLAM 0.5 MG PO TABS
0.5000 mg | ORAL_TABLET | Freq: Three times a day (TID) | ORAL | Status: DC | PRN
  Administered 2021-03-05: 0.5 mg via ORAL
  Filled 2021-03-05: qty 1

## 2021-03-05 MED ORDER — DEXAMETHASONE SODIUM PHOSPHATE 10 MG/ML IJ SOLN
INTRAMUSCULAR | Status: DC | PRN
  Administered 2021-03-05: 10 mg via INTRAVENOUS

## 2021-03-05 MED ORDER — HYDROMORPHONE HCL 2 MG PO TABS
2.0000 mg | ORAL_TABLET | ORAL | Status: DC | PRN
  Administered 2021-03-05: 4 mg via ORAL
  Administered 2021-03-05 (×2): 2 mg via ORAL
  Administered 2021-03-06 (×3): 4 mg via ORAL
  Filled 2021-03-05 (×2): qty 1
  Filled 2021-03-05 (×4): qty 2

## 2021-03-05 MED ORDER — MENTHOL 3 MG MT LOZG: 1.0000 | LOZENGE | OROMUCOSAL | Status: DC | PRN

## 2021-03-05 MED ORDER — WARFARIN - PHARMACIST DOSING INPATIENT: Freq: Every day | Status: DC

## 2021-03-05 MED ORDER — METHOCARBAMOL 500 MG IVPB - SIMPLE MED
500.0000 mg | Freq: Four times a day (QID) | INTRAVENOUS | Status: DC | PRN
  Filled 2021-03-05: qty 50

## 2021-03-05 MED ORDER — PROPOFOL 500 MG/50ML IV EMUL
INTRAVENOUS | Status: AC
  Filled 2021-03-05: qty 100

## 2021-03-05 MED ORDER — VANCOMYCIN HCL IN DEXTROSE 1-5 GM/200ML-% IV SOLN
1000.0000 mg | INTRAVENOUS | Status: AC
  Administered 2021-03-05: 1000 mg via INTRAVENOUS
  Filled 2021-03-05: qty 200

## 2021-03-05 MED ORDER — VANCOMYCIN HCL IN DEXTROSE 1-5 GM/200ML-% IV SOLN
1000.0000 mg | Freq: Two times a day (BID) | INTRAVENOUS | Status: AC
  Administered 2021-03-05: 1000 mg via INTRAVENOUS
  Filled 2021-03-05: qty 200

## 2021-03-05 MED ORDER — VENLAFAXINE HCL ER 75 MG PO CP24
75.0000 mg | ORAL_CAPSULE | Freq: Every day | ORAL | Status: DC
  Administered 2021-03-06: 75 mg via ORAL
  Filled 2021-03-05: qty 1

## 2021-03-05 MED ORDER — ORAL CARE MOUTH RINSE: 15.0000 mL | Freq: Once | OROMUCOSAL | Status: AC

## 2021-03-05 MED ORDER — LIDOCAINE HCL (PF) 2 % IJ SOLN
INTRAMUSCULAR | Status: AC
  Filled 2021-03-05: qty 5

## 2021-03-05 MED ORDER — PHENOL 1.4 % MT LIQD: 1.0000 | OROMUCOSAL | Status: DC | PRN

## 2021-03-05 MED ORDER — DOCUSATE SODIUM 100 MG PO CAPS
100.0000 mg | ORAL_CAPSULE | Freq: Two times a day (BID) | ORAL | Status: DC
  Administered 2021-03-05 – 2021-03-06 (×2): 100 mg via ORAL
  Filled 2021-03-05 (×2): qty 1

## 2021-03-05 MED ORDER — WARFARIN SODIUM 6 MG PO TABS
6.0000 mg | ORAL_TABLET | Freq: Once | ORAL | Status: AC
  Administered 2021-03-05: 6 mg via ORAL
  Filled 2021-03-05: qty 1

## 2021-03-05 MED ORDER — 0.9 % SODIUM CHLORIDE (POUR BTL) OPTIME
TOPICAL | Status: DC | PRN
Start: 2021-03-05 — End: 2021-03-05
  Administered 2021-03-05: 1000 mL

## 2021-03-05 MED ORDER — BISACODYL 10 MG RE SUPP: 10.0000 mg | Freq: Every day | RECTAL | Status: DC | PRN

## 2021-03-05 MED ORDER — DEXAMETHASONE SODIUM PHOSPHATE 10 MG/ML IJ SOLN
10.0000 mg | Freq: Once | INTRAMUSCULAR | Status: AC
  Administered 2021-03-06: 10 mg via INTRAVENOUS
  Filled 2021-03-05: qty 1

## 2021-03-05 MED ORDER — STERILE WATER FOR IRRIGATION IR SOLN
Status: DC | PRN
  Administered 2021-03-05: 2000 mL

## 2021-03-05 MED ORDER — ACETAMINOPHEN 500 MG PO TABS
1000.0000 mg | ORAL_TABLET | Freq: Four times a day (QID) | ORAL | Status: AC
  Administered 2021-03-05 – 2021-03-06 (×4): 1000 mg via ORAL
  Filled 2021-03-05 (×6): qty 2

## 2021-03-05 MED ORDER — DEXAMETHASONE SODIUM PHOSPHATE 10 MG/ML IJ SOLN
INTRAMUSCULAR | Status: AC
  Filled 2021-03-05: qty 3

## 2021-03-05 MED ORDER — SODIUM CHLORIDE (PF) 0.9 % IJ SOLN
INTRAMUSCULAR | Status: AC
  Filled 2021-03-05: qty 10

## 2021-03-05 MED ORDER — HYDROMORPHONE HCL 1 MG/ML IJ SOLN: 0.2500 mg | INTRAMUSCULAR | Status: DC | PRN

## 2021-03-05 MED ORDER — METHOCARBAMOL 500 MG PO TABS
500.0000 mg | ORAL_TABLET | Freq: Four times a day (QID) | ORAL | Status: DC | PRN
  Administered 2021-03-05 – 2021-03-06 (×4): 500 mg via ORAL
  Filled 2021-03-05 (×4): qty 1

## 2021-03-05 MED ORDER — PHENYLEPHRINE HCL-NACL 20-0.9 MG/250ML-% IV SOLN
INTRAVENOUS | Status: AC
  Filled 2021-03-05: qty 250

## 2021-03-05 MED ORDER — ONDANSETRON HCL 4 MG PO TABS: 4.0000 mg | ORAL_TABLET | Freq: Four times a day (QID) | ORAL | Status: DC | PRN

## 2021-03-05 MED ORDER — BUPIVACAINE IN DEXTROSE 0.75-8.25 % IT SOLN
INTRATHECAL | Status: DC | PRN
  Administered 2021-03-05: 1.6 mL via INTRATHECAL

## 2021-03-05 MED ORDER — LOPERAMIDE HCL 2 MG PO CAPS: 2.0000 mg | ORAL_CAPSULE | ORAL | Status: DC | PRN

## 2021-03-05 MED ORDER — ENOXAPARIN SODIUM 40 MG/0.4ML IJ SOSY
40.0000 mg | PREFILLED_SYRINGE | INTRAMUSCULAR | Status: DC
  Administered 2021-03-06: 40 mg via SUBCUTANEOUS
  Filled 2021-03-05: qty 0.4

## 2021-03-05 MED ORDER — POLYETHYLENE GLYCOL 3350 17 G PO PACK: 17.0000 g | PACK | Freq: Every day | ORAL | Status: DC | PRN

## 2021-03-05 MED ORDER — PHENYLEPHRINE 40 MCG/ML (10ML) SYRINGE FOR IV PUSH (FOR BLOOD PRESSURE SUPPORT)
PREFILLED_SYRINGE | INTRAVENOUS | Status: DC | PRN
  Administered 2021-03-05: 80 ug via INTRAVENOUS

## 2021-03-05 MED ORDER — ONDANSETRON HCL 4 MG/2ML IJ SOLN: 4.0000 mg | Freq: Four times a day (QID) | INTRAMUSCULAR | Status: DC | PRN

## 2021-03-05 MED ORDER — VENLAFAXINE HCL ER 150 MG PO CP24
150.0000 mg | ORAL_CAPSULE | Freq: Every day | ORAL | Status: DC
  Filled 2021-03-05: qty 1

## 2021-03-05 MED ORDER — PHENYLEPHRINE HCL-NACL 20-0.9 MG/250ML-% IV SOLN
INTRAVENOUS | Status: DC | PRN
  Administered 2021-03-05: 50 ug/min via INTRAVENOUS

## 2021-03-05 SURGICAL SUPPLY — 56 items
ATTUNE MED DOME PAT 38 KNEE (Knees) ×1 IMPLANT
ATTUNE PS FEM RT SZ 4 CEM KNEE (Femur) ×1 IMPLANT
ATTUNE PSRP INSR SZ4 10 KNEE (Insert) ×1 IMPLANT
BAG COUNTER SPONGE SURGICOUNT (BAG) IMPLANT
BAG SPEC THK2 15X12 ZIP CLS (MISCELLANEOUS) ×1
BAG SPNG CNTER NS LX DISP (BAG)
BAG ZIPLOCK 12X15 (MISCELLANEOUS) ×3 IMPLANT
BASE TIBIAL ROT PLAT SZ 5 KNEE (Knees) IMPLANT
BLADE SAG 18X100X1.27 (BLADE) ×3 IMPLANT
BLADE SAW SGTL 11.0X1.19X90.0M (BLADE) ×3 IMPLANT
BNDG ELASTIC 6X5.8 VLCR STR LF (GAUZE/BANDAGES/DRESSINGS) ×3 IMPLANT
BOWL SMART MIX CTS (DISPOSABLE) ×3 IMPLANT
BSPLAT TIB 5 CMNT ROT PLAT STR (Knees) ×1 IMPLANT
CEMENT HV SMART SET (Cement) ×6 IMPLANT
COVER SURGICAL LIGHT HANDLE (MISCELLANEOUS) ×3 IMPLANT
CUFF TOURN SGL QUICK 34 (TOURNIQUET CUFF) ×2
CUFF TRNQT CYL 34X4.125X (TOURNIQUET CUFF) ×2 IMPLANT
DECANTER SPIKE VIAL GLASS SM (MISCELLANEOUS) ×3 IMPLANT
DRAPE INCISE IOBAN 66X45 STRL (DRAPES) ×3 IMPLANT
DRAPE U-SHAPE 47X51 STRL (DRAPES) ×3 IMPLANT
DRSG AQUACEL AG ADV 3.5X10 (GAUZE/BANDAGES/DRESSINGS) ×3 IMPLANT
DURAPREP 26ML APPLICATOR (WOUND CARE) ×3 IMPLANT
ELECT REM PT RETURN 15FT ADLT (MISCELLANEOUS) ×3 IMPLANT
GLOVE SRG 8 PF TXTR STRL LF DI (GLOVE) ×2 IMPLANT
GLOVE SURG ENC MOIS LTX SZ6.5 (GLOVE) ×3 IMPLANT
GLOVE SURG ENC MOIS LTX SZ8 (GLOVE) ×6 IMPLANT
GLOVE SURG UNDER POLY LF SZ7 (GLOVE) ×3 IMPLANT
GLOVE SURG UNDER POLY LF SZ8 (GLOVE) ×2
GLOVE SURG UNDER POLY LF SZ8.5 (GLOVE) ×3 IMPLANT
GOWN STRL REUS W/TWL LRG LVL3 (GOWN DISPOSABLE) ×6 IMPLANT
GOWN STRL REUS W/TWL XL LVL3 (GOWN DISPOSABLE) ×3 IMPLANT
HANDPIECE INTERPULSE COAX TIP (DISPOSABLE) ×2
HOLDER FOLEY CATH W/STRAP (MISCELLANEOUS) IMPLANT
IMMOBILIZER KNEE 20 (SOFTGOODS) ×2 IMPLANT
IMMOBILIZER KNEE 20 THIGH 36 (SOFTGOODS) ×2 IMPLANT
KIT TURNOVER KIT A (KITS) IMPLANT
MANIFOLD NEPTUNE II (INSTRUMENTS) ×3 IMPLANT
NS IRRIG 1000ML POUR BTL (IV SOLUTION) ×3 IMPLANT
PACK TOTAL KNEE CUSTOM (KITS) ×3 IMPLANT
PADDING CAST COTTON 6X4 STRL (CAST SUPPLIES) ×5 IMPLANT
PROTECTOR NERVE ULNAR (MISCELLANEOUS) ×3 IMPLANT
SET HNDPC FAN SPRY TIP SCT (DISPOSABLE) ×2 IMPLANT
SPONGE T-LAP 18X18 ~~LOC~~+RFID (SPONGE) ×9 IMPLANT
STRIP CLOSURE SKIN 1/2X4 (GAUZE/BANDAGES/DRESSINGS) ×6 IMPLANT
SUT ETHIBOND NAB CT1 #1 30IN (SUTURE) ×2 IMPLANT
SUT MNCRL AB 4-0 PS2 18 (SUTURE) ×3 IMPLANT
SUT MON AB 2-0 CT1 36 (SUTURE) ×3 IMPLANT
SUT STRATAFIX 0 PDS 27 VIOLET (SUTURE)
SUT VIC AB 2-0 CT1 27 (SUTURE)
SUT VIC AB 2-0 CT1 TAPERPNT 27 (SUTURE) ×6 IMPLANT
SUTURE STRATFX 0 PDS 27 VIOLET (SUTURE) ×2 IMPLANT
TIBIAL BASE ROT PLAT SZ 5 KNEE (Knees) ×2 IMPLANT
TRAY FOLEY MTR SLVR 16FR STAT (SET/KITS/TRAYS/PACK) ×3 IMPLANT
TUBE SUCTION HIGH CAP CLEAR NV (SUCTIONS) ×3 IMPLANT
WATER STERILE IRR 1000ML POUR (IV SOLUTION) ×6 IMPLANT
WRAP KNEE MAXI GEL POST OP (GAUZE/BANDAGES/DRESSINGS) ×3 IMPLANT

## 2021-03-05 NOTE — Anesthesia Procedure Notes (Addendum)
Anesthesia Regional Block: Adductor canal block   Pre-Anesthetic Checklist: , timeout performed,  Correct Patient, Correct Site, Correct Laterality,  Correct Procedure, Correct Position, site marked,  Risks and benefits discussed,  Pre-op evaluation,  At surgeon's request and post-op pain management  Laterality: Right  Prep: Maximum Sterile Barrier Precautions used, chloraprep       Needles:  Injection technique: Single-shot  Needle Type: Echogenic Stimulator Needle     Needle Length: 9cm  Needle Gauge: 21     Additional Needles:   Procedures:,,,, ultrasound used (permanent image in chart),,    Narrative:  Start time: 03/05/2021 6:55 AM End time: 03/05/2021 7:05 AM Injection made incrementally with aspirations every 5 mL.  Performed by: Personally  Anesthesiologist: Roderic Palau, MD

## 2021-03-05 NOTE — Evaluation (Signed)
Physical Therapy Evaluation Patient Details Name: [name] MRN: [id number] DOB: [birth year] Today's Date: 03/05/2021  History of Present Illness  71 yo female s/p R TKA on 03/05/20. PMH: R shoulder hemiarthroplasty, L TKA &  revision, HTN, neuropathy  Clinical Impression  Pt is s/p TKA resulting in the deficits listed below (see PT Problem List).  Pt doing well today, amb 61' with RW and min assist. Anticipate steady progress.  Pt will benefit from skilled PT to increase their independence and safety with mobility to allow discharge to the venue listed below.         Recommendations for follow up therapy are one component of a multi-disciplinary discharge planning process, led by the attending physician.  Recommendations may be updated based on patient status, additional functional criteria and insurance authorization.  Follow Up Recommendations Follow physician's recommendations for discharge plan and follow up therapies    Assistance Recommended at Discharge Intermittent Supervision/Assistance  Patient can return home with the following  Help with stairs or ramp for entrance;A little help with walking and/or transfers    Equipment Recommendations Rolling walker (2 wheels) (youth ht)  Recommendations for Other Services       Functional Status Assessment Patient has had a recent decline in their functional status and demonstrates the ability to make significant improvements in function in a reasonable and predictable amount of time.     Precautions / Restrictions Precautions Precautions: Fall;Knee Precaution Comments: pt endorses multiple falls Required Braces or Orthoses: Knee Immobilizer - Right Knee Immobilizer - Right: Discontinue once straight leg raise with < 10 degree lag Restrictions Weight Bearing Restrictions: No RLE Weight Bearing: Weight bearing as tolerated      Mobility  Bed Mobility Overal bed mobility: Needs Assistance Bed Mobility: Supine to Sit      Supine to sit: Min guard     General bed mobility comments: incr time, effortful, min/guard for safety    Transfers Overall transfer level: Needs assistance Equipment used: Rolling walker (2 wheels) Transfers: Sit to/from Stand Sit to Stand: Min assist           General transfer comment: light assist to rise and transition to RW, cues for hand placement and RLE position    Ambulation/Gait Ambulation/Gait assistance: Min assist Gait Distance (Feet): 60 Feet Assistive device: Rolling walker (2 wheels) Gait Pattern/deviations: Step-to pattern;Trunk flexed       General Gait Details: cues for sequence and RW position, trunk extension as able  Stairs            Wheelchair Mobility    Modified Rankin (Stroke Patients Only)       Balance                                             Pertinent Vitals/Pain Pain Assessment: Faces Faces Pain Scale: Hurts a little bit Pain Location: right knee Pain Descriptors / Indicators: Sore Pain Intervention(s): Limited activity within patient's tolerance;Monitored during session;Premedicated before session;Repositioned    Home Living Family/patient expects to be discharged to:: Private residence Living Arrangements: Spouse/significant other Available Help at Discharge: Family Type of Home: House Home Access: Stairs to enter Entrance Stairs-Rails: Psychiatric nurse of Steps: 4   Home Layout: One level Home Equipment: Rollator (4 wheels);Cane - single point      Prior Function Prior Level of Function : Independent/Modified Independent;Needs assist  Mobility Comments: Quarry manager, uses rollator for longer distances       Hand Dominance        Extremity/Trunk Assessment   Upper Extremity Assessment Upper Extremity Assessment: Overall WFL for tasks assessed    Lower Extremity Assessment Lower Extremity Assessment: RLE deficits/detail RLE Deficits /  Details: ankle WFL, knee and hip grossly 2+/5       Communication   Communication: No difficulties  Cognition Arousal/Alertness: Awake/alert Behavior During Therapy: WFL for tasks assessed/performed Overall Cognitive Status: Within Functional Limits for tasks assessed                                          General Comments      Exercises Total Joint Exercises Ankle Circles/Pumps: AROM;Both;10 reps   Assessment/Plan    PT Assessment Patient needs continued PT services  PT Problem List Decreased strength;Decreased mobility;Decreased range of motion;Decreased activity tolerance;Decreased balance;Decreased knowledge of use of DME;Pain       PT Treatment Interventions DME instruction;Therapeutic activities;Gait training;Functional mobility training;Therapeutic exercise;Patient/family education;Stair training    PT Goals (Current goals can be found in the Care Plan section)  Acute Rehab PT Goals Patient Stated Goal: walk better PT Goal Formulation: With patient Time For Goal Achievement: 03/12/21 Potential to Achieve Goals: Good    Frequency 7X/week     Co-evaluation               AM-PAC PT "6 Clicks" Mobility  Outcome Measure Help needed turning from your back to your side while in a flat bed without using bedrails?: A Little Help needed moving from lying on your back to sitting on the side of a flat bed without using bedrails?: A Little Help needed moving to and from a bed to a chair (including a wheelchair)?: A Little Help needed standing up from a chair using your arms (e.g., wheelchair or bedside chair)?: A Little Help needed to walk in hospital room?: A Little Help needed climbing 3-5 steps with a railing? : A Lot 6 Click Score: 17    End of Session Equipment Utilized During Treatment: Gait belt;Right knee immobilizer Activity Tolerance: Patient tolerated treatment well Patient left: in chair;with call bell/phone within reach (no alarm   box in room) Nurse Communication: Mobility status PT Visit Diagnosis: Other abnormalities of gait and mobility (R26.89);Difficulty in walking, not elsewhere classified (R26.2);Repeated falls (R29.6)    Time: 1300-1330 PT Time Calculation (min) (ACUTE ONLY): 30 min   Charges:   PT Evaluation $PT Eval Low Complexity: 1 Low PT Treatments $Gait Training: 8-22 mins        [name], PT  Acute Rehab Dept ([place]) [phone] Pager [phone]  03/05/2021   [facility] 03/05/2021, 1:45 PM

## 2021-03-05 NOTE — Interval H&P Note (Signed)
History and Physical Interval Note:  03/05/2021 6:31 AM  [name]  has presented today for surgery, with the diagnosis of right knee osteoarthritis.  The various methods of treatment have been discussed with the patient and family. After consideration of risks, benefits and other options for treatment, the patient has consented to  Procedure(s): TOTAL KNEE ARTHROPLASTY (Right) as a surgical intervention.  The patient's history has been reviewed, patient examined, no change in status, stable for surgery.  I have reviewed the patient's chart and labs.  Questions were answered to the patient's satisfaction.     Pilar Plate Merced Hanners

## 2021-03-05 NOTE — Op Note (Signed)
OPERATIVE REPORT-TOTAL KNEE ARTHROPLASTY   Pre-operative diagnosis- Osteoarthritis  Right knee(s)  Post-operative diagnosis- Osteoarthritis Right knee(s)  Procedure-  Right  Total Knee Arthroplasty  Surgeon- Dione Plover. Nafis Farnan, MD  Assistant- Molli Barrows, PA-C   Anesthesia-   Adductor canal block and spinal  EBL-25 ml   Drains None  Tourniquet time-  Total Tourniquet Time Documented: Thigh (Right) - 32 minutes Total: Thigh (Right) - 32 minutes     Complications- None  Condition-PACU - hemodynamically stable.   Brief Clinical Note  [name] is a 71 y.o. year old female with end stage OA of her right knee with progressively worsening pain and dysfunction. She has constant pain, with activity and at rest and significant functional deficits with difficulties even with ADLs. She has had extensive non-op management including analgesics, injections of cortisone and viscosupplements, and home exercise program, but remains in significant pain with significant dysfunction.Radiographs show bone on bone arthritis lateral and patellofemoral. She presents now for right Total Knee Arthroplasty.     Procedure in detail---   The patient is brought into the operating room and positioned supine on the operating table. After successful administration of  Adductor canal block and spinal,   a tourniquet is placed high on the  Right thigh(s) and the lower extremity is prepped and draped in the usual sterile fashion. Time out is performed by the operating team and then the  Right lower extremity is wrapped in Esmarch, knee flexed and the tourniquet inflated to 300 mmHg.       A midline incision is made with a ten blade through the subcutaneous tissue to the level of the extensor mechanism. A fresh blade is used to make a medial parapatellar arthrotomy. Soft tissue over the proximal medial tibia is subperiosteally elevated to the joint line with a knife and into the semimembranosus bursa with a  Cobb elevator. Soft tissue over the proximal lateral tibia is elevated with attention being paid to avoiding the patellar tendon on the tibial tubercle. The patella is everted, knee flexed 90 degrees and the ACL and PCL are removed. Findings are bone on bone lateral and patellofemoral with large global osteophytes        The drill is used to create a starting hole in the distal femur and the canal is thoroughly irrigated with sterile saline to remove the fatty contents. The 5 degree Right  valgus alignment guide is placed into the femoral canal and the distal femoral cutting block is pinned to remove 9 mm off the distal femur. Resection is made with an oscillating saw.      The tibia is subluxed forward and the menisci are removed. The extramedullary alignment guide is placed referencing proximally at the medial aspect of the tibial tubercle and distally along the second metatarsal axis and tibial crest. The block is pinned to remove 65mm off the more deficient lateral  side. Resection is made with an oscillating saw. Size 5is the most appropriate size for the tibia and the proximal tibia is prepared with the modular drill and keel punch for that size.      The femoral sizing guide is placed and size 4 is most appropriate. Rotation is marked off the epicondylar axis and confirmed by creating a rectangular flexion gap at 90 degrees. The size 4 cutting block is pinned in this rotation and the anterior, posterior and chamfer cuts are made with the oscillating saw. The intercondylar block is then placed and that cut is made.  Trial size 5 tibial component, trial size 4 posterior stabilized femur and a 10  mm posterior stabilized rotating platform insert trial is placed. Full extension is achieved with excellent varus/valgus and anterior/posterior balance throughout full range of motion. The patella is everted and thickness measured to be 22  mm. Free hand resection is taken to 12 mm, a 38 template is placed, lug  holes are drilled, trial patella is placed, and it tracks normally. Osteophytes are removed off the posterior femur with the trial in place. All trials are removed and the cut bone surfaces prepared with pulsatile lavage. Cement is mixed and once ready for implantation, the size 5 tibial implant, size  4 posterior stabilized femoral component, and the size 38 patella are cemented in place and the patella is held with the clamp. The trial insert is placed and the knee held in full extension. The Exparel (20 ml mixed with 60 ml saline) is injected into the extensor mechanism, posterior capsule, medial and lateral gutters and subcutaneous tissues.  All extruded cement is removed and once the cement is hard the permanent 10 mm posterior stabilized rotating platform insert is placed into the tibial tray.      The wound is copiously irrigated with saline solution and the extensor mechanism closed with # 0 Stratofix suture. The tourniquet is released for a total tourniquet time of 32  minutes. Flexion against gravity is 140 degrees and the patella tracks normally. Subcutaneous tissue is closed with 2.0 vicryl and subcuticular with running 4.0 Monocryl. The incision is cleaned and dried and steri-strips and a bulky sterile dressing are applied. The limb is placed into a knee immobilizer and the patient is awakened and transported to recovery in stable condition.      Please note that a surgical assistant was a medical necessity for this procedure in order to perform it in a safe and expeditious manner. Surgical assistant was necessary to retract the ligaments and vital neurovascular structures to prevent injury to them and also necessary for proper positioning of the limb to allow for anatomic placement of the prosthesis.   Dione Plover Isabelly Kobler, MD    03/05/2021, 8:24 AM

## 2021-03-05 NOTE — Progress Notes (Signed)
Orthopedic Tech Progress Note Patient Details:  [name] 03/05/1950 [id number]  CPM Right Knee CPM Right Knee: Off Right Knee Flexion (Degrees): 40 Right Knee Extension (Degrees): 10 Additional Comments: PT requested that pt be taken out.  Post Interventions Patient Tolerated: Well  Vernona Rieger 03/05/2021, 2:35 PM

## 2021-03-05 NOTE — Discharge Instructions (Signed)
Jacqueline Arabian, MD Total Joint Specialist [facility] [place] [address], [address] [place], [place] [address] [phone]  TOTAL KNEE REPLACEMENT POSTOPERATIVE DIRECTIONS    Knee Rehabilitation, Guidelines Following Surgery  Results after knee surgery are often greatly improved when you follow the exercise, range of motion and muscle strengthening exercises prescribed by your doctor. Safety measures are also important to protect the knee from further injury. If any of these exercises cause you to have increased pain or swelling in your knee joint, decrease the amount until you are comfortable again and slowly increase them. If you have problems or questions, call your caregiver or physical therapist for advice.   BLOOD CLOT PREVENTION You will be discharged with lovenox injections (for 5 days total following surgery) to take in addition to your usual warfarin dose following surgery. Call Dr. Verlon Au office to schedule a PT/INR check on Friday, once this is therapeutic you may discontinue the lovenox injections.  HOME CARE INSTRUCTIONS  Remove items at home which could result in a fall. This includes throw rugs or furniture in walking pathways.  ICE to the affected knee as much as tolerated. Icing helps control swelling. If the swelling is well controlled you will be more comfortable and rehab easier. Continue to use ice on the knee for pain and swelling from surgery. You may notice swelling that will progress down to the foot and ankle. This is normal after surgery. Elevate the leg when you are not up walking on it.    Continue to use the breathing machine which will help keep your temperature down. It is common for your temperature to cycle up and down following surgery, especially at night when you are not up moving around and exerting yourself. The breathing machine keeps your lungs expanded and your temperature down. Do not place pillow under the operative knee, focus on  keeping the knee straight while resting  DIET You may resume your previous home diet once you are discharged from the hospital.  DRESSING / WOUND CARE / SHOWERING Keep your bulky bandage on for 2 days. On the third post-operative day you may remove the Ace bandage and gauze. There is a waterproof adhesive bandage on your skin which will stay in place until your first follow-up appointment. Once you remove this you will not need to place another bandage You may begin showering 3 days following surgery, but do not submerge the incision under water.  ACTIVITY For the first 5 days, the key is rest and control of pain and swelling Do your home exercises twice a day starting on post-operative day 3. On the days you go to physical therapy, just do the home exercises once that day. You should rest, ice and elevate the leg for 50 minutes out of every hour. Get up and walk/stretch for 10 minutes per hour. After 5 days you can increase your activity slowly as tolerated. Walk with your walker as instructed. Use the walker until you are comfortable transitioning to a cane. Walk with the cane in the opposite hand of the operative leg. You may discontinue the cane once you are comfortable and walking steadily. Avoid periods of inactivity such as sitting longer than an hour when not asleep. This helps prevent blood clots.  You may discontinue the knee immobilizer once you are able to perform a straight leg raise while lying down. You may resume a sexual relationship in one month or when given the OK by your doctor.  You may return  to work once you are cleared by your doctor.  Do not drive a car for 6 weeks or until released by your surgeon.  Do not drive while taking narcotics.  TED HOSE STOCKINGS Wear the elastic stockings on both legs for three weeks following surgery during the day. You may remove them at night for sleeping.  WEIGHT BEARING Weight bearing as tolerated with assist device (walker, cane,  etc) as directed, use it as long as suggested by your surgeon or therapist, typically at least 4-6 weeks.  POSTOPERATIVE CONSTIPATION PROTOCOL Constipation - defined medically as fewer than three stools per week and severe constipation as less than one stool per week.  One of the most common issues patients have following surgery is constipation.  Even if you have a regular bowel pattern at home, your normal regimen is likely to be disrupted due to multiple reasons following surgery.  Combination of anesthesia, postoperative narcotics, change in appetite and fluid intake all can affect your bowels.  In order to avoid complications following surgery, here are some recommendations in order to help you during your recovery period.  Colace (docusate) - Pick up an over-the-counter form of Colace or another stool softener and take twice a day as long as you are requiring postoperative pain medications.  Take with a full glass of water daily.  If you experience loose stools or diarrhea, hold the colace until you stool forms back up. If your symptoms do not get better within 1 week or if they get worse, check with your doctor. Dulcolax (bisacodyl) - Pick up over-the-counter and take as directed by the product packaging as needed to assist with the movement of your bowels.  Take with a full glass of water.  Use this product as needed if not relieved by Colace only.  MiraLax (polyethylene glycol) - Pick up over-the-counter to have on hand. MiraLax is a solution that will increase the amount of water in your bowels to assist with bowel movements.  Take as directed and can mix with a glass of water, juice, soda, coffee, or tea. Take if you go more than two days without a movement. Do not use MiraLax more than once per day. Call your doctor if you are still constipated or irregular after using this medication for 7 days in a row.  If you continue to have problems with postoperative constipation, please contact the  office for further assistance and recommendations.  If you experience "the worst abdominal pain ever" or develop nausea or vomiting, please contact the office immediatly for further recommendations for treatment.  ITCHING If you experience itching with your medications, try taking only a single pain pill, or even half a pain pill at a time.  You can also use Benadryl over the counter for itching or also to help with sleep.   MEDICATIONS See your medication summary on the After Visit Summary that the nursing staff will review with you prior to discharge.  You may have some home medications which will be placed on hold until you complete the course of blood thinner medication.  It is important for you to complete the blood thinner medication as prescribed by your surgeon.  Continue your approved medications as instructed at time of discharge.  PRECAUTIONS If you experience chest pain or shortness of breath - call 911 immediately for transfer to the hospital emergency department.  If you develop a fever greater that 101 F, purulent drainage from wound, increased redness or drainage from wound, foul odor  from the wound/dressing, or calf pain - CONTACT YOUR SURGEON.                                                   FOLLOW-UP APPOINTMENTS Make sure you keep all of your appointments after your operation with your surgeon and caregivers. You should call the office at the above phone number and make an appointment for approximately two weeks after the date of your surgery or on the date instructed by your surgeon outlined in the "After Visit Summary".  RANGE OF MOTION AND STRENGTHENING EXERCISES  Rehabilitation of the knee is important following a knee injury or an operation. After just a few days of immobilization, the muscles of the thigh which control the knee become weakened and shrink (atrophy). Knee exercises are designed to build up the tone and strength of the thigh muscles and to improve knee  motion. Often times heat used for twenty to thirty minutes before working out will loosen up your tissues and help with improving the range of motion but do not use heat for the first two weeks following surgery. These exercises can be done on a training (exercise) mat, on the floor, on a table or on a bed. Use what ever works the best and is most comfortable for you Knee exercises include:  Leg Lifts - While your knee is still immobilized in a splint or cast, you can do straight leg raises. Lift the leg to 60 degrees, hold for 3 sec, and slowly lower the leg. Repeat 10-20 times 2-3 times daily. Perform this exercise against resistance later as your knee gets better.  Quad and Hamstring Sets - Tighten up the muscle on the front of the thigh (Quad) and hold for 5-10 sec. Repeat this 10-20 times hourly. Hamstring sets are done by pushing the foot backward against an object and holding for 5-10 sec. Repeat as with quad sets.  Leg Slides: Lying on your back, slowly slide your foot toward your buttocks, bending your knee up off the floor (only go as far as is comfortable). Then slowly slide your foot back down until your leg is flat on the floor again. Angel Wings: Lying on your back spread your legs to the side as far apart as you can without causing discomfort.  A rehabilitation program following serious knee injuries can speed recovery and prevent re-injury in the future due to weakened muscles. Contact your doctor or a physical therapist for more information on knee rehabilitation.   POST-OPERATIVE OPIOID TAPER INSTRUCTIONS: It is important to wean off of your opioid medication as soon as possible. If you do not need pain medication after your surgery it is ok to stop day one. Opioids include: Codeine, Hydrocodone(Norco, Vicodin), Oxycodone(Percocet, oxycontin) and hydromorphone amongst others.  Long term and even short term use of opiods can cause: Increased pain  response Dependence Constipation Depression Respiratory depression And more.  Withdrawal symptoms can include Flu like symptoms Nausea, vomiting And more Techniques to manage these symptoms Hydrate well Eat regular healthy meals Stay active Use relaxation techniques(deep breathing, meditating, yoga) Do Not substitute Alcohol to help with tapering If you have been on opioids for less than two weeks and do not have pain than it is ok to stop all together.  Plan to wean off of opioids This plan should start within one week  post op of your joint replacement. Maintain the same interval or time between taking each dose and first decrease the dose.  Cut the total daily intake of opioids by one tablet each day Next start to increase the time between doses. The last dose that should be eliminated is the evening dose.   IF YOU ARE TRANSFERRED TO A SKILLED REHAB FACILITY If the patient is transferred to a skilled rehab facility following release from the hospital, a list of the current medications will be sent to the facility for the patient to continue.  When discharged from the skilled rehab facility, please have the facility set up the patient's B[place] prior to being released. Also, the skilled facility will be responsible for providing the patient with their medications at time of release from the facility to include their pain medication, the muscle relaxants, and their blood thinner medication. If the patient is still at the rehab facility at time of the two week follow up appointment, the skilled rehab facility will also need to assist the patient in arranging follow up appointment in our office and any transportation needs.  MAKE SURE YOU:  Understand these instructions.  Get help right away if you are not doing well or get worse.   DENTAL ANTIBIOTICS:  In most cases prophylactic antibiotics for Dental procdeures after total joint surgery are not  necessary.  Exceptions are as follows:  1. History of prior total joint infection  2. Severely immunocompromised (Organ Transplant, cancer chemotherapy, Rheumatoid biologic meds such as [place])  3. Poorly controlled diabetes (A1C &gt; 8.0, blood glucose over 200)  If you have one of these conditions, contact your surgeon for an antibiotic prescription, prior to your dental procedure.    Pick up stool softner and laxative for home use following surgery while on pain medications. Do not submerge incision under water. Please use good hand washing techniques while changing dressing each day. May shower starting three days after surgery. Please use a clean towel to pat the incision dry following showers. Continue to use ice for pain and swelling after surgery. Do not use any lotions or creams on the incision until instructed by your surgeon.

## 2021-03-05 NOTE — Progress Notes (Signed)
[facility] for Warfarin Indication: h/o pulmonary embolus  Allergies  Allergen Reactions   Augmentin [Amoxicillin-Pot Clavulanate] Diarrhea   Vioxx [Rofecoxib] Swelling    EXTREME LEG SWELLING    Bactrim [Sulfamethoxazole-Trimethoprim] Diarrhea and Nausea And Vomiting   Banana Itching and Rash   Cephalosporins Diarrhea, Nausea And Vomiting and Other (See Comments)    Cephalexin doses > 250mg  cause abdominal pain   Ciprofloxacin Diarrhea and Nausea And Vomiting   Coconut Flavor Itching and Rash   Codeine Nausea Only   Zithromax [Azithromycin] Diarrhea and Nausea And Vomiting    Patient Measurements: Weight: 80.7 kg (178 lb)  Vital Signs: Temp: 98.3 F (36.8 C) (01/16 0855) Temp Source: Oral (01/16 0537) BP: 123/60 (01/16 0930) Pulse Rate: 69 (01/16 0930)  Labs: Recent Labs    03/05/21 0534  APTT 30  LABPROT 13.2  INR 1.0    Estimated Creatinine Clearance: 60.2 mL/min (A) (by C-G formula based on SCr of 0.42 mg/dL (L)).   Medical History: Past Medical History:  Diagnosis Date   Anemia    take iron   Anxiety    Arthritis    Depression    Dysrhythmia 2016   occ due to caffeine intake   Encounter for long-term (current) use of other medications    GERD (gastroesophageal reflux disease)    Hepatitis    age 29   History of hiatal hernia    Hypertension    IBS (irritable bowel syndrome)    Obesity    Osteoarthritis    Pain in joint, multiple sites    Pneumonia 2010   PONV (postoperative nausea and vomiting)    in the 1980's no problems since   Psoriatic arthropathy (HCC)    Pulmonary embolism ([facility]) 12/03/2016   submassive/notes 12/03/2016   Raynaud disease     Medications:  PTA Warfarin 6 mg daily last dose 02/27/21  Assessment: 71 y/o F with a h/o PE on chronic warfarin admitted for TKA to resume warfarin with Lovenox for VTE prophylaxis post-op until target INR achieved with > or = 5 days of overlap.   Goal of  Therapy:  INR 2-3   Plan:  Warfarin 6 mg x 1 today.  Lovenox 40 mg daily per MD.  Daily INR  Ulice Dash D 03/05/2021,9:42 AM

## 2021-03-05 NOTE — Transfer of Care (Signed)
Immediate Anesthesia Transfer of Care Note  Patient: [name]  Procedure(s) Performed: Procedure(s): TOTAL KNEE ARTHROPLASTY (Right)  Patient Location: PACU  Anesthesia Type:Spinal  Level of Consciousness: awake, alert  and oriented  Airway & Oxygen Therapy: Patient Spontanous Breathing  Post-op Assessment: Report given to RN and Post -op Vital signs reviewed and stable  Post vital signs: Reviewed and stable  Last Vitals:  Vitals:   03/05/21 0537  BP: 139/77  Pulse: 73  Resp: 17  Temp: 36.6 C  SpO2: 939%    Complications: No apparent anesthesia complications

## 2021-03-05 NOTE — Progress Notes (Signed)
Orthopedic Tech Progress Note Patient Details:  [name] [birth year] [id number]  CPM Right Knee CPM Right Knee: On Right Knee Flexion (Degrees): 40 Right Knee Extension (Degrees): 10  Post Interventions Patient Tolerated: Well  Vernona Rieger 03/05/2021, 11:08 AM

## 2021-03-05 NOTE — Care Plan (Signed)
Ortho Bundle Case Management Note  Patient Details  Name: [name] MRN: [id number] Date of Birth: 05/22/1950  R TKA on 03-05-21 DCP:  Home with husband. DME:  RW ordered through [facility] PT:  [facility] on 03-08-21                   DME Arranged:  N/A DME Agency:  NA  HH Arranged:  NA HH Agency:  NA  Additional Comments: Please contact me with any questions of if this plan should need to change.  Marianne Sofia, RN,CCM [facility]  [phone] 03/05/2021, 10:53 AM

## 2021-03-05 NOTE — Anesthesia Postprocedure Evaluation (Signed)
Anesthesia Post Note  Patient: TASHEIKA KITZMILLER  Procedure(s) Performed: TOTAL KNEE ARTHROPLASTY (Right: Knee)     Patient location during evaluation: PACU Anesthesia Type: Spinal and Regional Level of consciousness: oriented and awake and alert Pain management: pain level controlled Vital Signs Assessment: post-procedure vital signs reviewed and stable Respiratory status: spontaneous breathing, respiratory function stable and patient connected to nasal cannula oxygen Cardiovascular status: blood pressure returned to baseline and stable Postop Assessment: no headache, no backache, no apparent nausea or vomiting, spinal receding and patient able to bend at knees Anesthetic complications: no   No notable events documented.  Last Vitals:  Vitals:   03/05/21 1000 03/05/21 1042  BP: 131/67 114/72  Pulse: 63 69  Resp: 14 16  Temp: 36.4 C 36.7 C  SpO2: 100% 100%    Last Pain:  Vitals:   03/05/21 1042  TempSrc: Oral  PainSc: 0-No pain                 Lennix Kneisel,W. EDMOND

## 2021-03-06 ENCOUNTER — Encounter (HOSPITAL_COMMUNITY): Payer: Self-pay | Admitting: Orthopedic Surgery

## 2021-03-06 DIAGNOSIS — Z7901 Long term (current) use of anticoagulants: Secondary | ICD-10-CM | POA: Diagnosis not present

## 2021-03-06 DIAGNOSIS — Z86711 Personal history of pulmonary embolism: Secondary | ICD-10-CM | POA: Diagnosis not present

## 2021-03-06 DIAGNOSIS — I10 Essential (primary) hypertension: Secondary | ICD-10-CM | POA: Diagnosis not present

## 2021-03-06 DIAGNOSIS — Z96651 Presence of right artificial knee joint: Secondary | ICD-10-CM | POA: Diagnosis not present

## 2021-03-06 DIAGNOSIS — M1711 Unilateral primary osteoarthritis, right knee: Secondary | ICD-10-CM | POA: Diagnosis not present

## 2021-03-06 DIAGNOSIS — Z96611 Presence of right artificial shoulder joint: Secondary | ICD-10-CM | POA: Diagnosis not present

## 2021-03-06 DIAGNOSIS — Z96652 Presence of left artificial knee joint: Secondary | ICD-10-CM | POA: Diagnosis not present

## 2021-03-06 DIAGNOSIS — Z79899 Other long term (current) drug therapy: Secondary | ICD-10-CM | POA: Diagnosis not present

## 2021-03-06 LAB — CBC
HCT: 32.9 % — ABNORMAL LOW (ref 36.0–46.0)
Hemoglobin: 10.3 g/dL — ABNORMAL LOW (ref 12.0–15.0)
MCH: 26.6 pg (ref 26.0–34.0)
MCHC: 31.3 g/dL (ref 30.0–36.0)
MCV: 85 fL (ref 80.0–100.0)
Platelets: 180 10*3/uL (ref 150–400)
RBC: 3.87 MIL/uL (ref 3.87–5.11)
RDW: 15.9 % — ABNORMAL HIGH (ref 11.5–15.5)
WBC: 7.6 10*3/uL (ref 4.0–10.5)
nRBC: 0 % (ref 0.0–0.2)

## 2021-03-06 LAB — BASIC METABOLIC PANEL
Anion gap: 4 — ABNORMAL LOW (ref 5–15)
BUN: 11 mg/dL (ref 8–23)
CO2: 24 mmol/L (ref 22–32)
Calcium: 8.3 mg/dL — ABNORMAL LOW (ref 8.9–10.3)
Chloride: 106 mmol/L (ref 98–111)
Creatinine, Ser: 0.56 mg/dL (ref 0.44–1.00)
GFR, Estimated: >60 mL/min (ref >60)
Glucose, Bld: 175 mg/dL — ABNORMAL HIGH (ref 70–99)
Potassium: 3.7 mmol/L (ref 3.5–5.1)
Sodium: 134 mmol/L — ABNORMAL LOW (ref 135–145)

## 2021-03-06 LAB — PROTIME-INR
INR: 1.1 (ref 0.8–1.2)
Prothrombin Time: 13.8 seconds (ref 11.4–15.2)

## 2021-03-06 MED ORDER — METHOCARBAMOL 500 MG PO TABS: 500.0000 mg | ORAL_TABLET | Freq: Four times a day (QID) | ORAL | 0 refills | Status: DC | PRN

## 2021-03-06 MED ORDER — WARFARIN SODIUM 5 MG PO TABS: 7.5000 mg | ORAL_TABLET | Freq: Once | ORAL | Status: DC

## 2021-03-06 MED ORDER — HYDROMORPHONE HCL 2 MG PO TABS: 2.0000 mg | ORAL_TABLET | Freq: Four times a day (QID) | ORAL | 0 refills | Status: DC | PRN

## 2021-03-06 MED ORDER — ENOXAPARIN SODIUM 40 MG/0.4ML IJ SOSY: 40.0000 mg | PREFILLED_SYRINGE | INTRAMUSCULAR | 0 refills | Status: DC

## 2021-03-06 NOTE — Progress Notes (Signed)
Subjective: 1 Day Post-Op Procedure(s) (LRB): TOTAL KNEE ARTHROPLASTY (Right) Patient reports pain as mild.   Patient seen in rounds by Dr. Wynelle Link. Patient is well, and has had no acute complaints or problems No issues overnight other than pain which is improved this morning. Denies chest pain, SOB, or calf pain. Foley catheter removed this AM.  We will continue therapy today, ambulated 16' yesterday.   Objective: Vital signs in last 24 hours: Temp:  [97.6 F (36.4 C)-98.6 F (37 C)] 97.7 F (36.5 C) (01/17 0532) Pulse Rate:  [63-76] 66 (01/17 0532) Resp:  [14-18] 18 (01/17 0532) BP: (102-131)/(53-72) 121/53 (01/17 0532) SpO2:  [96 %-100 %] 97 % (01/17 0532) Weight:  [80 kg] 80 kg (01/16 1648)  Intake/Output from previous day:  Intake/Output Summary (Last 24 hours) at 03/06/2021 0759 Last data filed at 03/06/2021 0532 Gross per 24 hour  Intake 1800.85 ml  Output 2800 ml  Net -999.15 ml     Intake/Output this shift: No intake/output data recorded.  Labs: Recent Labs    03/06/21 0312  HGB 10.3*   Recent Labs    03/06/21 0312  WBC 7.6  RBC 3.87  HCT 32.9*  PLT 180   Recent Labs    03/06/21 0312  NA 134*  K 3.7  CL 106  CO2 24  BUN 11  CREATININE 0.56  GLUCOSE 175*  CALCIUM 8.3*   Recent Labs    03/05/21 0534 03/06/21 0312  INR 1.0 1.1    Exam: General - Patient is Alert and Oriented Extremity - Neurologically intact Neurovascular intact Sensation intact distally Dorsiflexion/Plantar flexion intact Dressing - dressing C/D/I Motor Function - intact, moving foot and toes well on exam.   Past Medical History:  Diagnosis Date   Anemia    take iron   Anxiety    Arthritis    Depression    Dysrhythmia 2016   occ due to caffeine intake   Encounter for long-term (current) use of other medications    GERD (gastroesophageal reflux disease)    Hepatitis    age 71   History of hiatal hernia    Hypertension    IBS (irritable bowel syndrome)     Obesity    Osteoarthritis    Pain in joint, multiple sites    Pneumonia 2010   PONV (postoperative nausea and vomiting)    in the 1980's no problems since   Psoriatic arthropathy (HCC)    Pulmonary embolism ([place]) 12/03/2016   submassive/notes 12/03/2016   Raynaud disease     Assessment/Plan: 1 Day Post-Op Procedure(s) (LRB): TOTAL KNEE ARTHROPLASTY (Right) Principal Problem:   OA (osteoarthritis) of knee Active Problems:   Primary osteoarthritis of right knee  Estimated body mass index is 36.52 kg/m as calculated from the following:   Height as of this encounter: 4' 10.27" (1.48 m).   Weight as of this encounter: 80 kg. Advance diet Up with therapy D/C IV fluids   Patient's anticipated LOS is less than 2 midnights, meeting these requirements: - Younger than 29 - Lives within 1 hour of care - Has a competent adult at home to recover with post-op recover - NO history of  - Chronic pain requiring opiods  - Diabetes  - Coronary Artery Disease  - Heart failure  - Heart attack  - Stroke  - DVT/VTE  - Cardiac arrhythmia  - Respiratory Failure/COPD  - Renal failure  - Anemia  - Advanced Liver disease     DVT Prophylaxis -  Lovenox and Coumadin *Will discharge home with four days of lovenox. Patient will schedule appt with Dr. Nori Riis later this week for PT/INR check, may d/c the lovenox once therapeutic.*  Weight bearing as tolerated. Continue therapy.  Plan is to go Home after hospital stay. Plan for discharge later today if progresses with therapy and meeting goals. Scheduled for OPPT at [facility]. Follow-up in the office in 2 weeks.  The PDMP database was reviewed today prior to any opioid medications being prescribed to this patient.  Theresa Duty, PA-C Orthopedic Surgery [phone] 03/06/2021, 7:59 AM

## 2021-03-06 NOTE — Progress Notes (Signed)
Physical Therapy Treatment Patient Details Name: [name] MRN: [id number] DOB: [birth year] Today's Date: 03/06/2021   History of Present Illness 71 yo female s/p R TKA on 03/05/20. PMH: R shoulder hemiarthroplasty, L TKA &  revision, HTN, neuropathy    PT Comments    Pt progressing well today. Will see for second session to review HEP and pt should be ready to d/c later todday   Recommendations for follow up therapy are one component of a multi-disciplinary discharge planning process, led by the attending physician.  Recommendations may be updated based on patient status, additional functional criteria and insurance authorization.  Follow Up Recommendations  Follow physician's recommendations for discharge plan and follow up therapies     Assistance Recommended at Discharge Intermittent Supervision/Assistance  Patient can return home with the following Help with stairs or ramp for entrance;A little help with walking and/or transfers   Equipment Recommendations  Rolling walker (2 wheels) (youth ht)    Recommendations for Other Services       Precautions / Restrictions Precautions Precautions: Fall;Knee Precaution Comments: pt endorses multiple falls Required Braces or Orthoses: Knee Immobilizer - Right Knee Immobilizer - Right: Discontinue once straight leg raise with < 10 degree lag Restrictions Weight Bearing Restrictions: No RLE Weight Bearing: Weight bearing as tolerated     Mobility  Bed Mobility               General bed mobility comments: in recliner    Transfers Overall transfer level: Needs assistance Equipment used: Rolling walker (2 wheels) Transfers: Sit to/from Stand Sit to Stand: Min guard           General transfer comment: cues for hand placement and RLE position    Ambulation/Gait Ambulation/Gait assistance: Min guard Gait Distance (Feet): 100 Feet Assistive device: Rolling walker (2 wheels) Gait Pattern/deviations: Step-to  pattern, Trunk flexed       General Gait Details: cues for sequence and RW position, trunk extension as able   Stairs Stairs: Yes Stairs assistance: Min guard Stair Management: Two rails, Step to pattern, Forwards Number of Stairs: 5 (x2) General stair comments: cues for sequence and technqiue. reviewed with husband verbally and both he and pt feel comfortable with technique   Wheelchair Mobility    Modified Rankin (Stroke Patients Only)       Balance                                            Cognition Arousal/Alertness: Awake/alert Behavior During Therapy: WFL for tasks assessed/performed Overall Cognitive Status: Within Functional Limits for tasks assessed                                          Exercises      General Comments        Pertinent Vitals/Pain Pain Assessment Pain Assessment: 0-10 Pain Score: 4  Pain Location: right knee Pain Descriptors / Indicators: Sore Pain Intervention(s): Limited activity within patient's tolerance, Monitored during session, Premedicated before session, Repositioned, Ice applied    Home Living                          Prior Function            PT Goals (  current goals can now be found in the care plan section) Acute Rehab PT Goals Patient Stated Goal: walk better PT Goal Formulation: With patient Time For Goal Achievement: 03/12/21 Potential to Achieve Goals: Good Progress towards PT goals: Progressing toward goals    Frequency    7X/week      PT Plan Current plan remains appropriate    Co-evaluation              AM-PAC PT "6 Clicks" Mobility   Outcome Measure  Help needed turning from your back to your side while in a flat bed without using bedrails?: A Little Help needed moving from lying on your back to sitting on the side of a flat bed without using bedrails?: A Little Help needed moving to and from a bed to a chair (including a wheelchair)?: A  Little Help needed standing up from a chair using your arms (e.g., wheelchair or bedside chair)?: A Little Help needed to walk in hospital room?: A Little Help needed climbing 3-5 steps with a railing? : A Little 6 Click Score: 18    End of Session Equipment Utilized During Treatment: Gait belt;Right knee immobilizer Activity Tolerance: Patient tolerated treatment well Patient left: in chair;with call bell/phone within reach;with family/visitor present Nurse Communication: Mobility status PT Visit Diagnosis: Other abnormalities of gait and mobility (R26.89);Difficulty in walking, not elsewhere classified (R26.2);Repeated falls (R29.6)     Time: 4469-5072 PT Time Calculation (min) (ACUTE ONLY): 19 min  Charges:  $Gait Training: 8-22 mins                     [name], PT  Acute Rehab Dept ([place]) [phone] Pager [phone]  03/06/2021    [facility] 03/06/2021, 10:18 AM

## 2021-03-06 NOTE — Progress Notes (Signed)
Physical Therapy Treatment Patient Details Name: [name] MRN: [id number] DOB: [birth year] Today's Date: 03/06/2021   History of Present Illness 71 yo female s/p R TKA on 03/05/20. PMH: R shoulder hemiarthroplasty, L TKA &  revision, HTN, neuropathy    PT Comments    Pt progressing well. Spouse present for review of TKA HEP and discussed activity progression at home. Pt is ready to d/c from PT standpoint.   Recommendations for follow up therapy are one component of a multi-disciplinary discharge planning process, led by the attending physician.  Recommendations may be updated based on patient status, additional functional criteria and insurance authorization.  Follow Up Recommendations  Follow physician's recommendations for discharge plan and follow up therapies     Assistance Recommended at Discharge Intermittent Supervision/Assistance  Patient can return home with the following Help with stairs or ramp for entrance;A little help with walking and/or transfers   Equipment Recommendations  Rolling walker (2 wheels)    Recommendations for Other Services       Precautions / Restrictions Precautions Precautions: Fall;Knee Precaution Comments: pt endorses multiple falls; reviewed use of KI, precautions. pt able to perform Independent SLRs today Required Braces or Orthoses: Knee Immobilizer - Right Knee Immobilizer - Right: Discontinue once straight leg raise with < 10 degree lag Restrictions Weight Bearing Restrictions: No     Mobility  Bed Mobility               General bed mobility comments: in recliner    Transfers Overall transfer level: Needs assistance Equipment used: Rolling walker (2 wheels) Transfers: Sit to/from Stand Sit to Stand: Min guard           General transfer comment: cues for hand placement and RLE position    Ambulation/Gait Ambulation/Gait assistance: Min guard Gait Distance (Feet): 100 Feet Assistive device: Rolling walker (2  wheels) Gait Pattern/deviations: Step-to pattern, Trunk flexed       General Gait Details: cues for sequence and RW position, trunk extension as able   Stairs Stairs: Yes Stairs assistance: Min guard Stair Management: Two rails, Step to pattern, Forwards Number of Stairs: 5 (x2) General stair comments: cues for sequence and technqiue. reviewed with husband verbally and both he and pt feel comfortable with technique   Wheelchair Mobility    Modified Rankin (Stroke Patients Only)       Balance                                            Cognition Arousal/Alertness: Awake/alert Behavior During Therapy: WFL for tasks assessed/performed Overall Cognitive Status: Within Functional Limits for tasks assessed                                          Exercises Total Joint Exercises Ankle Circles/Pumps: AROM, Both, 10 reps Quad Sets: AROM, Both, 10 reps Heel Slides: AAROM, Right, 10 reps Hip ABduction/ADduction: AROM, AAROM, Right, 10 reps Straight Leg Raises: AROM, AAROM, Right, 10 reps    General Comments        Pertinent Vitals/Pain Pain Assessment Pain Assessment: 0-10 Pain Score: 5  Pain Location: right knee Pain Descriptors / Indicators: Sore Pain Intervention(s): Limited activity within patient's tolerance, Monitored during session, Premedicated before session, Repositioned, Patient requesting pain meds-RN notified  Home Living                          Prior Function            PT Goals (current goals can now be found in the care plan section) Acute Rehab PT Goals Patient Stated Goal: walk better PT Goal Formulation: With patient Time For Goal Achievement: 03/12/21 Potential to Achieve Goals: Good Progress towards PT goals: Progressing toward goals    Frequency    7X/week      PT Plan Current plan remains appropriate    Co-evaluation              AM-PAC PT "6 Clicks" Mobility   Outcome  Measure  Help needed turning from your back to your side while in a flat bed without using bedrails?: A Little Help needed moving from lying on your back to sitting on the side of a flat bed without using bedrails?: A Little Help needed moving to and from a bed to a chair (including a wheelchair)?: A Little Help needed standing up from a chair using your arms (e.g., wheelchair or bedside chair)?: A Little Help needed to walk in hospital room?: A Little Help needed climbing 3-5 steps with a railing? : A Little 6 Click Score: 18    End of Session   Activity Tolerance: Patient tolerated treatment well Patient left: in bed;with call bell/phone within reach;with bed alarm set;with family/visitor present Nurse Communication: Mobility status PT Visit Diagnosis: Other abnormalities of gait and mobility (R26.89);Difficulty in walking, not elsewhere classified (R26.2);Repeated falls (R29.6)     Time: 0034-9179 PT Time Calculation (min) (ACUTE ONLY): 30 min  Charges:  $Therapeutic Exercise: 23-37 mins                     [name], PT  Acute Rehab Dept ([place]) [phone] Pager [phone]  03/06/2021    [facility] 03/06/2021, 2:14 PM

## 2021-03-06 NOTE — TOC Transition Note (Signed)
Transition of Care [facility]) - CM/SW Discharge Note  Patient Details  Name: [name] MRN: [id number] Date of Birth: [birth year]  [facility] CM/SW Contact:  Sherie Don, LCSW Phone Number: 03/06/2021, 9:53 AM  Clinical Narrative: Patient is expected to discharge home after working with PT. CSW met with patient to confirm discharge plan and needs. Patient will discharge home with OPPT at [facility] with the first appointment scheduled for 03/08/21. Patient will need a youth rolling walker, which [employer] delivered to patient's room. TOC signing off.  Final next level of care: OP Rehab Barriers to Discharge: No Barriers Identified  Patient Goals and CMS Choice Patient states their goals for this hospitalization and ongoing recovery are:: Discharge home with OPPT at [facility] CMS Medicare.gov Compare Post Acute Care list provided to:: Patient Choice offered to / list presented to : Patient  Discharge Plan and Services       DME Arranged: [facility] youth DME Agency: [employer] Representative spoke with at DME Agency: Prearranged with [employer] HH Arranged: NA [place] Agency: NA  Readmission Risk Interventions No flowsheet data found.

## 2021-03-06 NOTE — Progress Notes (Signed)
ANTICOAGULATION CONSULT NOTE - Follow Up Consult  Pharmacy Consult for Warfarin Indication: Hx of pulmonary embolus  Allergies  Allergen Reactions   Augmentin [Amoxicillin-Pot Clavulanate] Diarrhea   Vioxx [Rofecoxib] Swelling    EXTREME LEG SWELLING    Bactrim [Sulfamethoxazole-Trimethoprim] Diarrhea and Nausea And Vomiting   Banana Itching and Rash   Cephalosporins Diarrhea, Nausea And Vomiting and Other (See Comments)    Cephalexin doses > 250mg  cause abdominal pain   Ciprofloxacin Diarrhea and Nausea And Vomiting   Coconut Flavor Itching and Rash   Codeine Nausea Only   Zithromax [Azithromycin] Diarrhea and Nausea And Vomiting    Patient Measurements: Height: 4' 10.27" (148 cm) Weight: 80 kg (176 lb 5.9 oz) IBW/kg (Calculated) : 41.52  Vital Signs: Temp: 97.7 F (36.5 C) (01/17 0532) Temp Source: Oral (01/17 0532) BP: 121/53 (01/17 0532) Pulse Rate: 66 (01/17 0532)  Labs: Recent Labs    03/05/21 0534 03/06/21 0312  HGB  --  10.3*  HCT  --  32.9*  PLT  --  180  APTT 30  --   LABPROT 13.2 13.8  INR 1.0 1.1  CREATININE  --  0.56    Estimated Creatinine Clearance: 59.6 mL/min (by C-G formula based on SCr of 0.56 mg/dL).   Medications:  Scheduled:   acetaminophen  1,000 mg Oral Q6H   dexamethasone (DECADRON) injection  10 mg Intravenous Once   docusate sodium  100 mg Oral BID   enoxaparin (LOVENOX) injection  40 mg Subcutaneous Q24H   furosemide  40 mg Oral Daily   gabapentin  100 mg Oral BID   pantoprazole  40 mg Oral Daily   tranexamic acid (CYKLOKAPRON) topical - INTRAOP  2,000 mg Topical Once   venlafaxine XR  150 mg Oral Q breakfast   venlafaxine XR  75 mg Oral Q breakfast   Warfarin - Pharmacist Dosing Inpatient   Does not apply q1600   Infusions:   sodium chloride 75 mL/hr at 03/05/21 2138   methocarbamol (ROBAXIN) IV     PRN: ALPRAZolam, bisacodyl, diphenhydrAMINE, HYDROmorphone, loperamide, menthol-cetylpyridinium **OR** phenol, methocarbamol  **OR** methocarbamol (ROBAXIN) IV, metoCLOPramide **OR** metoCLOPramide (REGLAN) injection, morphine injection, ondansetron **OR** ondansetron (ZOFRAN) IV, polyethylene glycol, sodium phosphate  Assessment: 71 y/o F with a h/o PE on chronic warfarin admitted for TKA to resume warfarin with Lovenox for VTE prophylaxis post-op until target INR achieved with > or = 5 days of overlap.   PTA warfarin dose is 6mg  daily - last dose taken PTA 1/10  Today, 03/06/2021 INR 1.1 after resuming warfarin 6mg  yesterday post-op Hgb 10.3, decreased as expected from 12.1 pre-op; Plts wnl No bleeding reported No drug-drug interactions noted  Goal of Therapy:  INR 2-3   Plan:  Boost warfarin dose 7.5mg  PO x 1 today Lovenox 40mg  SQ daily per MD Daily PT/INR  Peggyann Juba, PharmD, BCPS Pharmacy: [phone] 03/06/2021,7:28 AM

## 2021-03-09 ENCOUNTER — Other Ambulatory Visit (INDEPENDENT_AMBULATORY_CARE_PROVIDER_SITE_OTHER): Payer: Medicare Other

## 2021-03-09 ENCOUNTER — Other Ambulatory Visit: Payer: Self-pay

## 2021-03-09 DIAGNOSIS — Z7901 Long term (current) use of anticoagulants: Secondary | ICD-10-CM

## 2021-03-09 LAB — POCT INR: INR: 1.2 — AB (ref 2.0–3.0)

## 2021-03-09 NOTE — Progress Notes (Signed)
Patient here for INR check post knee replacement surgery. Restarted coumadin Monday 03/05/2021 at 6 mg daily. Take 8 mg x 2 days, 6 mg on Sunday and take last two doses of Lovenox today and tomorrow. Follow up on Monday for repeat INR. [name]

## 2021-03-12 ENCOUNTER — Other Ambulatory Visit: Payer: Self-pay

## 2021-03-12 ENCOUNTER — Other Ambulatory Visit (INDEPENDENT_AMBULATORY_CARE_PROVIDER_SITE_OTHER): Payer: Medicare Other

## 2021-03-12 DIAGNOSIS — M25661 Stiffness of right knee, not elsewhere classified: Secondary | ICD-10-CM | POA: Diagnosis not present

## 2021-03-12 DIAGNOSIS — Z7901 Long term (current) use of anticoagulants: Secondary | ICD-10-CM | POA: Diagnosis not present

## 2021-03-12 DIAGNOSIS — M25561 Pain in right knee: Secondary | ICD-10-CM | POA: Diagnosis not present

## 2021-03-12 LAB — POCT INR: INR: 2.3 (ref 2.0–3.0)

## 2021-03-12 NOTE — Discharge Summary (Signed)
Patient ID: [name] MRN: [id number] DOB/AGE: 69-Jan-1952 69 y.o.  Admit date: 03/05/2021 Discharge date: 03/06/2021  Admission Diagnoses:  Principal Problem:   OA (osteoarthritis) of knee Active Problems:   Primary osteoarthritis of right knee   Discharge Diagnoses:  Same  Past Medical History:  Diagnosis Date   Anemia    take iron   Anxiety    Arthritis    Depression    Dysrhythmia 2016   occ due to caffeine intake   Encounter for long-term (current) use of other medications    GERD (gastroesophageal reflux disease)    Hepatitis    age 8   History of hiatal hernia    Hypertension    IBS (irritable bowel syndrome)    Obesity    Osteoarthritis    Pain in joint, multiple sites    Pneumonia 2010   PONV (postoperative nausea and vomiting)    in the 1980's no problems since   Psoriatic arthropathy ([place])    Pulmonary embolism ([place]) 12/03/2016   submassive/notes 12/03/2016   Raynaud disease     Surgeries: Procedure(s): TOTAL KNEE ARTHROPLASTY on 03/05/2021   Consultants:   Discharged Condition: Improved  Hospital Course: [name] is an 71 y.o. female who was admitted 03/05/2021 for operative treatment ofOA (osteoarthritis) of knee. Patient has severe unremitting pain that affects sleep, daily activities, and work/hobbies. After pre-op clearance the patient was taken to the operating room on 03/05/2021 and underwent  Procedure(s): TOTAL KNEE ARTHROPLASTY.    Patient was given perioperative antibiotics:  Anti-infectives (From admission, onward)    Start     Dose/Rate Route Frequency Ordered Stop   03/05/21 2000  vancomycin (VANCOCIN) IVPB 1000 mg/200 mL premix        1,000 mg 200 mL/hr over 60 Minutes Intravenous Every 12 hours 03/05/21 1038 03/05/21 2240   03/05/21 0600  vancomycin (VANCOCIN) IVPB 1000 mg/200 mL premix        1,000 mg 200 mL/hr over 60 Minutes Intravenous On call to O.R. 03/05/21 0530 03/05/21 0815   03/05/21 0600  vancomycin  (VANCOREADY) IVPB 1500 mg/300 mL  Status:  Discontinued        1,500 mg 150 mL/hr over 120 Minutes Intravenous On call to O.R. 03/05/21 0530 03/05/21 0533        Patient was given sequential compression devices, early ambulation, and chemoprophylaxis to prevent DVT.  Patient benefited maximally from hospital stay and there were no complications.    Recent vital signs: No data found.   Recent laboratory studies:  Recent Labs    03/09/21 1126  INR 1.2*     Discharge Medications:   Allergies as of 03/06/2021       Reactions   Augmentin [amoxicillin-pot Clavulanate] Diarrhea   Vioxx [rofecoxib] Swelling   EXTREME LEG SWELLING    Bactrim [sulfamethoxazole-trimethoprim] Diarrhea, Nausea And Vomiting   Banana Itching, Rash   Cephalosporins Diarrhea, Nausea And Vomiting, Other (See Comments)   Cephalexin doses > 250mg  cause abdominal pain   Ciprofloxacin Diarrhea, Nausea And Vomiting   Coconut Flavor Itching, Rash   Codeine Nausea Only   Zithromax [azithromycin] Diarrhea, Nausea And Vomiting        Medication List     STOP taking these medications    diclofenac 75 MG EC tablet Commonly known as: VOLTAREN       TAKE these medications    ALPRAZolam 0.5 MG tablet Commonly known as: XANAX Take 1 tablet (0.5 mg total) by mouth 3 (three)  times daily as needed for anxiety.   benazepril 20 MG tablet Commonly known as: LOTENSIN TAKE 1 TABLET BY MOUTH  DAILY   CALCIUM 1200+D3 PO Take 1 tablet by mouth daily.   CORICIDIN HBP COUGH/COLD PO Take 1 tablet by mouth 2 (two) times daily as needed (cold/cough).   enoxaparin 40 MG/0.4ML injection Commonly known as: LOVENOX Inject 0.4 mLs (40 mg total) into the skin daily for 4 days. May discontinue once PT-INR is therapeutic. Schedule appt to check this with Dr. Nori Riis on either Thursday or Friday.   esomeprazole 20 MG capsule Commonly known as: NEXIUM TAKE 1 CAPSULE(20 MG) BY MOUTH DAILY What changed: See the new  instructions.   fluconazole 100 MG tablet Commonly known as: DIFLUCAN TAKE 1 TABLET BY MOUTH ONCE WEEKLY What changed: when to take this   furosemide 20 MG tablet Commonly known as: LASIX TAKE 2 TABLETS BY MOUTH IN  THE MORNING . MAY TAKE 1  TABLET BY MOUTH IN THE  AFTERNOON AS ADDITIONAL  DOSE AS NEEDED What changed:  how much to take how to take this when to take this additional instructions   gabapentin 100 MG capsule Commonly known as: NEURONTIN Take one or two tablets at night as directed What changed:  how much to take how to take this when to take this   HYDROmorphone 2 MG tablet Commonly known as: DILAUDID Take 1-2 tablets (2-4 mg total) by mouth every 6 (six) hours as needed for severe pain or moderate pain.   methocarbamol 500 MG tablet Commonly known as: ROBAXIN Take 1 tablet (500 mg total) by mouth every 6 (six) hours as needed for muscle spasms.   venlafaxine XR 150 MG 24 hr capsule Commonly known as: EFFEXOR-XR TAKE 1 CAPSULE BY MOUTH  DAILY What changed:  when to take this additional instructions   venlafaxine XR 75 MG 24 hr capsule Commonly known as: EFFEXOR-XR TAKE 1 CAPSULE BY MOUTH  DAILY IN ADDITION TO 150 MG CAPSULE FOR 225 MG DAILY What changed: See the new instructions.   warfarin 2 MG tablet Commonly known as: COUMADIN Take as directed. If you are unsure how to take this medication, talk to your nurse or doctor. Original instructions: Take 3 tablets (6 mg total) by mouth daily.               Discharge Care Instructions  (From admission, onward)           Start     Ordered   03/06/21 0000  Weight bearing as tolerated        03/06/21 0804   03/06/21 0000  Change dressing       Comments: You may remove the bulky bandage (ACE wrap and gauze) two days after surgery. You will have an adhesive waterproof bandage underneath. Leave this in place until your first follow-up appointment.   03/06/21 0804            Diagnostic  Studies: MYOCARDIAL PERFUSION IMAGING  Result Date: 02/27/2021   Lexiscan stress is electrically negative for ischemia   Myoview scan shows normal perfusion and very mild soft tissue attenuation   No ischemia or scar   LVEF 60%   Prior study not available for comparison.   Low risk scan    Disposition: Discharge disposition: 01-Home or Self Care       Discharge Instructions     Call MD / Call 911   Complete by: As directed    If you experience chest  pain or shortness of breath, CALL 911 and be transported to the hospital emergency room.  If you develope a fever above 101 F, pus (white drainage) or increased drainage or redness at the wound, or calf pain, call your surgeon's office.   Change dressing   Complete by: As directed    You may remove the bulky bandage (ACE wrap and gauze) two days after surgery. You will have an adhesive waterproof bandage underneath. Leave this in place until your first follow-up appointment.   Constipation Prevention   Complete by: As directed    Drink plenty of fluids.  Prune juice may be helpful.  You may use a stool softener, such as Colace (over the counter) 100 mg twice a day.  Use MiraLax (over the counter) for constipation as needed.   Diet - low sodium heart healthy   Complete by: As directed    Do not put a pillow under the knee. Place it under the heel.   Complete by: As directed    Driving restrictions   Complete by: As directed    No driving for two weeks   Post-operative opioid taper instructions:   Complete by: As directed    POST-OPERATIVE OPIOID TAPER INSTRUCTIONS: It is important to wean off of your opioid medication as soon as possible. If you do not need pain medication after your surgery it is ok to stop day one. Opioids include: Codeine, Hydrocodone(Norco, Vicodin), Oxycodone(Percocet, oxycontin) and hydromorphone amongst others.  Long term and even short term use of opiods can cause: Increased pain  response Dependence Constipation Depression Respiratory depression And more.  Withdrawal symptoms can include Flu like symptoms Nausea, vomiting And more Techniques to manage these symptoms Hydrate well Eat regular healthy meals Stay active Use relaxation techniques(deep breathing, meditating, yoga) Do Not substitute Alcohol to help with tapering If you have been on opioids for less than two weeks and do not have pain than it is ok to stop all together.  Plan to wean off of opioids This plan should start within one week post op of your joint replacement. Maintain the same interval or time between taking each dose and first decrease the dose.  Cut the total daily intake of opioids by one tablet each day Next start to increase the time between doses. The last dose that should be eliminated is the evening dose.      TED hose   Complete by: As directed    Use stockings (TED hose) for three weeks on both leg(s).  You may remove them at night for sleeping.   Weight bearing as tolerated   Complete by: As directed         Follow-up Information     Gaynelle Arabian, MD. Go on 03/20/2021.   Specialty: Orthopedic Surgery Why: You are scheduled for a post-operative appointment on 03-20-21 at 2:00 pm. Contact information: [address] [place] [address] [phone]                  Signed: Theresa Duty 03/12/2021, 8:09 AM

## 2021-03-13 DIAGNOSIS — M25661 Stiffness of right knee, not elsewhere classified: Secondary | ICD-10-CM | POA: Insufficient documentation

## 2021-03-14 DIAGNOSIS — M25661 Stiffness of right knee, not elsewhere classified: Secondary | ICD-10-CM | POA: Diagnosis not present

## 2021-03-16 ENCOUNTER — Other Ambulatory Visit: Payer: Self-pay | Admitting: Family Medicine

## 2021-03-16 ENCOUNTER — Encounter: Payer: Self-pay | Admitting: Family Medicine

## 2021-03-16 DIAGNOSIS — D509 Iron deficiency anemia, unspecified: Secondary | ICD-10-CM

## 2021-03-16 NOTE — Telephone Encounter (Signed)
Called patient. Patient is requesting hemoglobin to be checked at lab visit on 03/19/2021.  Patient states that she is not having current [facility] or chest pain. Advised patient of red flags and ED precautions.   Forwarding to PCP  Talbot Grumbling, RN

## 2021-03-18 ENCOUNTER — Encounter (HOSPITAL_COMMUNITY): Payer: Self-pay

## 2021-03-18 ENCOUNTER — Emergency Department (HOSPITAL_COMMUNITY): Payer: Medicare Other

## 2021-03-18 ENCOUNTER — Other Ambulatory Visit: Payer: Self-pay

## 2021-03-18 ENCOUNTER — Emergency Department (HOSPITAL_COMMUNITY)
Admission: EM | Admit: 2021-03-18 | Discharge: 2021-03-18 | Disposition: A | Discharge status: Home / Self Care | Payer: Medicare Other | Attending: Emergency Medicine | Admitting: Emergency Medicine

## 2021-03-18 DIAGNOSIS — Z7901 Long term (current) use of anticoagulants: Secondary | ICD-10-CM | POA: Diagnosis not present

## 2021-03-18 DIAGNOSIS — R911 Solitary pulmonary nodule: Secondary | ICD-10-CM | POA: Diagnosis not present

## 2021-03-18 DIAGNOSIS — I10 Essential (primary) hypertension: Secondary | ICD-10-CM | POA: Diagnosis not present

## 2021-03-18 DIAGNOSIS — Z86711 Personal history of pulmonary embolism: Secondary | ICD-10-CM | POA: Diagnosis not present

## 2021-03-18 DIAGNOSIS — R079 Chest pain, unspecified: Secondary | ICD-10-CM | POA: Diagnosis not present

## 2021-03-18 DIAGNOSIS — R0602 Shortness of breath: Secondary | ICD-10-CM | POA: Insufficient documentation

## 2021-03-18 DIAGNOSIS — Z79899 Other long term (current) drug therapy: Secondary | ICD-10-CM | POA: Diagnosis not present

## 2021-03-18 DIAGNOSIS — R600 Localized edema: Secondary | ICD-10-CM | POA: Insufficient documentation

## 2021-03-18 DIAGNOSIS — Z9049 Acquired absence of other specified parts of digestive tract: Secondary | ICD-10-CM | POA: Diagnosis not present

## 2021-03-18 DIAGNOSIS — Z20822 Contact with and (suspected) exposure to covid-19: Secondary | ICD-10-CM | POA: Diagnosis not present

## 2021-03-18 LAB — CBG MONITORING, ED: Glucose-Capillary: 117 mg/dL — ABNORMAL HIGH (ref 70–99)

## 2021-03-18 LAB — CBC
HCT: 31.4 % — ABNORMAL LOW (ref 36.0–46.0)
Hemoglobin: 9.9 g/dL — ABNORMAL LOW (ref 12.0–15.0)
MCH: 27 pg (ref 26.0–34.0)
MCHC: 31.5 g/dL (ref 30.0–36.0)
MCV: 85.8 fL (ref 80.0–100.0)
Platelets: 286 10*3/uL (ref 150–400)
RBC: 3.66 MIL/uL — ABNORMAL LOW (ref 3.87–5.11)
RDW: 15.6 % — ABNORMAL HIGH (ref 11.5–15.5)
WBC: 5.2 10*3/uL (ref 4.0–10.5)
nRBC: 0 % (ref 0.0–0.2)

## 2021-03-18 LAB — RESP PANEL BY RT-PCR (FLU A&B, COVID) ARPGX2
Influenza A by PCR: NEGATIVE
Influenza B by PCR: NEGATIVE
SARS Coronavirus 2 by RT PCR: NEGATIVE

## 2021-03-18 LAB — BASIC METABOLIC PANEL
Anion gap: 5 (ref 5–15)
BUN: 9 mg/dL (ref 8–23)
CO2: 25 mmol/L (ref 22–32)
Calcium: 8.5 mg/dL — ABNORMAL LOW (ref 8.9–10.3)
Chloride: 108 mmol/L (ref 98–111)
Creatinine, Ser: 0.54 mg/dL (ref 0.44–1.00)
GFR, Estimated: >60 mL/min (ref >60)
Glucose, Bld: 106 mg/dL — ABNORMAL HIGH (ref 70–99)
Potassium: 3.9 mmol/L (ref 3.5–5.1)
Sodium: 138 mmol/L (ref 135–145)

## 2021-03-18 LAB — PROTIME-INR
INR: 1.8 — ABNORMAL HIGH (ref 0.8–1.2)
Prothrombin Time: 21.3 seconds — ABNORMAL HIGH (ref 11.4–15.2)

## 2021-03-18 LAB — TROPONIN I (HIGH SENSITIVITY)
Troponin I (High Sensitivity): 3 ng/L (ref <18)
Troponin I (High Sensitivity): 4 ng/L (ref <18)

## 2021-03-18 LAB — D-DIMER, QUANTITATIVE: D-Dimer, Quant: 2.69 ug/mL-FEU — ABNORMAL HIGH (ref 0.00–0.50)

## 2021-03-18 MED ORDER — IOHEXOL 350 MG/ML SOLN
100.0000 mL | Freq: Once | INTRAVENOUS | Status: AC | PRN
  Administered 2021-03-18: 100 mL via INTRAVENOUS

## 2021-03-18 NOTE — ED Triage Notes (Signed)
Pt reports she has been short of breath for the last few days. Has a hx of PE. Recently had a R knee replacement on 1/16. Reports onset of intermittent chest pain this am with inspiration 5/10 pain. Had a normal stress test on 1/7.

## 2021-03-18 NOTE — Discharge Instructions (Addendum)
Tomorrow, do your Coumadin check as normal.  Please discussed the findings today that your INR was 1.8 and they may need to make an adjustment to your dosing regimen.  Please also discuss your symptoms with your primary care doctor as well as with your cardiologist.  If you feel you are having worsening chest pain or difficulty breathing, come back to ER for reassessment.

## 2021-03-18 NOTE — ED Provider Notes (Signed)
Signout note  71 year old lady with extensive past medical history including prior history of PE on Coumadin presenting to ER with concern for mild chest discomfort and shortness of breath.  Reportedly had normal stress test on 1/7.  EKG without ischemic change, troponin within normal limits, D-dimer ordered and noted to be elevated.  At time of signout, CTA chest pending.  If negative, discharge patient.  CTA chest is negative for acute PE or other acute pathology.  Noted INR is slightly subtherapeutic today.  Discussed these findings with patient, she reports she has a appointment for Coumadin check tomorrow.  I discussed that she must review her dosing regimen with her regular provider tomorrow as she may need some adjustment in her Coumadin dosing.  Additionally advised follow-up with both primary care and cardiology regarding her symptoms today.  Reviewed precautions and discharged.   Jacqueline Starch, MD 03/18/21 [phone]

## 2021-03-18 NOTE — ED Provider Notes (Signed)
[facility] EMERGENCY DEPARTMENT Provider Note   CSN: [id number] Arrival date & time: 03/18/21  1111     History  Chief Complaint  Patient presents with   Chest Pain   Shortness of Breath    [name] is a 71 y.o. female.   Chest Pain Associated symptoms: shortness of breath   Shortness of Breath Associated symptoms: chest pain    HPI: A 71 year old patient with a history of hypertension presents for evaluation of chest pain. Initial onset of pain was approximately 1-3 hours ago. The patient's chest pain is well-localized and is not worse with exertion. The patient complains of nausea. The patient's chest pain is middle- or left-sided, is not described as heaviness/pressure/tightness, is not sharp and does not radiate to the arms/jaw/neck. The patient denies diaphoresis. The patient has no history of stroke, has no history of peripheral artery disease, has not smoked in the past 90 days, denies any history of treated diabetes, has no relevant family history of coronary artery disease (first degree relative at less than age 41), has no history of hypercholesterolemia and does not have an elevated BMI (>=30).  Patient has an outpatient stress test that was felt to be low risk.  She did have a PE in the past and is on anticoagulation.  Home Medications Prior to Admission medications   Medication Sig Start Date End Date Taking? Authorizing Provider  acetaminophen (TYLENOL) 325 MG tablet Take 975 mg by mouth every 6 (six) hours as needed for mild pain.   Yes [provider]  ALPRAZolam (XANAX) 0.5 MG tablet Take 1 tablet (0.5 mg total) by mouth 3 (three) times daily as needed for anxiety. Patient taking differently: Take 0.25-0.5 mg by mouth 3 (three) times daily as needed for anxiety. 02/15/21  Yes [name], MD  benazepril (LOTENSIN) 20 MG tablet TAKE 1 TABLET BY MOUTH  DAILY Patient taking differently: Take 20 mg by mouth daily. 12/11/20  Yes [name], MD  Chlorpheniramine-DM (CORICIDIN HBP COUGH/COLD PO) Take 1 tablet by mouth 2 (two) times daily as needed (cold/cough).   Yes [provider]  diclofenac (VOLTAREN) 75 MG EC tablet Take 75 mg by mouth 2 (two) times daily.   Yes [provider]  esomeprazole (NEXIUM) 20 MG capsule TAKE 1 CAPSULE(20 MG) BY MOUTH DAILY Patient taking differently: Take 20 mg by mouth daily. 02/12/17  Yes [name], MD  fluconazole (DIFLUCAN) 100 MG tablet TAKE 1 TABLET BY MOUTH ONCE WEEKLY Patient taking differently: Take 100 mg by mouth every Monday. 06/05/20  Yes [name], MD  furosemide (LASIX) 20 MG tablet TAKE 2 TABLETS BY MOUTH IN  THE MORNING . MAY TAKE 1  TABLET BY MOUTH IN THE  AFTERNOON AS ADDITIONAL  DOSE AS NEEDED Patient taking differently: Take 20-40 mg by mouth See admin instructions. 40 mg in the morning 20 mg in the afternoon as needed for fluid 06/05/20  Yes [name], MD  gabapentin (NEURONTIN) 100 MG capsule Take one or two tablets at night as directed Patient taking differently: Take 100-200 mg by mouth in the morning, at noon, and at bedtime. 100mg  in the morning, 100mg  in the afternoon, and 200mg  at bedtime 11/29/20  Yes [name], MD  HYDROcodone-acetaminophen (NORCO/VICODIN) 5-325 MG tablet Take 1-2 tablets by mouth every 6 (six) hours as needed for pain. 03/09/21  Yes [provider]  HYDROmorphone (DILAUDID) 2 MG tablet Take 1-2 tablets (2-4 mg  total) by mouth every 6 (six) hours as needed for severe pain or moderate pain. 03/06/21  Yes Edmisten, Kristie L, PA  methocarbamol (ROBAXIN) 500 MG tablet Take 1 tablet (500 mg total) by mouth every 6 (six) hours as needed for muscle spasms. 03/06/21  Yes Edmisten, Kristie L, PA  venlafaxine XR (EFFEXOR-XR) 150 MG 24 hr capsule TAKE 1 CAPSULE BY MOUTH  DAILY Patient taking differently: Take 150 mg by mouth at bedtime. Along with 75 mg capsule 12/11/20  Yes [name], MD  venlafaxine XR (EFFEXOR-XR) 75 MG  24 hr capsule TAKE 1 CAPSULE BY MOUTH  DAILY IN ADDITION TO 150 MG CAPSULE FOR 225 MG DAILY Patient taking differently: Take 75 mg by mouth daily with breakfast. Along with 150 mg capsule 12/11/20  Yes [name], MD  warfarin (COUMADIN) 2 MG tablet Take 3 tablets (6 mg total) by mouth daily. 01/02/21  Yes [name], MD  enoxaparin (LOVENOX) 40 MG/0.4ML injection Inject 0.4 mLs (40 mg total) into the skin daily for 4 days. May discontinue once PT-INR is therapeutic. Schedule appt to check this with Dr. Nori Riis on either Thursday or Friday. 03/07/21 03/11/21  Edmisten, Ok Anis, PA      Allergies    Augmentin [amoxicillin-pot clavulanate], Vioxx [rofecoxib], Bactrim [sulfamethoxazole-trimethoprim], Banana, Cephalosporins, Ciprofloxacin, Coconut flavor, Codeine, and Zithromax [azithromycin]    Review of Systems   Review of Systems  Respiratory:  Positive for shortness of breath.   Cardiovascular:  Positive for chest pain.  All other systems reviewed and are negative.  Physical Exam Updated Vital Signs BP 132/60    Pulse 77    Temp 98.5 F (36.9 C) (Oral)    Resp 14    Ht 1.486 m (4' 10.5")    Wt 78.5 kg    SpO2 98%    BMI 35.54 kg/m  Physical Exam Vitals and nursing note reviewed.  Constitutional:      General: She is not in acute distress.    Appearance: She is well-developed.  HENT:     Head: Normocephalic and atraumatic.     Right Ear: External ear normal.     Left Ear: External ear normal.  Eyes:     General: No scleral icterus.       Right eye: No discharge.        Left eye: No discharge.     Conjunctiva/sclera: Conjunctivae normal.  Neck:     Trachea: No tracheal deviation.  Cardiovascular:     Rate and Rhythm: Normal rate and regular rhythm.  Pulmonary:     Effort: Pulmonary effort is normal. No respiratory distress.     Breath sounds: Normal breath sounds. No stridor. No wheezing or rales.  Abdominal:     General: Bowel sounds are normal. There is no distension.      Palpations: Abdomen is soft.     Tenderness: There is no abdominal tenderness. There is no guarding or rebound.  Musculoskeletal:        General: No tenderness or deformity.     Cervical back: Neck supple.     Right lower leg: Edema present.  Skin:    General: Skin is warm and dry.     Findings: No rash.  Neurological:     General: No focal deficit present.     Mental Status: She is alert.     Cranial Nerves: No cranial nerve deficit (no facial droop, extraocular movements intact, no slurred speech).     Sensory:  No sensory deficit.     Motor: No abnormal muscle tone or seizure activity.     Coordination: Coordination normal.  Psychiatric:        Mood and Affect: Mood normal.    ED Results / Procedures / Treatments   Labs (all labs ordered are listed, but only abnormal results are displayed) Labs Reviewed  BASIC METABOLIC PANEL - Abnormal; Notable for the following components:      Result Value   Glucose, Bld 106 (*)    Calcium 8.5 (*)    All other components within normal limits  CBC - Abnormal; Notable for the following components:   RBC 3.66 (*)    Hemoglobin 9.9 (*)    HCT 31.4 (*)    RDW 15.6 (*)    All other components within normal limits  PROTIME-INR - Abnormal; Notable for the following components:   Prothrombin Time 21.3 (*)    INR 1.8 (*)    All other components within normal limits  D-DIMER, QUANTITATIVE - Abnormal; Notable for the following components:   D-Dimer, Quant 2.69 (*)    All other components within normal limits  CBG MONITORING, ED - Abnormal; Notable for the following components:   Glucose-Capillary 117 (*)    All other components within normal limits  RESP PANEL BY RT-PCR (FLU A&B, COVID) [id number]  TROPONIN I (HIGH SENSITIVITY)  TROPONIN I (HIGH SENSITIVITY)    EKG EKG Interpretation  Date/Time:  Sunday March 18 2021 11:24:22 EST Ventricular Rate:  79 PR Interval:  115 QRS Duration: 90 QT Interval:  365 QTC Calculation: 419 R  Axis:   55 Text Interpretation: Sinus rhythm Borderline short PR interval Abnormal R-wave progression, early transition No significant change since last tracing Confirmed by Dorie Rank [id number]) on 03/18/2021 11:43:39 AM  Radiology DG Chest Portable 1 View  Result Date: 03/18/2021 CLINICAL DATA:  71 year old female with history of shortness of breath. History of pulmonary embolism. EXAM: PORTABLE CHEST 1 VIEW COMPARISON:  Chest x-ray 04/19/2019. FINDINGS: Lung volumes are low. No consolidative airspace disease. No pleural effusions. No pneumothorax. No pulmonary nodule or mass noted. Pulmonary vasculature and the cardiomediastinal silhouette are within normal limits. Surgical clips project over the left upper quadrant of the abdomen. Status post right shoulder arthroplasty. IMPRESSION: 1. Low lung volumes without radiographic evidence of acute cardiopulmonary disease. Electronically Signed   By: Vinnie Langton M.D.   On: 03/18/2021 12:00    Procedures Procedures    Medications Ordered in ED Medications - No data to display  ED Course/ Medical Decision Making/ A&P Clinical Course as of 03/18/21 1440  Sun Mar 18, 2021  1304 Hemoglobin slightly decreased from 12 days ago when it was 10.3. [JK]  6546 Basic metabolic panel(!) Normal [JK]  1305 Troponin I (High Sensitivity) Initial troponin normal [JK]  1305 DG Chest Portable 1 View Chest x-ray images and results reviewed.  No acute findings [JK]  1312 Reviewed findings with pt.  Pt asked about getting a ct scan of the chest.  Reviewed her most recent visit in December.  CT angio done at that time, 12/24 no acute pe noted.  Will screen with d dimer [JK]  1439 Repeat troponin is normal at 4.  INR subtherapeutic at 1.8.  D-dimer elevated 2.69.  We will proceed with CT scan [JK]    Clinical Course User Index [JK] Dorie Rank, MD   [facility] Score: 4  Medical Decision Making Amount and/or Complexity of Data Reviewed Labs:  ordered. Decision-making details documented in ED Course. Radiology: ordered. Decision-making details documented in ED Course.  Risk Prescription drug management.  Chest pain No known history of coronary artery disease.  Previous records reviewed.  Patient did have a myocardial perfusion imaging test performed on January 10 of this year.  Results reviewed, report indicates patient is low risk felt low risk from a cardiovascular standpoint.  Serial troponins are normal doubt acute coronary syndrome.  Shortness of breath history of PE Patient did have a negative CT scan 1 month ago.  She is having recurrent symptoms however and does have an elevated D-dimer.  INR is somewhat subtherapeutic.  We will proceed with CT scan.  CT scan pending at shift change.  Dr Roslynn Amble to follow up on result.  Anticipate discharge if negative.        Final Clinical Impression(s) / ED Diagnoses Final diagnoses:  Chest pain, unspecified type  Shortness of breath    Rx / DC Orders ED Discharge Orders     None         Dorie Rank, MD 03/19/21 [phone]

## 2021-03-19 ENCOUNTER — Other Ambulatory Visit: Payer: Self-pay

## 2021-03-19 ENCOUNTER — Ambulatory Visit (INDEPENDENT_AMBULATORY_CARE_PROVIDER_SITE_OTHER): Payer: Medicare Other | Admitting: *Deleted

## 2021-03-19 DIAGNOSIS — Z7901 Long term (current) use of anticoagulants: Secondary | ICD-10-CM | POA: Diagnosis not present

## 2021-03-19 DIAGNOSIS — M25661 Stiffness of right knee, not elsewhere classified: Secondary | ICD-10-CM | POA: Diagnosis not present

## 2021-03-19 LAB — POCT INR: INR: 1.7 — AB (ref 2.0–3.0)

## 2021-03-21 DIAGNOSIS — M25561 Pain in right knee: Secondary | ICD-10-CM | POA: Diagnosis not present

## 2021-03-21 DIAGNOSIS — M25661 Stiffness of right knee, not elsewhere classified: Secondary | ICD-10-CM | POA: Diagnosis not present

## 2021-03-23 DIAGNOSIS — M25661 Stiffness of right knee, not elsewhere classified: Secondary | ICD-10-CM | POA: Diagnosis not present

## 2021-03-23 DIAGNOSIS — M25561 Pain in right knee: Secondary | ICD-10-CM | POA: Diagnosis not present

## 2021-03-26 ENCOUNTER — Ambulatory Visit (INDEPENDENT_AMBULATORY_CARE_PROVIDER_SITE_OTHER): Payer: Medicare Other | Admitting: *Deleted

## 2021-03-26 ENCOUNTER — Other Ambulatory Visit: Payer: Self-pay

## 2021-03-26 DIAGNOSIS — Z7901 Long term (current) use of anticoagulants: Secondary | ICD-10-CM

## 2021-03-26 DIAGNOSIS — M25661 Stiffness of right knee, not elsewhere classified: Secondary | ICD-10-CM | POA: Diagnosis not present

## 2021-03-26 DIAGNOSIS — M25561 Pain in right knee: Secondary | ICD-10-CM | POA: Diagnosis not present

## 2021-03-26 LAB — POCT INR: INR: 2.5 (ref 2.0–3.0)

## 2021-03-28 DIAGNOSIS — M25561 Pain in right knee: Secondary | ICD-10-CM | POA: Diagnosis not present

## 2021-03-28 DIAGNOSIS — M25661 Stiffness of right knee, not elsewhere classified: Secondary | ICD-10-CM | POA: Diagnosis not present

## 2021-03-30 DIAGNOSIS — M25661 Stiffness of right knee, not elsewhere classified: Secondary | ICD-10-CM | POA: Diagnosis not present

## 2021-04-02 ENCOUNTER — Ambulatory Visit: Payer: Medicare Other

## 2021-04-04 DIAGNOSIS — M25661 Stiffness of right knee, not elsewhere classified: Secondary | ICD-10-CM | POA: Diagnosis not present

## 2021-04-06 ENCOUNTER — Other Ambulatory Visit: Payer: Self-pay

## 2021-04-06 ENCOUNTER — Ambulatory Visit (INDEPENDENT_AMBULATORY_CARE_PROVIDER_SITE_OTHER): Payer: Medicare Other | Admitting: *Deleted

## 2021-04-06 DIAGNOSIS — M25661 Stiffness of right knee, not elsewhere classified: Secondary | ICD-10-CM | POA: Diagnosis not present

## 2021-04-06 DIAGNOSIS — Z7901 Long term (current) use of anticoagulants: Secondary | ICD-10-CM

## 2021-04-06 LAB — POCT INR: INR: 3.8 — AB (ref 2.0–3.0)

## 2021-04-11 DIAGNOSIS — Z4789 Encounter for other orthopedic aftercare: Secondary | ICD-10-CM | POA: Diagnosis not present

## 2021-04-13 ENCOUNTER — Ambulatory Visit (INDEPENDENT_AMBULATORY_CARE_PROVIDER_SITE_OTHER): Payer: Medicare Other | Admitting: *Deleted

## 2021-04-13 ENCOUNTER — Other Ambulatory Visit: Payer: Self-pay

## 2021-04-13 DIAGNOSIS — Z7901 Long term (current) use of anticoagulants: Secondary | ICD-10-CM | POA: Diagnosis not present

## 2021-04-13 LAB — POCT INR: INR: 3 (ref 2.0–3.0)

## 2021-04-20 ENCOUNTER — Ambulatory Visit (INDEPENDENT_AMBULATORY_CARE_PROVIDER_SITE_OTHER): Payer: Medicare Other | Admitting: *Deleted

## 2021-04-20 ENCOUNTER — Other Ambulatory Visit: Payer: Self-pay

## 2021-04-20 ENCOUNTER — Ambulatory Visit (INDEPENDENT_AMBULATORY_CARE_PROVIDER_SITE_OTHER): Payer: Medicare Other | Admitting: Family Medicine

## 2021-04-20 VITALS — BP 129/64 | Ht 58.5 in | Wt 170.0 lb

## 2021-04-20 DIAGNOSIS — Z7901 Long term (current) use of anticoagulants: Secondary | ICD-10-CM | POA: Diagnosis not present

## 2021-04-20 DIAGNOSIS — R269 Unspecified abnormalities of gait and mobility: Secondary | ICD-10-CM | POA: Diagnosis not present

## 2021-04-20 LAB — POCT INR: INR: 3.1 — AB (ref 2.0–3.0)

## 2021-04-20 NOTE — Assessment & Plan Note (Signed)
I think the major issue is left greater than right but bilateral weak hip abductors.  Home exercise program given.  I did alter her orthotics just a little bit placing a lateral post on both of them.  She will follow-up in 2 to 3 weeks, sooner with problems. ?

## 2021-04-20 NOTE — Progress Notes (Signed)
?  [name] - 71 y.o. female MRN [id number]  Date of birth: 11-27-1950 ? ? ? ?SUBJECTIVE:    ?  ?Chief Complaint:/ HPI:  ?Gait problems.  Since her knee replacement, she feels like her orthotics need some adjustment.  Feels like the lateral portion needs to be built up some.  This is causing some unsteadiness in her gait.  She is otherwise doing well from the knee replacement. ? ? ? ?OBJECTIVE: BP 129/64   Ht 4' 10.5" (1.486 m)   Wt 170 lb (77.1 kg)   BMI 34.93 kg/m?   ?Physical Exam:  Vital signs are reviewed. ?GENERAL: Well-developed female no acute distress ?GAIT: Left foot crossover across the midline on swing phase.  Slight crossover of the right but not nearly as much as the left.  She does have some overpronation. ?Hip abductors mildly weak. ? ?ASSESSMENT & PLAN: ? ?See problem based charting & AVS for pt instructions. ?Gait abnormality ?I think the major issue is left greater than right but bilateral weak hip abductors.  Home exercise program given.  I did alter her orthotics just a little bit placing a lateral post on both of them.  She will follow-up in 2 to 3 weeks, sooner with problems. ? ?

## 2021-04-20 NOTE — Patient Instructions (Addendum)
Remains a moderate distance to orthotics.  Did not tell to fix this issue as I think the main part of the problem you are having this issue is some weakness in the Abductor muscles.  I given you some exercises for the wrist to do at home.  I will try and at least once daily sometimes twice.  Patient started causing a lot of pain, call me, otherwise I will see  back in 4 to 6 weeks. Great to see you! ?

## 2021-04-27 ENCOUNTER — Other Ambulatory Visit: Payer: Self-pay

## 2021-04-27 ENCOUNTER — Ambulatory Visit (INDEPENDENT_AMBULATORY_CARE_PROVIDER_SITE_OTHER): Payer: Medicare Other | Admitting: *Deleted

## 2021-04-27 DIAGNOSIS — Z7901 Long term (current) use of anticoagulants: Secondary | ICD-10-CM | POA: Diagnosis not present

## 2021-04-27 LAB — POCT INR: INR: 2.7 (ref 2.0–3.0)

## 2021-05-11 ENCOUNTER — Ambulatory Visit (INDEPENDENT_AMBULATORY_CARE_PROVIDER_SITE_OTHER): Payer: Medicare Other | Admitting: *Deleted

## 2021-05-11 ENCOUNTER — Other Ambulatory Visit: Payer: Self-pay

## 2021-05-11 DIAGNOSIS — Z7901 Long term (current) use of anticoagulants: Secondary | ICD-10-CM | POA: Diagnosis not present

## 2021-05-11 LAB — POCT INR: INR: 3.3 — AB (ref 2.0–3.0)

## 2021-05-11 NOTE — Progress Notes (Signed)
Hold coumadin starting today for tooth extraction on Monday. Restart coumadin at new dose Monday evening. Follow up 05/21/2021 for follow up INR. Jaymes Graff Shyhiem Beeney ? ?

## 2021-05-14 ENCOUNTER — Telehealth: Payer: Self-pay | Admitting: *Deleted

## 2021-05-14 NOTE — Telephone Encounter (Signed)
Returned call to patient inquiring about her coumadin dosing. [name] INR was 3.3 on 3/24. Coumadin was held Fri, Sat, and Sun for tooth extraction today 3/27. She called in today to notify us that the extraction needed packing and she will be taking amoxicillin and ibuprofen. Discussed with Dr Thompson Grayer and we decided to start her coumadin back at a low dose. She was instructed to take 4 mg Today through Wed and come in Thurs for an INR check. Jaymes Graff [facility] ? ?

## 2021-05-17 ENCOUNTER — Ambulatory Visit (INDEPENDENT_AMBULATORY_CARE_PROVIDER_SITE_OTHER): Payer: Medicare Other | Admitting: *Deleted

## 2021-05-17 DIAGNOSIS — Z7901 Long term (current) use of anticoagulants: Secondary | ICD-10-CM

## 2021-05-17 LAB — POCT INR: INR: 1.1 — AB (ref 2.0–3.0)

## 2021-05-17 NOTE — Progress Notes (Signed)
Patient was on a reduced dose coming off a dental extraction plus taking antibiotics and ibuprofen. Resume normal dose prior to procedure. Advised patient to stop taking ibuprofen per Dr Owens Shark. Patient has left over hydrocodone from prior surgery she will take instead. [name] [name] ? ?

## 2021-05-21 ENCOUNTER — Ambulatory Visit (INDEPENDENT_AMBULATORY_CARE_PROVIDER_SITE_OTHER): Payer: Medicare Other | Admitting: *Deleted

## 2021-05-21 DIAGNOSIS — Z7901 Long term (current) use of anticoagulants: Secondary | ICD-10-CM | POA: Diagnosis not present

## 2021-05-21 LAB — POCT INR: INR: 1.4 — AB (ref 2.0–3.0)

## 2021-05-28 ENCOUNTER — Ambulatory Visit (INDEPENDENT_AMBULATORY_CARE_PROVIDER_SITE_OTHER): Payer: Medicare Other | Admitting: *Deleted

## 2021-05-28 DIAGNOSIS — Z7901 Long term (current) use of anticoagulants: Secondary | ICD-10-CM | POA: Diagnosis not present

## 2021-05-28 DIAGNOSIS — D509 Iron deficiency anemia, unspecified: Secondary | ICD-10-CM

## 2021-05-28 LAB — POCT INR: INR: 1.8 — AB (ref 2.0–3.0)

## 2021-05-28 NOTE — Progress Notes (Signed)
Patient has a follow up dental appointment for possible suture replacement. Remain on current dose and follow up in 1 week. Jaymes Graff Chrishawn Kring ? ?

## 2021-06-04 ENCOUNTER — Ambulatory Visit (INDEPENDENT_AMBULATORY_CARE_PROVIDER_SITE_OTHER): Payer: Medicare Other | Admitting: *Deleted

## 2021-06-04 DIAGNOSIS — Z7901 Long term (current) use of anticoagulants: Secondary | ICD-10-CM | POA: Diagnosis not present

## 2021-06-04 LAB — POCT INR: INR: 2.2 (ref 2.0–3.0)

## 2021-06-14 ENCOUNTER — Ambulatory Visit
Admission: RE | Admit: 2021-06-14 | Discharge: 2021-06-14 | Disposition: A | Discharge status: Home / Self Care | Payer: Medicare Other | Source: Ambulatory Visit | Attending: Sports Medicine | Admitting: Sports Medicine

## 2021-06-14 ENCOUNTER — Ambulatory Visit: Payer: Medicare Other | Admitting: Sports Medicine

## 2021-06-14 ENCOUNTER — Other Ambulatory Visit: Payer: Self-pay | Admitting: Sports Medicine

## 2021-06-14 ENCOUNTER — Other Ambulatory Visit: Payer: Self-pay | Admitting: *Deleted

## 2021-06-14 ENCOUNTER — Encounter: Payer: Self-pay | Admitting: Family Medicine

## 2021-06-14 VITALS — BP 119/52 | Ht 58.5 in | Wt 170.0 lb

## 2021-06-14 DIAGNOSIS — M25571 Pain in right ankle and joints of right foot: Secondary | ICD-10-CM

## 2021-06-14 DIAGNOSIS — S93491A Sprain of other ligament of right ankle, initial encounter: Secondary | ICD-10-CM | POA: Diagnosis not present

## 2021-06-14 DIAGNOSIS — M25561 Pain in right knee: Secondary | ICD-10-CM

## 2021-06-14 NOTE — Progress Notes (Signed)
? ?  Subjective:  ? ? Patient ID: [name], female    DOB: [birth year], 71 y.o.   MRN: [id number] ? ?HPI chief complaint: Right ankle pain ? ?Patient is a very pleasant 71 year old female that comes in today after having suffered an inversion injury to her right ankle approximately 1 week ago.  She did develop pain and swelling laterally at the time.  Since then, her bruising has improved but swelling has persisted.  All of her pain is along the lateral ankle.  She denies pain in the medial ankle.  No pain in her foot.  No significant injuries to this foot in the past.  She also tweaked the right knee but it is nonpainful.  She is status post total knee arthroplasty which was done in January of this year.  She has done very well postoperatively. ? ?Past medical history reviewed ?Medications reviewed ?Allergies reviewed ? ? ? ?Review of Systems ?As above ?   ?Objective:  ? Physical Exam ? ?Well-developed, well-nourished. ? ?Right knee: Nearly full range of motion.  No effusion.  Well-healed surgical incision.  No tenderness to palpation.  Good joint stability. ? ?Right ankle: Good range of motion.  There is some moderate soft tissue swelling over the lateral malleolus.  She is tender to palpation in the area of the ATF.  Positive anterior drawer, negative talar tilt.  No tenderness over the medial malleolus.  No tenderness at the base of the fifth metatarsal nor over the navicular.  Good pulses.  Ambulating with a slight limp. ? ?X-rays of the right knee including AP and lateral views show a well seated total knee arthroplasty without periprosthetic fracture. ?X-rays of the right ankle including AP, lateral, and mortise views show some soft tissue swelling laterally but no obvious fracture. ? ? ?   ?Assessment & Plan:  ? ?Right ankle pain secondary to lateral ankle sprain ?17-monthstatus post right knee total knee arthroplasty ? ?Med spec brace for the right ankle to be worn when active.  She takes Voltaren twice  daily already so she will obviously continue with this.  She will start a home rehab program consisting of range of motion exercises and strengthening when tolerated.  She may increase activity as tolerated and will follow-up with me for ongoing or recalcitrant issues. ? ?This note was dictated using Dragon naturally speaking software and may contain errors in syntax, spelling, or content which have not been identified prior to signing this note.  ?

## 2021-06-18 ENCOUNTER — Ambulatory Visit (INDEPENDENT_AMBULATORY_CARE_PROVIDER_SITE_OTHER): Payer: Medicare Other

## 2021-06-18 DIAGNOSIS — Z7901 Long term (current) use of anticoagulants: Secondary | ICD-10-CM | POA: Diagnosis not present

## 2021-06-18 LAB — POCT INR: INR: 1.6 — AB (ref 2.0–3.0)

## 2021-06-25 ENCOUNTER — Other Ambulatory Visit (INDEPENDENT_AMBULATORY_CARE_PROVIDER_SITE_OTHER): Payer: Medicare Other | Admitting: *Deleted

## 2021-06-25 DIAGNOSIS — Z7901 Long term (current) use of anticoagulants: Secondary | ICD-10-CM | POA: Diagnosis not present

## 2021-06-25 LAB — POCT INR: INR: 1.3 — AB (ref 2.0–3.0)

## 2021-07-02 ENCOUNTER — Ambulatory Visit (INDEPENDENT_AMBULATORY_CARE_PROVIDER_SITE_OTHER): Payer: Medicare Other | Admitting: *Deleted

## 2021-07-02 DIAGNOSIS — Z7901 Long term (current) use of anticoagulants: Secondary | ICD-10-CM | POA: Diagnosis not present

## 2021-07-02 LAB — POCT INR: INR: 1.8 — AB (ref 2.0–3.0)

## 2021-07-09 ENCOUNTER — Ambulatory Visit (INDEPENDENT_AMBULATORY_CARE_PROVIDER_SITE_OTHER): Payer: Medicare Other | Admitting: *Deleted

## 2021-07-09 DIAGNOSIS — Z7901 Long term (current) use of anticoagulants: Secondary | ICD-10-CM

## 2021-07-09 LAB — POCT INR: INR: 2.2 (ref 2.0–3.0)

## 2021-07-13 DIAGNOSIS — Z96651 Presence of right artificial knee joint: Secondary | ICD-10-CM | POA: Diagnosis not present

## 2021-07-23 ENCOUNTER — Ambulatory Visit (INDEPENDENT_AMBULATORY_CARE_PROVIDER_SITE_OTHER): Payer: Medicare Other | Admitting: *Deleted

## 2021-07-23 DIAGNOSIS — Z7901 Long term (current) use of anticoagulants: Secondary | ICD-10-CM | POA: Diagnosis not present

## 2021-07-23 LAB — POCT INR: INR: 1.7 — AB (ref 2.0–3.0)

## 2021-07-24 ENCOUNTER — Encounter: Payer: Self-pay | Admitting: *Deleted

## 2021-07-30 ENCOUNTER — Ambulatory Visit (INDEPENDENT_AMBULATORY_CARE_PROVIDER_SITE_OTHER): Payer: Medicare Other | Admitting: *Deleted

## 2021-07-30 DIAGNOSIS — Z7901 Long term (current) use of anticoagulants: Secondary | ICD-10-CM | POA: Diagnosis not present

## 2021-07-30 LAB — POCT INR: INR: 1.6 — AB (ref 2.0–3.0)

## 2021-08-02 ENCOUNTER — Encounter: Payer: Self-pay | Admitting: Family Medicine

## 2021-08-02 DIAGNOSIS — E538 Deficiency of other specified B group vitamins: Secondary | ICD-10-CM

## 2021-08-02 DIAGNOSIS — R5383 Other fatigue: Secondary | ICD-10-CM

## 2021-08-06 ENCOUNTER — Other Ambulatory Visit (INDEPENDENT_AMBULATORY_CARE_PROVIDER_SITE_OTHER): Payer: Medicare Other

## 2021-08-06 ENCOUNTER — Other Ambulatory Visit: Payer: Self-pay

## 2021-08-06 DIAGNOSIS — Z7901 Long term (current) use of anticoagulants: Secondary | ICD-10-CM

## 2021-08-06 DIAGNOSIS — E538 Deficiency of other specified B group vitamins: Secondary | ICD-10-CM | POA: Diagnosis not present

## 2021-08-06 DIAGNOSIS — R5383 Other fatigue: Secondary | ICD-10-CM | POA: Diagnosis not present

## 2021-08-06 LAB — POCT INR: INR: 1.8 — AB (ref 2.0–3.0)

## 2021-08-07 ENCOUNTER — Encounter: Payer: Self-pay | Admitting: Family Medicine

## 2021-08-07 LAB — CBC
Hematocrit: 37.8 % (ref 34.0–46.6)
Hemoglobin: 12.3 g/dL (ref 11.1–15.9)
MCH: 26.6 pg (ref 26.6–33.0)
MCHC: 32.5 g/dL (ref 31.5–35.7)
MCV: 82 fL (ref 79–97)
Platelets: 256 10*3/uL (ref 150–450)
RBC: 4.62 x10E6/uL (ref 3.77–5.28)
RDW: 13.5 % (ref 11.7–15.4)
WBC: 6.4 10*3/uL (ref 3.4–10.8)

## 2021-08-07 LAB — VITAMIN B12: Vitamin B-12: 188 pg/mL — ABNORMAL LOW (ref 232–1245)

## 2021-08-07 LAB — IRON: Iron: 44 ug/dL (ref 27–139)

## 2021-08-10 ENCOUNTER — Ambulatory Visit (INDEPENDENT_AMBULATORY_CARE_PROVIDER_SITE_OTHER): Payer: Medicare Other | Admitting: Family Medicine

## 2021-08-10 ENCOUNTER — Encounter: Payer: Self-pay | Admitting: Family Medicine

## 2021-08-10 ENCOUNTER — Telehealth: Payer: Self-pay | Admitting: *Deleted

## 2021-08-10 VITALS — BP 135/68 | HR 84 | Ht 58.5 in | Wt 172.0 lb

## 2021-08-10 DIAGNOSIS — E538 Deficiency of other specified B group vitamins: Secondary | ICD-10-CM

## 2021-08-10 DIAGNOSIS — H109 Unspecified conjunctivitis: Secondary | ICD-10-CM | POA: Diagnosis not present

## 2021-08-10 MED ORDER — OFLOXACIN 0.3 % OP SOLN: 2.0000 [drp] | Freq: Four times a day (QID) | OPHTHALMIC | 0 refills | Status: DC

## 2021-08-10 MED ORDER — CYANOCOBALAMIN 1000 MCG/ML IJ SOLN
1000.0000 ug | Freq: Once | INTRAMUSCULAR | Status: AC
  Administered 2021-08-10: 1000 ug via INTRAMUSCULAR

## 2021-08-10 NOTE — Progress Notes (Signed)
    SUBJECTIVE:   CHIEF COMPLAINT / HPI:   Conjunctivitis  The current episode started yesterday. Onset quality: She woke up with irritated left eye with redness which worsened over the next few hour. The problem has been gradually worsening (Started on the left eye, now moving to the right). The problem is moderate. Relieved by: OTC antiallergy eye drops. Nothing aggravates the symptoms. Associated symptoms include eye itching, eye discharge, eye pain and eye redness. Pertinent negatives include no fever, no photophobia, no ear discharge and no ear pain. Associated symptoms comments: She had tooth pull in March which opened up a cavity through her sinus to her right nostril. Later she had left ear pain which resolved a few days ago. No ear drainage. She had sore throat 4 days ago which has since resolved . There were no sick contacts.  Her vision is a little blurry with the watery eyes. Occasional cough.   PERTINENT  PMH / PSH: PMHx reviewed  OBJECTIVE:   BP 135/68   Pulse 84   Ht 4' 10.5" (1.486 m)   Wt 172 lb (78 kg)   SpO2 98%   BMI 35.34 kg/m   Physical Exam Vitals and nursing note reviewed.  Constitutional:      General: She is not in acute distress. HENT:     Head: Normocephalic.     Right Ear: Tympanic membrane and ear canal normal.     Left Ear: Tympanic membrane and ear canal normal.     Mouth/Throat:     Pharynx: Oropharynx is clear. Uvula midline. No oropharyngeal exudate.  Eyes:     General: Lids are normal.     Extraocular Movements:     Right eye: Normal extraocular motion.     Left eye: Normal extraocular motion.     Conjunctiva/sclera:     Right eye: Right conjunctiva is injected.     Left eye: Left conjunctiva is injected.     Comments: Neg pain or tenderness with eye movement during ROM exam. Left conjunctiva injection with very mild injection f her right conjunctiva. Visual acuity with her glasses on was normal  Cardiovascular:     Rate and Rhythm:  Normal rate and regular rhythm.     Heart sounds: Normal heart sounds. No murmur heard. Pulmonary:     Effort: Pulmonary effort is normal. No respiratory distress.     Breath sounds: Normal breath sounds.    Vision Screening   Right eye Left eye Both eyes  Without correction     With correction 20/20 20/20 20/20      ASSESSMENT/PLAN:   Acute conjunctivitis Likely bacterial conjunctivitis due to appearance Start topical flouroquinolone. She denies allergies to topical antibiotics. I advised her to contact her ophthalmologist or go to the ED if her symptoms worsens despite treatment or she develops vision changes. She agreed with the plan.  Seen by nursing for Vit B12 injection per PCP's request.  Janit Pagan, MD [facility]

## 2021-08-13 ENCOUNTER — Other Ambulatory Visit: Payer: Medicare Other

## 2021-08-14 ENCOUNTER — Other Ambulatory Visit (HOSPITAL_COMMUNITY): Payer: Self-pay

## 2021-08-14 ENCOUNTER — Other Ambulatory Visit (INDEPENDENT_AMBULATORY_CARE_PROVIDER_SITE_OTHER): Payer: Medicare Other

## 2021-08-14 DIAGNOSIS — Z7901 Long term (current) use of anticoagulants: Secondary | ICD-10-CM | POA: Diagnosis not present

## 2021-08-14 LAB — POCT INR: INR: 2.4 (ref 2.0–3.0)

## 2021-08-20 ENCOUNTER — Other Ambulatory Visit: Payer: Self-pay | Admitting: Family Medicine

## 2021-08-20 DIAGNOSIS — Z1231 Encounter for screening mammogram for malignant neoplasm of breast: Secondary | ICD-10-CM

## 2021-08-20 DIAGNOSIS — G629 Polyneuropathy, unspecified: Secondary | ICD-10-CM

## 2021-08-23 ENCOUNTER — Other Ambulatory Visit: Payer: Medicare Other

## 2021-08-23 DIAGNOSIS — Z7901 Long term (current) use of anticoagulants: Secondary | ICD-10-CM | POA: Diagnosis not present

## 2021-08-23 NOTE — Progress Notes (Signed)
Out of supplies for POCT INR.  Spoke with Dr. Thompson Grayer, will send out INR and call patient with next steps. Christen Bame, CMA

## 2021-08-24 ENCOUNTER — Telehealth: Payer: Self-pay | Admitting: Family Medicine

## 2021-08-24 LAB — PROTIME-INR
INR: 2.1 — ABNORMAL HIGH (ref 0.9–1.2)
Prothrombin Time: 21.4 s — ABNORMAL HIGH (ref 9.1–12.0)

## 2021-08-24 NOTE — Telephone Encounter (Signed)
Discussed INR 2.1, in range. Continue 7 mg daily. Next INR check in 2 weeks, scheduled for 09/06/21 at 10:30 AM.

## 2021-09-06 ENCOUNTER — Other Ambulatory Visit (INDEPENDENT_AMBULATORY_CARE_PROVIDER_SITE_OTHER): Payer: Medicare Other

## 2021-09-06 DIAGNOSIS — Z7901 Long term (current) use of anticoagulants: Secondary | ICD-10-CM | POA: Diagnosis not present

## 2021-09-06 LAB — POCT INR: INR: 2.9 (ref 2.0–3.0)

## 2021-09-11 ENCOUNTER — Other Ambulatory Visit: Payer: Self-pay | Admitting: Family Medicine

## 2021-09-11 DIAGNOSIS — G629 Polyneuropathy, unspecified: Secondary | ICD-10-CM

## 2021-09-19 ENCOUNTER — Other Ambulatory Visit (INDEPENDENT_AMBULATORY_CARE_PROVIDER_SITE_OTHER): Payer: Medicare Other

## 2021-09-19 DIAGNOSIS — Z7901 Long term (current) use of anticoagulants: Secondary | ICD-10-CM | POA: Diagnosis not present

## 2021-09-19 LAB — POCT INR: INR: 3 (ref 2.0–3.0)

## 2021-09-19 NOTE — Progress Notes (Signed)
Patient reports bleeding that lasted ~4 hrs after dental procedure. Also has a large bruise on her left arm, visible after two weeks. She reports taking a slighter lower dose of coumadin the day of her dental procedure on Monday 09/17/2021. Weekly dose reduced per Dr Nori Riis, will repeat INR in 1 week. [name] [name]

## 2021-09-26 ENCOUNTER — Encounter: Payer: Self-pay | Admitting: Family Medicine

## 2021-09-26 ENCOUNTER — Other Ambulatory Visit: Payer: Medicare Other

## 2021-09-26 DIAGNOSIS — Z7901 Long term (current) use of anticoagulants: Secondary | ICD-10-CM

## 2021-09-26 LAB — PROTIME-INR
INR: 7.5 (ref 0.9–1.2)
Prothrombin Time: 67.9 s — ABNORMAL HIGH (ref 9.1–12.0)

## 2021-09-26 NOTE — Progress Notes (Signed)
Came in for INR check today and point-of-care testing revealed it to be elevated.  Stat INR sent out and was returned 1 hour later at 7.5, critical level.  She had reported no bleeding.  Feeling okay.  Has been on antibiotic, acetaminophen and some over-the-counter NSAIDs for some tooth pain.  She is to discontinue all of her NSAIDs and stop her Coumadin for now.  Follow-up Friday with a repeat INR.  Discussed red flags of bleeding to gums, bleeding in the urine etc.  As she is not having any obvious bleeding, I would hope we can get her back down to a therapeutic range without having to go the route of giving her vitamin K.  She understands the risks and what to watch for.  Follow-up Friday.

## 2021-09-26 NOTE — Progress Notes (Signed)
Patient's INR is critical high at 7.5. Patient was instructed to hold coumadin and return for a repeat INR on Friday. Advised her to go to the ER for any bruising/bleeding. Dr Nori Riis aware of result. Jaymes Graff Alaia Lordi

## 2021-09-27 ENCOUNTER — Telehealth: Payer: Self-pay

## 2021-09-27 ENCOUNTER — Ambulatory Visit
Admission: RE | Admit: 2021-09-27 | Discharge: 2021-09-27 | Disposition: A | Discharge status: Home / Self Care | Payer: Medicare Other | Source: Ambulatory Visit | Attending: Family Medicine | Admitting: Family Medicine

## 2021-09-27 DIAGNOSIS — Z1231 Encounter for screening mammogram for malignant neoplasm of breast: Secondary | ICD-10-CM

## 2021-09-27 NOTE — Telephone Encounter (Signed)
Pt informed of below.[name], CMA ? ?

## 2021-09-27 NOTE — Telephone Encounter (Signed)
Patient calls nurse line regarding questions with mammogram. Patient is supposed to have mammogram this afternoon at 2:00. She is asking if she should reschedule this appointment due to elevated INR yesterday.   Please advise.   Talbot Grumbling, RN

## 2021-09-27 NOTE — Telephone Encounter (Signed)
I tried calling and no voice mail. I think it is OK to go ahead and have the mammogram but if she is uncomfortable with the idea it is also fine to reschedule it. THANKS! Dorcas Mcmurray

## 2021-09-28 ENCOUNTER — Other Ambulatory Visit (INDEPENDENT_AMBULATORY_CARE_PROVIDER_SITE_OTHER): Payer: Medicare Other

## 2021-09-28 DIAGNOSIS — Z7901 Long term (current) use of anticoagulants: Secondary | ICD-10-CM | POA: Diagnosis not present

## 2021-09-28 LAB — POCT INR: INR: 2.2 (ref 2.0–3.0)

## 2021-10-03 ENCOUNTER — Other Ambulatory Visit (INDEPENDENT_AMBULATORY_CARE_PROVIDER_SITE_OTHER): Payer: Medicare Other

## 2021-10-03 DIAGNOSIS — Z7901 Long term (current) use of anticoagulants: Secondary | ICD-10-CM

## 2021-10-03 LAB — POCT INR: INR: 5.3 — AB (ref 2.0–3.0)

## 2021-10-10 ENCOUNTER — Other Ambulatory Visit (INDEPENDENT_AMBULATORY_CARE_PROVIDER_SITE_OTHER): Payer: Medicare Other

## 2021-10-10 DIAGNOSIS — Z7901 Long term (current) use of anticoagulants: Secondary | ICD-10-CM | POA: Diagnosis not present

## 2021-10-10 LAB — POCT INR: INR: 4 — AB (ref 2.0–3.0)

## 2021-10-12 ENCOUNTER — Other Ambulatory Visit: Payer: Self-pay | Admitting: Family Medicine

## 2021-10-12 DIAGNOSIS — G629 Polyneuropathy, unspecified: Secondary | ICD-10-CM

## 2021-10-12 MED ORDER — GABAPENTIN 100 MG PO CAPS: ORAL_CAPSULE | ORAL | 3 refills | Status: DC

## 2021-10-19 ENCOUNTER — Other Ambulatory Visit (INDEPENDENT_AMBULATORY_CARE_PROVIDER_SITE_OTHER): Payer: Medicare Other

## 2021-10-19 DIAGNOSIS — Z7901 Long term (current) use of anticoagulants: Secondary | ICD-10-CM | POA: Diagnosis not present

## 2021-10-19 LAB — POCT INR: INR: 2.8 (ref 2.0–3.0)

## 2021-10-26 ENCOUNTER — Other Ambulatory Visit (INDEPENDENT_AMBULATORY_CARE_PROVIDER_SITE_OTHER): Payer: Medicare Other

## 2021-10-26 DIAGNOSIS — Z7901 Long term (current) use of anticoagulants: Secondary | ICD-10-CM | POA: Diagnosis not present

## 2021-10-26 LAB — POCT INR: INR: 3.6 — AB (ref 2.0–3.0)

## 2021-11-02 ENCOUNTER — Other Ambulatory Visit (INDEPENDENT_AMBULATORY_CARE_PROVIDER_SITE_OTHER): Payer: Medicare Other

## 2021-11-02 ENCOUNTER — Encounter: Payer: Self-pay | Admitting: Family Medicine

## 2021-11-02 DIAGNOSIS — Z7901 Long term (current) use of anticoagulants: Secondary | ICD-10-CM

## 2021-11-02 LAB — POCT INR: INR: 4.4 — AB (ref 2.0–3.0)

## 2021-11-02 NOTE — Progress Notes (Signed)
Asked patient to scheduled follow up with Dr Nori Riis to discuss continued elevated INR results. [name]

## 2021-11-08 ENCOUNTER — Other Ambulatory Visit (INDEPENDENT_AMBULATORY_CARE_PROVIDER_SITE_OTHER): Payer: Medicare Other

## 2021-11-08 DIAGNOSIS — Z7901 Long term (current) use of anticoagulants: Secondary | ICD-10-CM

## 2021-11-08 LAB — POCT INR: INR: 2.4 (ref 2.0–3.0)

## 2021-11-11 ENCOUNTER — Other Ambulatory Visit: Payer: Self-pay | Admitting: Family Medicine

## 2021-11-15 ENCOUNTER — Other Ambulatory Visit (INDEPENDENT_AMBULATORY_CARE_PROVIDER_SITE_OTHER): Payer: Medicare Other

## 2021-11-15 DIAGNOSIS — Z7901 Long term (current) use of anticoagulants: Secondary | ICD-10-CM

## 2021-11-15 LAB — POCT INR: INR: 1.6 — AB (ref 2.0–3.0)

## 2021-11-22 ENCOUNTER — Other Ambulatory Visit (INDEPENDENT_AMBULATORY_CARE_PROVIDER_SITE_OTHER): Payer: Medicare Other

## 2021-11-22 DIAGNOSIS — Z7901 Long term (current) use of anticoagulants: Secondary | ICD-10-CM | POA: Diagnosis not present

## 2021-11-22 LAB — POCT INR: INR: 1.8 — AB (ref 2.0–3.0)

## 2021-11-28 ENCOUNTER — Ambulatory Visit: Payer: Medicare Other | Admitting: Family Medicine

## 2021-12-05 ENCOUNTER — Ambulatory Visit (INDEPENDENT_AMBULATORY_CARE_PROVIDER_SITE_OTHER): Payer: Medicare Other | Admitting: Family Medicine

## 2021-12-05 ENCOUNTER — Encounter: Payer: Self-pay | Admitting: Family Medicine

## 2021-12-05 VITALS — BP 102/52 | HR 71 | Temp 97.7°F | Ht 58.5 in | Wt 170.8 lb

## 2021-12-05 DIAGNOSIS — Z7901 Long term (current) use of anticoagulants: Secondary | ICD-10-CM | POA: Diagnosis not present

## 2021-12-05 DIAGNOSIS — Z23 Encounter for immunization: Secondary | ICD-10-CM

## 2021-12-05 DIAGNOSIS — K52831 Collagenous colitis: Secondary | ICD-10-CM

## 2021-12-05 DIAGNOSIS — R5383 Other fatigue: Secondary | ICD-10-CM | POA: Diagnosis not present

## 2021-12-05 DIAGNOSIS — R112 Nausea with vomiting, unspecified: Secondary | ICD-10-CM

## 2021-12-05 LAB — POCT INR: INR: 2 (ref 2.0–3.0)

## 2021-12-05 MED ORDER — BENAZEPRIL HCL 20 MG PO TABS: 20.0000 mg | ORAL_TABLET | Freq: Every day | ORAL | 3 refills | Status: DC

## 2021-12-05 MED ORDER — VENLAFAXINE HCL ER 75 MG PO CP24: ORAL_CAPSULE | ORAL | 3 refills | Status: DC

## 2021-12-05 MED ORDER — VENLAFAXINE HCL ER 150 MG PO CP24: 150.0000 mg | ORAL_CAPSULE | Freq: Every day | ORAL | 3 refills | Status: DC

## 2021-12-05 NOTE — Assessment & Plan Note (Signed)
Historically she has had difficult to control INR.  Will recheck in 10 days and continue current dosing.

## 2021-12-05 NOTE — Assessment & Plan Note (Addendum)
Referral placed for GI.  See comments under collagenous colitis.  Concerning that she has had a prior stomach stapling now is having some vomiting that is sometimes self-induced after eating because she has such bloating.  Also wonder if this is contributing to some vitamin deficiency given her nail changes.  We will check some labs.

## 2021-12-05 NOTE — Progress Notes (Signed)
    CHIEF COMPLAINT / HPI: #1.  Voice has gotten more hoarse in the last few months.  He particularly notices it toward the evening.  No sore throat.  Does have some postnasal drainage. 2.  Chronic nausea with intermittent vomiting.  Some of the vomiting seems self-induced because she becomes so uncomfortable with bloating after meal.  Notices that it sometimes she eats a larger volume and this really increases her symptoms. 3.  Still has intermittent chest tightness throughout the day that is unchanged. 4.  Has started craving salt recently. 5.  Has several teeth that need pulled but she wants to wait till after the first the year so she can better afford the dental care.  She wonders if this is contributing to her nausea. 6.  Has noticed nail changes.  They seem to have lines in her nails that she never noticed before and these seem to chip off more easily than previously.  This is only the fingernails.   PERTINENT  PMH / PSH: I have reviewed the patient's medications, allergies, past medical and surgical history, smoking status and updated in the EMR as appropriate. Collagenous colitis, chronic anticoagulation  OBJECTIVE:  BP (!) 102/52   Pulse 71   Temp 97.7 F (36.5 C) (Oral)   Ht 4' 10.5" (1.486 m)   Wt 170 lb 12.8 oz (77.5 kg)   SpO2 100%   BMI 35.09 kg/m  GENERAL: Well-developed female no acute distress H EENT: Oropharynx Buccal Mucosa Is Normal in Appearance.  No Tongue Lesions.  No Gum Abscess That I Can Ascertain.  Neck Is without Lymphadenopathy.  Thyroid Is Normal. CV: Regular Rate and Rhythm SKIN: Slightly Dry. LUNGS: Clear to Auscultation Bilaterally  ASSESSMENT / PLAN: Nail changes: With nausea and vomiting and history of collagenous colitis, wonder if this is related to nutritional deficiency.  Follow-up labs.  We will have her start multivitamin twice daily for the next couple of months.  Chronic anticoagulation Historically she has had difficult to control INR.   Will recheck in 10 days and continue current dosing.  Collagenous colitis With her history of collagenous colitis, I wonder if her nausea and vomiting is related.  I will send her back to GI for further evaluation.  Referral has been placed.  Nausea and vomiting Referral placed for GI.  See comments under collagenous colitis.  Concerning that she has had a prior stomach stapling now is having some vomiting that is sometimes self-induced after eating because she has such bloating.  Also wonder if this is contributing to some vitamin deficiency given her nail changes.  We will check some labs.  Other fatigue Check iron panel given history of anemia and chronic anticoagulation.   Dorcas Mcmurray MD

## 2021-12-05 NOTE — Assessment & Plan Note (Signed)
With her history of collagenous colitis, I wonder if her nausea and vomiting is related.  I will send her back to GI for further evaluation.  Referral has been placed.

## 2021-12-05 NOTE — Assessment & Plan Note (Signed)
Check iron panel given history of anemia and chronic anticoagulation.

## 2021-12-05 NOTE — Patient Instructions (Signed)
I will send you a note about your labs

## 2021-12-06 ENCOUNTER — Telehealth: Payer: Self-pay

## 2021-12-06 LAB — TSH: TSH: 3.56 u[IU]/mL (ref 0.450–4.500)

## 2021-12-06 LAB — CBC
Hematocrit: 38.2 % (ref 34.0–46.6)
Hemoglobin: 12.1 g/dL (ref 11.1–15.9)
MCH: 25.8 pg — ABNORMAL LOW (ref 26.6–33.0)
MCHC: 31.7 g/dL (ref 31.5–35.7)
MCV: 81 fL (ref 79–97)
Platelets: 274 10*3/uL (ref 150–450)
RBC: 4.69 x10E6/uL (ref 3.77–5.28)
RDW: 13.2 % (ref 11.7–15.4)
WBC: 4.8 10*3/uL (ref 3.4–10.8)

## 2021-12-06 LAB — IRON,TIBC AND FERRITIN PANEL
Ferritin: 19 ng/mL (ref 15–150)
Iron Saturation: 11 % — ABNORMAL LOW (ref 15–55)
Iron: 33 ug/dL (ref 27–139)
Total Iron Binding Capacity: 313 ug/dL (ref 250–450)
UIBC: 280 ug/dL (ref 118–369)

## 2021-12-06 LAB — COMPREHENSIVE METABOLIC PANEL
ALT: 22 IU/L (ref 0–32)
AST: 38 IU/L (ref 0–40)
Albumin/Globulin Ratio: 1.2 (ref 1.2–2.2)
Albumin: 2.7 g/dL — ABNORMAL LOW (ref 3.9–4.9)
Alkaline Phosphatase: 136 IU/L — ABNORMAL HIGH (ref 44–121)
BUN/Creatinine Ratio: 25 (ref 12–28)
BUN: 14 mg/dL (ref 8–27)
Bilirubin Total: <0.2 mg/dL (ref 0.0–1.2)
CO2: 24 mmol/L (ref 20–29)
Calcium: 8.2 mg/dL — ABNORMAL LOW (ref 8.7–10.3)
Chloride: 100 mmol/L (ref 96–106)
Creatinine, Ser: 0.56 mg/dL — ABNORMAL LOW (ref 0.57–1.00)
Globulin, Total: 2.2 g/dL (ref 1.5–4.5)
Glucose: 75 mg/dL (ref 70–99)
Potassium: 4.6 mmol/L (ref 3.5–5.2)
Sodium: 136 mmol/L (ref 134–144)
Total Protein: 4.9 g/dL — ABNORMAL LOW (ref 6.0–8.5)
eGFR: 99 mL/min/{1.73_m2} (ref >59)

## 2021-12-06 LAB — CORTISOL: Cortisol: 8.4 ug/dL (ref 6.2–19.4)

## 2021-12-06 LAB — VITAMIN B12: Vitamin B-12: 300 pg/mL (ref 232–1245)

## 2021-12-06 NOTE — Patient Instructions (Signed)
Visit Information  Thank you for taking time to visit with me today. Please don't hesitate to contact me if I can be of assistance to you.   Following are the goals we discussed today:   Goals Addressed             This Visit's Progress    COMPLETED: Care Coordination Activities- no follow up required        Care Coordination Interventions: Active listening / Reflection utilized  Emotional Support Provided Problem [place] strategies reviewed Discussed/.Educated Care Coordination Program 2.   Discussed/.Educated Annual Wellness Visit 3.   Discussed/.Educated Social Determinates of Health 4.   Please inform PCP if services needed in the future        If you are experiencing a Mental Health or [place] or need someone to talk to, please call 1-800-273-TALK (toll free, 24 hour hotline)   Patient verbalizes understanding of instructions and care plan provided today and agrees to view in [place]. Active MyChart status and patient understanding of how to access instructions and care plan via MyChart confirmed with patient.      Lazaro Arms RN, BSN, [facility]  Phone: [phone]

## 2021-12-06 NOTE — Patient Outreach (Signed)
  Care Coordination   Initial Visit Note   12/06/2021 Name: [name] MRN: [id number] DOB: 02/23/1950  [name] is a 71 y.o. year old female who sees [name], MD for primary care. I spoke with  Butch Penny by phone today.  What matters to the patients health and wellness today?  The patient has no cares or concerns with  her health.  She will follow up with her physician if she has any problems.   Goals Addressed             This Visit's Progress    COMPLETED: Care Coordination Activities- no follow up required        Care Coordination Interventions: Active listening / Reflection utilized  Emotional Support Provided Problem [place] strategies reviewed Discussed/.Educated Care Coordination Program 2.   Discussed/.Educated Annual Wellness Visit 3.   Discussed/.Educated Social Determinates of Health 4.   Please inform PCP if services needed in the future         SDOH assessments and interventions completed:  Yes  SDOH Interventions Today    Flowsheet Row Most Recent Value  SDOH Interventions   Food Insecurity Interventions Intervention Not Indicated  Transportation Interventions Intervention Not Indicated        Care Coordination Interventions Activated:  Yes  Care Coordination Interventions:  Yes, provided   Follow up plan: No further intervention required.   Encounter Outcome:  Pt. Visit Completed   Lazaro Arms RN, BSN, [facility]   Phone: [phone]

## 2021-12-13 ENCOUNTER — Encounter: Payer: Self-pay | Admitting: Family Medicine

## 2021-12-14 ENCOUNTER — Other Ambulatory Visit: Payer: Medicare Other

## 2021-12-16 ENCOUNTER — Other Ambulatory Visit: Payer: Self-pay | Admitting: Family Medicine

## 2021-12-16 DIAGNOSIS — G629 Polyneuropathy, unspecified: Secondary | ICD-10-CM

## 2021-12-17 ENCOUNTER — Other Ambulatory Visit (INDEPENDENT_AMBULATORY_CARE_PROVIDER_SITE_OTHER): Payer: Medicare Other

## 2021-12-17 DIAGNOSIS — Z7901 Long term (current) use of anticoagulants: Secondary | ICD-10-CM

## 2021-12-17 LAB — POCT INR: INR: 1.2 — AB (ref 2.0–3.0)

## 2021-12-24 ENCOUNTER — Other Ambulatory Visit: Payer: Self-pay | Admitting: Family Medicine

## 2021-12-24 ENCOUNTER — Other Ambulatory Visit (INDEPENDENT_AMBULATORY_CARE_PROVIDER_SITE_OTHER): Payer: Medicare Other

## 2021-12-24 DIAGNOSIS — Z7901 Long term (current) use of anticoagulants: Secondary | ICD-10-CM | POA: Diagnosis not present

## 2021-12-24 LAB — POCT INR: INR: 3 (ref 2.0–3.0)

## 2021-12-25 ENCOUNTER — Other Ambulatory Visit: Payer: Self-pay | Admitting: Physician Assistant

## 2021-12-25 ENCOUNTER — Other Ambulatory Visit (HOSPITAL_COMMUNITY): Payer: Self-pay | Admitting: Physician Assistant

## 2021-12-25 DIAGNOSIS — R109 Unspecified abdominal pain: Secondary | ICD-10-CM | POA: Diagnosis not present

## 2021-12-25 DIAGNOSIS — R112 Nausea with vomiting, unspecified: Secondary | ICD-10-CM | POA: Diagnosis not present

## 2021-12-25 DIAGNOSIS — K52831 Collagenous colitis: Secondary | ICD-10-CM | POA: Diagnosis not present

## 2021-12-25 DIAGNOSIS — R634 Abnormal weight loss: Secondary | ICD-10-CM | POA: Diagnosis not present

## 2021-12-25 DIAGNOSIS — K219 Gastro-esophageal reflux disease without esophagitis: Secondary | ICD-10-CM | POA: Diagnosis not present

## 2021-12-25 DIAGNOSIS — R1084 Generalized abdominal pain: Secondary | ICD-10-CM

## 2021-12-25 DIAGNOSIS — R1310 Dysphagia, unspecified: Secondary | ICD-10-CM

## 2021-12-25 DIAGNOSIS — K529 Noninfective gastroenteritis and colitis, unspecified: Secondary | ICD-10-CM | POA: Diagnosis not present

## 2021-12-25 DIAGNOSIS — Z9884 Bariatric surgery status: Secondary | ICD-10-CM | POA: Diagnosis not present

## 2021-12-26 ENCOUNTER — Ambulatory Visit
Admission: RE | Admit: 2021-12-26 | Discharge: 2021-12-26 | Disposition: A | Discharge status: Home / Self Care | Payer: Medicare Other | Source: Ambulatory Visit | Attending: Physician Assistant | Admitting: Physician Assistant

## 2021-12-26 DIAGNOSIS — R112 Nausea with vomiting, unspecified: Secondary | ICD-10-CM

## 2021-12-26 DIAGNOSIS — R1310 Dysphagia, unspecified: Secondary | ICD-10-CM

## 2021-12-26 DIAGNOSIS — K224 Dyskinesia of esophagus: Secondary | ICD-10-CM | POA: Diagnosis not present

## 2021-12-29 ENCOUNTER — Ambulatory Visit (HOSPITAL_COMMUNITY)
Admission: RE | Admit: 2021-12-29 | Discharge: 2021-12-29 | Disposition: A | Discharge status: Home / Self Care | Payer: Medicare Other | Source: Ambulatory Visit | Attending: Physician Assistant | Admitting: Physician Assistant

## 2021-12-29 DIAGNOSIS — R1084 Generalized abdominal pain: Secondary | ICD-10-CM | POA: Insufficient documentation

## 2021-12-29 DIAGNOSIS — R634 Abnormal weight loss: Secondary | ICD-10-CM | POA: Diagnosis not present

## 2021-12-29 MED ORDER — IOHEXOL 300 MG/ML  SOLN
100.0000 mL | Freq: Once | INTRAMUSCULAR | Status: AC | PRN
  Administered 2021-12-29: 100 mL via INTRAVENOUS

## 2022-01-02 ENCOUNTER — Encounter: Payer: Self-pay | Admitting: Family Medicine

## 2022-01-02 ENCOUNTER — Ambulatory Visit (INDEPENDENT_AMBULATORY_CARE_PROVIDER_SITE_OTHER): Payer: Medicare Other | Admitting: Family Medicine

## 2022-01-02 VITALS — BP 100/58 | HR 92 | Wt 177.0 lb

## 2022-01-02 DIAGNOSIS — K52831 Collagenous colitis: Secondary | ICD-10-CM

## 2022-01-02 DIAGNOSIS — I10 Essential (primary) hypertension: Secondary | ICD-10-CM

## 2022-01-02 DIAGNOSIS — Z7901 Long term (current) use of anticoagulants: Secondary | ICD-10-CM | POA: Diagnosis not present

## 2022-01-02 LAB — PROTIME-INR
INR: >10 (ref 0.9–1.2)
Prothrombin Time: 105.1 s — ABNORMAL HIGH (ref 9.1–12.0)

## 2022-01-02 MED ORDER — FUROSEMIDE 20 MG PO TABS: ORAL_TABLET | ORAL | 3 refills | Status: DC

## 2022-01-02 MED ORDER — HYDROCODONE-ACETAMINOPHEN 5-325 MG PO TABS: 1.0000 | ORAL_TABLET | Freq: Four times a day (QID) | ORAL | 0 refills | Status: AC | PRN

## 2022-01-02 MED ORDER — PHYTONADIONE 5 MG PO TABS
5.0000 mg | ORAL_TABLET | Freq: Once | ORAL | Status: AC
  Administered 2022-01-02: 5 mg via ORAL

## 2022-01-02 NOTE — Patient Instructions (Addendum)
[name] increasing the lasix to 2 tabs twice a day  MyChart me in a week and let me know how the edema is doing MyChart me earlier with any issues or concerns  ADD PROTEIN (supplement drinks, eggs, meat cheese) etc every day.  STOP coumadin until we see you on Monday Let me know if  you have ANY issues

## 2022-01-02 NOTE — Progress Notes (Signed)
INR >10.0, patient advised to hold coumadin through the weekend and recheck INR on Monday. 5 mg Vit K given today at 12:05. Patient given precautions to go to the ED given any uncontrolled bleeding or unexplained bruising. [name]

## 2022-01-03 ENCOUNTER — Ambulatory Visit (INDEPENDENT_AMBULATORY_CARE_PROVIDER_SITE_OTHER): Payer: Medicare Other

## 2022-01-03 ENCOUNTER — Ambulatory Visit: Payer: Medicare Other | Admitting: Podiatry

## 2022-01-03 ENCOUNTER — Telehealth: Payer: Self-pay | Admitting: Podiatry

## 2022-01-03 DIAGNOSIS — T8484XA Pain due to internal orthopedic prosthetic devices, implants and grafts, initial encounter: Secondary | ICD-10-CM

## 2022-01-03 DIAGNOSIS — M2041 Other hammer toe(s) (acquired), right foot: Secondary | ICD-10-CM

## 2022-01-03 DIAGNOSIS — L600 Ingrowing nail: Secondary | ICD-10-CM | POA: Diagnosis not present

## 2022-01-03 DIAGNOSIS — M2042 Other hammer toe(s) (acquired), left foot: Secondary | ICD-10-CM | POA: Diagnosis not present

## 2022-01-03 MED ORDER — ESOMEPRAZOLE MAGNESIUM 20 MG PO CPDR: 40.0000 mg | DELAYED_RELEASE_CAPSULE | Freq: Every day | ORAL | 0 refills | Status: DC

## 2022-01-03 NOTE — Telephone Encounter (Signed)
DOS: 01/31/2022 (Office Surgery)  [employer] Effective 02/18/2021  Removal Fixation Deep Kwire/Screw 2nd Rt (20680) Excision Nail Permanent 2nd Lt (11750)  DX: Z48.89 and L60.0  Deductible: $0 Out-of-Pocket: $3,600 with $1,828.83 remaining CoInsurance:0% Copay: $20  Prior authorization is not required per [employer].  Decision ID #:[id number]

## 2022-01-03 NOTE — Progress Notes (Signed)
Subjective:  Patient ID: [name], female    DOB: 1950-03-21,  MRN: [id number]  Chief Complaint  Patient presents with   Foot Problem    Bilateral 2nd toe, hammertoe, painful. Patient had surgery with Dr. Valentina Lucks for hammertoe on the right foot.      71 y.o. female presents with concern for bilateral 2nd toe pain.  She has had prior surgery with Dr. Rolley Sims on the right foot.  She states both of these hammertoe deformities are painful.  On the right second toe the concern is for pain at the distal tip of the toe.  Patient does still have retained surgical hardware present in the right second toe.  On the left second toe the pain is primarily with the nail which seems to be possibly ingrowing.     Past Medical History:  Diagnosis Date   Anemia    take iron   Anxiety    Arthritis    Depression    Dysrhythmia 2016   occ due to caffeine intake   Encounter for long-term (current) use of other medications    GERD (gastroesophageal reflux disease)    Hepatitis    age 12   History of hiatal hernia    Hypertension    IBS (irritable bowel syndrome)    Obesity    Osteoarthritis    Pain in joint, multiple sites    Pneumonia 2010   PONV (postoperative nausea and vomiting)    in the 1980's no problems since   Psoriatic arthropathy (HCC)    Pulmonary embolism ([place]) 12/03/2016   submassive/notes 12/03/2016   Raynaud disease     Allergies  Allergen Reactions   Augmentin [Amoxicillin-Pot Clavulanate] Diarrhea   Vioxx [Rofecoxib] Swelling    EXTREME LEG SWELLING    Bactrim [Sulfamethoxazole-Trimethoprim] Diarrhea and Nausea And Vomiting   Banana Itching and Rash   Cephalosporins Diarrhea, Nausea And Vomiting and Other (See Comments)    Cephalexin doses > '250mg'$  cause abdominal pain   Ciprofloxacin Diarrhea and Nausea And Vomiting   Coconut Flavor Itching and Rash   Codeine Nausea Only   Zithromax [Azithromycin] Diarrhea and Nausea And Vomiting    ROS: Negative except as per  HPI above  Objective:  General: AAO x3, NAD  Dermatological: At the distal tuft of the right second toe there is mild discoloration and prominence felt related to the screw head that is present at that location.  No open wound at this time however it looks to be a preulcerative hyperkeratotic lesion.  Left second toe with mild erythema and edema of the distal tuft of the toe with pain on palpation of the second digit no open wounds or drainage.  Vascular:  Dorsalis Pedis artery and Posterior Tibial artery pedal pulses are 2/4 bilateral.  Capillary fill time < 3 sec to all digits.   Neruologic: Grossly intact via light touch bilateral. Protective threshold intact to all sites bilateral.   Musculoskeletal: A attention directed to the right second toe there is noted to be pain on palpation of the distal tuft of the right second toe with prominence felt from underlying screw head.  No open wound is present at the distal tip of the right second toe.  On the left second toe there is pain with palpation of the second toenail.  Gait: Unassisted, Nonantalgic.   No images are attached to the encounter.  Radiographs:  Date: 01/03/22 XR the bilateral foot Weightbearing AP/Lateral/Oblique   Findings: On the right foot attention directed  the second toe there is no to be surgical screw intact protruding at the distal aspect of the distal phalanx with the screw head prominent and very little skin overlying the screw head.  There is transverse plane deformity of the right second toe.  Left second toe appears without significant deformity and no retained hardware. Assessment:   1. Painful orthopaedic hardware ([place])   2. Ingrown nail of second toe of left foot   3. Hammertoes of both feet      Plan:  Patient was evaluated and treated and all questions answered.  #Painful retained surgical screw present in the right second toe -I was the patient that based on the patient's location of pain as well as  the x-ray I believe that the screw is prominent at the distal tip of the right second toe. -Believe that she would benefit from removal of the screw. -Patient is agreeable to proceed.  We will have to wait for her INR to improve as she says it is recently been elevated to approximately 10 and it needs to come down before we can proceed with surgical intervention.  She will need to confirm with her primary that her INR has improved to acceptable level prior to the surgery -Discussed with the patient the risk benefits alternatives and possible complications.  Discussed expected postoperative course.  Patient understands and wishes to proceed surgical consent was obtained at this visit. -Plan will include screw removal right second toe.  Begin surgical planning.  We will plan to do this in the office under local anesthesia  #Ingrown nail of the left second toenail -Recommend total nail avulsion with phenol and alcohol chemical matrixectomy -We will plan to proceed with this at the same time that the screw was removed on the right second toe.          Everitt Amber, DPM [facility]

## 2022-01-03 NOTE — Assessment & Plan Note (Signed)
Quite elevated INR today.  She return for 5 mg of vitamin K.  She will hold off on taking any Coumadin until we see her back for lab on Monday.  She knows to call us immediately with any signs of bleeding.  I really wish we could find a way to switch her to [place].  Barrier is called through

## 2022-01-03 NOTE — Assessment & Plan Note (Signed)
She is following with GI for this.  I do suspect this is part of her weight loss and nausea.  We will check prealbumin.  I suspect that this contributing to her lower extremity edema.  We will also have her take regular dose of furosemide for the next week and then let me know via MyChart whether or not her edema is improving.  Labs today.

## 2022-01-03 NOTE — Assessment & Plan Note (Signed)
Good control.  Continue current.  Get labs.

## 2022-01-03 NOTE — Progress Notes (Addendum)
    CHIEF COMPLAINT / HPI: #1.  Anticoagulation: Point-of-care INR was elevated.  Sending on the stat venous sample.  We will call her with results 2.  Follow-up nausea, difficulty eating and weight loss.  She has been seen by GI.  They have scheduled several more tests for her.  They have started her on colestipol for her diarrhea.  They have also increased her Nexium to 40 mg a day and added famotidine daily. 3.  Noticing that she has some increased lower extremity swelling.  She had been intermittently taking her Lasix but when the leg swelling started she has increased it and on 2 days in the last week she is taking 80 mg with little benefit. 4.  Energy level still somewhat decreased.  Appetite and food intake are also decreased.   PERTINENT  PMH / PSH: I have reviewed the patient's medications, allergies, past medical and surgical history, smoking status and updated in the EMR as appropriate.   OBJECTIVE:  BP (!) 100/58   Pulse 92   Wt 177 lb (80.3 kg)   SpO2 97%   BMI 36.36 kg/m   GENERAL: Well-developed female no acute distress CV: Regular rate and rhythm LUNGS: Clear to auscultation bilaterally with good lung sounds in all fields. ABDOMEN: Soft, positive bowel sounds EXTREMITY: Movement of extremity x4.  Lower extremity trace pitting edema up to level of the knee bilaterally. VASCULAR: Dorsalis pedis pulses are 1-2+ bilaterally symmetrical. ASSESSMENT / PLAN:   Collagenous colitis She is following with GI for this.  I do suspect this is part of her weight loss and nausea.  We will check prealbumin.  I suspect that this contributing to her lower extremity edema.  We will also have her take regular dose of furosemide for the next week and then let me know via MyChart whether or not her edema is improving.  Labs today.  Essential hypertension Good control.  Continue current.  Get labs.  Chronic anticoagulation Quite elevated INR today.  She return for 5 mg of vitamin K.  She  will hold off on taking any Coumadin until we see her back for lab on Monday.  She knows to call us immediately with any signs of bleeding.  I really wish we could find a way to switch her to [place].  Barrier is called through   Dorcas Mcmurray MD

## 2022-01-03 NOTE — Addendum Note (Signed)
Addended byDorcas Mcmurray L on: 01/03/2022 11:06 AM   Modules accepted: Orders

## 2022-01-07 ENCOUNTER — Other Ambulatory Visit (INDEPENDENT_AMBULATORY_CARE_PROVIDER_SITE_OTHER): Payer: Medicare Other

## 2022-01-07 DIAGNOSIS — Z7901 Long term (current) use of anticoagulants: Secondary | ICD-10-CM | POA: Diagnosis not present

## 2022-01-07 LAB — COMPREHENSIVE METABOLIC PANEL
ALT: 32 IU/L (ref 0–32)
AST: 28 IU/L (ref 0–40)
Albumin/Globulin Ratio: 0.9 — ABNORMAL LOW (ref 1.2–2.2)
Albumin: 2.2 g/dL — ABNORMAL LOW (ref 3.9–4.9)
Alkaline Phosphatase: 190 IU/L — ABNORMAL HIGH (ref 44–121)
BUN/Creatinine Ratio: 27 (ref 12–28)
BUN: 14 mg/dL (ref 8–27)
Bilirubin Total: 0.2 mg/dL (ref 0.0–1.2)
CO2: 25 mmol/L (ref 20–29)
Calcium: 7.9 mg/dL — ABNORMAL LOW (ref 8.7–10.3)
Chloride: 96 mmol/L (ref 96–106)
Creatinine, Ser: 0.51 mg/dL — ABNORMAL LOW (ref 0.57–1.00)
Globulin, Total: 2.5 g/dL (ref 1.5–4.5)
Glucose: 75 mg/dL (ref 70–99)
Potassium: 4.7 mmol/L (ref 3.5–5.2)
Sodium: 136 mmol/L (ref 134–144)
Total Protein: 4.7 g/dL — ABNORMAL LOW (ref 6.0–8.5)
eGFR: 101 mL/min/{1.73_m2} (ref >59)

## 2022-01-07 LAB — PROTIME-INR

## 2022-01-07 LAB — PREALBUMIN: PREALBUMIN: 8 mg/dL — ABNORMAL LOW (ref 10–36)

## 2022-01-07 LAB — POCT INR: INR: 1 — AB (ref 2.0–3.0)

## 2022-01-08 ENCOUNTER — Encounter: Payer: Self-pay | Admitting: Family Medicine

## 2022-01-14 ENCOUNTER — Other Ambulatory Visit (INDEPENDENT_AMBULATORY_CARE_PROVIDER_SITE_OTHER): Payer: Medicare Other

## 2022-01-14 DIAGNOSIS — Z7901 Long term (current) use of anticoagulants: Secondary | ICD-10-CM | POA: Diagnosis not present

## 2022-01-14 LAB — POCT INR: INR: 1 — AB (ref 2.0–3.0)

## 2022-01-18 ENCOUNTER — Other Ambulatory Visit: Payer: Medicare Other

## 2022-01-21 ENCOUNTER — Other Ambulatory Visit (INDEPENDENT_AMBULATORY_CARE_PROVIDER_SITE_OTHER): Payer: Medicare Other

## 2022-01-21 DIAGNOSIS — Z7901 Long term (current) use of anticoagulants: Secondary | ICD-10-CM | POA: Diagnosis not present

## 2022-01-21 LAB — POCT INR: INR: 1 — AB (ref 2.0–3.0)

## 2022-01-22 DIAGNOSIS — K589 Irritable bowel syndrome without diarrhea: Secondary | ICD-10-CM | POA: Diagnosis not present

## 2022-01-22 DIAGNOSIS — K219 Gastro-esophageal reflux disease without esophagitis: Secondary | ICD-10-CM | POA: Diagnosis not present

## 2022-01-22 DIAGNOSIS — K529 Noninfective gastroenteritis and colitis, unspecified: Secondary | ICD-10-CM | POA: Diagnosis not present

## 2022-01-25 ENCOUNTER — Other Ambulatory Visit (INDEPENDENT_AMBULATORY_CARE_PROVIDER_SITE_OTHER): Payer: Medicare Other

## 2022-01-25 DIAGNOSIS — Z7901 Long term (current) use of anticoagulants: Secondary | ICD-10-CM | POA: Diagnosis not present

## 2022-01-25 LAB — POCT INR: INR: 1.1 — AB (ref 2.0–3.0)

## 2022-01-30 ENCOUNTER — Ambulatory Visit (INDEPENDENT_AMBULATORY_CARE_PROVIDER_SITE_OTHER): Payer: Medicare Other | Admitting: Family Medicine

## 2022-01-30 ENCOUNTER — Encounter: Payer: Self-pay | Admitting: Family Medicine

## 2022-01-30 VITALS — BP 104/62 | HR 82 | Ht 58.5 in | Wt 162.6 lb

## 2022-01-30 DIAGNOSIS — Z7901 Long term (current) use of anticoagulants: Secondary | ICD-10-CM | POA: Diagnosis not present

## 2022-01-30 DIAGNOSIS — L4050 Arthropathic psoriasis, unspecified: Secondary | ICD-10-CM | POA: Diagnosis not present

## 2022-01-30 DIAGNOSIS — R5383 Other fatigue: Secondary | ICD-10-CM | POA: Diagnosis not present

## 2022-01-30 DIAGNOSIS — K52831 Collagenous colitis: Secondary | ICD-10-CM | POA: Diagnosis not present

## 2022-01-30 DIAGNOSIS — L959 Vasculitis limited to the skin, unspecified: Secondary | ICD-10-CM | POA: Diagnosis not present

## 2022-01-30 DIAGNOSIS — M2041 Other hammer toe(s) (acquired), right foot: Secondary | ICD-10-CM

## 2022-01-30 DIAGNOSIS — Z23 Encounter for immunization: Secondary | ICD-10-CM | POA: Diagnosis not present

## 2022-01-30 LAB — POCT INR: INR: 1.1 — AB (ref 2.0–3.0)

## 2022-01-31 ENCOUNTER — Ambulatory Visit: Payer: Medicare Other | Admitting: Podiatry

## 2022-01-31 DIAGNOSIS — T8484XA Pain due to internal orthopedic prosthetic devices, implants and grafts, initial encounter: Secondary | ICD-10-CM

## 2022-01-31 DIAGNOSIS — L600 Ingrowing nail: Secondary | ICD-10-CM

## 2022-01-31 MED ORDER — OXYCODONE-ACETAMINOPHEN 5-325 MG PO TABS: 1.0000 | ORAL_TABLET | Freq: Four times a day (QID) | ORAL | 0 refills | Status: AC | PRN

## 2022-01-31 MED ORDER — CLINDAMYCIN HCL 300 MG PO CAPS: 300.0000 mg | ORAL_CAPSULE | Freq: Three times a day (TID) | ORAL | 0 refills | Status: AC

## 2022-01-31 NOTE — Progress Notes (Signed)
    CHIEF COMPLAINT / HPI:   #1.  Follow-up INR: 3 and half weeks ago we had to give her vitamin K for INR greater than 10.  Since then, despite increasing her dosage we have failed to achieve appropriate anticoagulation.  She does admit to taking 2 multivitamins a day. 2.  Feeling much better now that she started drinking protein shakes in the middle of the day.  Says energy is better. 3.  Noticing that she is having some petechiae on her feet and some toe pain.  This is similar to symptoms she had when she experienced her first episode of vasculitis.  She is somewhat concerned.  No shortness of breath.  Other than her feet, she has no areas of petechiae.  PERTINENT  PMH / PSH: I have reviewed the patient's medications, allergies, past medical and surgical history, smoking status and updated in the EMR as appropriate. Leukocytoclastic vasculitis 2018 History of collagenous colitis History of seronegative arthritis History of psoriasis previously followed by Dr. Lenna Gilford.  Previously on Remicade.  OBJECTIVE:  BP 104/62   Pulse 82   Ht 4' 10.5" (1.486 m)   Wt 162 lb 9.6 oz (73.8 kg)   SpO2 98%   BMI 33.41 kg/m   Vital signs reviewed. GENERAL: Well-developed, well-nourished, no acute distress. CARDIOVASCULAR: Regular rate and rhythm no murmur gallop or rub LUNGS: Clear to auscultation bilaterally, no rales or wheeze. ABDOMEN: Soft positive bowel sounds NEURO: No gross focal neurological deficits. MSK: Movement of extremity x 4. SKIN: Several petechiae on the dorsal person of both feet.  They do not extend to any other part of the foot or ankle. PSYCH: AxOx4. Good eye contact.. No psychomotor retardation or agitation. Appropriate speech fluency and content. Asks and answers questions appropriately. Mood is congruent.  ASSESSMENT / PLAN:   Hx vasculitis leukocytoclastic 2018 Concerned that she is having resurgence of her vasculitis.  This is additionally concerning given her  subtherapeutic INR.  Will refer her back to rheumatology.  If things worsen as and if she gets larger area of petechiae, she will let me know.  Chronic anticoagulation Will her multivitamin to daily.  [facility]  I really wish we could get her on DOAC but insurance has refused that.  [place] She has seen her podiatrist in they have found that she has a pin that is working its way out of the soft tissue.  She has planned removal in the upcoming week.  Hopefully this will improve her toe pain.  Other fatigue Improved with supplementation of protein shakes. Continue   Dorcas Mcmurray MD

## 2022-01-31 NOTE — Progress Notes (Signed)
Subjective:  Patient ID: [name], female    DOB: [birth year],  MRN: [id number]  CC: Patient presenting for an office surgical procedure including bilateral second toe phenol and alcohol matrixectomy of the total toenail as well as right second toe screw removal   71 y.o. female presents for an office procedure.  Previously we discussed removal of a screw running longitudinally down the right second toe that is prominent at its distal aspect as it is nearly protruding through the skin and causing irritation with shoe gear usage.  Additionally the patient has pain with bilateral second toenails and is electing to have both removed with chemical matrixectomy to be performed so as to prevent regrowth of the nails.  Patient has had her INR checked recently and is found to be normal at 1.1 safe to proceed with surgical procedure.  She denies any nausea vomiting fever chills.   Past Medical History:  Diagnosis Date   Anemia    take iron   Anxiety    Arthritis    Depression    Dysrhythmia 2016   occ due to caffeine intake   Encounter for long-term (current) use of other medications    GERD (gastroesophageal reflux disease)    Hepatitis    age 27   History of hiatal hernia    Hypertension    IBS (irritable bowel syndrome)    Obesity    Osteoarthritis    Pain in joint, multiple sites    Pneumonia 2010   PONV (postoperative nausea and vomiting)    in the 1980's no problems since   Psoriatic arthropathy (HCC)    Pulmonary embolism ([place]) 12/03/2016   submassive/notes 12/03/2016   Raynaud disease     Allergies  Allergen Reactions   Augmentin [Amoxicillin-Pot Clavulanate] Diarrhea   Vioxx [Rofecoxib] Swelling    EXTREME LEG SWELLING    Bactrim [Sulfamethoxazole-Trimethoprim] Diarrhea and Nausea And Vomiting   Banana Itching and Rash   Cephalosporins Diarrhea, Nausea And Vomiting and Other (See Comments)    Cephalexin doses > '250mg'$  cause abdominal pain   Ciprofloxacin Diarrhea  and Nausea And Vomiting   Coconut Flavor Itching and Rash   Codeine Nausea Only   Zithromax [Azithromycin] Diarrhea and Nausea And Vomiting    ROS: Negative except as per HPI above  Objective:  General: AAO x3, NAD  Dermatological: At the distal tuft of the right second toe there is mild discoloration and prominence felt related to the screw head that is present at that location.  No open wound at this time however it looks to be a preulcerative hyperkeratotic lesion.  Left second toe with mild erythema and edema of the distal tuft of the toe with pain on palpation of the second digit no open wounds or drainage. Also with pain on palpation of right 2nd toenail.   Vascular:  Dorsalis Pedis artery and Posterior Tibial artery pedal pulses are 2/4 bilateral.  Capillary fill time < 3 sec to all digits.   Neruologic: Grossly intact via light touch bilateral. Protective threshold intact to all sites bilateral.   Musculoskeletal: A attention directed to the right second toe there is noted to be pain on palpation of the distal tuft of the right second toe with prominence felt from underlying screw head.  No open wound is present at the distal tip of the right second toe.  On the right and left second toe there is pain with palpation of the second toenail.  Gait: Unassisted, Nonantalgic.   No  images are attached to the encounter.  Radiographs:  Date: 01/03/22 XR the bilateral foot Weightbearing AP/Lateral/Oblique   Findings: On the right foot attention directed the second toe there is no to be surgical screw intact protruding at the distal aspect of the distal phalanx with the screw head prominent and very little skin overlying the screw head.  There is transverse plane deformity of the right second toe.  Left second toe appears without significant deformity and no retained hardware. Assessment:   1. Painful orthopaedic hardware ([place])   2. Ingrown nail of second toe of left foot   3. Ingrown  nail of second toe of right foot      Plan:  Patient was evaluated and treated and all questions answered.  #Painful retained surgical screw present in the right second toe -Patient agrees to proceed with second toe hardware removal of the right second toe. -Proceed with in office procedure under sterile conditions as below -See procedure note below.  The prominent screw from the right second toe was removed without complication. -Patient will follow-up in 1 week for recheck of this surgical site.  Procedure: Removal of deep implant right second toe Location: Right 2nd toe Anesthesia: Lidocaine 1% plain; 1.5 mL and Marcaine 0.5% plain; 1.5 mL, digital block. Skin Prep: chloro prep Tourniquet: ankle tourniquet inflated to 250 mm Hg, 15 min Dressing: Silvadene; telfa; dry, sterile, compression dressing. Techiniue: Attention was directed to the right foot were the previous surgical incision was noted at the distal tuft of the right 2nd toe. Using the previous incision, a #15 blade was used to incise through skin. This was deepened through subcutaneous tissues with sharp and blunt dissection and care was taken to protect all vital structures. A combination of sharp and dull dissection was used to free periosteal and fibrotic tissues from the hardware. At this time, the hardware was well visualized and was removed without complication.  The area was then flushed with copious amounts of sterile saline. Then using nylon suture 4-0, the site was closed in anatomic layers and the skin was well approximated under minimal tension. Dressing was applied.   #Ingrown nail of the right and left second toenail Ingrown Nail, bilaterally 2nd toenail -Patient elects to proceed with minor surgery to remove ingrown toenail today. Consent reviewed and signed by patient. -Ingrown nail excised. See procedure note. -Educated on post-procedure care including soaking. Written instructions provided and reviewed. -Will  have the patient soak only the left foot and have deferred her on the right foot for a few days prior to beginning soaks due to open incision at the distal tuft of the toe. -Patient to follow up in 2 weeks for nail check.  Procedure: Excision of Ingrown Toenail Location: Bilateral 2nd toe total nail P&A Anesthesia: Lidocaine 1% plain; 1.5 mL and Marcaine 0.5% plain; 1.5 mL, digital block. Skin Prep:Chloro prep  Dressing: Silvadene; telfa; dry, sterile, compression dressing. Technique: Following skin prep, the toe was exsanguinated and a tourniquet was secured at the base of the toe. The affected nail border was freed, split with a nail splitter, and excised. Chemical matrixectomy was then performed with phenol and irrigated out with alcohol. The tourniquet was then removed and sterile dressing applied. Disposition: Patient tolerated procedure well. Patient to return in 2 weeks for follow-up.          Everitt Amber, DPM [facility]

## 2022-01-31 NOTE — Assessment & Plan Note (Signed)
Improved with supplementation of protein shakes. Continue

## 2022-01-31 NOTE — Patient Instructions (Signed)

## 2022-01-31 NOTE — Assessment & Plan Note (Signed)
Concerned that she is having resurgence of her vasculitis.  This is additionally concerning given her subtherapeutic INR.  Will refer her back to rheumatology.  If things worsen as and if she gets larger area of petechiae, she will let me know.

## 2022-01-31 NOTE — Assessment & Plan Note (Signed)
Will her multivitamin to daily.  [facility]  I really wish we could get her on DOAC but insurance has refused that.

## 2022-01-31 NOTE — Assessment & Plan Note (Addendum)
She has seen her podiatrist in they have found that she has a pin that is working its way out of the soft tissue.  She has planned removal in the upcoming week.  Hopefully this will improve her toe pain.

## 2022-02-04 ENCOUNTER — Other Ambulatory Visit: Payer: Self-pay | Admitting: Family Medicine

## 2022-02-04 DIAGNOSIS — Z7901 Long term (current) use of anticoagulants: Secondary | ICD-10-CM

## 2022-02-04 DIAGNOSIS — H52223 Regular astigmatism, bilateral: Secondary | ICD-10-CM | POA: Diagnosis not present

## 2022-02-04 DIAGNOSIS — H2513 Age-related nuclear cataract, bilateral: Secondary | ICD-10-CM | POA: Diagnosis not present

## 2022-02-04 DIAGNOSIS — H5213 Myopia, bilateral: Secondary | ICD-10-CM | POA: Diagnosis not present

## 2022-02-05 ENCOUNTER — Other Ambulatory Visit (INDEPENDENT_AMBULATORY_CARE_PROVIDER_SITE_OTHER): Payer: Medicare Other

## 2022-02-05 DIAGNOSIS — Z7901 Long term (current) use of anticoagulants: Secondary | ICD-10-CM | POA: Diagnosis not present

## 2022-02-05 LAB — POCT INR: INR: 1.4 — AB (ref 2.0–3.0)

## 2022-02-07 ENCOUNTER — Ambulatory Visit (INDEPENDENT_AMBULATORY_CARE_PROVIDER_SITE_OTHER): Payer: Medicare Other

## 2022-02-07 ENCOUNTER — Ambulatory Visit (INDEPENDENT_AMBULATORY_CARE_PROVIDER_SITE_OTHER): Payer: Medicare Other | Admitting: Podiatry

## 2022-02-07 DIAGNOSIS — L600 Ingrowing nail: Secondary | ICD-10-CM

## 2022-02-07 DIAGNOSIS — T8484XA Pain due to internal orthopedic prosthetic devices, implants and grafts, initial encounter: Secondary | ICD-10-CM

## 2022-02-07 DIAGNOSIS — Z9889 Other specified postprocedural states: Secondary | ICD-10-CM | POA: Diagnosis not present

## 2022-02-07 NOTE — Progress Notes (Signed)
  Subjective:  Patient ID: [name], female    DOB: Oct 16, 1950,  MRN: [id number]  Chief Complaint  Patient presents with   Routine Post Op    POV1  4/10 pain during the day 9/10 pain during the day    DOS: 01/31/2022  Procedure: Second toe hardware removal right foot.  Bilateral second toenail total nail phenol and alcohol matrixectomy  71 y.o. female returns for post-op check.  She has been to well since last visit.  States the pain has decreased over the past couple days and he no longer has much pain she is walking in sandals.  She has been compliant with soaks including Epsom salt soaks to the bilateral foot.  Has been keeping the toes covered with bandage/Telfa pad no drainage no redness no nausea vomiting fever chills.  Review of Systems: Negative except as noted in the HPI. Denies N/V/F/Ch.   Objective:  There were no vitals filed for this visit. There is no height or weight on file to calculate BMI. Constitutional Well developed. Well nourished.  Vascular Foot warm and well perfused. Capillary refill normal to all digits.  Calf is soft and supple, no posterior calf or knee pain, negative Homans' sign  Neurologic Normal speech. Oriented to person, place, and time. Epicritic sensation to light touch grossly present bilaterally.  Dermatologic Skin healing well without signs of infection. Skin edges well coapted without signs of infection.  Orthopedic: Tenderness to palpation noted about the surgical site.   Multiple view plain film radiographs: XR 3 views right foot weightbearing AP lateral oblique.  Findings: Attention directed to the right second toe there is of the interval removal of the longitudinal screw placed on the shaft of the second toe.   Assessment:   1. Post-operative state   2. Painful orthopaedic hardware ([place])   3. Ingrown nail of second toe of left foot   4. Ingrown nail of second toe of right foot    Plan:  Patient was evaluated and treated and  all questions answered.  S/p foot surgery bilaterally, in office procedure -Progressing as expected post-operatively. Nail removal and matrixecomty sites healing as expected.  -Continue with Epsom salt soaks daily for the next 2 weeks. -XR: As above no evidence of postoperative complication -WB Status: Weightbearing as tolerated in postoperative shoe for another few days followed by regular shoes -Sutures: Removed from the tip of the right second toe at this visit. -Medications: Ibuprofen as needed for pain control -Foot redressed with Silvadene cream to the nail matrixectomy sites bilateral second toe with Band-Aid overlying.  Return in about 2 weeks (around 02/21/2022) for 2nd POV R 2nd toe screw removal.         Everitt Amber, [place] [facility]

## 2022-02-08 ENCOUNTER — Telehealth: Payer: Self-pay | Admitting: Family Medicine

## 2022-02-08 NOTE — Telephone Encounter (Signed)
Left message for patient to call back and schedule the Medicare Annual Wellness Visit (AWV) virtually or by telephone.  Last AWV 09/14/20  Please schedule at anytime with [facility]-Nurse Health Advisor.    Any questions, please call me at [phone] 

## 2022-02-13 ENCOUNTER — Other Ambulatory Visit (INDEPENDENT_AMBULATORY_CARE_PROVIDER_SITE_OTHER): Payer: Medicare Other

## 2022-02-13 DIAGNOSIS — Z7901 Long term (current) use of anticoagulants: Secondary | ICD-10-CM | POA: Diagnosis not present

## 2022-02-13 LAB — POCT INR: INR: 1.7 — AB (ref 2.0–3.0)

## 2022-02-20 ENCOUNTER — Other Ambulatory Visit (INDEPENDENT_AMBULATORY_CARE_PROVIDER_SITE_OTHER): Payer: Medicare Other

## 2022-02-20 DIAGNOSIS — Z7901 Long term (current) use of anticoagulants: Secondary | ICD-10-CM | POA: Diagnosis not present

## 2022-02-20 LAB — POCT INR: INR: 1.8 — AB (ref 2.0–3.0)

## 2022-02-20 NOTE — Progress Notes (Signed)
Patient has upcoming dental surgery, multiple tooth extraction. Continue taking 7 mg coumadin through Friday. Hold coumadin Saturday through Tuesday for procedure. Resume 7 mg daily Jan 10 and follow up for INR 1 week later. Jaymes Graff Shoshanna Mcquitty

## 2022-02-21 ENCOUNTER — Ambulatory Visit (INDEPENDENT_AMBULATORY_CARE_PROVIDER_SITE_OTHER): Payer: Medicare Other | Admitting: Podiatry

## 2022-02-21 DIAGNOSIS — L600 Ingrowing nail: Secondary | ICD-10-CM

## 2022-02-21 DIAGNOSIS — T8484XA Pain due to internal orthopedic prosthetic devices, implants and grafts, initial encounter: Secondary | ICD-10-CM

## 2022-02-21 DIAGNOSIS — M2041 Other hammer toe(s) (acquired), right foot: Secondary | ICD-10-CM

## 2022-02-21 DIAGNOSIS — M2042 Other hammer toe(s) (acquired), left foot: Secondary | ICD-10-CM

## 2022-02-21 DIAGNOSIS — Z9889 Other specified postprocedural states: Secondary | ICD-10-CM

## 2022-02-21 NOTE — Progress Notes (Signed)
Subjective:  Patient ID: [name], female    DOB: 06/06/1950,  MRN: [id number]  Chief Complaint  Patient presents with   Post-op Follow-up    Follow up after screw removal , stubbed right foot     DOS: 01/31/2022  Procedure: Second toe hardware removal right foot.  Bilateral second toenail total nail phenol and alcohol matrixectomy  72 y.o. female returns for 2nd post-op check.  She has been to well since last visit.  She does report that she stubbed her right second toe since the last visit on December 28.  She has had redness and swelling in the right second toe.  Says has gotten slightly better since the injury occurred but still has some lingering swelling.  She is only able to wear sandals at this time due to pain with the phenol alcohol matrixectomy sites which are still healing.  She has discontinued the soaks approximately 1 week ago.  Denies any drainage from either toe.  Review of Systems: Negative except as noted in the HPI. Denies N/V/F/Ch.   Objective:  There were no vitals filed for this visit. There is no height or weight on file to calculate BMI. Constitutional Well developed. Well nourished.  Vascular Foot warm and well perfused. Capillary refill normal to all digits.  Calf is soft and supple, no posterior calf or knee pain, negative Homans' sign  Neurologic Normal speech. Oriented to person, place, and time. Epicritic sensation to light touch grossly present bilaterally.  Dermatologic Skin healing well without signs of infection. Skin edges well coapted without signs of infection at the second toe hardware removal incision is completely healed with no scarring.  The bilateral second toenail bed has dry eschar present no drainage.  Mild erythema and edema of the right second toe related to her injury that occurred recently.  Orthopedic: Mild tenderness to palpation noted about the right second toe however this is attributed to her recent stub of the toe..    Multiple view plain film radiographs: XR 3 views right foot weightbearing AP lateral oblique.  Findings: Attention directed to the right second toe there is of the interval removal of the longitudinal screw placed on the shaft of the second toe.   Assessment:   1. Post-operative state   2. Painful orthopaedic hardware ([place])   3. Ingrown nail of second toe of left foot   4. Ingrown nail of second toe of right foot   5. Hammertoes of both feet     Plan:  Patient was evaluated and treated and all questions answered.  S/p foot surgery bilaterally, in office procedure including screw mobile from right second toe as well as bilateral second toe total nail phenol and alcohol matrixectomy. -Patient with complication of stubbing her right second toe postoperatively with some mild edema and pain as a result.  Suggests soft tissue injury versus phalanx fracture, we will treat conservatively. -Progressing as expected post-operatively. Nail removal and matrixecomty sites healing as expected.  -Continue with Epsom salt soaks daily for the next week -XR: New x-rays deferred at this visit will consider at future visit if having further pain -WB Status: Weightbearing as tolerated in socks sandals which the patient says caused her no pain when she is ambulating -No further dressing care needed for the second toe matrixectomy site bilaterally.  Return in about 4 weeks (around 03/21/2022) for 3rd POV R 2nd toe HWR, bilateral 2nd toe P&A matrix.         Haydee Monica Siearra Amberg,  DPM [place] / [facility]

## 2022-02-25 ENCOUNTER — Telehealth: Payer: Self-pay

## 2022-02-25 NOTE — Telephone Encounter (Signed)
Patient calls nurse line regarding dental clearance form.   She reports that procedure (dental extraction) was scheduled for today, however, she was not able to due to not having clearance form completed.   This was faxed to our office this morning. She states that they have to have form back prior to rescheduling appointment.   [facility]ental [facility] is [facility] [employer] [facility].   Talbot Grumbling, RN

## 2022-02-26 NOTE — Telephone Encounter (Signed)
Called [facility]. They have not yet faxed this form. I was advised that assistant would be faxing this over later this afternoon.   Talbot Grumbling, RN

## 2022-03-01 NOTE — Telephone Encounter (Signed)
Patient is calling checking on dental form, I did not see them in doctors box. I told patient I would send a message back to see if provider had form.   Please advise.   Thanks!

## 2022-03-04 ENCOUNTER — Other Ambulatory Visit (INDEPENDENT_AMBULATORY_CARE_PROVIDER_SITE_OTHER): Payer: Medicare Other

## 2022-03-04 ENCOUNTER — Encounter: Payer: Self-pay | Admitting: Family Medicine

## 2022-03-04 DIAGNOSIS — Z7901 Long term (current) use of anticoagulants: Secondary | ICD-10-CM

## 2022-03-04 LAB — POCT INR: INR: 2.4 (ref 2.0–3.0)

## 2022-03-04 NOTE — Progress Notes (Signed)
Currently taking 7 mg daily. Patient has an upcoming dental appointment for extractions, date to be determined. She will hold coumadin prior to procedure. She is to come back this Friday for repeat INR unless she has the extractions this week at which point she was instructed to come back for INR check 5 days after restarting coumadin. Jaymes Graff Nadine Ryle

## 2022-03-06 ENCOUNTER — Other Ambulatory Visit: Payer: Medicare Other

## 2022-03-08 ENCOUNTER — Other Ambulatory Visit: Payer: Medicare Other

## 2022-03-15 ENCOUNTER — Other Ambulatory Visit (INDEPENDENT_AMBULATORY_CARE_PROVIDER_SITE_OTHER): Payer: Medicare Other

## 2022-03-15 DIAGNOSIS — Z7901 Long term (current) use of anticoagulants: Secondary | ICD-10-CM | POA: Diagnosis not present

## 2022-03-15 LAB — POCT INR: INR: 3 (ref 2.0–3.0)

## 2022-03-19 ENCOUNTER — Other Ambulatory Visit (INDEPENDENT_AMBULATORY_CARE_PROVIDER_SITE_OTHER): Payer: Medicare Other

## 2022-03-19 DIAGNOSIS — Z7901 Long term (current) use of anticoagulants: Secondary | ICD-10-CM

## 2022-03-19 LAB — POCT INR: INR: 2.7 (ref 2.0–3.0)

## 2022-03-25 DIAGNOSIS — M310 Hypersensitivity angiitis: Secondary | ICD-10-CM | POA: Diagnosis not present

## 2022-03-25 DIAGNOSIS — R600 Localized edema: Secondary | ICD-10-CM | POA: Diagnosis not present

## 2022-03-25 DIAGNOSIS — I7300 Raynaud's syndrome without gangrene: Secondary | ICD-10-CM | POA: Diagnosis not present

## 2022-03-25 DIAGNOSIS — L4050 Arthropathic psoriasis, unspecified: Secondary | ICD-10-CM | POA: Diagnosis not present

## 2022-03-25 DIAGNOSIS — M0609 Rheumatoid arthritis without rheumatoid factor, multiple sites: Secondary | ICD-10-CM | POA: Diagnosis not present

## 2022-03-25 DIAGNOSIS — L409 Psoriasis, unspecified: Secondary | ICD-10-CM | POA: Diagnosis not present

## 2022-03-25 DIAGNOSIS — I878 Other specified disorders of veins: Secondary | ICD-10-CM | POA: Diagnosis not present

## 2022-03-26 ENCOUNTER — Other Ambulatory Visit (INDEPENDENT_AMBULATORY_CARE_PROVIDER_SITE_OTHER): Payer: Medicare Other

## 2022-03-26 DIAGNOSIS — Z7901 Long term (current) use of anticoagulants: Secondary | ICD-10-CM | POA: Diagnosis not present

## 2022-03-26 LAB — POCT INR: INR: 2.1 (ref 2.0–3.0)

## 2022-03-29 LAB — POCT INR: INR: 2.5 (ref 2.0–3.0)

## 2022-04-01 ENCOUNTER — Other Ambulatory Visit (INDEPENDENT_AMBULATORY_CARE_PROVIDER_SITE_OTHER): Payer: Medicare Other

## 2022-04-01 DIAGNOSIS — Z7901 Long term (current) use of anticoagulants: Secondary | ICD-10-CM | POA: Diagnosis not present

## 2022-04-04 ENCOUNTER — Ambulatory Visit (INDEPENDENT_AMBULATORY_CARE_PROVIDER_SITE_OTHER): Payer: Medicare Other | Admitting: Podiatry

## 2022-04-04 ENCOUNTER — Ambulatory Visit (INDEPENDENT_AMBULATORY_CARE_PROVIDER_SITE_OTHER): Payer: Medicare Other

## 2022-04-04 DIAGNOSIS — T8484XA Pain due to internal orthopedic prosthetic devices, implants and grafts, initial encounter: Secondary | ICD-10-CM

## 2022-04-04 DIAGNOSIS — Z9889 Other specified postprocedural states: Secondary | ICD-10-CM

## 2022-04-04 DIAGNOSIS — L600 Ingrowing nail: Secondary | ICD-10-CM

## 2022-04-04 NOTE — Progress Notes (Signed)
Subjective:  Patient ID: [name], female    DOB: 1951/01/12,  MRN: [id number]  Chief Complaint  Patient presents with   Routine Post Op    Patient states that her foot is doing fine.    DOS: 01/31/2022  Procedure: Second toe hardware removal right foot.  Bilateral second toenail total nail phenol and alcohol matrixectomy.   72 y.o. female returns for third post-op chek.  She has been to well since last visit.  She is now 8 weeks postop.   She reports that both second toes are feeling very good no issues with regards to the total nail phenol and alcohol matrixectomy site.  Additionally the second toe on the right foot where the screw was removed from is feeling better sticking up less and no pain in the incision is healed well with no issues.  She does note that there is a small sliver of nail at the medial border of the right hallux that was previously removed by another doctor in the past that has grown back and wants to see if we can take it out today.   Review of Systems: Negative except as noted in the HPI. Denies N/V/F/Ch.   Objective:  There were no vitals filed for this visit. There is no height or weight on file to calculate BMI. Constitutional Well developed. Well nourished.  Vascular Foot warm and well perfused. Capillary refill normal to all digits.  Calf is soft and supple, no posterior calf or knee pain, negative Homans' sign  Neurologic Normal speech. Oriented to person, place, and time. Epicritic sensation to light touch grossly present bilaterally.  Dermatologic Second toenail nail beds are fully healed at this time without issue.  On the right great toe there is noted to be small sliver of residual nail after prior phenol alcohol total nail matrixectomy.  This is causing some pain.  The second toe screw removal incision is well-healed without issue  Orthopedic: No tenderness to palpation noted about the right second toe improved from prior.   Multiple view  plain film radiographs: 04/04/2022 XR 3 views right foot weightbearing AP lateral oblique.  Findings: Attention directed to the right second toe there is of the interval removal of the longitudinal screw placed on the shaft of the second toe.  No evidence of osteolysis or erosion or concern for osteomyelitis at this time. Assessment:   1. Post-operative state   2. Painful orthopaedic hardware ([place])   3. Ingrown nail of second toe of left foot   4. Ingrown nail of second toe of right foot     Plan:  Patient was evaluated and treated and all questions answered.  S/p foot surgery bilaterally, in office procedure including screw mobile from right second toe as well as bilateral second toe total nail phenol and alcohol matrixectomy. -Well-healed postoperatively patient is very satisfied with result of the nail removal as well as disc removal -Progressing as expected post-operatively. Nail removal and matrixecomty sites well-healed at this time -XR: as above, no evidence of complication -WB Status: Weightbearing as tolerated in regular shoes  # Small residual sliver of nail at the medial border of the right hallux nail ingrown -After prep with Betadine I proceeded to use a hemostat to grasp the small sliver of nail and remove it from the nail base.  I then applied a small amount of phenol to the area to prevent recurrence of this nail from growing. -Instructed to proceed with Epsom salt soaks as she had  done after prior ingrown removal.  Return if symptoms worsen or fail to improve.         Everitt Amber, DPM [facility]

## 2022-04-08 ENCOUNTER — Other Ambulatory Visit (INDEPENDENT_AMBULATORY_CARE_PROVIDER_SITE_OTHER): Payer: Medicare Other

## 2022-04-08 DIAGNOSIS — Z7901 Long term (current) use of anticoagulants: Secondary | ICD-10-CM | POA: Diagnosis not present

## 2022-04-08 LAB — POCT INR: INR: 2.4 (ref 2.0–3.0)

## 2022-04-22 ENCOUNTER — Other Ambulatory Visit (INDEPENDENT_AMBULATORY_CARE_PROVIDER_SITE_OTHER): Payer: No Typology Code available for payment source

## 2022-04-22 DIAGNOSIS — Z7901 Long term (current) use of anticoagulants: Secondary | ICD-10-CM

## 2022-04-22 LAB — POCT INR: INR: 2 (ref 2.0–3.0)

## 2022-04-29 ENCOUNTER — Other Ambulatory Visit: Payer: No Typology Code available for payment source

## 2022-04-29 DIAGNOSIS — Z7901 Long term (current) use of anticoagulants: Secondary | ICD-10-CM | POA: Diagnosis not present

## 2022-04-29 LAB — POCT INR: INR: 2.5 (ref 2.0–3.0)

## 2022-05-06 ENCOUNTER — Encounter: Payer: Self-pay | Admitting: Family Medicine

## 2022-05-06 MED ORDER — ALPRAZOLAM 0.5 MG PO TABS: 0.5000 mg | ORAL_TABLET | Freq: Three times a day (TID) | ORAL | 3 refills | Status: DC | PRN

## 2022-05-13 ENCOUNTER — Other Ambulatory Visit (INDEPENDENT_AMBULATORY_CARE_PROVIDER_SITE_OTHER): Payer: No Typology Code available for payment source

## 2022-05-13 DIAGNOSIS — Z7901 Long term (current) use of anticoagulants: Secondary | ICD-10-CM

## 2022-05-13 LAB — POCT INR: INR: 1.9 — AB (ref 2.0–3.0)

## 2022-05-15 DIAGNOSIS — R809 Proteinuria, unspecified: Secondary | ICD-10-CM | POA: Diagnosis not present

## 2022-05-20 ENCOUNTER — Other Ambulatory Visit (INDEPENDENT_AMBULATORY_CARE_PROVIDER_SITE_OTHER): Payer: No Typology Code available for payment source

## 2022-05-20 DIAGNOSIS — Z7901 Long term (current) use of anticoagulants: Secondary | ICD-10-CM

## 2022-05-20 LAB — POCT INR: INR: 2.1 (ref 2.0–3.0)

## 2022-05-27 ENCOUNTER — Other Ambulatory Visit (INDEPENDENT_AMBULATORY_CARE_PROVIDER_SITE_OTHER): Payer: No Typology Code available for payment source

## 2022-05-27 DIAGNOSIS — Z7901 Long term (current) use of anticoagulants: Secondary | ICD-10-CM

## 2022-05-27 LAB — POCT INR: INR: 2.2 (ref 2.0–3.0)

## 2022-06-10 ENCOUNTER — Other Ambulatory Visit: Payer: No Typology Code available for payment source

## 2022-06-12 ENCOUNTER — Ambulatory Visit (INDEPENDENT_AMBULATORY_CARE_PROVIDER_SITE_OTHER): Payer: No Typology Code available for payment source | Admitting: Family Medicine

## 2022-06-12 ENCOUNTER — Ambulatory Visit
Admission: RE | Admit: 2022-06-12 | Discharge: 2022-06-12 | Disposition: A | Discharge status: Home / Self Care | Payer: No Typology Code available for payment source | Source: Ambulatory Visit | Attending: Family Medicine | Admitting: Family Medicine

## 2022-06-12 ENCOUNTER — Encounter: Payer: Self-pay | Admitting: Family Medicine

## 2022-06-12 VITALS — BP 102/53 | HR 68 | Wt 155.0 lb

## 2022-06-12 DIAGNOSIS — Z7901 Long term (current) use of anticoagulants: Secondary | ICD-10-CM | POA: Diagnosis not present

## 2022-06-12 DIAGNOSIS — J301 Allergic rhinitis due to pollen: Secondary | ICD-10-CM | POA: Diagnosis not present

## 2022-06-12 DIAGNOSIS — M546 Pain in thoracic spine: Secondary | ICD-10-CM | POA: Diagnosis not present

## 2022-06-12 DIAGNOSIS — G629 Polyneuropathy, unspecified: Secondary | ICD-10-CM

## 2022-06-12 LAB — POCT INR: INR: 1.5 — AB (ref 2.0–3.0)

## 2022-06-12 MED ORDER — WARFARIN SODIUM 2 MG PO TABS: 6.0000 mg | ORAL_TABLET | Freq: Every day | ORAL | 3 refills | Status: DC

## 2022-06-12 MED ORDER — GABAPENTIN 100 MG PO CAPS: ORAL_CAPSULE | ORAL | 3 refills | Status: DC

## 2022-06-12 MED ORDER — AZELASTINE & FLUTICASONE 137 & 50 MCG/ACT NA THPK: PACK | NASAL | Status: DC

## 2022-06-12 MED ORDER — FLUCONAZOLE 100 MG PO TABS: 100.0000 mg | ORAL_TABLET | ORAL | 3 refills | Status: DC

## 2022-06-12 MED ORDER — AZELASTINE & FLUTICASONE 137 & 50 MCG/ACT NA THPK: PACK | NASAL | 12 refills | Status: DC

## 2022-06-12 NOTE — Patient Instructions (Signed)
I will contact you via MyChart after you get your X rays and we will likely set you up for physical therapy. I would recommend follow up for you in  about 6-8 weeks. Great to see you!

## 2022-06-14 NOTE — Progress Notes (Signed)
    CHIEF COMPLAINT / HPI:   #1.  Left-sided thoracic back pain if she stands for quite a while.  This has been going on several months, is just getting worse.  No specific injury.  It is aching in nature. 2.  Had all of her teeth removed in January and is still having some sinus type pain.  She is not sure if it is from the teeth or if the fact that her allergies are worse.  PERTINENT  PMH / PSH: I have reviewed the patient's medications, allergies, past medical and surgical history, smoking status and updated in the EMR as appropriate.   OBJECTIVE:  BP (!) 102/53   Pulse 68   Wt 155 lb (70.3 kg)   SpO2 100%   BMI 31.84 kg/m  GENERAL: Well-developed female no acute distress BACK: Slightly curved spine in her normal posture.  Mild muscular tenderness to palpation in the left mid thoracic area.  The vertebra are nontender to percussion. HEENT: Sinuses are nontender to percussion.  ASSESSMENT / PLAN: #1.  Thoracic back pain: I suspect this is functional related to postural issues and muscle imbalance.  Will get thoracic follow-up films to rule out anything else.  If those are normal I will schedule her with physical therapy on [address] 2.  Allergies are probably causing her continued symptoms although she may not have totally recovered from having all of her teeth removed.  Will start her on combination of azelastine and fluticasone nasal spray.  She will follow-up in 3 to 4 weeks let me know if this is better or not.  I will follow her up for her back issues in 6 to 8 weeks. 3.  She is also due for an annual wellness visit and is amenable to that.  No problem-specific Assessment & Plan notes found for this encounter.   Denny Levy MD

## 2022-06-17 ENCOUNTER — Other Ambulatory Visit (INDEPENDENT_AMBULATORY_CARE_PROVIDER_SITE_OTHER): Payer: No Typology Code available for payment source

## 2022-06-17 ENCOUNTER — Other Ambulatory Visit: Payer: Self-pay | Admitting: Family Medicine

## 2022-06-17 ENCOUNTER — Encounter: Payer: Self-pay | Admitting: Family Medicine

## 2022-06-17 DIAGNOSIS — R269 Unspecified abnormalities of gait and mobility: Secondary | ICD-10-CM

## 2022-06-17 DIAGNOSIS — M546 Pain in thoracic spine: Secondary | ICD-10-CM

## 2022-06-17 DIAGNOSIS — Z7901 Long term (current) use of anticoagulants: Secondary | ICD-10-CM | POA: Diagnosis not present

## 2022-06-17 LAB — POCT INR: INR: 1.9 — AB (ref 2.0–3.0)

## 2022-06-20 ENCOUNTER — Telehealth: Payer: Self-pay | Admitting: Family Medicine

## 2022-06-20 NOTE — Telephone Encounter (Signed)
Contacted [name] to schedule their annual wellness visit. Appointment made for 06/24/2022.  Thank you,  [facility] Support [facility] Direct dial  [phone]

## 2022-06-23 NOTE — Progress Notes (Unsigned)
I connected with  [name] on 06/24/2022 by a audio enabled telemedicine application and verified that I am speaking with the correct person using two identifiers.  Patient Location: Home  Provider Location: Home Office  I discussed the limitations of evaluation and management by telemedicine. The patient expressed understanding and agreed to proceed.   Subjective:   [name] is a 72 y.o. female who presents for Medicare Annual (Subsequent) preventive examination.  Review of Systems    Per HPI unless specifically indicated below.  Cardiac Risk Factors include: advanced age (>27men, >85 women);female gender,           Objective:       06/24/2022    9:46 AM 06/12/2022   10:16 AM 01/30/2022    9:54 AM  Vitals with BMI  Height   4' 10.5"  Weight  155 lbs 162 lbs 10 oz  BMI   33.4  Systolic 121 102 811  Diastolic 72 53 62  Pulse 81 68 82    Today's Vitals   06/24/22 0946 06/24/22 0953  BP: 121/72   Pulse: 81   PainSc:  4    There is no height or weight on file to calculate BMI.     06/24/2022    9:57 AM 06/12/2022   10:17 AM 01/30/2022    9:57 AM 01/02/2022    8:52 AM 12/05/2021   10:09 AM 08/10/2021    9:37 AM 03/05/2021   11:48 AM  Advanced Directives  Does Patient Have a Medical Advance Directive? Yes No No No No No No  Type of Estate agent of [place];Living will        Does patient want to make changes to medical advance directive? No - Patient declined        Copy of Healthcare Power of Attorney in Chart? No - copy requested        Would patient like information on creating a medical advance directive?   No - Patient declined No - Patient declined  No - Patient declined No - Patient declined    Current Medications (verified) Outpatient Encounter Medications as of 06/24/2022  Medication Sig   acetaminophen (TYLENOL) 325 MG tablet Take 975 mg by mouth every 6 (six) hours as needed for mild pain.   ALPRAZolam (XANAX) 0.5 MG  tablet Take 1 tablet (0.5 mg total) by mouth 3 (three) times daily as needed for anxiety.   benazepril (LOTENSIN) 20 MG tablet Take 1 tablet (20 mg total) by mouth daily.   diclofenac (VOLTAREN) 75 MG EC tablet TAKE 2 TABLETS BY MOUTH  ONCE DAILY AS NEEDED FOR  JOINT PAIN (Patient taking differently: 75 mg daily as needed.)   esomeprazole (NEXIUM) 20 MG capsule Take 2 capsules (40 mg total) by mouth daily.   famotidine (PEPCID) 20 MG tablet Take 20 mg by mouth at bedtime.   fluconazole (DIFLUCAN) 100 MG tablet Take 1 tablet (100 mg total) by mouth every Monday.   furosemide (LASIX) 20 MG tablet Take 2 tabs by mouth twice a day   gabapentin (NEURONTIN) 100 MG capsule TAKE 2 CAPSULES BY MOUTH 3 TIMES DAILY   venlafaxine XR (EFFEXOR-XR) 150 MG 24 hr capsule Take 1 capsule (150 mg total) by mouth daily.   venlafaxine XR (EFFEXOR-XR) 75 MG 24 hr capsule Take one by mouth daily   warfarin (COUMADIN) 2 MG tablet Take 3 tablets (6 mg total) by mouth daily.   Azelastine & Fluticasone 137 &  50 MCG/ACT THPK Spray each nostril once a day (Patient not taking: Reported on 06/24/2022)   No facility-administered encounter medications on file as of 06/24/2022.    Allergies (verified) Augmentin [amoxicillin-pot clavulanate], Vioxx [rofecoxib], Bactrim [sulfamethoxazole-trimethoprim], Banana, Cephalosporins, Ciprofloxacin, Coconut flavor, Codeine, and Zithromax [azithromycin]   History: Past Medical History:  Diagnosis Date   Anemia    take iron   Anxiety    Arthritis    Depression    Dysrhythmia 2016   occ due to caffeine intake   Encounter for long-term (current) use of other medications    GERD (gastroesophageal reflux disease)    Hepatitis    age 36   History of hiatal hernia    Hypertension    IBS (irritable bowel syndrome)    Obesity    Osteoarthritis    Pain in joint, multiple sites    Pneumonia 2010   PONV (postoperative nausea and vomiting)    in the 1980's no problems since   Psoriatic  arthropathy (HCC)    Pulmonary embolism (HCC) 12/03/2016   submassive/notes 12/03/2016   Raynaud disease    Past Surgical History:  Procedure Laterality Date   ABDOMINAL HYSTERECTOMY  03/1998   COLONOSCOPY     GASTRIC BYPASS  1978   HALLUX VALGUS CORRECTION  09/2010   Dr Everett Graff; right 1st ray   LAPAROSCOPIC CHOLECYSTECTOMY  04/2000   Hattie Perch 07/03/2010   LAPAROSCOPIC INCISIONAL / UMBILICAL / VENTRAL HERNIA REPAIR  09/2002   [facility]/notes 07/03/2010   REVISION TOTAL KNEE ARTHROPLASTY Left 10/2003   Hattie Perch 07/03/2010   REVISION TOTAL KNEE ARTHROPLASTY Left 02/2007   Hattie Perch 06/21/2010   SHOULDER HEMI-ARTHROPLASTY Right 06/28/2016   Procedure: RIGHT SHOULDER HEMI-ARTHROPLASTY;  Surgeon: Beverely Low, MD;  Location[facility];  Service: Orthopedics;  Laterality: Right;   TOTAL KNEE ARTHROPLASTY Left 10/1999   Hattie Perch 07/03/2010   TOTAL KNEE ARTHROPLASTY Right 03/05/2021   Procedure: TOTAL KNEE ARTHROPLASTY;  Surgeon: Ollen Gross, MD;  Location: [facility] ORS;  Service: Orthopedics;  Laterality: Right;   TUBAL LIGATION  1982   Family History  Problem Relation Age of Onset   Depression Mother    Obesity Father    Deep vein thrombosis Father    Arthritis Maternal Grandmother    Arthritis Maternal Grandfather    Arthritis Paternal Grandmother    Arthritis Paternal Grandfather    Breast cancer Neg Hx    Social History   Socioeconomic History   Marital status: Married    Spouse name: Simmie   Number of children: 1   Years of education: 16   Highest education level: Bachelor's degree (e.g., BA, AB, BS)  Occupational History   Occupation: Retired  Tobacco Use   Smoking status: Never   Smokeless tobacco: Never  Vaping Use   Vaping Use: Never used  Substance and Sexual Activity   Alcohol use: No   Drug use: No   Sexual activity: Yes  Other Topics Concern   Not on file  Social History Narrative   Patient lives with her husband in [place].    Patient has one daughter, two dogs, and  two  cats.    Patient is an avid reader and enjoys doing crafts.    Social Determinants of Health   Financial Resource Strain: Medium Risk (06/24/2022)   Overall Financial Resource Strain (CARDIA)    Difficulty of Paying Living Expenses: Somewhat hard  Food Insecurity: No Food Insecurity (06/24/2022)   Hunger Vital Sign    Worried About Running Out of  Food in the Last Year: Never true    Ran Out of Food in the Last Year: Never true  Transportation Needs: No Transportation Needs (06/24/2022)   PRAPARE - Administrator, Civil Service (Medical): No    Lack of Transportation (Non-Medical): No  Physical Activity: Insufficiently Active (06/24/2022)   Exercise Vital Sign    Days of Exercise per Week: 2 days    Minutes of Exercise per Session: 40 min  Stress: No Stress Concern Present (06/24/2022)   [employer] of Occupational Health - Occupational Stress Questionnaire    Feeling of Stress : Not at all  Social Connections: Moderately Isolated (06/24/2022)   Social Connection and Isolation Panel [NHANES]    Frequency of Communication with Friends and Family: More than three times a week    Frequency of Social Gatherings with Friends and Family: Once a week    Attends Religious Services: Never    Database administrator or Organizations: No    Attends Engineer, structural: Never    Marital Status: Married    Tobacco Counseling Counseling given: Not Answered   Clinical Intake:  Pre-visit preparation completed: No  Pain : 0-10 Pain Score: 4  Pain Type: Chronic pain Pain Location: Back Pain Descriptors / Indicators: Aching Pain Onset: More than a month ago Pain Frequency: Constant     Nutritional Status: BMI of 19-24  Normal Nutritional Risks: Unintentional weight loss Diabetes: No  How often do you need to have someone help you when you read instructions, pamphlets, or other written materials from your doctor or pharmacy?: 1 - Never  Diabetic?No  Interpreter  Needed?: No  Information entered by :: Laurel Dimmer, CMA   Activities of Daily Living    06/24/2022    9:51 AM  In your present state of health, do you have any difficulty performing the following activities:  Hearing? 1  Comment bilateral hearing aid  Vision? 1  Comment [facility]  Difficulty concentrating or making decisions? 0  Walking or climbing stairs? 1  Dressing or bathing? 0  Doing errands, shopping? 0    Patient Care Team: Nestor Ramp, MD as PCP - General  Indicate any recent Medical Services you may have received from other than [facility] providers in the past year (date may be approximate).     Assessment:   This is a routine wellness examination for [name].  Hearing/Vision screen Hearing loss, bilateral hearing aids  Denies any vision changes. Wear glasses. Annual Eye Exam. [facility]   Dietary issues and exercise activities discussed: Current Exercise Habits: Home exercise routine, Type of exercise: walking;Other - see comments, Time (Minutes): 40, Frequency (Times/Week): 2, Weekly Exercise (Minutes/Week): 80, Intensity: Moderate, Exercise limited by: None identified   Goals Addressed   None    Depression Screen    06/24/2022    9:50 AM 06/12/2022   10:22 AM 01/30/2022    9:59 AM 01/02/2022    8:53 AM 12/05/2021   10:08 AM 08/10/2021    9:36 AM 02/15/2021    9:11 AM  PHQ 2/9 Scores  PHQ - 2 Score 1 1 2 1 1 1 1   PHQ- 9 Score  3 3 3 5 1 2     Fall Risk    06/24/2022    9:50 AM 01/30/2022    9:57 AM 08/10/2021    9:36 AM 09/14/2020   11:23 AM 04/19/2019    3:49 PM  Fall Risk   Falls in the past  year? 1 0 0 1 1  Number falls in past yr: 0  0 1 1  Injury with Fall? 1  0 1 1  Risk for fall due to : Other (Comment)   Impaired balance/gait Impaired balance/gait;Impaired mobility  Risk for fall due to: Comment tripped over something in the yard      Follow up Falls evaluation completed   Falls prevention discussed Falls prevention discussed     FALL RISK PREVENTION PERTAINING TO THE HOME:  Any stairs in or around the home? Yes  If so, are there any without handrails? No  Home free of loose throw rugs in walkways, pet beds, electrical cords, etc? Yes  Adequate lighting in your home to reduce risk of falls? Yes   ASSISTIVE DEVICES UTILIZED TO PREVENT FALLS:  Life alert? No  Use of a cane, walker or w/c? No  Grab bars in the bathroom? No  Shower chair or bench in shower? No  Elevated toilet seat or a handicapped toilet? No   TIMED UP AND GO:  Was the test performed?Unable to perform, virtual appointment   Cognitive Function:        06/24/2022    9:55 AM 09/14/2020   11:24 AM  6CIT Screen  What Year? 0 points 0 points  What month? 0 points 0 points  What time? 0 points 0 points  Count back from 20 0 points 0 points  Months in reverse 0 points 0 points  Repeat phrase 0 points 0 points  Total Score 0 points 0 points    Immunizations Immunization History  Administered Date(s) Administered   COVID-19, mRNA, vaccine(Comirnaty)12 years and older 01/30/2022   Fluad Quad(high Dose 65+) 10/24/2018, 11/08/2020   Influenza Whole 10/20/2007, 11/22/2011   Influenza,inj,Quad PF,6+ Mos 02/22/2015, 11/13/2015, 10/23/2016, 10/28/2017, 12/05/2021   Influenza-Unspecified 10/31/2018, 12/01/2019   PFIZER(Purple Top)SARS-COV-2 Vaccination 04/22/2019, 05/18/2019, 01/07/2020   Pfizer Covid-19 Vaccine Bivalent Booster 47yrs & up 12/19/2020   Pneumococcal Conjugate-13 10/28/2017   Pneumococcal Polysaccharide-23 08/18/2019   Td 04/18/2005   Tdap 02/16/2016   Zoster, Live 09/08/2013    TDAP status: Up to date  Flu Vaccine status: Up to date  Pneumococcal vaccine status: Up to date  Covid-19 vaccine status: Completed vaccines  Qualifies for Shingles Vaccine? Yes   Zostavax completed No   Shingrix Completed?: No.    Education has been provided regarding the importance of this vaccine. Patient has been advised to call  insurance company to determine out of pocket expense if they have not yet received this vaccine. Advised may also receive vaccine at local pharmacy or Health Dept. Verbalized acceptance and understanding.  Screening Tests Health Maintenance  Topic Date Due   Zoster Vaccines- Shingrix (1 of 2) Never done   INFLUENZA VACCINE  09/19/2022   Medicare Annual Wellness (AWV)  06/24/2023   MAMMOGRAM  09/28/2023   DTaP/Tdap/Td (3 - Td or Tdap) 02/15/2026   COLONOSCOPY (Pts 45-82yrs Insurance coverage will need to be confirmed)  07/25/2026   Pneumonia Vaccine 57+ Years old  Completed   DEXA SCAN  Completed   COVID-19 Vaccine  Completed   Hepatitis C Screening  Completed   HPV VACCINES  Aged Out    Health Maintenance  Health Maintenance Due  Topic Date Due   Zoster Vaccines- Shingrix (1 of 2) Never done    Colorectal cancer screening: Type of screening: Colonoscopy. Completed 07/24/2016. Repeat every 10 years  Mammogram status: Completed 09/27/2021. Repeat every year  DEXA Scan: 11/11/2019  Lung  Cancer Screening: (Low Dose CT Chest recommended if Age 85-80 years, 30 pack-year currently smoking OR have quit w/in 15years.) does not qualify.   Lung Cancer Screening Referral: not applicable   Additional Screening:  Hepatitis C Screening: does qualify; Completed 02/18/2018  Vision Screening: Recommended annual ophthalmology exams for early detection of glaucoma and other disorders of the eye. Is the patient up to date with their annual eye exam?  Yes  Who is the provider or what is the name of the office in which the patient attends annual eye exams? [facility]  If pt is not established with a provider, would they like to be referred to a provider to establish care? No .   Dental Screening: Recommended annual dental exams for proper oral hygiene  Community Resource Referral / Chronic Care Management: CRR required this visit?  No   CCM required this visit?  No      Plan:      I have personally reviewed and noted the following in the patient's chart:   Medical and social history Use of alcohol, tobacco or illicit drugs  Current medications and supplements including opioid prescriptions. Patient is not currently taking opioid prescriptions. Functional ability and status Nutritional status Physical activity Advanced directives List of other physicians Hospitalizations, surgeries, and ER visits in previous 12 months Vitals Screenings to include cognitive, depression, and falls Referrals and appointments  In addition, I have reviewed and discussed with patient certain preventive protocols, quality metrics, and best practice recommendations. A written personalized care plan for preventive services as well as general preventive health recommendations were provided to patient.    [name] , Thank you for taking time to come for your Medicare Wellness Visit. I appreciate your ongoing commitment to your health goals. Please review the following plan we discussed and let me know if I can assist you in the future.   These are the goals we discussed:  Goals      DIET - INCREASE WATER INTAKE        This is a list of the screening recommended for you and due dates:  Health Maintenance  Topic Date Due   Zoster (Shingles) Vaccine (1 of 2) Never done   Flu Shot  09/19/2022   Medicare Annual Wellness Visit  06/24/2023   Mammogram  09/28/2023   DTaP/Tdap/Td vaccine (3 - Td or Tdap) 02/15/2026   Colon Cancer Screening  07/25/2026   Pneumonia Vaccine  Completed   DEXA scan (bone density measurement)  Completed   COVID-19 Vaccine  Completed   Hepatitis C Screening: USPSTF Recommendation to screen - Ages 27-79 yo.  Completed   HPV Vaccine  Aged [address], [place]   06/24/2022  Nurse Notes: Approximately 30 minute Non-Face -To-Face Medicare Wellness Visit

## 2022-06-23 NOTE — Patient Instructions (Signed)

## 2022-06-24 ENCOUNTER — Ambulatory Visit (INDEPENDENT_AMBULATORY_CARE_PROVIDER_SITE_OTHER): Payer: No Typology Code available for payment source

## 2022-06-24 ENCOUNTER — Other Ambulatory Visit (INDEPENDENT_AMBULATORY_CARE_PROVIDER_SITE_OTHER): Payer: No Typology Code available for payment source

## 2022-06-24 ENCOUNTER — Telehealth: Payer: Self-pay

## 2022-06-24 VITALS — BP 121/72 | HR 81

## 2022-06-24 DIAGNOSIS — Z7901 Long term (current) use of anticoagulants: Secondary | ICD-10-CM | POA: Diagnosis not present

## 2022-06-24 DIAGNOSIS — Z0000 Encounter for general adult medical examination without abnormal findings: Secondary | ICD-10-CM

## 2022-06-24 LAB — POCT INR: INR: 1.5 — AB (ref 2.0–3.0)

## 2022-06-24 NOTE — Telephone Encounter (Signed)
Open in error

## 2022-06-25 ENCOUNTER — Telehealth: Payer: Self-pay

## 2022-06-25 NOTE — Telephone Encounter (Signed)
Rec'd fax from patients insurance.   Insurance was able to cover a temporary month supply of the Azelastine & Fluticasone 137 & 50 MCG/ACT nasal spray for the patient, however this is a non formulary medication.   A PA may be required in the future, or an alternative medication may need to be sent. Pt rec'd a copy of this letter as well.

## 2022-07-01 ENCOUNTER — Other Ambulatory Visit (INDEPENDENT_AMBULATORY_CARE_PROVIDER_SITE_OTHER): Payer: No Typology Code available for payment source

## 2022-07-01 DIAGNOSIS — Z7901 Long term (current) use of anticoagulants: Secondary | ICD-10-CM | POA: Diagnosis not present

## 2022-07-01 LAB — POCT INR: INR: 2.2 (ref 2.0–3.0)

## 2022-07-08 ENCOUNTER — Other Ambulatory Visit (INDEPENDENT_AMBULATORY_CARE_PROVIDER_SITE_OTHER): Payer: No Typology Code available for payment source

## 2022-07-08 DIAGNOSIS — Z7901 Long term (current) use of anticoagulants: Secondary | ICD-10-CM | POA: Diagnosis not present

## 2022-07-08 LAB — POCT INR: INR: 1.9 — AB (ref 2.0–3.0)

## 2022-07-08 NOTE — Therapy (Signed)
OUTPATIENT PHYSICAL THERAPY EVALUATION   Patient Name: [name] MRN: [id number] DOB:1950-03-08, 72 y.o., female Today's Date: 07/09/2022   END OF SESSION:  PT End of Session - 07/09/22 0846     Visit Number 1    Number of Visits 9    Date for PT Re-Evaluation 09/03/22    Authorization Type [employer]    PT Start Time 0845    PT Stop Time 0930    PT Time Calculation (min) 45 min    Activity Tolerance Patient tolerated treatment well    Behavior During Therapy WFL for tasks assessed/performed             Past Medical History:  Diagnosis Date   Anemia    take iron   Anxiety    Arthritis    Depression    Dysrhythmia 2016   occ due to caffeine intake   Encounter for long-term (current) use of other medications    GERD (gastroesophageal reflux disease)    Hepatitis    age 54   History of hiatal hernia    Hypertension    IBS (irritable bowel syndrome)    Obesity    Osteoarthritis    Pain in joint, multiple sites    Pneumonia 2010   PONV (postoperative nausea and vomiting)    in the 1980's no problems since   Psoriatic arthropathy (HCC)    Pulmonary embolism (HCC) 12/03/2016   submassive/notes 12/03/2016   Raynaud disease    Past Surgical History:  Procedure Laterality Date   ABDOMINAL HYSTERECTOMY  03/1998   COLONOSCOPY     GASTRIC BYPASS  1978   HALLUX VALGUS CORRECTION  09/2010   Dr Everett Graff; right 1st ray   LAPAROSCOPIC CHOLECYSTECTOMY  04/2000   Hattie Perch 07/03/2010   LAPAROSCOPIC INCISIONAL / UMBILICAL / VENTRAL HERNIA REPAIR  09/2002   VHR/notes 07/03/2010   REVISION TOTAL KNEE ARTHROPLASTY Left 10/2003   Hattie Perch 07/03/2010   REVISION TOTAL KNEE ARTHROPLASTY Left 02/2007   Hattie Perch 06/21/2010   SHOULDER HEMI-ARTHROPLASTY Right 06/28/2016   Procedure: RIGHT SHOULDER HEMI-ARTHROPLASTY;  Surgeon: Beverely Low, MD;  Location: [facility] OR;  Service: Orthopedics;  Laterality: Right;   TOTAL KNEE ARTHROPLASTY Left 10/1999   Hattie Perch 07/03/2010   TOTAL KNEE  ARTHROPLASTY Right 03/05/2021   Procedure: TOTAL KNEE ARTHROPLASTY;  Surgeon: Ollen Gross, MD;  Location: [facility] ORS;  Service: Orthopedics;  Laterality: Right;   TUBAL LIGATION  1982   Patient Active Problem List   Diagnosis Date Noted   Nausea and vomiting 12/05/2021   Other fatigue 12/05/2021   OA (osteoarthritis) of knee 03/05/2021   Primary osteoarthritis of right knee 03/05/2021   Neuropathy 11/09/2020   Metatarsalgia of both feet 08/30/2018   Gait abnormality 08/21/2018   Chronic anticoagulation 06/24/2017   Collagenous colitis 05/07/2017   Hx vasculitis leukocytoclastic 2018 05/07/2017   GAD (generalized anxiety disorder)    S/P shoulder replacement, right 06/28/2016   Vitamin D deficiency 12/05/2015   Vitamin B 12 deficiency 12/05/2015   AC (acromioclavicular) joint arthritis 08/29/2015   Well adult health check 09/01/2013   Hammertoe 11/20/2012   Psoriatic arthritis (HCC) 04/10/2012   Pulmonary nodule seen on imaging study 11/08/2011   Dysthymia 12/12/2010   Essential hypertension 04/27/2008   Enthesopathy of ankle and tarsus 10/02/2006   KNEE REPLACEMENT, LEFT, HX OF 07/03/2006   OBESITY, NOS 04/17/2006   PANIC ATTACKS 04/17/2006   GASTROESOPHAGEAL REFLUX, NO ESOPHAGITIS 04/17/2006   VAGINITIS, ATROPHIC 04/17/2006    PCP: Denny Levy  Elbert Ewings, MD  REFERRING PROVIDER: Nestor Ramp, MD  REFERRING DIAG: Left-sided thoracic back pain, unspecified chronicity  Rationale for Evaluation and Treatment: Rehabilitation  THERAPY DIAG:  Pain in thoracic spine  Other low back pain  Muscle weakness (generalized)  ONSET DATE: 6-7 months   SUBJECTIVE:                                                                                                                                                                                          SUBJECTIVE STATEMENT: Patient reports she has levoscoliosis. If she has been shopping for a while, she will start to have some lower back achiness,  and then if she has been mowing the yard or sweeping the floor she will have most of her pain on the left upper back region that feels like a burning. Pain will ease off if she stops what she is doing, and then will go away over night so she wakes up with minimal pain. She reports inability to get up from ground with recent fall when mowing grass and she would like to be able to take baths.   PERTINENT HISTORY:  Right TSA, see other PMH above  PAIN:  Are you having pain? Yes:  NPRS scale: 4.5/10 (8/10 at worst with activity) Pain location: Upper - lower back Pain description: Burning, aching Aggravating factors: Shopping, sweeping, mowing grass Relieving factors: Rest  PRECAUTIONS: None  WEIGHT BEARING RESTRICTIONS: No  FALLS:  Has patient fallen in last 6 months? Yes. Number of falls 1, tripped when mowing grass  PLOF: Independent  PATIENT GOALS: Pain relief with activity   OBJECTIVE:  DIAGNOSTIC FINDINGS:  X-ray 06/12/2022: IMPRESSION: Mild-to-moderate multilevel degenerative disc disease throughout the thoracic spine. Mild long segment levocurvature of the thoracic spine. No acute osseous abnormality.  PATIENT SURVEYS:  FOTO 47% functional status  SCREENING FOR RED FLAGS: Negative  COGNITION: Overall cognitive status: Within functional limits for tasks assessed     SENSATION: WFL  MUSCLE LENGTH: Limitations bilateral hamstring and quad flexibility  POSTURE:   Increased thoracic kyphosis, forward head  PALPATION: Mildly tender to palpation left thoracic and upper trap region  LUMBAR ROM:   AROM eval  Flexion WFL  Extension WFL  Right lateral flexion WFL  Left lateral flexion WFL  Right rotation 75%  Left rotation WFL   (Blank rows = not tested)  LOWER EXTREMITY MMT:    MMT Right eval Left eval  Hip flexion 4 4  Hip extension 4- 4-  Hip abduction 4- 4-  Hip adduction    Hip internal rotation    Hip external rotation    Knee  flexion 5 5   Knee extension 5 5  Ankle dorsiflexion    Ankle plantarflexion    Ankle inversion    Ankle eversion     (Blank rows = not tested)  UPPER EXTREMITY MMT:  MMT Right eval Left eval  Shoulder flexion  4  Shoulder extension  4  Shoulder abduction  4  Shoulder adduction    Shoulder internal rotation  5  Shoulder external rotation  4  Middle trapezius  4-  Lower trapezius  4-  Elbow flexion    Elbow extension    Wrist flexion    Wrist extension    Wrist ulnar deviation    Wrist radial deviation    Wrist pronation    Wrist supination    Grip strength     (Blank rows = not tested)   FUNCTIONAL TESTS:  5 times sit to stand: 14 seconds  GAIT: Assistive device utilized: None Level of assistance: Complete Independence   TODAY'S TREATMENT:   [facility] Adult PT Treatment:                                                DATE: 07/09/2022 Therapeutic Exercise: Clamshell with yellow x 10 each Bridge with yellow x 10 Row with red x 10  PATIENT EDUCATION:  Education details: Exam findings, POC, HEP Person educated: Patient Education method: Explanation Education comprehension: verbalized understanding, returned demonstration, verbal cues required, tactile cues required, and needs further education  HOME EXERCISE PROGRAM: Access Code: [email or web address]    ASSESSMENT: CLINICAL IMPRESSION: Patient is a 72 y.o. female who was seen today for physical therapy evaluation and treatment for chronic upper and lower back pain that has been worsening over the past 6-7 months. She demonstrates significant postural deviations with poor postural and hip strength deficits that are likely a primary reason for her activity intolerance and pain.  OBJECTIVE IMPAIRMENTS: decreased activity tolerance, decreased ROM, decreased strength, impaired flexibility, postural dysfunction, and pain.   ACTIVITY LIMITATIONS: carrying, lifting, standing, bathing, and locomotion level  PARTICIPATION LIMITATIONS: meal prep,  cleaning, shopping, community activity, and yard work  PERSONAL FACTORS: Fitness, Past/current experiences, and Time since onset of injury/illness/exacerbation are also affecting patient's functional outcome.   REHAB POTENTIAL: Good  CLINICAL DECISION MAKING: Stable/uncomplicated  EVALUATION COMPLEXITY: Low   GOALS: Goals reviewed with patient? Yes  SHORT TERM GOALS: Target date: 08/06/2022  Patient will be I with initial HEP in order to progress with therapy. Baseline: HEP provided at eval Goal status: INITIAL  2.  Patient will report upper and lower back pain </= 4/10 with activity in order to reduce functional limitations Baseline: 8/10 pain with activity Goal status: INITIAL  3.  Patient will demonstrate 5xSTS </= 9 seconds to indicate reduced fall risk Baseline: 14 seconds Goal status: INITIAL  LONG TERM GOALS: Target date: 09/03/2022  Patient will be I with final HEP to maintain progress from PT. Baseline: HEP provided at eval Goal status: INITIAL  2.  Patient will report >/= 56% status on FOTO to indicate improved functional ability. Baseline: 47% functional status Goal status: INITIAL  3.  Patient will demonstrate periscapular and hip strength >/= 4/5 MMT in order to improve postural control and tolerance for standing and household activitiess Baseline: grossly 4-/5 MMT Goal status: INITIAL  4.  Patient will be able to perform floor-chair transfer and will report  ability to get in and out of her bathtub at home in order to improve functional ability Baseline: patient unable to get up from floor or out of bath tub Goal status: INITIAL   PLAN: PT FREQUENCY: 1x/week  PT DURATION: 8 weeks  PLANNED INTERVENTIONS: Therapeutic exercises, Therapeutic activity, Neuromuscular re-education, Balance training, Gait training, Patient/Family education, Self Care, Joint mobilization, Aquatic Therapy, Dry Needling, Cryotherapy, Moist heat, Manual therapy, and  Re-evaluation.  PLAN FOR NEXT SESSION: Review HEP and progress PRN, focus on postural and hip/core strengthening with progression to upright exercises, hamstring and quad strengthening, work on floor transfers and methods for getting in/out of tub   Rosana Hoes, PT, DPT, LAT, ATC 07/09/22  10:28 AM Phone: [phone] Fax: [phone]

## 2022-07-09 ENCOUNTER — Ambulatory Visit: Payer: No Typology Code available for payment source | Attending: Family Medicine | Admitting: Physical Therapy

## 2022-07-09 ENCOUNTER — Other Ambulatory Visit: Payer: Self-pay

## 2022-07-09 ENCOUNTER — Encounter: Payer: Self-pay | Admitting: Physical Therapy

## 2022-07-09 DIAGNOSIS — R269 Unspecified abnormalities of gait and mobility: Secondary | ICD-10-CM | POA: Diagnosis not present

## 2022-07-09 DIAGNOSIS — M6281 Muscle weakness (generalized): Secondary | ICD-10-CM | POA: Diagnosis not present

## 2022-07-09 DIAGNOSIS — M5459 Other low back pain: Secondary | ICD-10-CM | POA: Diagnosis not present

## 2022-07-09 DIAGNOSIS — M546 Pain in thoracic spine: Secondary | ICD-10-CM | POA: Insufficient documentation

## 2022-07-09 NOTE — Patient Instructions (Signed)
Access Code: [email or web address] URL: [email or web address] Date: 07/09/2022 Prepared by: Rosana Hoes  Exercises - Clam with Resistance  - 1 x daily - 3 sets - 10 reps - Supine Bridge with Resistance Band  - 1 x daily - 3 sets - 10 reps - Standing Row with Anchored Resistance  - 1 x daily - 3 sets - 10 reps

## 2022-07-16 ENCOUNTER — Other Ambulatory Visit (INDEPENDENT_AMBULATORY_CARE_PROVIDER_SITE_OTHER): Payer: No Typology Code available for payment source

## 2022-07-16 DIAGNOSIS — Z7901 Long term (current) use of anticoagulants: Secondary | ICD-10-CM

## 2022-07-16 LAB — POCT INR: INR: 2.3 (ref 2.0–3.0)

## 2022-07-18 ENCOUNTER — Telehealth: Payer: Self-pay | Admitting: Physical Therapy

## 2022-07-18 NOTE — Telephone Encounter (Signed)
Called and LVM, offering opening with [facility] Friday 8:45am

## 2022-07-23 ENCOUNTER — Ambulatory Visit: Payer: No Typology Code available for payment source | Admitting: Physical Therapy

## 2022-07-24 ENCOUNTER — Encounter: Payer: Self-pay | Admitting: Family Medicine

## 2022-07-24 ENCOUNTER — Ambulatory Visit (INDEPENDENT_AMBULATORY_CARE_PROVIDER_SITE_OTHER): Payer: No Typology Code available for payment source | Admitting: Family Medicine

## 2022-07-24 VITALS — BP 100/60 | HR 64 | Ht 58.5 in | Wt 152.0 lb

## 2022-07-24 DIAGNOSIS — H6992 Unspecified Eustachian tube disorder, left ear: Secondary | ICD-10-CM | POA: Diagnosis not present

## 2022-07-24 DIAGNOSIS — G8929 Other chronic pain: Secondary | ICD-10-CM | POA: Diagnosis not present

## 2022-07-24 DIAGNOSIS — M549 Dorsalgia, unspecified: Secondary | ICD-10-CM | POA: Diagnosis not present

## 2022-07-24 DIAGNOSIS — M5489 Other dorsalgia: Secondary | ICD-10-CM

## 2022-07-24 DIAGNOSIS — Z7901 Long term (current) use of anticoagulants: Secondary | ICD-10-CM | POA: Diagnosis not present

## 2022-07-24 DIAGNOSIS — F410 Panic disorder [episodic paroxysmal anxiety] without agoraphobia: Secondary | ICD-10-CM

## 2022-07-24 LAB — POCT INR: INR: 2.7 (ref 2.0–3.0)

## 2022-07-24 NOTE — Progress Notes (Signed)
    CHIEF COMPLAINT / HPI: Follow-up back pain: Just started PT and has had 1 session.  Hopeful this will help. #2.  Left ear discomfort for about the last 2 weeks.  Things sound a little different in there but it is hard to tell because she uses hearing aids.  It feels a little uncomfortable.  No discharge. #3.  Follow-up anticoagulation.  Due to insurance reasons, she is continues on warfarin.  No episodes of bleeding.  Has had some difficulty regulating her INR and has to get it checked quite frequently.  She would really like to go back on DOAC. 4.  Mood disorder with anxiety: Currently stable although she has had a lot of additional stressors, worrying about her husband's health and worrying about some other family members.  PERTINENT  PMH / PSH: I have reviewed the patient's medications, allergies, past medical and surgical history, smoking status and updated in the EMR as appropriate.   OBJECTIVE:  BP 100/60   Pulse 64   Ht 4' 10.5" (1.486 m)   Wt 152 lb (68.9 kg)   SpO2 97%   BMI 31.23 kg/m  Vital signs reviewed. GENERAL: Well-developed, well-nourished, no acute distress. PSYCH: AxOx4. Good eye contact.. No psychomotor retardation or agitation. Appropriate speech fluency and content. Asks and answers questions appropriately. Mood is congruent.  MSK: Movement of extremity x 4.   ASSESSMENT / PLAN:  1.  Back pain: Low back pain but I think it is related to having issues with the thoracic area.  She has started PT but is only had 1 session.  She will follow-up in a couple of months after she has had some more physical therapy.  If she has increasing symptoms, she will come in sooner. 2.  Left ear discomfort: Eustachian tube dysfunction most likely although she has some chronic appearing tympanic membrane scarring which may be adding to her issues.  Restart Flonase.  Maybe add over-the-counter Sudafed. Chronic anticoagulation Continue monitoring INR as we are doing.  Unclear why  she has such lability.  She is following an appropriate diet, not taking intermittent medications.  It would be nice at some point if we can get her back on DOAC but related to financial and insurance issues, does not seem like it is going to be anytime soon.  No episodes of bleeding.  PANIC ATTACKS Is having some increasing stressors but right now she is maintaining stability.  Will continue her current medications unchanged.  Follow-up 3 months.   Denny Levy MD

## 2022-07-24 NOTE — Assessment & Plan Note (Signed)
Continue monitoring INR as we are doing.  Unclear why she has such lability.  She is following an appropriate diet, not taking intermittent medications.  It would be nice at some point if we can get her back on DOAC but related to financial and insurance issues, does not seem like it is going to be anytime soon.  No episodes of bleeding.

## 2022-07-24 NOTE — Assessment & Plan Note (Signed)
Is having some increasing stressors but right now she is maintaining stability.  Will continue her current medications unchanged.  Follow-up 3 months.

## 2022-07-26 NOTE — Therapy (Signed)
OUTPATIENT PHYSICAL THERAPY TREATMENT NOTE   Patient Name: [name] MRN: [id number] DOB:1950/05/14, 72 y.o., female Today's Date: 07/26/2022  PCP: Nestor Ramp, MD REFERRING PROVIDER: Nestor Ramp, MD   END OF SESSION:    Past Medical History:  Diagnosis Date   Anemia    take iron   Anxiety    Arthritis    Depression    Dysrhythmia 2016   occ due to caffeine intake   Encounter for long-term (current) use of other medications    GERD (gastroesophageal reflux disease)    Hepatitis    age 39   History of hiatal hernia    Hypertension    IBS (irritable bowel syndrome)    Obesity    Osteoarthritis    Pain in joint, multiple sites    Pneumonia 2010   PONV (postoperative nausea and vomiting)    in the 1980's no problems since   Psoriatic arthropathy (HCC)    Pulmonary embolism (HCC) 12/03/2016   submassive/notes 12/03/2016   Raynaud disease    Past Surgical History:  Procedure Laterality Date   ABDOMINAL HYSTERECTOMY  03/1998   COLONOSCOPY     GASTRIC BYPASS  1978   HALLUX VALGUS CORRECTION  09/2010   Dr Everett Graff; right 1st ray   LAPAROSCOPIC CHOLECYSTECTOMY  04/2000   Hattie Perch 07/03/2010   LAPAROSCOPIC INCISIONAL / UMBILICAL / VENTRAL HERNIA REPAIR  09/2002   VHR/notes 07/03/2010   REVISION TOTAL KNEE ARTHROPLASTY Left 10/2003   Hattie Perch 07/03/2010   REVISION TOTAL KNEE ARTHROPLASTY Left 02/2007   Hattie Perch 06/21/2010   SHOULDER HEMI-ARTHROPLASTY Right 06/28/2016   Procedure: RIGHT SHOULDER HEMI-ARTHROPLASTY;  Surgeon: Beverely Low, MD;  Location: [facility] OR;  Service: Orthopedics;  Laterality: Right;   TOTAL KNEE ARTHROPLASTY Left 10/1999   Hattie Perch 07/03/2010   TOTAL KNEE ARTHROPLASTY Right 03/05/2021   Procedure: TOTAL KNEE ARTHROPLASTY;  Surgeon: Ollen Gross, MD;  Location: [facility] ORS;  Service: Orthopedics;  Laterality: Right;   TUBAL LIGATION  1982   Patient Active Problem List   Diagnosis Date Noted   Nausea and vomiting 12/05/2021   Other fatigue 12/05/2021   OA  (osteoarthritis) of knee 03/05/2021   Primary osteoarthritis of right knee 03/05/2021   Neuropathy 11/09/2020   Metatarsalgia of both feet 08/30/2018   Gait abnormality 08/21/2018   Chronic anticoagulation 06/24/2017   Collagenous colitis 05/07/2017   Hx vasculitis leukocytoclastic 2018 05/07/2017   GAD (generalized anxiety disorder)    S/P shoulder replacement, right 06/28/2016   Vitamin D deficiency 12/05/2015   Vitamin B 12 deficiency 12/05/2015   AC (acromioclavicular) joint arthritis 08/29/2015   Well adult health check 09/01/2013   Hammertoe 11/20/2012   Psoriatic arthritis (HCC) 04/10/2012   Pulmonary nodule seen on imaging study 11/08/2011   Dysthymia 12/12/2010   Essential hypertension 04/27/2008   Enthesopathy of ankle and tarsus 10/02/2006   KNEE REPLACEMENT, LEFT, HX OF 07/03/2006   OBESITY, NOS 04/17/2006   PANIC ATTACKS 04/17/2006   GASTROESOPHAGEAL REFLUX, NO ESOPHAGITIS 04/17/2006   VAGINITIS, ATROPHIC 04/17/2006    REFERRING DIAG: Left-sided thoracic back pain, unspecified chronicity   THERAPY DIAG:  No diagnosis found.  Rationale for Evaluation and Treatment Rehabilitation  PERTINENT HISTORY: Right TSA, see other PMH above   PRECAUTIONS: None    SUBJECTIVE:  SUBJECTIVE STATEMENT:  ***  PAIN:  Are you having pain? Yes:  NPRS scale: 4.5/10 (8/10 at worst with activity) Pain location: Upper - lower back Pain description: Burning, aching Aggravating factors: Shopping, sweeping, mowing grass Relieving factors: Rest   OBJECTIVE: (objective measures completed at initial evaluation unless otherwise dated) PATIENT SURVEYS:  FOTO 47% functional status   MUSCLE LENGTH: Limitations bilateral hamstring and quad flexibility   POSTURE:             Increased thoracic kyphosis,  forward head   PALPATION: Mildly tender to palpation left thoracic and upper trap region   LUMBAR ROM:    AROM eval  Flexion WFL  Extension WFL  Right lateral flexion WFL  Left lateral flexion WFL  Right rotation 75%  Left rotation WFL   (Blank rows = not tested)   LOWER EXTREMITY MMT:     MMT Right eval Left eval  Hip flexion 4 4  Hip extension 4- 4-  Hip abduction 4- 4-  Hip adduction      Hip internal rotation      Hip external rotation      Knee flexion 5 5  Knee extension 5 5  Ankle dorsiflexion      Ankle plantarflexion      Ankle inversion      Ankle eversion       (Blank rows = not tested)   UPPER EXTREMITY MMT:   MMT Right eval Left eval  Shoulder flexion   4  Shoulder extension   4  Shoulder abduction   4  Shoulder adduction      Shoulder internal rotation   5  Shoulder external rotation   4  Middle trapezius   4-  Lower trapezius   4-  Elbow flexion      Elbow extension      Wrist flexion      Wrist extension      Wrist ulnar deviation      Wrist radial deviation      Wrist pronation      Wrist supination      Grip strength       (Blank rows = not tested)    FUNCTIONAL TESTS:  5 times sit to stand: 14 seconds   GAIT: Assistive device utilized: None Level of assistance: Complete Independence     TODAY'S TREATMENT:   [facility] Adult PT Treatment:                                                DATE: 07/29/2022 Therapeutic Exercise: Clamshell with yellow x 10 each Bridge with yellow x 10 Row with red x 10   [facility] Adult PT Treatment:                                                DATE: 07/09/2022 Therapeutic Exercise: Clamshell with yellow x 10 each Bridge with yellow x 10 Row with red x 10   PATIENT EDUCATION:  Education details: HEP Person educated: Patient Education method: Explanation Education comprehension: verbalized understanding, returned demonstration, verbal cues required, tactile cues required, and needs further  education   HOME EXERCISE PROGRAM: Access Code: [email or web address]      ASSESSMENT: CLINICAL IMPRESSION: Patient  tolerated therapy well with no adverse effects. *** Patient would benefit from continued skilled PT to progress her mobility and strength in order to reduce pain and maximize functional ability.  Patient is a 72 y.o. female who was seen today for physical therapy evaluation and treatment for chronic upper and lower back pain that has been worsening over the past 6-7 months. She demonstrates significant postural deviations with poor postural and hip strength deficits that are likely a primary reason for her activity intolerance and pain.   OBJECTIVE IMPAIRMENTS: decreased activity tolerance, decreased ROM, decreased strength, impaired flexibility, postural dysfunction, and pain.    ACTIVITY LIMITATIONS: carrying, lifting, standing, bathing, and locomotion level   PARTICIPATION LIMITATIONS: meal prep, cleaning, shopping, community activity, and yard work   PERSONAL FACTORS: Fitness, Past/current experiences, and Time since onset of injury/illness/exacerbation are also affecting patient's functional outcome.     GOALS: Goals reviewed with patient? Yes   SHORT TERM GOALS: Target date: 08/06/2022   Patient will be I with initial HEP in order to progress with therapy. Baseline: HEP provided at eval Goal status: INITIAL   2.  Patient will report upper and lower back pain </= 4/10 with activity in order to reduce functional limitations Baseline: 8/10 pain with activity Goal status: INITIAL   3.  Patient will demonstrate 5xSTS </= 9 seconds to indicate reduced fall risk Baseline: 14 seconds Goal status: INITIAL   LONG TERM GOALS: Target date: 09/03/2022   Patient will be I with final HEP to maintain progress from PT. Baseline: HEP provided at eval Goal status: INITIAL   2.  Patient will report >/= 56% status on FOTO to indicate improved functional ability. Baseline: 47% functional  status Goal status: INITIAL   3.  Patient will demonstrate periscapular and hip strength >/= 4/5 MMT in order to improve postural control and tolerance for standing and household activitiess Baseline: grossly 4-/5 MMT Goal status: INITIAL   4.  Patient will be able to perform floor-chair transfer and will report ability to get in and out of her bathtub at home in order to improve functional ability Baseline: patient unable to get up from floor or out of bath tub Goal status: INITIAL     PLAN: PT FREQUENCY: 1x/week   PT DURATION: 8 weeks   PLANNED INTERVENTIONS: Therapeutic exercises, Therapeutic activity, Neuromuscular re-education, Balance training, Gait training, Patient/Family education, Self Care, Joint mobilization, Aquatic Therapy, Dry Needling, Cryotherapy, Moist heat, Manual therapy, and Re-evaluation.   PLAN FOR NEXT SESSION: Review HEP and progress PRN, focus on postural and hip/core strengthening with progression to upright exercises, hamstring and quad strengthening, work on floor transfers and methods for getting in/out of tub    Rosana Hoes, PT, DPT, LAT, ATC 07/26/22  8:40 AM Phone: [phone] Fax: [phone]

## 2022-07-29 ENCOUNTER — Other Ambulatory Visit (INDEPENDENT_AMBULATORY_CARE_PROVIDER_SITE_OTHER): Payer: No Typology Code available for payment source

## 2022-07-29 ENCOUNTER — Ambulatory Visit: Payer: No Typology Code available for payment source | Attending: Family Medicine | Admitting: Physical Therapy

## 2022-07-29 ENCOUNTER — Encounter: Payer: Self-pay | Admitting: Physical Therapy

## 2022-07-29 ENCOUNTER — Other Ambulatory Visit: Payer: Self-pay

## 2022-07-29 DIAGNOSIS — M546 Pain in thoracic spine: Secondary | ICD-10-CM | POA: Diagnosis not present

## 2022-07-29 DIAGNOSIS — M5459 Other low back pain: Secondary | ICD-10-CM | POA: Insufficient documentation

## 2022-07-29 DIAGNOSIS — M6281 Muscle weakness (generalized): Secondary | ICD-10-CM | POA: Diagnosis not present

## 2022-07-29 DIAGNOSIS — Z7901 Long term (current) use of anticoagulants: Secondary | ICD-10-CM | POA: Diagnosis not present

## 2022-07-29 LAB — POCT INR: INR: 2.7 (ref 2.0–3.0)

## 2022-07-29 NOTE — Patient Instructions (Addendum)
Access Code: [email or web address] URL: [email or web address] Date: 07/29/2022 Prepared by: Rosana Hoes  Exercises - Clam with Resistance  - 1 x daily - 3 sets - 15 reps - Supine Bridge with Resistance Band  - 1 x daily - 3 sets - 10 reps - Supine Shoulder Horizontal Abduction with Resistance  - 1 x daily - 3 sets - 10 reps - Standing Row with Anchored Resistance  - 1 x daily - 3 sets - 15 reps

## 2022-08-05 ENCOUNTER — Encounter: Payer: Self-pay | Admitting: Physical Therapy

## 2022-08-05 ENCOUNTER — Ambulatory Visit: Payer: No Typology Code available for payment source | Admitting: Physical Therapy

## 2022-08-05 ENCOUNTER — Other Ambulatory Visit (INDEPENDENT_AMBULATORY_CARE_PROVIDER_SITE_OTHER): Payer: No Typology Code available for payment source

## 2022-08-05 DIAGNOSIS — M6281 Muscle weakness (generalized): Secondary | ICD-10-CM

## 2022-08-05 DIAGNOSIS — Z7901 Long term (current) use of anticoagulants: Secondary | ICD-10-CM | POA: Diagnosis not present

## 2022-08-05 DIAGNOSIS — M546 Pain in thoracic spine: Secondary | ICD-10-CM

## 2022-08-05 DIAGNOSIS — M5459 Other low back pain: Secondary | ICD-10-CM

## 2022-08-05 LAB — POCT INR: INR: 2.3 (ref 2.0–3.0)

## 2022-08-05 NOTE — Therapy (Signed)
OUTPATIENT PHYSICAL THERAPY TREATMENT NOTE   Patient Name: [name] MRN: [id number] DOB:12-01-1950, 72 y.o., female Today's Date: 08/05/2022  PCP: Nestor Ramp, MD REFERRING PROVIDER: Nestor Ramp, MD   END OF SESSION:   PT End of Session - 08/05/22 0851     Visit Number 3    Number of Visits 9    Date for PT Re-Evaluation 09/03/22    Authorization Type [employer]    PT Start Time [phone]    PT Stop Time 0930    PT Time Calculation (min) 42 min             Past Medical History:  Diagnosis Date   Anemia    take iron   Anxiety    Arthritis    Depression    Dysrhythmia 2016   occ due to caffeine intake   Encounter for long-term (current) use of other medications    GERD (gastroesophageal reflux disease)    Hepatitis    age 17   History of hiatal hernia    Hypertension    IBS (irritable bowel syndrome)    Obesity    Osteoarthritis    Pain in joint, multiple sites    Pneumonia 2010   PONV (postoperative nausea and vomiting)    in the 1980's no problems since   Psoriatic arthropathy (HCC)    Pulmonary embolism (HCC) 12/03/2016   submassive/notes 12/03/2016   Raynaud disease    Past Surgical History:  Procedure Laterality Date   ABDOMINAL HYSTERECTOMY  03/1998   COLONOSCOPY     GASTRIC BYPASS  1978   HALLUX VALGUS CORRECTION  09/2010   Dr Everett Graff; right 1st ray   LAPAROSCOPIC CHOLECYSTECTOMY  04/2000   Hattie Perch 07/03/2010   LAPAROSCOPIC INCISIONAL / UMBILICAL / VENTRAL HERNIA REPAIR  09/2002   VHR/notes 07/03/2010   REVISION TOTAL KNEE ARTHROPLASTY Left 10/2003   Hattie Perch 07/03/2010   REVISION TOTAL KNEE ARTHROPLASTY Left 02/2007   Hattie Perch 06/21/2010   SHOULDER HEMI-ARTHROPLASTY Right 06/28/2016   Procedure: RIGHT SHOULDER HEMI-ARTHROPLASTY;  Surgeon: Beverely Low, MD;  Location: [facility] OR;  Service: Orthopedics;  Laterality: Right;   TOTAL KNEE ARTHROPLASTY Left 10/1999   Hattie Perch 07/03/2010   TOTAL KNEE ARTHROPLASTY Right 03/05/2021   Procedure: TOTAL KNEE  ARTHROPLASTY;  Surgeon: Ollen Gross, MD;  Location: [facility] ORS;  Service: Orthopedics;  Laterality: Right;   TUBAL LIGATION  1982   Patient Active Problem List   Diagnosis Date Noted   Nausea and vomiting 12/05/2021   Other fatigue 12/05/2021   OA (osteoarthritis) of knee 03/05/2021   Primary osteoarthritis of right knee 03/05/2021   Neuropathy 11/09/2020   Metatarsalgia of both feet 08/30/2018   Gait abnormality 08/21/2018   Chronic anticoagulation 06/24/2017   Collagenous colitis 05/07/2017   Hx vasculitis leukocytoclastic 2018 05/07/2017   GAD (generalized anxiety disorder)    S/P shoulder replacement, right 06/28/2016   Vitamin D deficiency 12/05/2015   Vitamin B 12 deficiency 12/05/2015   AC (acromioclavicular) joint arthritis 08/29/2015   Well adult health check 09/01/2013   Hammertoe 11/20/2012   Psoriatic arthritis (HCC) 04/10/2012   Pulmonary nodule seen on imaging study 11/08/2011   Dysthymia 12/12/2010   Essential hypertension 04/27/2008   Enthesopathy of ankle and tarsus 10/02/2006   KNEE REPLACEMENT, LEFT, HX OF 07/03/2006   OBESITY, NOS 04/17/2006   PANIC ATTACKS 04/17/2006   GASTROESOPHAGEAL REFLUX, NO ESOPHAGITIS 04/17/2006   VAGINITIS, ATROPHIC 04/17/2006    REFERRING DIAG: Left-sided thoracic back pain, unspecified chronicity  THERAPY DIAG:  Pain in thoracic spine  Other low back pain  Muscle weakness (generalized)  Rationale for Evaluation and Treatment Rehabilitation  PERTINENT HISTORY: Right TSA, see other PMH above   PRECAUTIONS: None    SUBJECTIVE:                                                                                                                                                                                     SUBJECTIVE STATEMENT: Back is not too bad right now. But it bothered me a lot over the weekend. I'm doing things I did not have the energy to do before. I am doing the exercises every other day.    PAIN:  Are you  having pain? Yes:  NPRS scale: 5-6/10 (8/10 at worst with activity) Pain location: Upper - lower back Pain description: Burning, aching Aggravating factors: Shopping, sweeping, mowing grass Relieving factors: Rest   OBJECTIVE: (objective measures completed at initial evaluation unless otherwise dated) PATIENT SURVEYS:  FOTO 47% functional status   MUSCLE LENGTH: Limitations bilateral hamstring and quad flexibility   POSTURE:             Increased thoracic kyphosis, forward head   PALPATION: Mildly tender to palpation left thoracic and upper trap region   LUMBAR ROM:    AROM eval  Flexion WFL  Extension WFL  Right lateral flexion WFL  Left lateral flexion WFL  Right rotation 75%  Left rotation WFL   (Blank rows = not tested)   LOWER EXTREMITY MMT:     MMT Right eval Left eval Rt / Lt 07/29/22  Hip flexion 4 4   Hip extension 4- 4- 4- / 4-  Hip abduction 4- 4- 4- / 4-  Hip adduction       Hip internal rotation       Hip external rotation       Knee flexion 5 5   Knee extension 5 5   Ankle dorsiflexion       Ankle plantarflexion       Ankle inversion       Ankle eversion        (Blank rows = not tested)   UPPER EXTREMITY MMT:   MMT Right eval Left eval  Shoulder flexion   4  Shoulder extension   4  Shoulder abduction   4  Shoulder adduction      Shoulder internal rotation   5  Shoulder external rotation   4  Middle trapezius   4-  Lower trapezius   4-  Elbow flexion      Elbow extension      Wrist flexion  Wrist extension      Wrist ulnar deviation      Wrist radial deviation      Wrist pronation      Wrist supination      Grip strength       (Blank rows = not tested)    FUNCTIONAL TESTS:  5 times sit to stand: 14 seconds  07/29/2022: 13 seconds   GAIT: Assistive device utilized: None Level of assistance: Complete Independence     TODAY'S TREATMENT:   [facility] Adult PT Treatment:                                                DATE:  08/05/22 Therapeutic Exercise: Nustep L4 UE/Lex 5 minutes  Green band row  x 15 Red band shoulder ext  x 15 STS x 10 +HEP Supine reb band horizontal abduction Supine red band ER x 10   Bridge 10 x 2  Supine hamstring stretch with strap  Prone quad stretch with strap x 2 each  S/L clam 15 x 2 each  LTR    [facility] Adult PT Treatment:                                                DATE: 07/29/2022 Therapeutic Exercise: NuStep L5 x 5 min with UE/LE while taking subjective LTR x 5 Supine hamstring stretch 2 x 30 sec each Prone quad stretch 2 x 30 sec each Bridge 2 x 10 with 5 sec hold Supine horizontal abduction with red 2 x 10 Sidelying clamshell with red 2 x 15 each Sit to stand x 10 with focus on posture Row with green 2 x 15   [facility] Adult PT Treatment:                                                DATE: 07/09/2022 Therapeutic Exercise: Clamshell with yellow x 10 each Bridge with yellow x 10 Row with red x 10   PATIENT EDUCATION:  Education details: HEP update Person educated: Patient Education method: Explanation, Handout Education comprehension: verbalized understanding, returned demonstration, verbal cues required, tactile cues required, and needs further education   HOME EXERCISE PROGRAM: Access Code: [email or web address]      ASSESSMENT: CLINICAL IMPRESSION: Patient tolerated therapy well with no adverse effects. Therapy focused primarily on progressing postural strengthening and control exercises. She does continue to exhibit gross strength and postural deficits. Cueing provided throughout session for postural control. Updated HEP with STS. Patient would benefit from continued skilled PT to progress her mobility and strength in order to reduce pain and maximize functional ability.    OBJECTIVE IMPAIRMENTS: decreased activity tolerance, decreased ROM, decreased strength, impaired flexibility, postural dysfunction, and pain.    ACTIVITY LIMITATIONS: carrying, lifting, standing,  bathing, and locomotion level   PARTICIPATION LIMITATIONS: meal prep, cleaning, shopping, community activity, and yard work   PERSONAL FACTORS: Fitness, Past/current experiences, and Time since onset of injury/illness/exacerbation are also affecting patient's functional outcome.     GOALS: Goals reviewed with patient? Yes   SHORT TERM GOALS: Target date: 08/06/2022   Patient will  be I with initial HEP in order to progress with therapy. Baseline: HEP provided at eval 07/29/2022: progressing Goal status: ONGOING   2.  Patient will report upper and lower back pain </= 4/10 with activity in order to reduce functional limitations Baseline: 8/10 pain with activity Goal status: INITIAL   3.  Patient will demonstrate 5xSTS </= 9 seconds to indicate reduced fall risk Baseline: 14 seconds 07/29/2022: 13 seconds Goal status: ONGOING   LONG TERM GOALS: Target date: 09/03/2022   Patient will be I with final HEP to maintain progress from PT. Baseline: HEP provided at eval Goal status: INITIAL   2.  Patient will report >/= 56% status on FOTO to indicate improved functional ability. Baseline: 47% functional status Goal status: INITIAL   3.  Patient will demonstrate periscapular and hip strength >/= 4/5 MMT in order to improve postural control and tolerance for standing and household activitiess Baseline: grossly 4-/5 MMT Goal status: INITIAL   4.  Patient will be able to perform floor-chair transfer and will report ability to get in and out of her bathtub at home in order to improve functional ability Baseline: patient unable to get up from floor or out of bath tub Goal status: INITIAL     PLAN: PT FREQUENCY: 1x/week   PT DURATION: 8 weeks   PLANNED INTERVENTIONS: Therapeutic exercises, Therapeutic activity, Neuromuscular re-education, Balance training, Gait training, Patient/Family education, Self Care, Joint mobilization, Aquatic Therapy, Dry Needling, Cryotherapy, Moist heat, Manual  therapy, and Re-evaluation.   PLAN FOR NEXT SESSION: Review HEP and progress PRN, focus on postural and hip/core strengthening with progression to upright exercises, hamstring and quad strengthening, work on floor transfers and methods for getting in/out of tub   Jannette Spanner, PTA 08/05/22 11:39 AM Phone: [phone] Fax: [phone]

## 2022-08-07 NOTE — Therapy (Signed)
OUTPATIENT PHYSICAL THERAPY TREATMENT NOTE   Patient Name: [name] MRN: [id number] DOB:12-08-1950, 72 y.o., female Today's Date: 08/12/2022  PCP: Nestor Ramp, MD REFERRING PROVIDER: Nestor Ramp, MD   END OF SESSION:   PT End of Session - 08/12/22 1025     Visit Number 4    Number of Visits 9    Date for PT Re-Evaluation 09/03/22    Authorization Type [employer]    PT Start Time 1015    PT Stop Time 1100    PT Time Calculation (min) 45 min    Activity Tolerance Patient tolerated treatment well    Behavior During Therapy WFL for tasks assessed/performed              Past Medical History:  Diagnosis Date   Anemia    take iron   Anxiety    Arthritis    Depression    Dysrhythmia 2016   occ due to caffeine intake   Encounter for long-term (current) use of other medications    GERD (gastroesophageal reflux disease)    Hepatitis    age 28   History of hiatal hernia    Hypertension    IBS (irritable bowel syndrome)    Obesity    Osteoarthritis    Pain in joint, multiple sites    Pneumonia 2010   PONV (postoperative nausea and vomiting)    in the 1980's no problems since   Psoriatic arthropathy (HCC)    Pulmonary embolism (HCC) 12/03/2016   submassive/notes 12/03/2016   Raynaud disease    Past Surgical History:  Procedure Laterality Date   ABDOMINAL HYSTERECTOMY  03/1998   COLONOSCOPY     GASTRIC BYPASS  1978   HALLUX VALGUS CORRECTION  09/2010   Dr Everett Graff; right 1st ray   LAPAROSCOPIC CHOLECYSTECTOMY  04/2000   Hattie Perch 07/03/2010   LAPAROSCOPIC INCISIONAL / UMBILICAL / VENTRAL HERNIA REPAIR  09/2002   VHR/notes 07/03/2010   REVISION TOTAL KNEE ARTHROPLASTY Left 10/2003   Hattie Perch 07/03/2010   REVISION TOTAL KNEE ARTHROPLASTY Left 02/2007   Hattie Perch 06/21/2010   SHOULDER HEMI-ARTHROPLASTY Right 06/28/2016   Procedure: RIGHT SHOULDER HEMI-ARTHROPLASTY;  Surgeon: Beverely Low, MD;  Location: [facility] OR;  Service: Orthopedics;  Laterality: Right;   TOTAL  KNEE ARTHROPLASTY Left 10/1999   Hattie Perch 07/03/2010   TOTAL KNEE ARTHROPLASTY Right 03/05/2021   Procedure: TOTAL KNEE ARTHROPLASTY;  Surgeon: Ollen Gross, MD;  Location: [facility] OR[facility];  Service: Orthopedics;  Laterality: Right;   TUBAL LIGATION  1982   Patient Active Problem List   Diagnosis Date Noted   Nausea and vomiting 12/05/2021   Other fatigue 12/05/2021   OA (osteoarthritis) of knee 03/05/2021   Primary osteoarthritis of right knee 03/05/2021   Neuropathy 11/09/2020   Metatarsalgia of both feet 08/30/2018   Gait abnormality 08/21/2018   Chronic anticoagulation 06/24/2017   Collagenous colitis 05/07/2017   Hx vasculitis leukocytoclastic 2018 05/07/2017   GAD (generalized anxiety disorder)    S/P shoulder replacement, right 06/28/2016   Vitamin D deficiency 12/05/2015   Vitamin B 12 deficiency 12/05/2015   AC (acromioclavicular) joint arthritis 08/29/2015   Well adult health check 09/01/2013   Hammertoe 11/20/2012   Psoriatic arthritis (HCC) 04/10/2012   Pulmonary nodule seen on imaging study 11/08/2011   Dysthymia 12/12/2010   Essential hypertension 04/27/2008   Enthesopathy of ankle and tarsus 10/02/2006   KNEE REPLACEMENT, LEFT, HX OF 07/03/2006   OBESITY, NOS 04/17/2006   PANIC ATTACKS 04/17/2006   GASTROESOPHAGEAL  REFLUX, NO ESOPHAGITIS 04/17/2006   VAGINITIS, ATROPHIC 04/17/2006    REFERRING DIAG: Left-sided thoracic back pain, unspecified chronicity   THERAPY DIAG:  Pain in thoracic spine  Other low back pain  Muscle weakness (generalized)  Rationale for Evaluation and Treatment Rehabilitation  PERTINENT HISTORY: Right TSA, see other PMH above   PRECAUTIONS: None    SUBJECTIVE:                                                                                                                                                                                     SUBJECTIVE STATEMENT: Patient reports she is doing well. She has been doing a lot of yard work.    PAIN:  Are you having pain? Yes:  NPRS scale: 4/10 (8/10 at worst with activity) Pain location: Upper - lower back Pain description: Burning, aching Aggravating factors: Shopping, sweeping, mowing grass Relieving factors: Rest   OBJECTIVE: (objective measures completed at initial evaluation unless otherwise dated) PATIENT SURVEYS:  FOTO 47% functional status   MUSCLE LENGTH: Limitations bilateral hamstring and quad flexibility   POSTURE:             Increased thoracic kyphosis, forward head   PALPATION: Mildly tender to palpation left thoracic and upper trap region   LUMBAR ROM:    AROM eval  Flexion WFL  Extension WFL  Right lateral flexion WFL  Left lateral flexion WFL  Right rotation 75%  Left rotation WFL   (Blank rows = not tested)   LOWER EXTREMITY MMT:     MMT Right eval Left eval Rt / Lt 07/29/22  Hip flexion 4 4   Hip extension 4- 4- 4- / 4-  Hip abduction 4- 4- 4- / 4-  Hip adduction       Hip internal rotation       Hip external rotation       Knee flexion 5 5   Knee extension 5 5   Ankle dorsiflexion       Ankle plantarflexion       Ankle inversion       Ankle eversion        (Blank rows = not tested)   UPPER EXTREMITY MMT:   MMT Right eval Left eval  Shoulder flexion   4  Shoulder extension   4  Shoulder abduction   4  Shoulder adduction      Shoulder internal rotation   5  Shoulder external rotation   4  Middle trapezius   4-  Lower trapezius   4-  Elbow flexion      Elbow extension      Wrist flexion  Wrist extension      Wrist ulnar deviation      Wrist radial deviation      Wrist pronation      Wrist supination      Grip strength       (Blank rows = not tested)    FUNCTIONAL TESTS:  5 times sit to stand: 14 seconds  07/29/2022: 13 seconds   GAIT: Assistive device utilized: None Level of assistance: Complete Independence     TODAY'S TREATMENT:   [facility] Adult PT Treatment:                                                 DATE: 08/12/2022 Therapeutic Exercise: NuStep L5 x 5 min with UE/LE while taking subjective Sidelying thoracic rotation x 10 LTR x 10 Supine horizontal abduction with green 2 x 10 Bridge 2 x 10 with green at [employer] with green x 20, black x 20 Extension with green x 20 Double ER and scap retraction with green 2 x 10 Sit to stand holding yellow ball at chest 2 x 10   [facility] Adult PT Treatment:                                                DATE: 08/05/22 Therapeutic Exercise: Nustep L4 UE/Lex 5 minutes  Green band row  x 15 Red band shoulder ext  x 15 STS x 10 +HEP Supine reb band horizontal abduction Supine red band ER x 10   Bridge 10 x 2  Supine hamstring stretch with strap  Prone quad stretch with strap x 2 each  S/L clam 15 x 2 each  LTR  [facility] Adult PT Treatment:                                                DATE: 07/29/2022 Therapeutic Exercise: NuStep L5 x 5 min with UE/LE while taking subjective LTR x 5 Supine hamstring stretch 2 x 30 sec each Prone quad stretch 2 x 30 sec each Bridge 2 x 10 with 5 sec hold Supine horizontal abduction with red 2 x 10 Sidelying clamshell with red 2 x 15 each Sit to stand x 10 with focus on posture Row with green 2 x 15   PATIENT EDUCATION:  Education details: HEP update Person educated: Patient Education method: Explanation, Handout Education comprehension: verbalized understanding, returned demonstration, verbal cues required, tactile cues required, and needs further education   HOME EXERCISE PROGRAM: Access Code: [email or web address]      ASSESSMENT: CLINICAL IMPRESSION: Patient tolerated therapy well with no adverse effects. Therapy focused on progressing postural control and strengthening with good tolerance. She was able to tolerated progressions in resistance of exercises and does require consistent cueing for postural control to avoid excessive thoracic kyphosis and forward head posture. Patient would benefit from continued  skilled PT to progress her mobility and strength in order to reduce pain and maximize functional ability.    OBJECTIVE IMPAIRMENTS: decreased activity tolerance, decreased ROM, decreased strength, impaired flexibility, postural dysfunction, and pain.    ACTIVITY LIMITATIONS: carrying, lifting, standing, bathing,  and locomotion level   PARTICIPATION LIMITATIONS: meal prep, cleaning, shopping, community activity, and yard work   PERSONAL FACTORS: Fitness, Past/current experiences, and Time since onset of injury/illness/exacerbation are also affecting patient's functional outcome.     GOALS: Goals reviewed with patient? Yes   SHORT TERM GOALS: Target date: 08/06/2022   Patient will be I with initial HEP in order to progress with therapy. Baseline: HEP provided at eval 07/29/2022: progressing Goal status: ONGOING   2.  Patient will report upper and lower back pain </= 4/10 with activity in order to reduce functional limitations Baseline: 8/10 pain with activity Goal status: INITIAL   3.  Patient will demonstrate 5xSTS </= 9 seconds to indicate reduced fall risk Baseline: 14 seconds 07/29/2022: 13 seconds Goal status: ONGOING   LONG TERM GOALS: Target date: 09/03/2022   Patient will be I with final HEP to maintain progress from PT. Baseline: HEP provided at eval Goal status: INITIAL   2.  Patient will report >/= 56% status on FOTO to indicate improved functional ability. Baseline: 47% functional status Goal status: INITIAL   3.  Patient will demonstrate periscapular and hip strength >/= 4/5 MMT in order to improve postural control and tolerance for standing and household activitiess Baseline: grossly 4-/5 MMT Goal status: INITIAL   4.  Patient will be able to perform floor-chair transfer and will report ability to get in and out of her bathtub at home in order to improve functional ability Baseline: patient unable to get up from floor or out of bath tub Goal status: INITIAL      PLAN: PT FREQUENCY: 1x/week   PT DURATION: 8 weeks   PLANNED INTERVENTIONS: Therapeutic exercises, Therapeutic activity, Neuromuscular re-education, Balance training, Gait training, Patient/Family education, Self Care, Joint mobilization, Aquatic Therapy, Dry Needling, Cryotherapy, Moist heat, Manual therapy, and Re-evaluation.   PLAN FOR NEXT SESSION: Review HEP and progress PRN, focus on postural and hip/core strengthening with progression to upright exercises, hamstring and quad strengthening, work on floor transfers and methods for getting in/out of tub   Rosana Hoes, PT, DPT, LAT, ATC 08/12/22  11:03 AM Phone: [phone] Fax: [phone]

## 2022-08-12 ENCOUNTER — Ambulatory Visit: Payer: No Typology Code available for payment source | Admitting: Physical Therapy

## 2022-08-12 ENCOUNTER — Other Ambulatory Visit: Payer: Self-pay

## 2022-08-12 ENCOUNTER — Encounter: Payer: Self-pay | Admitting: Physical Therapy

## 2022-08-12 DIAGNOSIS — M5459 Other low back pain: Secondary | ICD-10-CM

## 2022-08-12 DIAGNOSIS — M6281 Muscle weakness (generalized): Secondary | ICD-10-CM

## 2022-08-12 DIAGNOSIS — M546 Pain in thoracic spine: Secondary | ICD-10-CM

## 2022-08-19 ENCOUNTER — Encounter: Payer: Self-pay | Admitting: Physical Therapy

## 2022-08-19 ENCOUNTER — Ambulatory Visit: Payer: No Typology Code available for payment source | Attending: Family Medicine | Admitting: Physical Therapy

## 2022-08-19 ENCOUNTER — Other Ambulatory Visit (INDEPENDENT_AMBULATORY_CARE_PROVIDER_SITE_OTHER): Payer: No Typology Code available for payment source

## 2022-08-19 DIAGNOSIS — M546 Pain in thoracic spine: Secondary | ICD-10-CM | POA: Diagnosis not present

## 2022-08-19 DIAGNOSIS — M5459 Other low back pain: Secondary | ICD-10-CM | POA: Diagnosis not present

## 2022-08-19 DIAGNOSIS — Z7901 Long term (current) use of anticoagulants: Secondary | ICD-10-CM

## 2022-08-19 DIAGNOSIS — M6281 Muscle weakness (generalized): Secondary | ICD-10-CM

## 2022-08-19 LAB — POCT INR: INR: 2.1 (ref 2.0–3.0)

## 2022-08-19 NOTE — Therapy (Signed)
OUTPATIENT PHYSICAL THERAPY TREATMENT NOTE   Patient Name: [name] MRN: [id number] DOB:1951-01-06, 72 y.o., female Today's Date: 08/19/2022  PCP: Nestor Ramp, MD REFERRING PROVIDER: Nestor Ramp, MD   END OF SESSION:   PT End of Session - 08/19/22 1019     Visit Number 5    Number of Visits 9    Date for PT Re-Evaluation 09/03/22    Authorization Type [employer]    PT Start Time 1018    PT Stop Time 1100    PT Time Calculation (min) 42 min              Past Medical History:  Diagnosis Date   Anemia    take iron   Anxiety    Arthritis    Depression    Dysrhythmia 2016   occ due to caffeine intake   Encounter for long-term (current) use of other medications    GERD (gastroesophageal reflux disease)    Hepatitis    age 54   History of hiatal hernia    Hypertension    IBS (irritable bowel syndrome)    Obesity    Osteoarthritis    Pain in joint, multiple sites    Pneumonia 2010   PONV (postoperative nausea and vomiting)    in the 1980's no problems since   Psoriatic arthropathy (HCC)    Pulmonary embolism (HCC) 12/03/2016   submassive/notes 12/03/2016   Raynaud disease    Past Surgical History:  Procedure Laterality Date   ABDOMINAL HYSTERECTOMY  03/1998   COLONOSCOPY     GASTRIC BYPASS  1978   HALLUX VALGUS CORRECTION  09/2010   Dr Everett Graff; right 1st ray   LAPAROSCOPIC CHOLECYSTECTOMY  04/2000   Hattie Perch 07/03/2010   LAPAROSCOPIC INCISIONAL / UMBILICAL / VENTRAL HERNIA REPAIR  09/2002   VHR/notes 07/03/2010   REVISION TOTAL KNEE ARTHROPLASTY Left 10/2003   Hattie Perch 07/03/2010   REVISION TOTAL KNEE ARTHROPLASTY Left 02/2007   Hattie Perch 06/21/2010   SHOULDER HEMI-ARTHROPLASTY Right 06/28/2016   Procedure: RIGHT SHOULDER HEMI-ARTHROPLASTY;  Surgeon: Beverely Low, MD;  Location: [facility] OR;  Service: Orthopedics;  Laterality: Right;   TOTAL KNEE ARTHROPLASTY Left 10/1999   Hattie Perch 07/03/2010   TOTAL KNEE ARTHROPLASTY Right 03/05/2021   Procedure: TOTAL  KNEE ARTHROPLASTY;  Surgeon: Ollen Gross, MD;  Location: [facility] ORS;  Service: Orthopedics;  Laterality: Right;   TUBAL LIGATION  1982   Patient Active Problem List   Diagnosis Date Noted   Nausea and vomiting 12/05/2021   Other fatigue 12/05/2021   OA (osteoarthritis) of knee 03/05/2021   Primary osteoarthritis of right knee 03/05/2021   Neuropathy 11/09/2020   Metatarsalgia of both feet 08/30/2018   Gait abnormality 08/21/2018   Chronic anticoagulation 06/24/2017   Collagenous colitis 05/07/2017   Hx vasculitis leukocytoclastic 2018 05/07/2017   GAD (generalized anxiety disorder)    S/P shoulder replacement, right 06/28/2016   Vitamin D deficiency 12/05/2015   Vitamin B 12 deficiency 12/05/2015   AC (acromioclavicular) joint arthritis 08/29/2015   Well adult health check 09/01/2013   Hammertoe 11/20/2012   Psoriatic arthritis (HCC) 04/10/2012   Pulmonary nodule seen on imaging study 11/08/2011   Dysthymia 12/12/2010   Essential hypertension 04/27/2008   Enthesopathy of ankle and tarsus 10/02/2006   KNEE REPLACEMENT, LEFT, HX OF 07/03/2006   OBESITY, NOS 04/17/2006   PANIC ATTACKS 04/17/2006   GASTROESOPHAGEAL REFLUX, NO ESOPHAGITIS 04/17/2006   VAGINITIS, ATROPHIC 04/17/2006    REFERRING DIAG: Left-sided thoracic back pain, unspecified  chronicity   THERAPY DIAG:  Pain in thoracic spine  Other low back pain  Muscle weakness (generalized)  Rationale for Evaluation and Treatment Rehabilitation  PERTINENT HISTORY: Right TSA, see other PMH above   PRECAUTIONS: None    SUBJECTIVE:                                                                                                                                                                                     SUBJECTIVE STATEMENT:4.5/10 pain. If I stand any length of time, my back will start hurting. If I am looking through clothing racks, my upper back and neck will get stiff.    PAIN:  Are you having pain? Yes:  NPRS  scale: 4.5/10 (8/10 at worst with activity) Pain location: Upper - lower back Pain description: Burning, aching Aggravating factors: Shopping, sweeping, mowing grass Relieving factors: Rest   OBJECTIVE: (objective measures completed at initial evaluation unless otherwise dated) PATIENT SURVEYS:  FOTO 47% functional status   MUSCLE LENGTH: Limitations bilateral hamstring and quad flexibility   POSTURE:             Increased thoracic kyphosis, forward head   PALPATION: Mildly tender to palpation left thoracic and upper trap region   LUMBAR ROM:    AROM eval  Flexion WFL  Extension WFL  Right lateral flexion WFL  Left lateral flexion WFL  Right rotation 75%  Left rotation WFL   (Blank rows = not tested)   LOWER EXTREMITY MMT:     MMT Right eval Left eval Rt / Lt 07/29/22  Hip flexion 4 4   Hip extension 4- 4- 4- / 4-  Hip abduction 4- 4- 4- / 4-  Hip adduction       Hip internal rotation       Hip external rotation       Knee flexion 5 5   Knee extension 5 5   Ankle dorsiflexion       Ankle plantarflexion       Ankle inversion       Ankle eversion        (Blank rows = not tested)   UPPER EXTREMITY MMT:   MMT Right eval Left eval  Shoulder flexion   4  Shoulder extension   4  Shoulder abduction   4  Shoulder adduction      Shoulder internal rotation   5  Shoulder external rotation   4  Middle trapezius   4-  Lower trapezius   4-  Elbow flexion      Elbow extension      Wrist flexion      Wrist extension  Wrist ulnar deviation      Wrist radial deviation      Wrist pronation      Wrist supination      Grip strength       (Blank rows = not tested)    FUNCTIONAL TESTS:  5 times sit to stand: 14 seconds  07/29/2022: 13 seconds   GAIT: Assistive device utilized: None Level of assistance: Complete Independence     TODAY'S TREATMENT:   [facility] Adult PT Treatment:                                                DATE: 08/19/22 Therapeutic  Exercise: Nustep L4 UE/LE x 5 minutes  Side stepping red band at chest x 5 each way  Green band shoulder ext x 20  Row back x 20  Banded bridge blue x 20  Supine blue band horiz STS 2000 gram ball x 15 Seated alternating shoulder taps with weighted ball Supine holding weight ball above chest with supine march x 10 Hooklying lat pullover with weighted ball x 10  S/L clam x 15 blue  Standing hip abdct x 10, added red band x 10 each Standing hip ext red band , forearms on counter   Therapeutic Activity: Mat to floor transfer then quadruped to standing using mat table to assist     [facility] Adult PT Treatment:                                                DATE: 08/12/2022 Therapeutic Exercise: NuStep L5 x 5 min with UE/LE while taking subjective Sidelying thoracic rotation x 10 LTR x 10 Supine horizontal abduction with green 2 x 10 Bridge 2 x 10 with green at [employer] with green x 20, black x 20 Extension with green x 20 Double ER and scap retraction with green 2 x 10 Sit to stand holding yellow ball at chest 2 x 10   [facility] Adult PT Treatment:                                                DATE: 08/05/22 Therapeutic Exercise: Nustep L4 UE/Lex 5 minutes  Green band row  x 15 Red band shoulder ext  x 15 STS x 10 +HEP Supine reb band horizontal abduction Supine red band ER x 10   Bridge 10 x 2  Supine hamstring stretch with strap  Prone quad stretch with strap x 2 each  S/L clam 15 x 2 each  LTR  [facility] Adult PT Treatment:                                                DATE: 07/29/2022 Therapeutic Exercise: NuStep L5 x 5 min with UE/LE while taking subjective LTR x 5 Supine hamstring stretch 2 x 30 sec each Prone quad stretch 2 x 30 sec each Bridge 2 x 10 with 5 sec hold Supine horizontal abduction with red 2 x  10 Sidelying clamshell with red 2 x 15 each Sit to stand x 10 with focus on posture Row with green 2 x 15   PATIENT EDUCATION:  Education details: HEP  update Person educated: Patient Education method: Explanation, Handout Education comprehension: verbalized understanding, returned demonstration, verbal cues required, tactile cues required, and needs further education   HOME EXERCISE PROGRAM: Access Code: [email or web address]      ASSESSMENT: CLINICAL IMPRESSION: Patient tolerated therapy well with no adverse effects. She reports pain intensity is decreasing in her back with worst being 6.5/10 instead of 8/10. She does still have increased pain with standing and shopping. Able to progress core and hip strength with good tolerance. She has purchased a weighted ball to use with her STS and she was instructed in other core exercises she can do with the weighted ball. She was able to complete a floor transfer with mat table UE assist, partially meeting LTG #4.   Patient would benefit from continued skilled PT to progress her mobility and strength in order to reduce pain and maximize functional ability.    OBJECTIVE IMPAIRMENTS: decreased activity tolerance, decreased ROM, decreased strength, impaired flexibility, postural dysfunction, and pain.    ACTIVITY LIMITATIONS: carrying, lifting, standing, bathing, and locomotion level   PARTICIPATION LIMITATIONS: meal prep, cleaning, shopping, community activity, and yard work   PERSONAL FACTORS: Fitness, Past/current experiences, and Time since onset of injury/illness/exacerbation are also affecting patient's functional outcome.     GOALS: Goals reviewed with patient? Yes   SHORT TERM GOALS: Target date: 08/06/2022   Patient will be I with initial HEP in order to progress with therapy. Baseline: HEP provided at eval 07/29/2022: progressing Goal status: ONGOING   2.  Patient will report upper and lower back pain </= 4/10 with activity in order to reduce functional limitations Baseline: 8/10 pain with activity 08/19/22: 6.5/10 max lately.  Goal status: ONGOING   3.  Patient will demonstrate 5xSTS </= 9  seconds to indicate reduced fall risk Baseline: 14 seconds 07/29/2022: 13 seconds Goal status: ONGOING   LONG TERM GOALS: Target date: 09/03/2022   Patient will be I with final HEP to maintain progress from PT. Baseline: HEP provided at eval Goal status: INITIAL   2.  Patient will report >/= 56% status on FOTO to indicate improved functional ability. Baseline: 47% functional status Goal status: INITIAL   3.  Patient will demonstrate periscapular and hip strength >/= 4/5 MMT in order to improve postural control and tolerance for standing and household activitiess Baseline: grossly 4-/5 MMT Goal status: INITIAL   4.  Patient will be able to perform floor-chair transfer and will report ability to get in and out of her bathtub at home in order to improve functional ability Baseline: patient unable to get up from floor or out of bath tub 08/19/22: able to perform Goal status: PARTIALLY MET     PLAN: PT FREQUENCY: 1x/week   PT DURATION: 8 weeks   PLANNED INTERVENTIONS: Therapeutic exercises, Therapeutic activity, Neuromuscular re-education, Balance training, Gait training, Patient/Family education, Self Care, Joint mobilization, Aquatic Therapy, Dry Needling, Cryotherapy, Moist heat, Manual therapy, and Re-evaluation.   PLAN FOR NEXT SESSION: Review HEP and progress PRN, focus on postural and hip/core strengthening with progression to upright exercises, hamstring and quad strengthening, work on floor transfers(done) and methods for getting in/out of tub(pt to do  a dry run at home)    Jannette Spanner, PTA 08/19/22 11:21 AM Phone: [phone] Fax: [phone]

## 2022-08-26 ENCOUNTER — Encounter: Payer: Self-pay | Admitting: Physical Therapy

## 2022-08-26 ENCOUNTER — Other Ambulatory Visit: Payer: Self-pay

## 2022-08-26 ENCOUNTER — Ambulatory Visit: Payer: No Typology Code available for payment source | Admitting: Physical Therapy

## 2022-08-26 DIAGNOSIS — M546 Pain in thoracic spine: Secondary | ICD-10-CM

## 2022-08-26 DIAGNOSIS — M5459 Other low back pain: Secondary | ICD-10-CM

## 2022-08-26 DIAGNOSIS — M6281 Muscle weakness (generalized): Secondary | ICD-10-CM

## 2022-08-26 NOTE — Patient Instructions (Signed)
Access Code: [email or web address] URL: [email or web address] Date: 08/26/2022 Prepared by: Rosana Hoes  Exercises - Supine Lower Trunk Rotation  - 1 x daily - 10 reps - Clam with Resistance  - 1 x daily - 3 sets - 15 reps - Supine Bridge with Resistance Band  - 1 x daily - 3 sets - 10 reps - Supine march holding ball above chest  - 1 x daily - 3 sets - 10 reps - Seated Shoulder Horizontal Abduction with Resistance - Palms Down  - 1 x daily - 3 sets - 10 reps - Sit to stand with control  - 1 x daily - 3 sets - 10 reps - Standing Row with Anchored Resistance  - 1 x daily - 3 sets - 15 reps - Shoulder Extension with Band  - 1 x daily - 3 sets - 10 reps - Standing Hip Abduction with Counter Support  - 1 x daily - 3 sets - 15 reps - Standing Hip Extension with Counter Support  - 1 x daily - 3 sets - 15 reps

## 2022-08-26 NOTE — Therapy (Addendum)
OUTPATIENT PHYSICAL THERAPY TREATMENT NOTE  DISCHARGE   Patient Name: [name] MRN: [id number] DOB:Feb 15, 1951, 72 y.o., female Today's Date: 08/26/2022  PCP: Nestor Ramp, MD REFERRING PROVIDER: Nestor Ramp, MD   END OF SESSION:   PT End of Session - 08/26/22 1015     Visit Number 6    Number of Visits 9    Date for PT Re-Evaluation 09/03/22    Authorization Type [employer]    PT Start Time 1015    PT Stop Time 1100    PT Time Calculation (min) 45 min    Activity Tolerance Patient tolerated treatment well    Behavior During Therapy WFL for tasks assessed/performed               Past Medical History:  Diagnosis Date   Anemia    take iron   Anxiety    Arthritis    Depression    Dysrhythmia 2016   occ due to caffeine intake   Encounter for long-term (current) use of other medications    GERD (gastroesophageal reflux disease)    Hepatitis    age 89   History of hiatal hernia    Hypertension    IBS (irritable bowel syndrome)    Obesity    Osteoarthritis    Pain in joint, multiple sites    Pneumonia 2010   PONV (postoperative nausea and vomiting)    in the 1980's no problems since   Psoriatic arthropathy (HCC)    Pulmonary embolism (HCC) 12/03/2016   submassive/notes 12/03/2016   Raynaud disease    Past Surgical History:  Procedure Laterality Date   ABDOMINAL HYSTERECTOMY  03/1998   COLONOSCOPY     GASTRIC BYPASS  1978   HALLUX VALGUS CORRECTION  09/2010   Dr Everett Graff; right 1st ray   LAPAROSCOPIC CHOLECYSTECTOMY  04/2000   Hattie Perch 07/03/2010   LAPAROSCOPIC INCISIONAL / UMBILICAL / VENTRAL HERNIA REPAIR  09/2002   VHR/notes 07/03/2010   REVISION TOTAL KNEE ARTHROPLASTY Left 10/2003   Hattie Perch 07/03/2010   REVISION TOTAL KNEE ARTHROPLASTY Left 02/2007   Hattie Perch 06/21/2010   SHOULDER HEMI-ARTHROPLASTY Right 06/28/2016   Procedure: RIGHT SHOULDER HEMI-ARTHROPLASTY;  Surgeon: Beverely Low, MD;  Location: [facility] OR;  Service: Orthopedics;  Laterality:  Right;   TOTAL KNEE ARTHROPLASTY Left 10/1999   Hattie Perch 07/03/2010   TOTAL KNEE ARTHROPLASTY Right 03/05/2021   Procedure: TOTAL KNEE ARTHROPLASTY;  Surgeon: Ollen Gross, MD;  Location: [facility] ORS;  Service: Orthopedics;  Laterality: Right;   TUBAL LIGATION  1982   Patient Active Problem List   Diagnosis Date Noted   Nausea and vomiting 12/05/2021   Other fatigue 12/05/2021   OA (osteoarthritis) of knee 03/05/2021   Primary osteoarthritis of right knee 03/05/2021   Neuropathy 11/09/2020   Metatarsalgia of both feet 08/30/2018   Gait abnormality 08/21/2018   Chronic anticoagulation 06/24/2017   Collagenous colitis 05/07/2017   Hx vasculitis leukocytoclastic 2018 05/07/2017   GAD (generalized anxiety disorder)    S/P shoulder replacement, right 06/28/2016   Vitamin D deficiency 12/05/2015   Vitamin B 12 deficiency 12/05/2015   AC (acromioclavicular) joint arthritis 08/29/2015   Well adult health check 09/01/2013   Hammertoe 11/20/2012   Psoriatic arthritis (HCC) 04/10/2012   Pulmonary nodule seen on imaging study 11/08/2011   Dysthymia 12/12/2010   Essential hypertension 04/27/2008   Enthesopathy of ankle and tarsus 10/02/2006   KNEE REPLACEMENT, LEFT, HX OF 07/03/2006   OBESITY, NOS 04/17/2006   PANIC ATTACKS 04/17/2006  GASTROESOPHAGEAL REFLUX, NO ESOPHAGITIS 04/17/2006   VAGINITIS, ATROPHIC 04/17/2006    REFERRING DIAG: Left-sided thoracic back pain, unspecified chronicity   THERAPY DIAG:  Pain in thoracic spine  Other low back pain  Muscle weakness (generalized)  Rationale for Evaluation and Treatment Rehabilitation  PERTINENT HISTORY: Right TSA, see other PMH above   PRECAUTIONS: None    SUBJECTIVE:                                                                                                                                                                                     SUBJECTIVE STATEMENT: Patient reports she has definitely improved with therapy. She  does still have trouble standing and shopping for extended periods, when looking forward and sorting through clothes. She has noticed her right shoulder feels stronger. She is going to have to take a break from therapy since her husband is starting neuro PT.  PAIN:  Are you having pain? Yes:  NPRS scale: 4/10 (7/10 at worst with activity) Pain location: Upper - lower back Pain description: Burning, aching Aggravating factors: Shopping, sweeping, mowing grass Relieving factors: Rest   OBJECTIVE: (objective measures completed at initial evaluation unless otherwise dated) PATIENT SURVEYS:  FOTO 47% functional status  08/26/2022: 51%   MUSCLE LENGTH: Limitations bilateral hamstring and quad flexibility   POSTURE:             Increased thoracic kyphosis, forward head   PALPATION: Mildly tender to palpation left thoracic and upper trap region   LUMBAR ROM:    AROM eval  Flexion WFL  Extension WFL  Right lateral flexion WFL  Left lateral flexion WFL  Right rotation 75%  Left rotation WFL   (Blank rows = not tested)   LOWER EXTREMITY MMT:     MMT Right eval Left eval Rt / Lt 07/29/22  Hip flexion 4 4   Hip extension 4- 4- 4- / 4-  Hip abduction 4- 4- 4- / 4-  Hip adduction       Hip internal rotation       Hip external rotation       Knee flexion 5 5   Knee extension 5 5   Ankle dorsiflexion       Ankle plantarflexion       Ankle inversion       Ankle eversion        (Blank rows = not tested)   UPPER EXTREMITY MMT:   MMT Right eval Left eval  Shoulder flexion   4  Shoulder extension   4  Shoulder abduction   4  Shoulder adduction      Shoulder internal rotation   5  Shoulder external rotation   4  Middle trapezius   4-  Lower trapezius   4-  Elbow flexion      Elbow extension      Wrist flexion      Wrist extension      Wrist ulnar deviation      Wrist radial deviation      Wrist pronation      Wrist supination      Grip strength       (Blank rows =  not tested)    FUNCTIONAL TESTS:  5 times sit to stand: 14 seconds  07/29/2022: 13 seconds 08/26/2022: 9 seconds   GAIT: Assistive device utilized: None Level of assistance: Complete Independence     TODAY'S TREATMENT:   [facility] Adult PT Treatment:                                                DATE: 08/26/2022 Therapeutic Exercise: NuStep L5 x 5 min with UE/LE while taking subjective Row with blue 2 x 20 Extension with red 2 x 20 Standing hip abduction and extension with red at knees 2 x 15 each Seated horizontal abduction with red 2 x 10 Sit to stand with blue ball at chest 2 x 10 LTR x 10 Sidelying thoracic rotation x 10 each   [facility] Adult PT Treatment:                                                DATE: 08/19/22 Therapeutic Exercise: Nustep L4 UE/LE x 5 minutes  Side stepping red band at chest x 5 each way  Green band shoulder ext x 20  Row back x 20  Banded bridge blue x 20  Supine blue band horiz STS 2000 gram ball x 15 Seated alternating shoulder taps with weighted ball Supine holding weight ball above chest with supine march x 10 Hooklying lat pullover with weighted ball x 10  S/L clam x 15 blue  Standing hip abdct x 10, added red band x 10 each Standing hip ext red band , forearms on counter  Therapeutic Activity: Mat to floor transfer then quadruped to standing using mat table to assist   [facility] Adult PT Treatment:                                                DATE: 08/12/2022 Therapeutic Exercise: NuStep L5 x 5 min with UE/LE while taking subjective Sidelying thoracic rotation x 10 LTR x 10 Supine horizontal abduction with green 2 x 10 Bridge 2 x 10 with green at [employer] with green x 20, black x 20 Extension with green x 20 Double ER and scap retraction with green 2 x 10 Sit to stand holding yellow ball at chest 2 x 10   PATIENT EDUCATION:  Education details: HEP update Person educated: Patient Education method: Explanation, Handout Education comprehension:  verbalized understanding, returned demonstration, verbal cues required, tactile cues required, and needs further education   HOME EXERCISE PROGRAM: Access Code: [email or web address]      ASSESSMENT: CLINICAL IMPRESSION: Patient tolerated therapy well with  no adverse effects. Therapy continues to focus on progressing postural strengthening with good tolerance. She does demonstrate an improvement in her 5xSTS this visit and reports improvement in her functional ability on FOTO. She is going to take a break from therapy while she takes her husband to neuro PT but hopes to return to therapy soon. Updated her HEP to progress strengthening for home. Patient would benefit from continued skilled PT to progress her mobility and strength in order to reduce pain and maximize functional ability.     OBJECTIVE IMPAIRMENTS: decreased activity tolerance, decreased ROM, decreased strength, impaired flexibility, postural dysfunction, and pain.    ACTIVITY LIMITATIONS: carrying, lifting, standing, bathing, and locomotion level   PARTICIPATION LIMITATIONS: meal prep, cleaning, shopping, community activity, and yard work   PERSONAL FACTORS: Fitness, Past/current experiences, and Time since onset of injury/illness/exacerbation are also affecting patient's functional outcome.     GOALS: Goals reviewed with patient? Yes   SHORT TERM GOALS: Target date: 08/06/2022   Patient will be I with initial HEP in order to progress with therapy. Baseline: HEP provided at eval 07/29/2022: progressing 08/26/2022: independent Goal status: MET   2.  Patient will report upper and lower back pain </= 4/10 with activity in order to reduce functional limitations Baseline: 8/10 pain with activity 08/19/22: 6.5/10 max lately.  08/26/2022: 7/10 with standing and shopping Goal status: ONGOING   3.  Patient will demonstrate 5xSTS </= 9 seconds to indicate reduced fall risk Baseline: 14 seconds 07/29/2022: 13 seconds 08/26/2022: 9 seconds Goal  status: MET   LONG TERM GOALS: Target date: 09/03/2022   Patient will be I with final HEP to maintain progress from PT. Baseline: HEP provided at eval Goal status: INITIAL   2.  Patient will report >/= 56% status on FOTO to indicate improved functional ability. Baseline: 47% functional status 08/26/2022: 51% Goal status: ONGOING   3.  Patient will demonstrate periscapular and hip strength >/= 4/5 MMT in order to improve postural control and tolerance for standing and household activitiess Baseline: grossly 4-/5 MMT Goal status: INITIAL   4.  Patient will be able to perform floor-chair transfer and will report ability to get in and out of her bathtub at home in order to improve functional ability Baseline: patient unable to get up from floor or out of bath tub 08/19/22: able to perform floor-chair transfer Goal status: PARTIALLY MET     PLAN: PT FREQUENCY: 1x/week   PT DURATION: 8 weeks   PLANNED INTERVENTIONS: Therapeutic exercises, Therapeutic activity, Neuromuscular re-education, Balance training, Gait training, Patient/Family education, Self Care, Joint mobilization, Aquatic Therapy, Dry Needling, Cryotherapy, Moist heat, Manual therapy, and Re-evaluation.   PLAN FOR NEXT SESSION: Review HEP and progress PRN, focus on postural and hip/core strengthening with progression to upright exercises, hamstring and quad strengthening, work on floor transfers(done) and methods for getting in/out of tub   Rosana Hoes, PT, DPT, LAT, ATC 08/26/22  11:05 AM Phone: [phone] Fax: [phone]    PHYSICAL THERAPY DISCHARGE SUMMARY  Visits from Start of Care: 6  Current functional level related to goals / functional outcomes: See above   Remaining deficits: See above   Education / Equipment: HEP   Patient agrees to discharge. Patient goals were partially met. Patient is being discharged due to not returning since the last visit.  Rosana Hoes, PT, DPT, LAT, ATC 10/03/22   2:17 PM Phone: [phone] Fax: [phone]

## 2022-08-28 ENCOUNTER — Encounter: Payer: Self-pay | Admitting: Family Medicine

## 2022-08-29 MED ORDER — VENLAFAXINE HCL ER 150 MG PO CP24: 150.0000 mg | ORAL_CAPSULE | Freq: Every day | ORAL | 3 refills | Status: DC

## 2022-08-29 MED ORDER — VENLAFAXINE HCL ER 75 MG PO CP24: ORAL_CAPSULE | ORAL | 3 refills | Status: DC

## 2022-08-29 MED ORDER — BENAZEPRIL HCL 20 MG PO TABS: 20.0000 mg | ORAL_TABLET | Freq: Every day | ORAL | 3 refills | Status: DC

## 2022-09-09 ENCOUNTER — Other Ambulatory Visit (INDEPENDENT_AMBULATORY_CARE_PROVIDER_SITE_OTHER): Payer: No Typology Code available for payment source

## 2022-09-09 DIAGNOSIS — Z7901 Long term (current) use of anticoagulants: Secondary | ICD-10-CM | POA: Diagnosis not present

## 2022-09-09 LAB — POCT INR: INR: 1.9 — AB (ref 2.0–3.0)

## 2022-09-16 ENCOUNTER — Other Ambulatory Visit (INDEPENDENT_AMBULATORY_CARE_PROVIDER_SITE_OTHER): Payer: No Typology Code available for payment source

## 2022-09-16 DIAGNOSIS — Z7901 Long term (current) use of anticoagulants: Secondary | ICD-10-CM | POA: Diagnosis not present

## 2022-09-16 LAB — POCT INR: INR: 1.7 — AB (ref 2.0–3.0)

## 2022-09-23 ENCOUNTER — Other Ambulatory Visit: Payer: No Typology Code available for payment source

## 2022-09-23 DIAGNOSIS — Z7901 Long term (current) use of anticoagulants: Secondary | ICD-10-CM

## 2022-09-23 LAB — POCT INR: INR: 1.9 — AB (ref 2.0–3.0)

## 2022-10-07 ENCOUNTER — Other Ambulatory Visit (INDEPENDENT_AMBULATORY_CARE_PROVIDER_SITE_OTHER): Payer: No Typology Code available for payment source

## 2022-10-07 DIAGNOSIS — Z7901 Long term (current) use of anticoagulants: Secondary | ICD-10-CM

## 2022-10-07 LAB — POCT INR: INR: 1.9 — AB (ref 2.0–3.0)

## 2022-10-14 ENCOUNTER — Other Ambulatory Visit (INDEPENDENT_AMBULATORY_CARE_PROVIDER_SITE_OTHER): Payer: No Typology Code available for payment source

## 2022-10-14 DIAGNOSIS — Z7901 Long term (current) use of anticoagulants: Secondary | ICD-10-CM

## 2022-10-14 LAB — POCT INR: INR: 2 (ref 2.0–3.0)

## 2022-10-16 ENCOUNTER — Encounter: Payer: Self-pay | Admitting: Family Medicine

## 2022-10-16 ENCOUNTER — Ambulatory Visit: Payer: No Typology Code available for payment source | Admitting: Family Medicine

## 2022-10-16 VITALS — BP 102/62 | HR 98 | Wt 150.0 lb

## 2022-10-16 DIAGNOSIS — R5383 Other fatigue: Secondary | ICD-10-CM | POA: Diagnosis not present

## 2022-10-16 DIAGNOSIS — G501 Atypical facial pain: Secondary | ICD-10-CM

## 2022-10-16 DIAGNOSIS — G5601 Carpal tunnel syndrome, right upper limb: Secondary | ICD-10-CM | POA: Diagnosis not present

## 2022-10-16 NOTE — Patient Instructions (Signed)
Evidently insurance will have to approve your CT scan.  My nurse will call you with that once it is approved.  I will send you a note about your blood work. We gave you a cock up wrist splint for your right hand.  Please wear it at night for the next 4 to 6 weeks and then let me know if you are having improvement.  Great to see you!

## 2022-10-17 LAB — COMPREHENSIVE METABOLIC PANEL
ALT: 24 IU/L (ref 0–32)
AST: 28 IU/L (ref 0–40)
Albumin: 3.7 g/dL — ABNORMAL LOW (ref 3.9–4.9)
Alkaline Phosphatase: 80 IU/L (ref 44–121)
BUN/Creatinine Ratio: 33 — ABNORMAL HIGH (ref 12–28)
BUN: 18 mg/dL (ref 8–27)
Bilirubin Total: <0.2 mg/dL (ref 0.0–1.2)
CO2: 24 mmol/L (ref 20–29)
Calcium: 8.9 mg/dL (ref 8.7–10.3)
Chloride: 98 mmol/L (ref 96–106)
Creatinine, Ser: 0.54 mg/dL — ABNORMAL LOW (ref 0.57–1.00)
Globulin, Total: 2.3 g/dL (ref 1.5–4.5)
Glucose: 88 mg/dL (ref 70–99)
Potassium: 4.5 mmol/L (ref 3.5–5.2)
Sodium: 134 mmol/L (ref 134–144)
Total Protein: 6 g/dL (ref 6.0–8.5)
eGFR: 99 mL/min/{1.73_m2} (ref >59)

## 2022-10-17 LAB — CBC
Hematocrit: 31.2 % — ABNORMAL LOW (ref 34.0–46.6)
Hemoglobin: 9.1 g/dL — ABNORMAL LOW (ref 11.1–15.9)
MCH: 21.7 pg — ABNORMAL LOW (ref 26.6–33.0)
MCHC: 29.2 g/dL — ABNORMAL LOW (ref 31.5–35.7)
MCV: 74 fL — ABNORMAL LOW (ref 79–97)
Platelets: 345 10*3/uL (ref 150–450)
RBC: 4.2 x10E6/uL (ref 3.77–5.28)
RDW: 14.3 % (ref 11.7–15.4)
WBC: 5 10*3/uL (ref 3.4–10.8)

## 2022-10-17 LAB — TSH: TSH: 2.52 u[IU]/mL (ref 0.450–4.500)

## 2022-10-17 LAB — SEDIMENTATION RATE: Sed Rate: 7 mm/h (ref 0–40)

## 2022-10-17 LAB — VITAMIN B12: Vitamin B-12: 174 pg/mL — ABNORMAL LOW (ref 232–1245)

## 2022-10-17 NOTE — Progress Notes (Signed)
    CHIEF COMPLAINT / HPI: #1.  Having right hand numbness with certain activities.  Seems worse when she first wakes up and if she is using her phone a lot or playing her video games on her phone.  Has noticed it also with some yard work.  Tingling is mostly in the thumb and first 2 fingers. #2.  Having some pain in the right maxillary/mandibular area.  Her right upper gum feels like it has a hole in it.  She bends over the pain gets worse.  This has been getting worse over the last 3 weeks.  No cough.  Says it feels like a toothache. 3.  Fatigue: Says she is just had less energy than she thinks she should.  Wonders if she needs to get some blood work.  She has also been eating a lot of ice.   PERTINENT  PMH / PSH: I have reviewed the patient's medications, allergies, past medical and surgical history, smoking status and updated in the EMR as appropriate.   OBJECTIVE:  BP 102/62   Pulse 98   Wt 150 lb (68 kg)   SpO2 98%   BMI 30.82 kg/m  GENERAL: Well-developed female no acute distress WRIST: Right.  Positive Tinel, positive Phalen.  No thenar atrophy. CARDIOVASCULAR: Regular rate and rhythm HEENT: TMs bilaterally have good landmarks.  She has some tenderness to percussion over the right maxillary area and over the right upper gum.  No sign of abscess.  Neck is without lymphadenopathy.  ASSESSMENT / PLAN: #1.  Concern for carpal tunnel syndrome.  We will start with cock-up splint and see her back in 3 to 4 weeks. 2.  Concern for sinus abscess or gum abscess,.  Will get CT scan of the face. 3.  Fatigue: Will check some lab work for her follow-up.  No problem-specific Assessment & Plan notes found for this encounter.   Denny Levy MD

## 2022-10-22 ENCOUNTER — Other Ambulatory Visit (INDEPENDENT_AMBULATORY_CARE_PROVIDER_SITE_OTHER): Payer: No Typology Code available for payment source

## 2022-10-22 DIAGNOSIS — Z7901 Long term (current) use of anticoagulants: Secondary | ICD-10-CM

## 2022-10-22 LAB — POCT INR: INR: 2.1 (ref 2.0–3.0)

## 2022-10-24 ENCOUNTER — Encounter: Payer: Self-pay | Admitting: Family Medicine

## 2022-10-29 ENCOUNTER — Other Ambulatory Visit: Payer: Self-pay | Admitting: Family Medicine

## 2022-10-29 DIAGNOSIS — D509 Iron deficiency anemia, unspecified: Secondary | ICD-10-CM

## 2022-10-29 DIAGNOSIS — D510 Vitamin B12 deficiency anemia due to intrinsic factor deficiency: Secondary | ICD-10-CM

## 2022-10-29 MED ORDER — CYANOCOBALAMIN 1000 MCG/ML IJ SOLN
1000.0000 ug | INTRAMUSCULAR | Status: AC
Start: 2022-10-30 — End: 2023-04-27
  Administered 2022-11-13 – 2023-04-17 (×5): 1000 ug via INTRAMUSCULAR

## 2022-10-29 NOTE — Progress Notes (Signed)
  Via phone call today, I discussed treatment options with patient. I have ordered Feraheme 510 mg X1 . I discussed the indications, risks, and benefits with the patient.  Nursing team, please call to schedule infusion.  [phone] Please let patient know time and date of appointment.  Denny Levy MD  [facility]

## 2022-10-29 NOTE — Progress Notes (Unsigned)
I have spoken with [name] and she is in agreement to receive monthly vitamin B 12 injections. I have placed the order below. I told her to call for a nurse appt to get her injection. THANKS! Denny Levy

## 2022-11-01 NOTE — Progress Notes (Signed)
Spoke with patient informed of infusion at [address] [place]. Fri. Sept. 20th at 10:00am. Patient understood.  Aquilla Solian, CMA

## 2022-11-02 ENCOUNTER — Encounter: Payer: Self-pay | Admitting: Family Medicine

## 2022-11-04 ENCOUNTER — Other Ambulatory Visit (INDEPENDENT_AMBULATORY_CARE_PROVIDER_SITE_OTHER): Payer: No Typology Code available for payment source

## 2022-11-04 ENCOUNTER — Ambulatory Visit
Admission: RE | Admit: 2022-11-04 | Discharge: 2022-11-04 | Disposition: A | Discharge status: Home / Self Care | Payer: No Typology Code available for payment source | Source: Ambulatory Visit | Attending: Family Medicine | Admitting: Family Medicine

## 2022-11-04 DIAGNOSIS — Z7901 Long term (current) use of anticoagulants: Secondary | ICD-10-CM | POA: Diagnosis not present

## 2022-11-04 DIAGNOSIS — G501 Atypical facial pain: Secondary | ICD-10-CM

## 2022-11-04 DIAGNOSIS — R519 Headache, unspecified: Secondary | ICD-10-CM | POA: Diagnosis not present

## 2022-11-04 LAB — POCT INR: INR: 2 (ref 2.0–3.0)

## 2022-11-04 MED ORDER — IOPAMIDOL (ISOVUE-300) INJECTION 61%
500.0000 mL | Freq: Once | INTRAVENOUS | Status: AC | PRN
  Administered 2022-11-04: 80 mL via INTRAVENOUS

## 2022-11-08 ENCOUNTER — Ambulatory Visit (HOSPITAL_COMMUNITY)
Admission: RE | Admit: 2022-11-08 | Discharge: 2022-11-08 | Disposition: A | Discharge status: Home / Self Care | Payer: No Typology Code available for payment source | Source: Ambulatory Visit | Attending: Family Medicine

## 2022-11-08 DIAGNOSIS — D509 Iron deficiency anemia, unspecified: Secondary | ICD-10-CM | POA: Insufficient documentation

## 2022-11-08 MED ORDER — SODIUM CHLORIDE 0.9 % IV SOLN
510.0000 mg | Freq: Once | INTRAVENOUS | Status: AC
  Administered 2022-11-08: 510 mg via INTRAVENOUS
  Filled 2022-11-08: qty 510

## 2022-11-11 ENCOUNTER — Other Ambulatory Visit: Payer: Self-pay | Admitting: Family Medicine

## 2022-11-11 ENCOUNTER — Encounter: Payer: Self-pay | Admitting: Family Medicine

## 2022-11-13 ENCOUNTER — Ambulatory Visit (INDEPENDENT_AMBULATORY_CARE_PROVIDER_SITE_OTHER): Payer: No Typology Code available for payment source

## 2022-11-13 DIAGNOSIS — D510 Vitamin B12 deficiency anemia due to intrinsic factor deficiency: Secondary | ICD-10-CM

## 2022-11-15 NOTE — Progress Notes (Cosign Needed Addendum)
Pt is here for a b12 injection today.    Date of last office visit that b12 was discussed 10/16/2022. Dr. Jennette Kettle called and spoke with patient regarding initiation of B12 injections on 10/29/22.  Injection given in left deltoid, pt tolerated well and has scheduled follow up nurse visit on 12/13/22.  *Of note, patient received iron infusion on 9/20. She had concerns about bruising and knot that formed after this. Dr. McDiarmid came and evaluated area. Per Dr. Perley Jain, appears to be normal bruising post IV for iron infusion. No red flags at this time. Provided with return precautions.    Veronda Prude, RN

## 2022-11-19 NOTE — Progress Notes (Signed)
Reviewed and agree.

## 2022-11-21 ENCOUNTER — Other Ambulatory Visit: Payer: Self-pay | Admitting: Family Medicine

## 2022-11-21 DIAGNOSIS — Z7901 Long term (current) use of anticoagulants: Secondary | ICD-10-CM

## 2022-11-23 ENCOUNTER — Encounter: Payer: Self-pay | Admitting: Family Medicine

## 2022-11-25 ENCOUNTER — Other Ambulatory Visit: Payer: Self-pay | Admitting: Family Medicine

## 2022-11-25 ENCOUNTER — Other Ambulatory Visit: Payer: No Typology Code available for payment source

## 2022-11-25 DIAGNOSIS — Z7901 Long term (current) use of anticoagulants: Secondary | ICD-10-CM

## 2022-11-25 DIAGNOSIS — Z1231 Encounter for screening mammogram for malignant neoplasm of breast: Secondary | ICD-10-CM

## 2022-11-25 DIAGNOSIS — I10 Essential (primary) hypertension: Secondary | ICD-10-CM

## 2022-11-25 LAB — POCT INR: INR: 2 (ref 2.0–3.0)

## 2022-11-25 MED ORDER — WARFARIN SODIUM 2 MG PO TABS: 6.0000 mg | ORAL_TABLET | Freq: Every day | ORAL | 3 refills | Status: DC

## 2022-11-25 NOTE — Progress Notes (Signed)
Reviewed and agree with Dr Koval's plan.   

## 2022-12-03 ENCOUNTER — Other Ambulatory Visit: Payer: Self-pay

## 2022-12-03 MED ORDER — DICLOFENAC SODIUM 75 MG PO TBEC: DELAYED_RELEASE_TABLET | ORAL | 3 refills | Status: DC

## 2022-12-11 ENCOUNTER — Other Ambulatory Visit (INDEPENDENT_AMBULATORY_CARE_PROVIDER_SITE_OTHER): Payer: No Typology Code available for payment source

## 2022-12-11 ENCOUNTER — Ambulatory Visit (INDEPENDENT_AMBULATORY_CARE_PROVIDER_SITE_OTHER): Payer: No Typology Code available for payment source

## 2022-12-11 DIAGNOSIS — Z7901 Long term (current) use of anticoagulants: Secondary | ICD-10-CM | POA: Diagnosis not present

## 2022-12-11 DIAGNOSIS — D510 Vitamin B12 deficiency anemia due to intrinsic factor deficiency: Secondary | ICD-10-CM

## 2022-12-11 LAB — POCT INR: INR: 2.5 (ref 2.0–3.0)

## 2022-12-11 NOTE — Progress Notes (Signed)
Pt is here for a b12 injection today.    Date of last office visit that b12 was discussed 10/16/2022  Last injection was 11/13/2022  Injection given in right deltoid, pt tolerated well and scheduled for 01/08/23 at 9:30.  Veronda Prude, RN

## 2022-12-12 ENCOUNTER — Ambulatory Visit
Admission: RE | Admit: 2022-12-12 | Discharge: 2022-12-12 | Disposition: A | Discharge status: Home / Self Care | Payer: No Typology Code available for payment source | Source: Ambulatory Visit

## 2022-12-12 DIAGNOSIS — Z1231 Encounter for screening mammogram for malignant neoplasm of breast: Secondary | ICD-10-CM | POA: Diagnosis not present

## 2022-12-13 ENCOUNTER — Ambulatory Visit: Payer: Self-pay

## 2022-12-16 ENCOUNTER — Other Ambulatory Visit: Payer: No Typology Code available for payment source

## 2022-12-21 ENCOUNTER — Other Ambulatory Visit: Payer: Self-pay | Admitting: Family Medicine

## 2022-12-25 ENCOUNTER — Ambulatory Visit: Payer: No Typology Code available for payment source | Admitting: Family Medicine

## 2022-12-25 ENCOUNTER — Other Ambulatory Visit: Payer: Self-pay

## 2022-12-25 ENCOUNTER — Other Ambulatory Visit: Payer: No Typology Code available for payment source

## 2022-12-25 VITALS — BP 117/61 | HR 68 | Ht <= 58 in | Wt 146.8 lb

## 2022-12-25 DIAGNOSIS — L4050 Arthropathic psoriasis, unspecified: Secondary | ICD-10-CM | POA: Diagnosis not present

## 2022-12-25 DIAGNOSIS — R5383 Other fatigue: Secondary | ICD-10-CM | POA: Diagnosis not present

## 2022-12-25 DIAGNOSIS — E785 Hyperlipidemia, unspecified: Secondary | ICD-10-CM

## 2022-12-25 DIAGNOSIS — F410 Panic disorder [episodic paroxysmal anxiety] without agoraphobia: Secondary | ICD-10-CM

## 2022-12-25 DIAGNOSIS — D509 Iron deficiency anemia, unspecified: Secondary | ICD-10-CM

## 2022-12-25 DIAGNOSIS — Z7901 Long term (current) use of anticoagulants: Secondary | ICD-10-CM | POA: Diagnosis not present

## 2022-12-25 DIAGNOSIS — E538 Deficiency of other specified B group vitamins: Secondary | ICD-10-CM

## 2022-12-25 MED ORDER — WARFARIN SODIUM 2 MG PO TABS: ORAL_TABLET | ORAL | 3 refills | Status: DC

## 2022-12-25 MED ORDER — DICLOFENAC SODIUM 75 MG PO TBEC: DELAYED_RELEASE_TABLET | ORAL | 3 refills | Status: DC

## 2022-12-25 NOTE — Patient Instructions (Signed)
I will plan onseeing you next year!

## 2022-12-26 ENCOUNTER — Encounter: Payer: Self-pay | Admitting: Family Medicine

## 2022-12-26 ENCOUNTER — Telehealth: Payer: Self-pay | Admitting: *Deleted

## 2022-12-26 LAB — CBC
Hematocrit: 38.2 % (ref 34.0–46.6)
Hemoglobin: 11.8 g/dL (ref 11.1–15.9)
MCH: 25.1 pg — ABNORMAL LOW (ref 26.6–33.0)
MCHC: 30.9 g/dL — ABNORMAL LOW (ref 31.5–35.7)
MCV: 81 fL (ref 79–97)
Platelets: 275 10*3/uL (ref 150–450)
RBC: 4.7 x10E6/uL (ref 3.77–5.28)
WBC: 5.6 10*3/uL (ref 3.4–10.8)

## 2022-12-26 LAB — PROTIME-INR
INR: 1.5 — ABNORMAL HIGH (ref 0.9–1.2)
Prothrombin Time: 16 s — ABNORMAL HIGH (ref 9.1–12.0)

## 2022-12-26 LAB — LDL CHOLESTEROL, DIRECT: LDL Direct: 125 mg/dL — ABNORMAL HIGH (ref 0–99)

## 2022-12-26 LAB — VITAMIN B12: Vitamin B-12: 563 pg/mL (ref 232–1245)

## 2022-12-26 NOTE — Assessment & Plan Note (Signed)
Will recheck INR this afternoon and adjust accordingly.  I wish we could get her back on a DOAC but financially that is not feasible for her right now.

## 2022-12-26 NOTE — Assessment & Plan Note (Addendum)
Will recheck your vitamin B12 level today.

## 2022-12-26 NOTE — Progress Notes (Signed)
CHIEF COMPLAINT / HPI: #1.  Follow-up fatigue: Had iron infusion.  Feels some better but still not as energetic as she was a few months ago.  Agrees that this time of year she frequently has a little bit less energy and that may be contributing.  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 2.  Appetite seems down recently.  Some days she will be pretty hungry and eat well and other days she will not.  Does not feel like she is depressed.  Believes her current medication regimen is working pretty well especially for panic.  Usually this time of year she does have a little increase in her anxiety and that has occurred but no panic attacks and she is very grateful for that. 3.  Her GI doctor had asked her to try taking only 1 Voltaren tablet a day and she has done that for a while but feels like her joint pain is not well-controlled with that.  This may be contributing to her fatigue in her estimation.  She would like to go back to 2 a day if she needs them.  She knows what to watch for regarding stomach upset.  Also aware that she is on warfarin. 4.  Chronic anticoagulation: Did well on a DOAC but is unable to afford it.  Having a little trouble controlling her INR recently and here for recheck again today.  We recently increased her to 8 mg a day from 6.   PERTINENT  PMH / PSH: I have reviewed the patient's medications, allergies, past medical and surgical history, smoking status and updated in the EMR as appropriate.   OBJECTIVE:  BP 117/61   Pulse 68   Ht 4\' 10"  (1.473 m)   Wt 146 lb 12.8 oz (66.6 kg)   SpO2 100%   BMI 30.68 kg/m  GENERAL Well-developed, no acute distress. CV: Regular rate and rhythm LUNGS: No increased work of breathing PSYCH: AxOx4. Good eye contact.. No psychomotor retardation or agitation. Appropriate speech fluency and content. Asks and answers questions appropriately. Mood is congruent.   ASSESSMENT /  PLAN:   Other fatigue Sounds like she has had improvement in her fatigue.  I think the iron infusion was likely beneficial.  Will check labs for hemoglobin today.  Psoriatic arthritis (HCC) She has done well taking both warfarin and diclofenac for several years now.  I appreciate and agree with the thought that taking less of that is probably better overall but I do not want her to have uncontrolled pain.  That may be contributing to her fatigue.  Will refill her diclofenac so she can take it twice daily if needed and she understands to be judicious about this dosing.  She also understands red flags of GI upset, dark tarry stools or sudden increase in her fatigue.  Vitamin B 12 deficiency Will recheck your vitamin B12 level today.  PANIC ATTACKS Doing well on her current medicines and will make no change in her regimen at this time.  Should she develop increasing symptoms of either anxiety or depression, she will let me know.  This has been a successful regimen for her for several years now.  Chronic anticoagulation Will recheck INR this afternoon and adjust accordingly.  I wish we could get her back on a DOAC but financially that is not feasible for her right now.   Denny Levy MD

## 2022-12-26 NOTE — Telephone Encounter (Signed)
Called patient to discuss INR results. She is subtherapeutic at 1.5. Since she has been at therapeutic level the last couple of months on her current dose, we will leave her at 8 mg daily and recheck INR Tuesday of next week. Patient verbalizes understanding, PCP aware of result and plan. [name]

## 2022-12-26 NOTE — Assessment & Plan Note (Signed)
Sounds like she has had improvement in her fatigue.  I think the iron infusion was likely beneficial.  Will check labs for hemoglobin today.

## 2022-12-26 NOTE — Assessment & Plan Note (Signed)
She has done well taking both warfarin and diclofenac for several years now.  I appreciate and agree with the thought that taking less of that is probably better overall but I do not want her to have uncontrolled pain.  That may be contributing to her fatigue.  Will refill her diclofenac so she can take it twice daily if needed and she understands to be judicious about this dosing.  She also understands red flags of GI upset, dark tarry stools or sudden increase in her fatigue.

## 2022-12-26 NOTE — Assessment & Plan Note (Signed)
Doing well on her current medicines and will make no change in her regimen at this time.  Should she develop increasing symptoms of either anxiety or depression, she will let me know.  This has been a successful regimen for her for several years now.

## 2022-12-31 ENCOUNTER — Other Ambulatory Visit: Payer: No Typology Code available for payment source

## 2022-12-31 ENCOUNTER — Telehealth: Payer: Self-pay | Admitting: *Deleted

## 2022-12-31 DIAGNOSIS — Z7901 Long term (current) use of anticoagulants: Secondary | ICD-10-CM | POA: Diagnosis not present

## 2022-12-31 LAB — POCT INR: INR: 1.9 — AB (ref 2.0–3.0)

## 2022-12-31 NOTE — Telephone Encounter (Signed)
Called patient to reschedule INR check from next Tuesday to next Wednesday, she has a nurse visit already scheduled on Wed. [name]

## 2023-01-08 ENCOUNTER — Ambulatory Visit: Payer: No Typology Code available for payment source

## 2023-01-08 DIAGNOSIS — Z7901 Long term (current) use of anticoagulants: Secondary | ICD-10-CM | POA: Diagnosis not present

## 2023-01-08 DIAGNOSIS — E538 Deficiency of other specified B group vitamins: Secondary | ICD-10-CM | POA: Diagnosis not present

## 2023-01-08 LAB — POCT INR: INR: 3.4 — AB (ref 2.0–3.0)

## 2023-01-08 MED ORDER — CYANOCOBALAMIN 1000 MCG/ML IJ SOLN
1000.0000 ug | Freq: Once | INTRAMUSCULAR | Status: AC
  Administered 2023-01-08: 1000 ug via INTRAMUSCULAR

## 2023-01-08 NOTE — Progress Notes (Unsigned)
Pt is here for a b12 injection today.     Date of last office visit that b12 was discussed 10/16/2022.   Last injection was 12/11/2022.   Injection given in left deltoid, pt tolerated well and scheduled for 02/10/2023.

## 2023-01-15 ENCOUNTER — Other Ambulatory Visit: Payer: No Typology Code available for payment source

## 2023-01-15 DIAGNOSIS — Z7901 Long term (current) use of anticoagulants: Secondary | ICD-10-CM

## 2023-01-15 LAB — POCT INR: INR: 1.5 — AB (ref 2.0–3.0)

## 2023-01-22 ENCOUNTER — Other Ambulatory Visit (INDEPENDENT_AMBULATORY_CARE_PROVIDER_SITE_OTHER): Payer: No Typology Code available for payment source

## 2023-01-22 DIAGNOSIS — Z7901 Long term (current) use of anticoagulants: Secondary | ICD-10-CM

## 2023-01-22 LAB — POCT INR: INR: 1.7 — AB (ref 2.0–3.0)

## 2023-01-30 ENCOUNTER — Other Ambulatory Visit: Payer: No Typology Code available for payment source

## 2023-01-30 DIAGNOSIS — Z7901 Long term (current) use of anticoagulants: Secondary | ICD-10-CM

## 2023-01-30 LAB — POCT INR: INR: 1.6 — AB (ref 2.0–3.0)

## 2023-02-06 ENCOUNTER — Other Ambulatory Visit: Payer: No Typology Code available for payment source

## 2023-02-06 DIAGNOSIS — Z7901 Long term (current) use of anticoagulants: Secondary | ICD-10-CM | POA: Diagnosis not present

## 2023-02-06 LAB — POCT INR: INR: 2 (ref 2.0–3.0)

## 2023-02-10 ENCOUNTER — Ambulatory Visit: Payer: No Typology Code available for payment source

## 2023-02-13 ENCOUNTER — Other Ambulatory Visit: Payer: No Typology Code available for payment source

## 2023-02-13 ENCOUNTER — Ambulatory Visit: Payer: No Typology Code available for payment source

## 2023-02-13 DIAGNOSIS — Z7901 Long term (current) use of anticoagulants: Secondary | ICD-10-CM

## 2023-02-13 DIAGNOSIS — E538 Deficiency of other specified B group vitamins: Secondary | ICD-10-CM | POA: Diagnosis not present

## 2023-02-13 DIAGNOSIS — D510 Vitamin B12 deficiency anemia due to intrinsic factor deficiency: Secondary | ICD-10-CM | POA: Diagnosis not present

## 2023-02-13 LAB — POCT INR: INR: 2.1 (ref 2.0–3.0)

## 2023-02-13 NOTE — Progress Notes (Signed)
Pt is here for a b12 injection today.    Date of last office visit that b12 was discussed 12/25/2022  Last injection was 01/08/2023  Injection given in right deltoid, pt tolerated well and will scheduled for next injection on 03/17/23.  Veronda Prude, RN

## 2023-02-15 ENCOUNTER — Other Ambulatory Visit: Payer: Self-pay | Admitting: Medical Genetics

## 2023-02-27 ENCOUNTER — Other Ambulatory Visit (INDEPENDENT_AMBULATORY_CARE_PROVIDER_SITE_OTHER): Payer: No Typology Code available for payment source

## 2023-02-27 DIAGNOSIS — Z7901 Long term (current) use of anticoagulants: Secondary | ICD-10-CM

## 2023-02-27 LAB — POCT INR: INR: 1.7 — AB (ref 2.0–3.0)

## 2023-03-06 ENCOUNTER — Other Ambulatory Visit (INDEPENDENT_AMBULATORY_CARE_PROVIDER_SITE_OTHER): Payer: Self-pay

## 2023-03-06 DIAGNOSIS — Z7901 Long term (current) use of anticoagulants: Secondary | ICD-10-CM

## 2023-03-06 LAB — POCT INR: INR: 2.6 (ref 2.0–3.0)

## 2023-03-11 DIAGNOSIS — K5289 Other specified noninfective gastroenteritis and colitis: Secondary | ICD-10-CM | POA: Diagnosis not present

## 2023-03-11 DIAGNOSIS — K219 Gastro-esophageal reflux disease without esophagitis: Secondary | ICD-10-CM | POA: Diagnosis not present

## 2023-03-13 ENCOUNTER — Other Ambulatory Visit: Payer: Self-pay

## 2023-03-13 DIAGNOSIS — Z7901 Long term (current) use of anticoagulants: Secondary | ICD-10-CM

## 2023-03-13 LAB — POCT INR: INR: 2.4 (ref 2.0–3.0)

## 2023-03-14 ENCOUNTER — Encounter: Payer: Self-pay | Admitting: Family Medicine

## 2023-03-17 ENCOUNTER — Ambulatory Visit: Payer: No Typology Code available for payment source

## 2023-03-17 DIAGNOSIS — D510 Vitamin B12 deficiency anemia due to intrinsic factor deficiency: Secondary | ICD-10-CM

## 2023-03-17 DIAGNOSIS — E538 Deficiency of other specified B group vitamins: Secondary | ICD-10-CM

## 2023-03-17 NOTE — Progress Notes (Signed)
Pt is here for a b12 injection today.    Date of last office visit that b12 was discussed 12/25/2022  Last injection was 02/13/2023  Injection given in left deltoid, pt tolerated well and scheduled for nurse visit on 04/17/23 for next injection.   Veronda Prude, RN

## 2023-03-21 ENCOUNTER — Other Ambulatory Visit (HOSPITAL_COMMUNITY)
Admission: RE | Admit: 2023-03-21 | Discharge: 2023-03-21 | Disposition: A | Discharge status: Home / Self Care | Payer: Self-pay | Source: Ambulatory Visit | Attending: Oncology | Admitting: Oncology

## 2023-03-26 DIAGNOSIS — I7300 Raynaud's syndrome without gangrene: Secondary | ICD-10-CM | POA: Diagnosis not present

## 2023-03-26 DIAGNOSIS — M1991 Primary osteoarthritis, unspecified site: Secondary | ICD-10-CM | POA: Diagnosis not present

## 2023-03-26 DIAGNOSIS — M310 Hypersensitivity angiitis: Secondary | ICD-10-CM | POA: Diagnosis not present

## 2023-03-26 DIAGNOSIS — L409 Psoriasis, unspecified: Secondary | ICD-10-CM | POA: Diagnosis not present

## 2023-03-26 DIAGNOSIS — Z6830 Body mass index (BMI) 30.0-30.9, adult: Secondary | ICD-10-CM | POA: Diagnosis not present

## 2023-03-26 DIAGNOSIS — L4050 Arthropathic psoriasis, unspecified: Secondary | ICD-10-CM | POA: Diagnosis not present

## 2023-03-26 DIAGNOSIS — E669 Obesity, unspecified: Secondary | ICD-10-CM | POA: Diagnosis not present

## 2023-03-27 ENCOUNTER — Other Ambulatory Visit: Payer: HMO

## 2023-03-27 DIAGNOSIS — Z7901 Long term (current) use of anticoagulants: Secondary | ICD-10-CM

## 2023-03-27 LAB — POCT INR: INR: 3 (ref 2.0–3.0)

## 2023-04-03 ENCOUNTER — Other Ambulatory Visit: Payer: Self-pay | Admitting: Family Medicine

## 2023-04-03 ENCOUNTER — Other Ambulatory Visit: Payer: HMO

## 2023-04-03 DIAGNOSIS — Z7901 Long term (current) use of anticoagulants: Secondary | ICD-10-CM

## 2023-04-03 LAB — GENECONNECT MOLECULAR SCREEN: Genetic Analysis Overall Interpretation: NEGATIVE

## 2023-04-03 LAB — POCT INR: INR: 2.7 (ref 2.0–3.0)

## 2023-04-17 ENCOUNTER — Other Ambulatory Visit (INDEPENDENT_AMBULATORY_CARE_PROVIDER_SITE_OTHER): Payer: HMO

## 2023-04-17 ENCOUNTER — Ambulatory Visit (INDEPENDENT_AMBULATORY_CARE_PROVIDER_SITE_OTHER): Payer: HMO

## 2023-04-17 DIAGNOSIS — D510 Vitamin B12 deficiency anemia due to intrinsic factor deficiency: Secondary | ICD-10-CM

## 2023-04-17 DIAGNOSIS — E538 Deficiency of other specified B group vitamins: Secondary | ICD-10-CM

## 2023-04-17 DIAGNOSIS — Z7901 Long term (current) use of anticoagulants: Secondary | ICD-10-CM | POA: Diagnosis not present

## 2023-04-17 LAB — POCT INR: INR: 2.2 (ref 2.0–3.0)

## 2023-04-17 NOTE — Addendum Note (Signed)
 Addended by: Jennette Bill on: 04/17/2023 10:02 AM   Modules accepted: Level of Service

## 2023-04-21 NOTE — Progress Notes (Signed)
 Pt is here for a b12 injection today.    Date of last office visit that b12 was discussed 12/25/2022  Last injection was 03/17/2023  Injection given in right deltoid, pt tolerated well and scheduled next visit on 05/15/23.   Veronda Prude, RN

## 2023-05-01 ENCOUNTER — Other Ambulatory Visit: Payer: HMO

## 2023-05-01 DIAGNOSIS — Z7901 Long term (current) use of anticoagulants: Secondary | ICD-10-CM | POA: Diagnosis not present

## 2023-05-01 LAB — POCT INR: INR: 2.5 (ref 2.0–3.0)

## 2023-05-09 ENCOUNTER — Other Ambulatory Visit: Payer: Self-pay | Admitting: Family Medicine

## 2023-05-09 DIAGNOSIS — G629 Polyneuropathy, unspecified: Secondary | ICD-10-CM

## 2023-05-09 DIAGNOSIS — I10 Essential (primary) hypertension: Secondary | ICD-10-CM

## 2023-05-15 ENCOUNTER — Other Ambulatory Visit

## 2023-05-15 ENCOUNTER — Ambulatory Visit: Payer: HMO

## 2023-05-15 DIAGNOSIS — Z7901 Long term (current) use of anticoagulants: Secondary | ICD-10-CM

## 2023-05-15 DIAGNOSIS — E538 Deficiency of other specified B group vitamins: Secondary | ICD-10-CM

## 2023-05-15 LAB — POCT INR: INR: 2.9 (ref 2.0–3.0)

## 2023-05-15 MED ORDER — CYANOCOBALAMIN 1000 MCG/ML IJ SOLN
1000.0000 ug | Freq: Once | INTRAMUSCULAR | Status: AC
  Administered 2023-05-15: 1000 ug via INTRAMUSCULAR

## 2023-05-15 NOTE — Progress Notes (Signed)
 Patient presents to nurse clinic for B12 injection.   Last injection was 04/17/2023.  Injection given in LD without complication.   Patient scheduled for PCP apt to discuss continuation of B12 for 06/11/2023.

## 2023-05-22 ENCOUNTER — Other Ambulatory Visit

## 2023-05-22 DIAGNOSIS — Z7901 Long term (current) use of anticoagulants: Secondary | ICD-10-CM

## 2023-05-22 LAB — POCT INR: INR: 3.4 — AB (ref 2.0–3.0)

## 2023-05-29 ENCOUNTER — Other Ambulatory Visit

## 2023-05-29 DIAGNOSIS — Z7901 Long term (current) use of anticoagulants: Secondary | ICD-10-CM | POA: Diagnosis not present

## 2023-05-29 LAB — POCT INR: INR: 3.9 — AB (ref 2.0–3.0)

## 2023-06-05 ENCOUNTER — Ambulatory Visit: Payer: Self-pay

## 2023-06-05 ENCOUNTER — Other Ambulatory Visit (INDEPENDENT_AMBULATORY_CARE_PROVIDER_SITE_OTHER)

## 2023-06-05 ENCOUNTER — Other Ambulatory Visit: Payer: Self-pay

## 2023-06-05 DIAGNOSIS — Z7901 Long term (current) use of anticoagulants: Secondary | ICD-10-CM | POA: Diagnosis not present

## 2023-06-05 LAB — POCT INR: INR: 4 — AB (ref 2.0–3.0)

## 2023-06-11 ENCOUNTER — Other Ambulatory Visit

## 2023-06-11 ENCOUNTER — Ambulatory Visit (INDEPENDENT_AMBULATORY_CARE_PROVIDER_SITE_OTHER): Admitting: Family Medicine

## 2023-06-11 VITALS — BP 109/56 | HR 65 | Ht <= 58 in | Wt 151.8 lb

## 2023-06-11 DIAGNOSIS — G5603 Carpal tunnel syndrome, bilateral upper limbs: Secondary | ICD-10-CM | POA: Diagnosis not present

## 2023-06-11 DIAGNOSIS — M25551 Pain in right hip: Secondary | ICD-10-CM

## 2023-06-11 DIAGNOSIS — Z7901 Long term (current) use of anticoagulants: Secondary | ICD-10-CM

## 2023-06-11 DIAGNOSIS — F339 Major depressive disorder, recurrent, unspecified: Secondary | ICD-10-CM | POA: Diagnosis not present

## 2023-06-11 LAB — POCT INR: INR: 3.5 — AB (ref 2.0–3.0)

## 2023-06-11 MED ORDER — QUETIAPINE FUMARATE 25 MG PO TABS: 25.0000 mg | ORAL_TABLET | Freq: Every day | ORAL | 1 refills | Status: DC

## 2023-06-11 NOTE — Patient Instructions (Signed)
 Carpal tunnel brace: Featol Lets start the seroquel  and let me see you in about a month. MyChart me with updates in the meantime.

## 2023-06-12 DIAGNOSIS — F339 Major depressive disorder, recurrent, unspecified: Secondary | ICD-10-CM | POA: Insufficient documentation

## 2023-06-12 NOTE — Progress Notes (Signed)
    CHIEF COMPLAINT / HPI: #1.  Wants to talk about depression medicine.  Not doing as well for her right now.  More emotionally labile.  Irritable particularly with her husband.  Has done quite well on this medicine but is at max dose.  Wants to either switch or add her something.  Denies suicidal or homicidal ideation.  Sleep is okay. 2.  Right hip pain for the last couple of months.  Interfering with activities that she wants to do.  Does not bother her at night but if she is up and moving around it bothers her all the time.  5-6 out of 10.  Nothing has seemed to help it. 3.  Having carpal tunnel symptoms in both hands now.  Wants to consider using nighttime bracing as that has worked in the past. #4.  Management of anticoagulation.   PERTINENT  PMH / PSH: I have reviewed the patient's medications, allergies, past medical and surgical history, smoking status and updated in the EMR as appropriate.   OBJECTIVE:  BP (!) 109/56   Pulse 65   Ht 4\' 10"  (1.473 m)   Wt 151 lb 12.8 oz (68.9 kg)   SpO2 99%   BMI 31.73 kg/m   Vital signs reviewed. GENERAL: Well-developed, well-nourished, no acute distress. CARDIOVASCULAR: Regular rate and rhythm  MSK: Movement of extremity x 4.  Right hip tender to palpation over the greater trochanteric bursa and this reproduces her pain. PSYCH: AxOx4. Good eye contact.. No psychomotor retardation or agitation. Appropriate speech fluency and content. Asks and answers questions appropriately. Mood is congruent.   PROCEDURE: INJECTION: Patient was given informed consent, signed copy in the chart. Appropriate time out was taken. Area prepped and draped in usual sterile fashion. Ethyl chloride was  used for local anesthesia. A 21 gauge 1 1/2 inch needle was used.. 1 cc of methylprednisolone  40 mg/ml plus  4 cc of 1% lidocaine  without epinephrine  was injected into the right greater trochanteric bursa using a(n) lateral perpendicular  approach.   The patient  tolerated the procedure well. There were no complications. Post procedure instructions were given.   ASSESSMENT / PLAN:   Carpal tunnel syndrome: Agree with nighttime bracing.  We discussed how to best purchase those 2.  Chronic anticoagulation: Will make adjustments in continue to follow in Coumadin  clinic 3.  Depression with anxiety/panic attacks: She has done really well for many years on Effexor .  Will add 25 mg Seroquel  she will let me know in 1 to 2 weeks how she is doing. 4.  Right greater trochanteric bursitis: She had injured her back a while back and I suspect was having some abnormal mechanics.  Will try corticosteroid injection today and she will let me know in 2 to 3 weeks if this is not improving.  Follow-up 1 month.  No problem-specific Assessment & Plan notes found for this encounter.   Violetta Grice MD

## 2023-06-18 ENCOUNTER — Encounter: Payer: Self-pay | Admitting: Family Medicine

## 2023-06-19 ENCOUNTER — Other Ambulatory Visit

## 2023-06-19 DIAGNOSIS — Z7901 Long term (current) use of anticoagulants: Secondary | ICD-10-CM | POA: Diagnosis not present

## 2023-06-19 LAB — POCT INR: INR: 2.5 (ref 2.0–3.0)

## 2023-06-20 ENCOUNTER — Other Ambulatory Visit: Payer: Self-pay | Admitting: Family Medicine

## 2023-06-20 ENCOUNTER — Encounter: Payer: Self-pay | Admitting: Family Medicine

## 2023-06-20 DIAGNOSIS — M25551 Pain in right hip: Secondary | ICD-10-CM

## 2023-06-25 ENCOUNTER — Ambulatory Visit
Admission: RE | Admit: 2023-06-25 | Discharge: 2023-06-25 | Disposition: A | Discharge status: Home / Self Care | Source: Ambulatory Visit | Attending: Family Medicine | Admitting: Family Medicine

## 2023-06-25 DIAGNOSIS — M25551 Pain in right hip: Secondary | ICD-10-CM | POA: Diagnosis not present

## 2023-06-25 DIAGNOSIS — M160 Bilateral primary osteoarthritis of hip: Secondary | ICD-10-CM | POA: Diagnosis not present

## 2023-06-25 DIAGNOSIS — M25552 Pain in left hip: Secondary | ICD-10-CM | POA: Diagnosis not present

## 2023-06-26 ENCOUNTER — Other Ambulatory Visit: Payer: Self-pay | Admitting: Family Medicine

## 2023-06-26 ENCOUNTER — Other Ambulatory Visit

## 2023-06-26 DIAGNOSIS — Z7901 Long term (current) use of anticoagulants: Secondary | ICD-10-CM

## 2023-06-26 LAB — POCT INR: INR: 2.8 (ref 2.0–3.0)

## 2023-07-03 ENCOUNTER — Other Ambulatory Visit: Payer: Self-pay | Admitting: Family Medicine

## 2023-07-03 ENCOUNTER — Other Ambulatory Visit

## 2023-07-03 DIAGNOSIS — Z7901 Long term (current) use of anticoagulants: Secondary | ICD-10-CM | POA: Diagnosis not present

## 2023-07-03 LAB — POCT INR: INR: 2.6 (ref 2.0–3.0)

## 2023-07-11 ENCOUNTER — Ambulatory Visit: Payer: Self-pay | Admitting: Family Medicine

## 2023-07-16 ENCOUNTER — Encounter: Payer: Self-pay | Admitting: Family Medicine

## 2023-07-16 ENCOUNTER — Ambulatory Visit (INDEPENDENT_AMBULATORY_CARE_PROVIDER_SITE_OTHER): Admitting: Family Medicine

## 2023-07-16 VITALS — BP 108/58 | HR 98 | Ht <= 58 in | Wt 151.6 lb

## 2023-07-16 DIAGNOSIS — F410 Panic disorder [episodic paroxysmal anxiety] without agoraphobia: Secondary | ICD-10-CM | POA: Diagnosis not present

## 2023-07-16 DIAGNOSIS — Z7901 Long term (current) use of anticoagulants: Secondary | ICD-10-CM

## 2023-07-16 DIAGNOSIS — R609 Edema, unspecified: Secondary | ICD-10-CM | POA: Diagnosis not present

## 2023-07-16 DIAGNOSIS — R5383 Other fatigue: Secondary | ICD-10-CM

## 2023-07-16 DIAGNOSIS — F339 Major depressive disorder, recurrent, unspecified: Secondary | ICD-10-CM | POA: Diagnosis not present

## 2023-07-16 DIAGNOSIS — I10 Essential (primary) hypertension: Secondary | ICD-10-CM | POA: Diagnosis not present

## 2023-07-16 DIAGNOSIS — F341 Dysthymic disorder: Secondary | ICD-10-CM

## 2023-07-16 LAB — POCT INR: INR: 3.1 — AB (ref 2.0–3.0)

## 2023-07-16 NOTE — Patient Instructions (Signed)
 Let me know in a week or so if the current dose of lasix  is not working as well. Great to see you!

## 2023-07-17 ENCOUNTER — Encounter: Payer: Self-pay | Admitting: Family Medicine

## 2023-07-17 ENCOUNTER — Ambulatory Visit: Payer: Self-pay | Admitting: Family Medicine

## 2023-07-17 DIAGNOSIS — R609 Edema, unspecified: Secondary | ICD-10-CM | POA: Insufficient documentation

## 2023-07-17 LAB — BASIC METABOLIC PANEL WITH GFR
BUN/Creatinine Ratio: 20 (ref 12–28)
BUN: 11 mg/dL (ref 8–27)
CO2: 22 mmol/L (ref 20–29)
Calcium: 8.8 mg/dL (ref 8.7–10.3)
Chloride: 98 mmol/L (ref 96–106)
Creatinine, Ser: 0.54 mg/dL — ABNORMAL LOW (ref 0.57–1.00)
Glucose: 95 mg/dL (ref 70–99)
Potassium: 4.5 mmol/L (ref 3.5–5.2)
Sodium: 134 mmol/L (ref 134–144)
eGFR: 98 mL/min/{1.73_m2} (ref >59)

## 2023-07-17 LAB — CBC
Hematocrit: 35.5 % (ref 34.0–46.6)
Hemoglobin: 11.2 g/dL (ref 11.1–15.9)
MCH: 26.7 pg (ref 26.6–33.0)
MCHC: 31.5 g/dL (ref 31.5–35.7)
MCV: 85 fL (ref 79–97)
Platelets: 256 10*3/uL (ref 150–450)
RBC: 4.19 x10E6/uL (ref 3.77–5.28)
RDW: 14.1 % (ref 11.7–15.4)
WBC: 4.7 10*3/uL (ref 3.4–10.8)

## 2023-07-17 LAB — PROTEIN / CREATININE RATIO, URINE
Creatinine, Urine: 41.1 mg/dL
Protein, Ur: 14.6 mg/dL
Protein/Creat Ratio: 355 mg/g{creat} — ABNORMAL HIGH (ref 0–200)

## 2023-07-17 LAB — TSH: TSH: 2.12 u[IU]/mL (ref 0.450–4.500)

## 2023-07-17 NOTE — Assessment & Plan Note (Signed)
 Not sure she is getting enough protein in her diet.  Talked about supplements.  Given her history of GI issues, she will have to be careful using any particular protein supplements but I recommend she start with whey protein.  She likes the liquid supplements but they are very expensive.

## 2023-07-17 NOTE — Assessment & Plan Note (Signed)
 This improvement of her anxiety, she is essentially back to baseline so we will continue her current and chronic medication of antidepressant therapy.  This has worked well for her.

## 2023-07-17 NOTE — Progress Notes (Signed)
    CHIEF COMPLAINT / HPI: Missed some Lasix  dosing and had increased lower extremity edema.  Took 120 mg yesterday and is better today.  She is concerned about the edema and she feels like she has gained weight. #2.  We had added low-dose Seroquel  for breakthrough anxiety and that seems to be helping.  No side effects from that 3.  Continues to be fatigued.  Wonders if she is getting enough protein in her diet.  Was using some supplements shakes but they are very expensive.  Wonders if there are alternatives.    PERTINENT  PMH / PSH: I have reviewed the patient's medications, allergies, past medical and surgical history, smoking status and updated in the EMR as appropriate.   OBJECTIVE:  BP (!) 108/58   Pulse 98   Ht 4\' 10"  (1.473 m)   Wt 151 lb 9.6 oz (68.8 kg)   SpO2 100%   BMI 31.68 kg/m  Vital signs reviewed. GENERAL: Well-developed, well-nourished, no acute distress. CARDIOVASCULAR: Regular rate and rhythm no murmur gallop or rub LUNGS: Clear to auscultation bilaterally, no rales or wheeze. ABDOMEN: Soft positive bowel sounds NEURO: No gross focal neurological deficits. MSK: Movement of extremity x 4. EXTREMITY: Trace pitting edema at the level of the ankle bilaterally.  ASSESSMENT / PLAN:   PANIC ATTACKS Improvement since we added low-dose Seroquel .  Will continue that at current dose.   Dysthymia This improvement of her anxiety, she is essentially back to baseline so we will continue her current and chronic medication of antidepressant therapy.  This has worked well for her.  Other fatigue Not sure she is getting enough protein in her diet.  Talked about supplements.  Given her history of GI issues, she will have to be careful using any particular protein supplements but I recommend she start with whey protein.  She likes the liquid supplements but they are very expensive.  Edema She missed a couple doses of her Lasix  and really had some increase in lower extremity  swelling.  Now that she is back on regular dosing it is better.  She is not sure if she needs to go up in dose.  Reviewed her last echocardiogram which was several years ago but normal.  Will check urine creatinine protein ratio today as well as EGFR.  Continue at current twice daily dosing of 40 mg and follow-up 1 month.  She will let me know if she has recurrence of the increased edema. Also I think getting more protein in her diet would be beneficial.  If she continues to have issues, would consider repeat her echocardiogram.   Jacqueline Grice MD

## 2023-07-17 NOTE — Assessment & Plan Note (Addendum)
 Improvement since we added low-dose Seroquel .  Will continue that at current dose.

## 2023-07-17 NOTE — Assessment & Plan Note (Signed)
 She missed a couple doses of her Lasix  and really had some increase in lower extremity swelling.  Now that she is back on regular dosing it is better.  She is not sure if she needs to go up in dose.  Reviewed her last echocardiogram which was several years ago but normal.  Will check urine creatinine protein ratio today as well as EGFR.  Continue at current twice daily dosing of 40 mg and follow-up 1 month.  She will let me know if she has recurrence of the increased edema.

## 2023-07-23 ENCOUNTER — Other Ambulatory Visit (INDEPENDENT_AMBULATORY_CARE_PROVIDER_SITE_OTHER)

## 2023-07-23 DIAGNOSIS — Z7901 Long term (current) use of anticoagulants: Secondary | ICD-10-CM | POA: Diagnosis not present

## 2023-07-23 LAB — POCT INR: INR: 2.8 (ref 2.0–3.0)

## 2023-07-24 ENCOUNTER — Other Ambulatory Visit: Payer: Self-pay | Admitting: Family Medicine

## 2023-07-24 ENCOUNTER — Ambulatory Visit (INDEPENDENT_AMBULATORY_CARE_PROVIDER_SITE_OTHER): Payer: Self-pay

## 2023-07-24 VITALS — Ht <= 58 in | Wt 150.0 lb

## 2023-07-24 DIAGNOSIS — Z0000 Encounter for general adult medical examination without abnormal findings: Secondary | ICD-10-CM | POA: Diagnosis not present

## 2023-07-24 NOTE — Progress Notes (Signed)
 Because this visit was a virtual/telehealth visit,  certain criteria was not obtained, such a blood pressure, CBG if applicable, and timed get up and go. Any medications not marked as "taking" were not mentioned during the medication reconciliation part of the visit. Any vitals not documented were not able to be obtained due to this being a telehealth visit or patient was unable to self-report a recent blood pressure reading due to a lack of equipment at home via telehealth. Vitals that have been documented are verbally provided by the patient.   Subjective:   [name] is a 73 y.o. who presents for a Medicare Wellness preventive visit.  As a reminder, Annual Wellness Visits don't include a physical exam, and some assessments may be limited, especially if this visit is performed virtually. We may recommend an in-person follow-up visit with your provider if needed.  Visit Complete: Virtual I connected with  [name] on 07/24/23 by a audio enabled telemedicine application and verified that I am speaking with the correct person using two identifiers.  Patient Location: Home  Provider Location: Office/Clinic  I discussed the limitations of evaluation and management by telemedicine. The patient expressed understanding and agreed to proceed.  Vital Signs: Because this visit was a virtual/telehealth visit, some criteria may be missing or patient reported. Any vitals not documented were not able to be obtained and vitals that have been documented are patient reported.  VideoDeclined- This patient declined Librarian, academic. Therefore the visit was completed with audio only.  Persons Participating in Visit: Patient.  AWV Questionnaire: Yes: Patient Medicare AWV questionnaire was completed by the patient on 07/23/2023; I have confirmed that all information answered by patient is correct and no changes since this date.  Cardiac Risk Factors include: sedentary  lifestyle;advanced age (>76men, >28 women);hypertension;obesity (BMI >30kg/m2)     Objective:     Today's Vitals   07/24/23 0917  Weight: 150 lb (68 kg)  Height: 4\' 10"  (1.473 m)  PainSc: 5   PainLoc: Hip   Body mass index is 31.35 kg/m.     07/24/2023    9:19 AM 10/16/2022    9:20 AM 07/24/2022    9:37 AM 07/09/2022    8:46 AM 06/24/2022    9:57 AM 06/12/2022   10:17 AM 01/30/2022    9:57 AM  Advanced Directives  Does Patient Have a Medical Advance Directive? Yes No No Yes Yes No No  Type of Estate agent of [place];Living will  Healthcare Power of [place];Living will Healthcare Power of [place];Living will Healthcare Power of [place];Living will    Does patient want to make changes to medical advance directive? No - Patient declined    No - Patient declined    Copy of Healthcare Power of Attorney in Chart? Yes - validated most recent copy scanned in chart (See row information)  Yes - validated most recent copy scanned in chart (See row information)  No - copy requested    Would patient like information on creating a medical advance directive?  No - Patient declined No - Patient declined    No - Patient declined    Current Medications (verified) Outpatient Encounter Medications as of 07/24/2023  Medication Sig   acetaminophen  (TYLENOL ) 325 MG tablet Take 975 mg by mouth every 6 (six) hours as needed for mild pain.   ALPRAZolam  (XANAX ) 0.5 MG tablet TAKE 1 TABLET (0.5 MG TOTAL) BY MOUTH 3 (THREE) TIMES DAILY AS NEEDED FOR ANXIETY.  benazepril  (LOTENSIN ) 20 MG tablet Take 1 tablet (20 mg total) by mouth daily.   diclofenac  (VOLTAREN ) 75 MG EC tablet Take one or two tabs by mouth daily as needed for pain   esomeprazole  (NEXIUM ) 20 MG capsule Take 2 capsules (40 mg total) by mouth daily.   famotidine (PEPCID) 20 MG tablet Take 20 mg by mouth at bedtime.   fluconazole  (DIFLUCAN ) 100 MG tablet TAKE 1 TABLET (100 MG TOTAL) BY MOUTH EVERY MONDAY.   furosemide   (LASIX ) 20 MG tablet TAKE 2 TABLETS BY MOUTH TWICE A DAY   gabapentin  (NEURONTIN ) 100 MG capsule TAKE 2 CAPSULES BY MOUTH 3 TIMES A DAY   QUEtiapine  (SEROQUEL ) 25 MG tablet TAKE 1 TABLET BY MOUTH EVERYDAY AT BEDTIME   venlafaxine  XR (EFFEXOR -XR) 150 MG 24 hr capsule Take 1 capsule (150 mg total) by mouth daily.   venlafaxine  XR (EFFEXOR -XR) 75 MG 24 hr capsule TAKE 1 CAPSULE BY MOUTH EVERY DAY   warfarin (COUMADIN ) 2 MG tablet Take 4 tablets a day as directed by mouth   [DISCONTINUED] Azelastine  & Fluticasone  137 & 50 MCG/ACT THPK Spray each nostril once a day (Patient not taking: Reported on 06/24/2022)   No facility-administered encounter medications on file as of 07/24/2023.    Allergies (verified) Augmentin [amoxicillin -pot clavulanate], Vioxx [rofecoxib], Bactrim  [sulfamethoxazole -trimethoprim ], Banana, Cephalosporins, Ciprofloxacin , Coconut flavoring agent (non-screening), Codeine, and Zithromax [azithromycin]   History: Past Medical History:  Diagnosis Date   Anemia    take iron   Anxiety    Arthritis    Depression    Dysrhythmia 2016   occ due to caffeine intake   Encounter for long-term (current) use of other medications    GERD (gastroesophageal reflux disease)    Hepatitis    age 88   History of hiatal hernia    Hypertension    IBS (irritable bowel syndrome)    Obesity    Osteoarthritis    Pain in joint, multiple sites    Pneumonia 2010   PONV (postoperative nausea and vomiting)    in the 1980's no problems since   Psoriatic arthropathy (HCC)    Pulmonary embolism (HCC) 12/03/2016   submassive/notes 12/03/2016   Raynaud disease    Past Surgical History:  Procedure Laterality Date   ABDOMINAL HYSTERECTOMY  03/1998   COLONOSCOPY     GASTRIC BYPASS  1978   HALLUX VALGUS CORRECTION  09/2010   Dr Alverda Joe; right 1st ray   LAPAROSCOPIC CHOLECYSTECTOMY  04/2000   Maximo Spar 07/03/2010   LAPAROSCOPIC INCISIONAL / UMBILICAL / VENTRAL HERNIA REPAIR  09/2002   VHR/notes  07/03/2010   REVISION TOTAL KNEE ARTHROPLASTY Left 10/2003   Maximo Spar 07/03/2010   REVISION TOTAL KNEE ARTHROPLASTY Left 02/2007   Maximo Spar 06/21/2010   SHOULDER HEMI-ARTHROPLASTY Right 06/28/2016   Procedure: RIGHT SHOULDER HEMI-ARTHROPLASTY;  Surgeon: Winston Hawking, MD;  Location: [facility] OR;  Service: Orthopedics;  Laterality: Right;   TOTAL KNEE ARTHROPLASTY Left 10/1999   Maximo Spar 07/03/2010   TOTAL KNEE ARTHROPLASTY Right 03/05/2021   Procedure: TOTAL KNEE ARTHROPLASTY;  Surgeon: Liliane Rei, MD;  Location: [facility] ORS;  Service: Orthopedics;  Laterality: Right;   TUBAL LIGATION  1982   Family History  Problem Relation Age of Onset   Depression Mother    Obesity Father    Deep vein thrombosis Father    Arthritis Maternal Grandmother    Arthritis Maternal Grandfather    Arthritis Paternal Grandmother    Arthritis Paternal Grandfather    Breast cancer Neg Hx  Social History   Socioeconomic History   Marital status: Married    Spouse name: Simmie   Number of children: 1   Years of education: 16   Highest education level: Bachelor's degree (e.g., BA, AB, BS)  Occupational History   Occupation: Retired  Tobacco Use   Smoking status: Never    Passive exposure: Never   Smokeless tobacco: Never  Vaping Use   Vaping status: Never Used  Substance and Sexual Activity   Alcohol use: No   Drug use: No   Sexual activity: Yes  Other Topics Concern   Not on file  Social History Narrative   Patient lives with her husband in [place].    Patient has one daughter, two dogs, and  two cats.    Patient is an avid reader and enjoys doing crafts.    Social Drivers of Health   Financial Resource Strain: Medium Risk (07/24/2023)   Overall Financial Resource Strain (CARDIA)    Difficulty of Paying Living Expenses: Somewhat hard  Food Insecurity: No Food Insecurity (07/24/2023)   Hunger Vital Sign    Worried About Running Out of Food in the Last Year: Never true    Ran Out of Food in the Last Year:  Never true  Recent Concern: Food Insecurity - Food Insecurity Present (06/11/2023)   Hunger Vital Sign    Worried About Running Out of Food in the Last Year: Sometimes true    Ran Out of Food in the Last Year: Patient declined  Transportation Needs: No Transportation Needs (07/24/2023)   PRAPARE - Administrator, Civil Service (Medical): No    Lack of Transportation (Non-Medical): No  Physical Activity: Inactive (07/24/2023)   Exercise Vital Sign    Days of Exercise per Week: 0 days    Minutes of Exercise per Session: 0 min  Stress: No Stress Concern Present (07/24/2023)   [employer] of Occupational Health - Occupational Stress Questionnaire    Feeling of Stress : Only a little  Recent Concern: Stress - Stress Concern Present (06/11/2023)   [employer] of Occupational Health - Occupational Stress Questionnaire    Feeling of Stress : To some extent  Social Connections: Moderately Isolated (07/24/2023)   Social Connection and Isolation Panel [NHANES]    Frequency of Communication with Friends and Family: More than three times a week    Frequency of Social Gatherings with Friends and Family: Once a week    Attends Religious Services: Never    Database administrator or Organizations: No    Attends Engineer, structural: Never    Marital Status: Married    Tobacco Counseling Counseling given: Not Answered    Clinical Intake:  Pre-visit preparation completed: Yes  Pain : 0-10 Pain Score: 5  Pain Location: Hip Pain Orientation: Right     BMI - recorded: 31.35 Nutritional Risks: None Diabetes: No  Lab Results  Component Value Date   HGBA1C 5.6 08/08/2010     How often do you need to have someone help you when you read instructions, pamphlets, or other written materials from your doctor or pharmacy?: 1 - Never What is the last grade level you completed in school?: [employer]  Interpreter Needed?: No  Information entered by :: Kita Neace N.  Akashdeep Chuba, LPN.   Activities of Daily Living     07/24/2023    9:20 AM 07/23/2023   10:56 AM  In your present state of health, do you have any difficulty performing the  following activities:  Hearing? 1 1  Comment Wears hearing aids.   Vision? 0 0  Difficulty concentrating or making decisions? 1 1  Walking or climbing stairs? 1 1  Dressing or bathing? 0 0  Doing errands, shopping? 0 0  Preparing Food and eating ? N N  Using the Toilet? N N  In the past six months, have you accidently leaked urine? Y Y  Do you have problems with loss of bowel control? Y Y  Managing your Medications? N N  Managing your Finances? N N  Housekeeping or managing your Housekeeping? N N    Patient Care Team: Charise Companion, MD as PCP - General Gregoria Leas, OD as Consulting Physician (Optometry) Liliane Rei, MD as Consulting Physician (Orthopedic Surgery)  I have updated your Care Teams any recent Medical Services you may have received from other providers in the past year.     Assessment:    This is a routine wellness examination for Atisha.  Hearing/Vision screen Hearing Screening - Comments:: Patient wears hearing aids. Vision Screening - Comments:: Wears rx glasses - up to date with routine eye exams with [facility]    Goals Addressed             This Visit's Progress    07/24/2023: My goal is to keep moving and get a new hip.         Depression Screen     07/24/2023    9:20 AM 07/16/2023   10:07 AM 12/25/2022    1:39 PM 10/16/2022    9:21 AM 07/24/2022    9:36 AM 06/24/2022    9:50 AM 06/12/2022   10:22 AM  PHQ 2/9 Scores  PHQ - 2 Score 0 0 0 1 0 1 1  PHQ- 9 Score 0 2 0 3 1  3     Fall Risk     07/24/2023    9:19 AM 07/23/2023   10:56 AM 07/16/2023    9:59 AM 12/25/2022    1:39 PM 10/16/2022    9:21 AM  Fall Risk   Falls in the past year? 0 0 0 1 0  Number falls in past yr: 0   0 0  Injury with Fall? 0   0 0  Risk for fall due to : No Fall Risks    No Fall Risks  Follow up  Falls evaluation completed    Falls prevention discussed    MEDICARE RISK AT HOME:  Medicare Risk at Home Any stairs in or around the home?: Yes If so, are there any without handrails?: Yes Home free of loose throw rugs in walkways, pet beds, electrical cords, etc?: Yes Adequate lighting in your home to reduce risk of falls?: Yes Life alert?: No Use of a cane, walker or w/c?: No Grab bars in the bathroom?: No Shower chair or bench in shower?: No Elevated toilet seat or a handicapped toilet?: No  TIMED UP AND GO:  Was the test performed?  No  Cognitive Function: Declined/Normal: No cognitive concerns noted by patient or family. Patient alert, oriented, able to answer questions appropriately and recall recent events. No signs of memory loss or confusion.    07/24/2023    9:20 AM  MMSE - Mini Mental State Exam  Not completed: Unable to complete        07/24/2023    9:31 AM 06/24/2022    9:55 AM 09/14/2020   11:24 AM  6CIT Screen  What Year? 0 points 0  points 0 points  What month? 0 points 0 points 0 points  What time? 0 points 0 points 0 points  Count back from 20 0 points 0 points 0 points  Months in reverse 0 points 0 points 0 points  Repeat phrase 0 points 0 points 0 points  Total Score 0 points 0 points 0 points    Immunizations Immunization History  Administered Date(s) Administered   Fluad Quad(high Dose 65+) 10/24/2018, 11/08/2020   Influenza Whole 10/20/2007, 11/22/2011   Influenza,inj,Quad PF,6+ Mos 02/22/2015, 11/13/2015, 10/23/2016, 10/28/2017, 12/05/2021   Influenza-Unspecified 10/31/2018, 12/01/2019, 11/06/2022   PFIZER(Purple Top)SARS-COV-2 Vaccination 04/22/2019, 05/18/2019, 01/07/2020   Pfizer Covid-19 Vaccine Bivalent Booster 44yrs & up 12/19/2020   Pfizer(Comirnaty)Fall Seasonal Vaccine 12 years and older 01/30/2022, 11/06/2022   Pneumococcal Conjugate-13 10/28/2017   Pneumococcal Polysaccharide-23 08/18/2019   Td 04/18/2005   Tdap 02/16/2016   Zoster  Recombinant(Shingrix) 09/14/2022, 11/16/2022   Zoster, Live 09/08/2013    Screening Tests Health Maintenance  Topic Date Due   COVID-19 Vaccine (7 - 2024-25 season) 05/06/2023   INFLUENZA VACCINE  09/19/2023   Medicare Annual Wellness (AWV)  07/23/2024   MAMMOGRAM  12/11/2024   DTaP/Tdap/Td (3 - Td or Tdap) 02/15/2026   Colonoscopy  07/25/2026   Pneumonia Vaccine 7+ Years old  Completed   DEXA SCAN  Completed   Hepatitis C Screening  Completed   Zoster Vaccines- Shingrix  Completed   HPV VACCINES  Aged Out   Meningococcal B Vaccine  Aged Out    Health Maintenance  Health Maintenance Due  Topic Date Due   COVID-19 Vaccine (7 - 2024-25 season) 05/06/2023   Health Maintenance Items Addressed: Yes Patient aware of current care gaps.  Immunization record was verified by [facility] and updated in patient's chart.  Additional Screening:  Vision Screening: Recommended annual ophthalmology exams for early detection of glaucoma and other disorders of the eye. Would you like a referral to an eye doctor? No    Dental Screening: Recommended annual dental exams for proper oral hygiene  Community Resource Referral / Chronic Care Management: CRR required this visit?  No   CCM required this visit?  No   Plan:    I have personally reviewed and noted the following in the patient's chart:   Medical and social history Use of alcohol, tobacco or illicit drugs  Current medications and supplements including opioid prescriptions. Patient is not currently taking opioid prescriptions. Functional ability and status Nutritional status Physical activity Advanced directives List of other physicians Hospitalizations, surgeries, and ER visits in previous 12 months Vitals Screenings to include cognitive, depression, and falls Referrals and appointments  In addition, I have reviewed and discussed with patient certain preventive protocols, quality metrics, and best practice recommendations. A  written personalized care plan for preventive services as well as general preventive health recommendations were provided to patient.   Margette Sheldon, LPN   02/23/1094   After Visit Summary: (MyChart) Due to this being a telephonic visit, the after visit summary with patients personalized plan was offered to patient via MyChart   Notes: Patient aware of current care gaps.  Immunization record was verified by [facility] and updated in patient's chart.

## 2023-07-24 NOTE — Patient Instructions (Signed)
 Ms. [name] , Thank you for taking time out of your busy schedule to complete your Annual Wellness Visit with me. I enjoyed our conversation and look forward to speaking with you again next year. I, as well as your care team,  appreciate your ongoing commitment to your health goals. Please review the following plan we discussed and let me know if I can assist you in the future. Your Game plan/ To Do List    Referrals: If you haven't heard from the office you've been referred to, please reach out to them at the phone provided.   Follow up Visits: Next Medicare AWV with our clinical staff: 07/26/2024 at 9:50 a.m. Phone Visit with Nurse Health Advisor   Have you seen your provider in the last 6 months (3 months if uncontrolled diabetes)? Yes Next Office Visit with your provider: Patient will call to schedule with Dr. Andree Kayser  Clinician Recommendations:  Aim for 30 minutes of exercise or brisk walking, 6-8 glasses of water , and 5 servings of fruits and vegetables each day.       This is a list of the screening recommended for you and due dates:  Health Maintenance  Topic Date Due   COVID-19 Vaccine (7 - 2024-25 season) 05/06/2023   Flu Shot  09/19/2023   Medicare Annual Wellness Visit  07/23/2024   Mammogram  12/11/2024   DTaP/Tdap/Td vaccine (3 - Td or Tdap) 02/15/2026   Colon Cancer Screening  07/25/2026   Pneumonia Vaccine  Completed   DEXA scan (bone density measurement)  Completed   Hepatitis C Screening  Completed   Zoster (Shingles) Vaccine  Completed   HPV Vaccine  Aged Out   Meningitis B Vaccine  Aged Out    Advanced directives: (In Chart) A copy of your advanced directives are scanned into your chart should your provider ever need it. Advance Care Planning is important because it:  [x]  Makes sure you receive the medical care that is consistent with your values, goals, and preferences  [x]  It provides guidance to your family and loved ones and reduces their decisional burden about  whether or not they are making the right decisions based on your wishes.  Follow the link provided in your after visit summary or read over the paperwork we have mailed to you to help you started getting your Advance Directives in place. If you need assistance in completing these, please reach out to us  so that we can help you!  See attachments for Preventive Care and Fall Prevention Tips.

## 2023-07-28 NOTE — Progress Notes (Signed)
I have reviewed this visit and agree with the documentation.   

## 2023-08-06 ENCOUNTER — Other Ambulatory Visit

## 2023-08-06 DIAGNOSIS — Z7901 Long term (current) use of anticoagulants: Secondary | ICD-10-CM

## 2023-08-06 LAB — POCT INR: INR: 2.9 (ref 2.0–3.0)

## 2023-08-20 ENCOUNTER — Other Ambulatory Visit

## 2023-08-20 DIAGNOSIS — Z7901 Long term (current) use of anticoagulants: Secondary | ICD-10-CM

## 2023-08-20 LAB — POCT INR: INR: 3.5 — AB (ref 2.0–3.0)

## 2023-08-27 ENCOUNTER — Other Ambulatory Visit

## 2023-08-27 DIAGNOSIS — Z7901 Long term (current) use of anticoagulants: Secondary | ICD-10-CM

## 2023-08-27 LAB — POCT INR: INR: 2.5 (ref 2.0–3.0)

## 2023-09-03 ENCOUNTER — Other Ambulatory Visit (INDEPENDENT_AMBULATORY_CARE_PROVIDER_SITE_OTHER)

## 2023-09-03 DIAGNOSIS — Z7901 Long term (current) use of anticoagulants: Secondary | ICD-10-CM | POA: Diagnosis not present

## 2023-09-03 LAB — POCT INR: INR: 2.4 (ref 2.0–3.0)

## 2023-09-11 DIAGNOSIS — M25551 Pain in right hip: Secondary | ICD-10-CM | POA: Diagnosis not present

## 2023-09-17 ENCOUNTER — Other Ambulatory Visit

## 2023-09-17 DIAGNOSIS — Z7901 Long term (current) use of anticoagulants: Secondary | ICD-10-CM | POA: Diagnosis not present

## 2023-09-17 LAB — POCT INR: INR: 3.6 — AB (ref 2.0–3.0)

## 2023-09-23 ENCOUNTER — Other Ambulatory Visit (INDEPENDENT_AMBULATORY_CARE_PROVIDER_SITE_OTHER)

## 2023-09-23 DIAGNOSIS — Z7901 Long term (current) use of anticoagulants: Secondary | ICD-10-CM

## 2023-09-23 LAB — POCT INR: INR: 3.3 — AB (ref 2.0–3.0)

## 2023-09-25 DIAGNOSIS — M25551 Pain in right hip: Secondary | ICD-10-CM | POA: Insufficient documentation

## 2023-09-26 DIAGNOSIS — M25551 Pain in right hip: Secondary | ICD-10-CM | POA: Diagnosis not present

## 2023-09-30 ENCOUNTER — Other Ambulatory Visit (INDEPENDENT_AMBULATORY_CARE_PROVIDER_SITE_OTHER)

## 2023-09-30 DIAGNOSIS — Z7901 Long term (current) use of anticoagulants: Secondary | ICD-10-CM

## 2023-09-30 LAB — POCT INR: INR: 3.7 — AB (ref 2.0–3.0)

## 2023-10-02 DIAGNOSIS — M25551 Pain in right hip: Secondary | ICD-10-CM | POA: Diagnosis not present

## 2023-10-06 DIAGNOSIS — M25551 Pain in right hip: Secondary | ICD-10-CM | POA: Diagnosis not present

## 2023-10-07 ENCOUNTER — Other Ambulatory Visit (INDEPENDENT_AMBULATORY_CARE_PROVIDER_SITE_OTHER)

## 2023-10-07 DIAGNOSIS — Z7901 Long term (current) use of anticoagulants: Secondary | ICD-10-CM

## 2023-10-07 LAB — POCT INR: INR: 2.9 (ref 2.0–3.0)

## 2023-10-09 DIAGNOSIS — M25551 Pain in right hip: Secondary | ICD-10-CM | POA: Diagnosis not present

## 2023-10-09 DIAGNOSIS — L820 Inflamed seborrheic keratosis: Secondary | ICD-10-CM | POA: Diagnosis not present

## 2023-10-09 DIAGNOSIS — L821 Other seborrheic keratosis: Secondary | ICD-10-CM | POA: Diagnosis not present

## 2023-10-13 DIAGNOSIS — M25551 Pain in right hip: Secondary | ICD-10-CM | POA: Diagnosis not present

## 2023-10-14 ENCOUNTER — Other Ambulatory Visit (INDEPENDENT_AMBULATORY_CARE_PROVIDER_SITE_OTHER)

## 2023-10-14 DIAGNOSIS — Z7901 Long term (current) use of anticoagulants: Secondary | ICD-10-CM | POA: Diagnosis not present

## 2023-10-14 LAB — POCT INR: INR: 2.7 (ref 2.0–3.0)

## 2023-10-15 DIAGNOSIS — M25551 Pain in right hip: Secondary | ICD-10-CM | POA: Diagnosis not present

## 2023-10-21 DIAGNOSIS — M25551 Pain in right hip: Secondary | ICD-10-CM | POA: Diagnosis not present

## 2023-10-24 DIAGNOSIS — M25551 Pain in right hip: Secondary | ICD-10-CM | POA: Diagnosis not present

## 2023-10-27 ENCOUNTER — Telehealth: Payer: Self-pay | Admitting: Pharmacist

## 2023-10-27 DIAGNOSIS — I10 Essential (primary) hypertension: Secondary | ICD-10-CM

## 2023-10-27 MED ORDER — BENAZEPRIL HCL 20 MG PO TABS: 20.0000 mg | ORAL_TABLET | Freq: Every day | ORAL | 3 refills | Status: AC

## 2023-10-27 NOTE — Telephone Encounter (Signed)
 Patient contacted for follow-up of med adherence  Since last contact patient reports taking benazepril  20 mg daily consistently. She had got a little ahead of fills at the beginning of the year due to switching pharmacies. She will be out of refils after this current bottle.    Medication Plan: -Sending refill of benazepril  20 mg to the pharmacy   Total time with patient call and documentation of interaction: 6 minutes. Planned PCP visit to be scheduled within the next month

## 2023-10-27 NOTE — Telephone Encounter (Signed)
 Reviewed and agree with Dr Rennis plan.

## 2023-10-28 ENCOUNTER — Other Ambulatory Visit (INDEPENDENT_AMBULATORY_CARE_PROVIDER_SITE_OTHER)

## 2023-10-28 DIAGNOSIS — Z7901 Long term (current) use of anticoagulants: Secondary | ICD-10-CM | POA: Diagnosis not present

## 2023-10-28 LAB — POCT INR: INR: 2.6 (ref 2.0–3.0)

## 2023-10-29 ENCOUNTER — Other Ambulatory Visit: Payer: Self-pay | Admitting: Family Medicine

## 2023-10-29 DIAGNOSIS — Z1231 Encounter for screening mammogram for malignant neoplasm of breast: Secondary | ICD-10-CM

## 2023-10-30 DIAGNOSIS — M25551 Pain in right hip: Secondary | ICD-10-CM | POA: Diagnosis not present

## 2023-10-30 DIAGNOSIS — M545 Low back pain, unspecified: Secondary | ICD-10-CM | POA: Diagnosis not present

## 2023-11-11 ENCOUNTER — Other Ambulatory Visit (INDEPENDENT_AMBULATORY_CARE_PROVIDER_SITE_OTHER)

## 2023-11-11 DIAGNOSIS — Z7901 Long term (current) use of anticoagulants: Secondary | ICD-10-CM

## 2023-11-11 LAB — POCT INR: INR: 3.4 — AB (ref 2.0–3.0)

## 2023-11-18 ENCOUNTER — Other Ambulatory Visit

## 2023-11-18 ENCOUNTER — Other Ambulatory Visit (INDEPENDENT_AMBULATORY_CARE_PROVIDER_SITE_OTHER)

## 2023-11-18 DIAGNOSIS — M5416 Radiculopathy, lumbar region: Secondary | ICD-10-CM | POA: Diagnosis not present

## 2023-11-18 DIAGNOSIS — Z7901 Long term (current) use of anticoagulants: Secondary | ICD-10-CM

## 2023-11-18 LAB — POCT INR: INR: 3.2 — AB (ref 2.0–3.0)

## 2023-11-27 ENCOUNTER — Other Ambulatory Visit (INDEPENDENT_AMBULATORY_CARE_PROVIDER_SITE_OTHER)

## 2023-11-27 DIAGNOSIS — Z7901 Long term (current) use of anticoagulants: Secondary | ICD-10-CM | POA: Diagnosis not present

## 2023-11-27 LAB — POCT INR: INR: 2.6 (ref 2.0–3.0)

## 2023-11-29 ENCOUNTER — Other Ambulatory Visit: Payer: Self-pay | Admitting: Family Medicine

## 2023-11-29 DIAGNOSIS — Z7901 Long term (current) use of anticoagulants: Secondary | ICD-10-CM

## 2023-11-29 DIAGNOSIS — I10 Essential (primary) hypertension: Secondary | ICD-10-CM

## 2023-12-04 ENCOUNTER — Other Ambulatory Visit (INDEPENDENT_AMBULATORY_CARE_PROVIDER_SITE_OTHER)

## 2023-12-04 DIAGNOSIS — Z7901 Long term (current) use of anticoagulants: Secondary | ICD-10-CM

## 2023-12-04 LAB — POCT INR: INR: 3.2 — AB (ref 2.0–3.0)

## 2023-12-05 ENCOUNTER — Ambulatory Visit: Admitting: Family Medicine

## 2023-12-05 VITALS — BP 103/66 | HR 82 | Wt 146.2 lb

## 2023-12-05 DIAGNOSIS — R519 Headache, unspecified: Secondary | ICD-10-CM | POA: Diagnosis not present

## 2023-12-05 DIAGNOSIS — H9202 Otalgia, left ear: Secondary | ICD-10-CM

## 2023-12-05 MED ORDER — CIPROFLOXACIN-DEXAMETHASONE 0.3-0.1 % OT SUSP: 4.0000 [drp] | Freq: Two times a day (BID) | OTIC | 0 refills | Status: AC

## 2023-12-05 NOTE — Patient Instructions (Signed)
Earache, Adult An earache, or ear pain, can be caused by many things, including: An infection. Ear wax buildup. Ear pressure. Something in the ear that should not be there (foreign body). A sore throat. Tooth problems. Jaw problems. Treatment of the earache will depend on the cause. If the cause is not clear or cannot be known, you may need to watch your symptoms until your earache goes away or until a cause is found. Follow these instructions at home: Medicines Take or apply over-the-counter and prescription medicines only as told by your health care provider. If you were prescribed antibiotics, use them as told by your health care provider. Do not stop using the antibiotic even if you start to feel better. Do not put anything in your ear other than medicine that is prescribed by your health care provider. Managing pain     If directed, apply heat to the affected area as often as told by your health care provider. Use the heat source that your health care provider recommends, such as a moist heat pack or a heating pad. Place a towel between your skin and the heat source. Leave the heat on for 20-30 minutes. If your skin turns bright red, remove the heat right away to prevent burns. The risk of burns is higher if you cannot feel pain, heat, or cold. If directed, put ice on the affected area. To do this: Put ice in a plastic bag. Place a towel between your skin and the bag. Leave the ice on for 20 minutes, 2-3 times a day. If your skin turns bright red, remove the ice right away to prevent skin damage. The risk of skin damage is higher if you cannot feel pain, heat, or cold.  General instructions Pay attention to any changes in your symptoms. Try resting in an upright position instead of lying down. This may help to reduce pressure in your ear and relieve pain. Chew gum if it helps to relieve your ear pain. Treat any allergies as told by your health care provider. Drink enough fluid  to keep your urine pale yellow. It is up to you to get the results of any tests that were done. Ask your health care provider, or the department that is doing the tests, when your results will be ready. Contact a health care provider if: Your pain does not improve within 2 days. Your earache gets worse. You have new symptoms. You have a fever. Get help right away if: You have a severe headache. You have a stiff neck. You have trouble swallowing. You have redness or swelling behind your ear. You have fluid or blood coming from your ear. You have hearing loss. You feel dizzy. This information is not intended to replace advice given to you by your health care provider. Make sure you discuss any questions you have with your health care provider. Document Revised: 06/18/2021 Document Reviewed: 06/18/2021 Elsevier Patient Education  2024 [employer]  

## 2023-12-05 NOTE — Progress Notes (Signed)
    SUBJECTIVE:   CHIEF COMPLAINT / HPI:   Discussed the use of AI scribe software for clinical note transcription with the patient, who gave verbal consent to proceed.  History of Present Illness   [name] is a 73 year old female who presents with left ear pain and headache.  She woke up at approximately 4:30 to 5:00 AM with severe left ear pain, rated as 8 out of 10. She attempted self-treatment with cold medicine and ear wax softener, which provided some relief. The pain decreased to a 5-6 out of 10 after taking Tylenol  and Voltaren . Swallowing hard exacerbates the ear pain. There is no ear discharge, but she experienced throbbing in the ear earlier in the day.  She also has a headache primarily on the left side of her head, initially rated as 7 out of 10 in severity, which decreased to a 4-5 out of 10 after medication. No fever or recent illness in her household. She suspects that wax traps from her hearing aids might be lodged in her ear, although she has not found any foreign bodies herself.  Her current medications include Tylenol  and Voltaren . She has a known allergy to oral ciprofloxacin , which causes nausea and vomiting, but she has previously used Ciprodex  ear drops without issue. No fever, ear discharge, or recent sick contacts. Reports throbbing in the left ear earlier in the day.       PERTINENT  PMH / PSH: PMHx reviewed  OBJECTIVE:   BP 103/66   Pulse 82   Wt 146 lb 3.2 oz (66.3 kg)   SpO2 94%   BMI 30.56 kg/m   Physical Exam   VITALS: P- 82, BP- 103/66, SaO2- 94% HEENT: Right ear normal, tympanic membrane normal, no discharge or redness. Left ear canal red with blood, no discharge, tympanic membrane normal. No foreign body in the ear canal B/L. CHEST: Lungs clear to auscultation, no wheezing. CARDIOVASCULAR: Heart regular rate and rhythm, no murmurs. EXTREMITIES: No edema in extremities. NEUROLOGICAL: Facial sensation intact, no asymmetry. Motor strength  normal. Deep tendon reflexes normal. No meningeal irritation. Upper and lower extremity sensation intact.       ASSESSMENT/PLAN:   Assessment & Plan Left ear pain Left ear pain with inflammation and minor bleeding (acute otalgia and possible otitis externa, left ear) Acute left ear pain with inflammation and minor bleeding, likely due to viral infection or otitis externa. Tympanic membrane normal. Bleeding likely from scratching while self-treating at home. No foreign body detected. - Prescribed Ciprodex  ear drops to reduce inflammation and pain and prevent superimposed bacterial infection. - Continue Tylenol  as needed for headache and ear pain. - Advised to discontinue Ciprodex  if adverse reaction occurs and notify clinic. - Sent prescription to [facility] pharmacy. Acute nonintractable headache, unspecified headache type Headache pain reduced from 7 to 4-5 after Tylenol . No fever, sick contacts, facial sensory loss, or asymmetry. Normal strength and reflexes. No signs of meningeal irritation - Continue Tylenol  as needed for headache relief. - Follow up soon if symptoms worsen     Jacqueline Fairly, MD [facility]

## 2023-12-10 ENCOUNTER — Encounter: Payer: Self-pay | Admitting: Family Medicine

## 2023-12-10 ENCOUNTER — Ambulatory Visit: Admitting: Family Medicine

## 2023-12-10 VITALS — BP 119/45 | HR 80 | Wt 147.4 lb

## 2023-12-10 DIAGNOSIS — R5383 Other fatigue: Secondary | ICD-10-CM

## 2023-12-10 DIAGNOSIS — R7989 Other specified abnormal findings of blood chemistry: Secondary | ICD-10-CM | POA: Diagnosis not present

## 2023-12-10 DIAGNOSIS — E559 Vitamin D deficiency, unspecified: Secondary | ICD-10-CM | POA: Diagnosis not present

## 2023-12-10 DIAGNOSIS — Z7901 Long term (current) use of anticoagulants: Secondary | ICD-10-CM

## 2023-12-10 DIAGNOSIS — Z23 Encounter for immunization: Secondary | ICD-10-CM

## 2023-12-10 LAB — POCT INR: INR: 2.5 (ref 2.0–3.0)

## 2023-12-10 NOTE — Patient Instructions (Signed)
 Adjust your gabapentin  as you see fit staying within current total dose Increase ear drops to 10 days and let me know if not 100% better Increase flonase  to BID  Consider light therapy  Let me know what the back doctor says

## 2023-12-11 ENCOUNTER — Ambulatory Visit: Payer: Self-pay | Admitting: Family Medicine

## 2023-12-11 DIAGNOSIS — R809 Proteinuria, unspecified: Secondary | ICD-10-CM

## 2023-12-11 LAB — BASIC METABOLIC PANEL WITH GFR
BUN/Creatinine Ratio: 25 (ref 12–28)
BUN: 12 mg/dL (ref 8–27)
CO2: 23 mmol/L (ref 20–29)
Calcium: 8.9 mg/dL (ref 8.7–10.3)
Chloride: 100 mmol/L (ref 96–106)
Creatinine, Ser: 0.48 mg/dL — ABNORMAL LOW (ref 0.57–1.00)
Glucose: 87 mg/dL (ref 70–99)
Potassium: 4.2 mmol/L (ref 3.5–5.2)
Sodium: 137 mmol/L (ref 134–144)
eGFR: 101 mL/min/1.73 (ref >59)

## 2023-12-11 LAB — CBC
Hematocrit: 35.5 % (ref 34.0–46.6)
Hemoglobin: 11.2 g/dL (ref 11.1–15.9)
MCH: 26.8 pg (ref 26.6–33.0)
MCHC: 31.5 g/dL (ref 31.5–35.7)
MCV: 85 fL (ref 79–97)
Platelets: 326 x10E3/uL (ref 150–450)
RBC: 4.18 x10E6/uL (ref 3.77–5.28)
RDW: 13.1 % (ref 11.7–15.4)
WBC: 7.2 x10E3/uL (ref 3.4–10.8)

## 2023-12-11 LAB — VITAMIN D 25 HYDROXY (VIT D DEFICIENCY, FRACTURES): Vit D, 25-Hydroxy: 28.4 ng/mL — ABNORMAL LOW (ref 30.0–100.0)

## 2023-12-11 LAB — PROTEIN / CREATININE RATIO, URINE
Creatinine, Urine: 63.5 mg/dL
Protein, Ur: 29.7 mg/dL
Protein/Creat Ratio: 468 mg/g{creat} — ABNORMAL HIGH (ref 0–200)

## 2023-12-11 NOTE — Progress Notes (Signed)
    CHIEF COMPLAINT / HPI: Here for follow-up of some abnormal labs.  We were going to recheck her protein in her urine. 2.  Also wants to discuss her chronic anticoagulation.  She is trying to get on a program for DOAC. 3.  Chronic fatigue seems to be getting a little bit worse.  If it does not feel like she can really do anything. 4.  Worsening chronic low back pain.  She was seen by the back surgeon and had some imaging and is waiting for follow-up from them.  She has the pain is really keeping her from doing anything at all around the house.  She cannot stand for any length of time and she cannot do any walking outside.  She did try to do some yard work the other day and says she really enjoyed being out in the sun but was not able to do it for very long.   PERTINENT  PMH / PSH: I have reviewed the patient's medications, allergies, past medical and surgical history, smoking status and updated in the EMR as appropriate.   OBJECTIVE:  BP (!) 119/45   Pulse 80   Wt 147 lb 6.4 oz (66.9 kg)   SpO2 98%   BMI 30.81 kg/m   GENERAL: Well-developed no acute distress BACK: Tender to palpation in the lumbar muscular area.  She walks with a stooped forward posture.  She can rise from a chair without additional assistance but it is obviously painful for her. PSYCH: AxOx4. Good eye contact.. No psychomotor retardation or agitation. Appropriate speech fluency and content. Asks and answers questions appropriately. Mood is congruent.  ASSESSMENT / PLAN: #1.  We discussed her chronic fatigue and I think it is being impacted by this worsening back pain.  Also discussed possible interventions they may offer her for her back issues. 2.  I am very hopeful that she will be able to switch from Coumadin  to a DOAC.  We have had quite a bit of trouble controlling her INR.  Reviewed again today and adjusted. 3.  Abnormal protein creatinine ratio at last office visit.  Will recheck today.  If still elevated will  refer to nephrology.  No problem-specific Assessment & Plan notes found for this encounter.   Camie Mulch MD

## 2023-12-15 ENCOUNTER — Ambulatory Visit
Admission: RE | Admit: 2023-12-15 | Discharge: 2023-12-15 | Disposition: A | Discharge status: Home / Self Care | Source: Ambulatory Visit | Attending: Family Medicine | Admitting: Family Medicine

## 2023-12-15 DIAGNOSIS — Z1231 Encounter for screening mammogram for malignant neoplasm of breast: Secondary | ICD-10-CM

## 2023-12-17 ENCOUNTER — Other Ambulatory Visit (INDEPENDENT_AMBULATORY_CARE_PROVIDER_SITE_OTHER)

## 2023-12-17 ENCOUNTER — Encounter: Payer: Self-pay | Admitting: Family Medicine

## 2023-12-17 DIAGNOSIS — Z7901 Long term (current) use of anticoagulants: Secondary | ICD-10-CM

## 2023-12-17 DIAGNOSIS — M5416 Radiculopathy, lumbar region: Secondary | ICD-10-CM | POA: Diagnosis not present

## 2023-12-17 DIAGNOSIS — M545 Low back pain, unspecified: Secondary | ICD-10-CM | POA: Diagnosis not present

## 2023-12-17 DIAGNOSIS — M47816 Spondylosis without myelopathy or radiculopathy, lumbar region: Secondary | ICD-10-CM | POA: Diagnosis not present

## 2023-12-17 LAB — POCT INR: INR: 2.5 (ref 2.0–3.0)

## 2023-12-18 ENCOUNTER — Encounter: Payer: Self-pay | Admitting: *Deleted

## 2023-12-18 DIAGNOSIS — I7300 Raynaud's syndrome without gangrene: Secondary | ICD-10-CM | POA: Insufficient documentation

## 2023-12-18 DIAGNOSIS — Z9884 Bariatric surgery status: Secondary | ICD-10-CM | POA: Insufficient documentation

## 2023-12-18 DIAGNOSIS — K529 Noninfective gastroenteritis and colitis, unspecified: Secondary | ICD-10-CM | POA: Insufficient documentation

## 2023-12-18 DIAGNOSIS — K589 Irritable bowel syndrome without diarrhea: Secondary | ICD-10-CM | POA: Insufficient documentation

## 2023-12-18 DIAGNOSIS — Z86711 Personal history of pulmonary embolism: Secondary | ICD-10-CM | POA: Insufficient documentation

## 2023-12-18 DIAGNOSIS — R143 Flatulence: Secondary | ICD-10-CM | POA: Insufficient documentation

## 2023-12-18 DIAGNOSIS — R21 Rash and other nonspecific skin eruption: Secondary | ICD-10-CM | POA: Insufficient documentation

## 2023-12-18 DIAGNOSIS — R1310 Dysphagia, unspecified: Secondary | ICD-10-CM | POA: Insufficient documentation

## 2023-12-18 DIAGNOSIS — D692 Other nonthrombocytopenic purpura: Secondary | ICD-10-CM | POA: Insufficient documentation

## 2023-12-18 DIAGNOSIS — R634 Abnormal weight loss: Secondary | ICD-10-CM | POA: Insufficient documentation

## 2023-12-18 DIAGNOSIS — R109 Unspecified abdominal pain: Secondary | ICD-10-CM | POA: Insufficient documentation

## 2023-12-19 ENCOUNTER — Ambulatory Visit

## 2023-12-19 ENCOUNTER — Telehealth: Payer: Self-pay | Admitting: *Deleted

## 2023-12-19 VITALS — BP 130/70 | HR 77 | Ht <= 58 in | Wt 141.0 lb

## 2023-12-19 DIAGNOSIS — E785 Hyperlipidemia, unspecified: Secondary | ICD-10-CM | POA: Diagnosis not present

## 2023-12-19 DIAGNOSIS — I2510 Atherosclerotic heart disease of native coronary artery without angina pectoris: Secondary | ICD-10-CM | POA: Insufficient documentation

## 2023-12-19 DIAGNOSIS — I10 Essential (primary) hypertension: Secondary | ICD-10-CM | POA: Diagnosis not present

## 2023-12-19 DIAGNOSIS — Z01810 Encounter for preprocedural cardiovascular examination: Secondary | ICD-10-CM

## 2023-12-19 NOTE — Assessment & Plan Note (Signed)
 Direct LDL levels 125 on 12/25/2022. At this time given her history of coronary atherosclerosis would recommend targeting LDL levels below 70 mg/dL.  As she lives in [place] and is here for preop visit, I will defer this management further to her PCP to be considered for lipid-lowering therapy once her acute surgical issues are addressed.

## 2023-12-19 NOTE — Telephone Encounter (Signed)
   Pre-operative Risk Assessment    Patient Name: [name]  DOB: March 15, 1950 MRN: [id number]     Request for Surgical Clearance    Procedure:  Right Hemilaminectomy  Date of Surgery:  Clearance TBD                                 Surgeon:  Dareen Surgeon's Group or Practice Name:  Not indicated Phone number:  [phone] Fax number:  [phone]   Type of Clearance Requested:   - Medical  - Pharmacy:  Hold Warfarin (Coumadin ) directions for surgery   Type of Anesthesia:  Not Indicated   Additional requests/questions:    [name]   12/19/2023, 1:46 PM

## 2023-12-19 NOTE — Assessment & Plan Note (Signed)
 Coronary atherosclerosis noted on prior CT chest imaging studies. Stress test with nuclear imaging January 2023 no ischemia.  Good functional status at baseline, somewhat limited due to back pain.  No active cardiac symptoms. Currently on warfarin for prior history of PE.

## 2023-12-19 NOTE — Patient Instructions (Addendum)
 Medication Instructions:  Your physician recommends that you continue on your current medications as directed. Please refer to the Current Medication list given to you today.  *If you need a refill on your cardiac medications before your next appointment, please call your pharmacy*   Lab Work: None Ordered If you have labs (blood work) drawn today and your tests are completely normal, you will receive your results only by: MyChart Message (if you have MyChart) OR A paper copy in the mail If you have any lab test that is abnormal or we need to change your treatment, we will call you to review the results.   Testing/Procedures: None Ordered   Follow-Up: At [facility], you and your health needs are our priority.  As part of our continuing mission to provide you with exceptional heart care, we have created designated Provider Care Teams.  These Care Teams include your primary Cardiologist (physician) and Advanced Practice Providers (APPs -  Physician Assistants and Nurse Practitioners) who all work together to provide you with the care you need, when you need it.  We recommend signing up for the patient portal called "MyChart".  Sign up information is provided on this After Visit Summary.  MyChart is used to connect with patients for Virtual Visits (Telemedicine).  Patients are able to view lab/test results, encounter notes, upcoming appointments, etc.  Non-urgent messages can be sent to your provider as well.   To learn more about what you can do with MyChart, go to [email or web address].    Your next appointment:   As needed   The format for your next appointment:   In Person  Provider:   Huntley Dec, MD    Other Instructions NA

## 2023-12-19 NOTE — Assessment & Plan Note (Addendum)
 Preop cardiovascular risk assessment prior to elective back surgery. Functional status had been good until February 2025 after which she has been somewhat limited due to her back pain.  She is kept active including mowing her yard with an electric push mower without any significant limitation.  From cardiac standpoint she appears to meet more than 4 METS activity. Low risk for preoperative cardiac complications.  Okay to proceed with her elective surgery as being planned. No additional cardiac workup required at this time.  She currently is on warfarin for history of PE, being managed by PCP. Defer anticoagulation management and holding perioperatively as per recommendation from PCP and/or the surgeon.

## 2023-12-19 NOTE — Progress Notes (Signed)
 Cardiology Consultation:    Date:  12/19/2023   ID:  CATHLINE DOWEN, DOB Jul 05, 1950, MRN [id number]  PCP:  Rosalynn Camie CROME, MD  Cardiologist:  Alean SAUNDERS Zahira Brummond, MD   Referring MD: Rosalynn Camie CROME, MD   No chief complaint on file.    ASSESSMENT AND PLAN:   Ms. [name] 73 year old woman with history of hypertension, pulmonary embolism 2018 remains on chronic anticoagulation therapy, Raynaud's phenomena, coronary atherosclerosis noted on prior CT chest imaging in 2018 and stress test with nuclear imaging January 2023 noted no ischemia [was done for preop assessment prior to her knee surgery January 2023]. Prior echocardiogram from October 2018 reported normal LVEF 55 to 60%, grade 1 diastolic dysfunction.   Here for preop cardiovascular risk assessment prior to elective back surgery.  Problem List Items Addressed This Visit     Essential hypertension   Well-controlled on current regimen with benazepril  20 mg once daily. Also on furosemide  20 mg twice daily.      Relevant Orders   EKG 12-Lead (Completed)   Preop cardiovascular exam - Primary   Preop cardiovascular risk assessment prior to elective back surgery. Functional status had been good until February 2025 after which she has been somewhat limited due to her back pain.  She is kept active including mowing her yard with an electric push mower without any significant limitation.  From cardiac standpoint she appears to meet more than 4 METS activity. Low risk for preoperative cardiac complications.  Okay to proceed with her elective surgery as being planned. No additional cardiac workup required at this time.  She currently is on warfarin for history of PE, being managed by PCP. Defer anticoagulation management and holding perioperatively as per recommendation from PCP and/or the surgeon.      Coronary atherosclerosis   Coronary atherosclerosis noted on prior CT chest imaging studies. Stress test with nuclear imaging  January 2023 no ischemia.  Good functional status at baseline, somewhat limited due to back pain.  No active cardiac symptoms. Currently on warfarin for prior history of PE.       Dyslipidemia   Direct LDL levels 125 on 12/25/2022. At this time given her history of coronary atherosclerosis would recommend targeting LDL levels below 70 mg/dL.  As she lives in [place] and is here for preop visit, I will defer this management further to her PCP to be considered for lipid-lowering therapy once her acute surgical issues are addressed.      Return to clinic with us  on an as-needed basis.   History of Present Illness:    [name] is a 73 y.o. female who is being seen today for the evaluation of preop cardiovascular risk assessment prior to right hemilaminectomy being planned with [facility] at the request of Rosalynn Camie CROME, MD. Her PCP is through [facility] family practice center in [place]. Previously followed up with Dr. Claudene at heart care [facility] office, last visit May 2019.  Has history of hypertension, pulmonary embolism 2018 remains on chronic anticoagulation therapy, Raynaud's phenomena, coronary atherosclerosis noted on prior CT chest imaging in 2018 and stress test with nuclear imaging January 2023 noted no ischemia [was done for preop assessment prior to her knee surgery January 2023]. Prior echocardiogram from October 2018 reported normal LVEF 55 to 60%, grade 1 diastolic dysfunction.  Pleasant woman here for the visit today accompanied by her husband.  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  Feels overall  she has been doing well from cardiac standpoint has no active symptoms.  Denies any chest pain, shortness of breath, orthopnea or paroxysmal nocturnal dyspnea.  No palpitation, lightheadedness, dizziness or syncopal episodes.  Functional status is limited due to back pain.  Until February 2025 she was able to walk  without significant limitations.  Back pain has gradually limited her mobility.  She has kept herself active with day-to-day work at home including doing lawnmowing through this summer.  Good compliance with her medications at baseline. Follows up with PCP regarding anticoagulation management given her history of PE.   Never smoked. Does not drink alcohol. No illicit drug use.  EKG in the clinic today shows sinus rhythm heart rate 77/min, normal PR interval, normal QRS duration 74 ms, normal QTc 423 ms.  No ischemic changes.  Direct LDL cholesterol 12/25/2022 was 125. Past Medical History:  Diagnosis Date   Anemia    take iron   Anxiety    Arthritis    Depression    Dysrhythmia 2016   occ due to caffeine intake   Encounter for long-term (current) use of other medications    GERD (gastroesophageal reflux disease)    Hepatitis    age 46   History of hiatal hernia    Hypertension    IBS (irritable bowel syndrome)    Obesity    Osteoarthritis    Pain in joint, multiple sites    Pneumonia 2010   PONV (postoperative nausea and vomiting)    in the 1980's no problems since   Psoriatic arthropathy (HCC)    Pulmonary embolism (HCC) 12/03/2016   submassive/notes 12/03/2016   Raynaud disease     Past Surgical History:  Procedure Laterality Date   ABDOMINAL HYSTERECTOMY  03/1998   COLONOSCOPY     GASTRIC BYPASS  1978   HALLUX VALGUS CORRECTION  09/2010   Dr Shonna; right 1st ray   LAPAROSCOPIC CHOLECYSTECTOMY  04/2000   thelbert 07/03/2010   LAPAROSCOPIC INCISIONAL / UMBILICAL / VENTRAL HERNIA REPAIR  09/2002   VHR/notes 07/03/2010   REVISION TOTAL KNEE ARTHROPLASTY Left 10/2003   thelbert 07/03/2010   REVISION TOTAL KNEE ARTHROPLASTY Left 02/2007   thelbert 06/21/2010   SHOULDER HEMI-ARTHROPLASTY Right 06/28/2016   Procedure: RIGHT SHOULDER HEMI-ARTHROPLASTY;  Surgeon: Kay Kemps, MD;  Location: [facility] OR;  Service: Orthopedics;  Laterality: Right;   TOTAL KNEE ARTHROPLASTY Left  10/1999   thelbert 07/03/2010   TOTAL KNEE ARTHROPLASTY Right 03/05/2021   Procedure: TOTAL KNEE ARTHROPLASTY;  Surgeon: Melodi Lerner, MD;  Location: [facility] ORS;  Service: Orthopedics;  Laterality: Right;   TUBAL LIGATION  1982    Current Medications: Current Meds  Medication Sig   acetaminophen  (TYLENOL ) 325 MG tablet Take 975 mg by mouth every 6 (six) hours as needed for mild pain.   ALPRAZolam  (XANAX ) 0.5 MG tablet TAKE 1 TABLET (0.5 MG TOTAL) BY MOUTH 3 (THREE) TIMES DAILY AS NEEDED FOR ANXIETY.   benazepril  (LOTENSIN ) 20 MG tablet Take 1 tablet (20 mg total) by mouth daily.   diclofenac  (VOLTAREN ) 75 MG EC tablet TAKE ONE OR TWO TABS BY MOUTH DAILY AS NEEDED FOR PAIN   esomeprazole  (NEXIUM ) 20 MG capsule Take 2 capsules (40 mg total) by mouth daily.   famotidine (PEPCID) 20 MG tablet Take 20 mg by mouth at bedtime.   fluconazole  (DIFLUCAN ) 100 MG tablet TAKE 1 TABLET (100 MG TOTAL) BY MOUTH EVERY MONDAY.   furosemide  (LASIX ) 20 MG tablet TAKE 2 TABLETS BY MOUTH TWICE A  DAY   gabapentin  (NEURONTIN ) 100 MG capsule TAKE 2 CAPSULES BY MOUTH 3 TIMES A DAY   QUEtiapine  (SEROQUEL ) 25 MG tablet TAKE 1 TABLET BY MOUTH EVERYDAY AT BEDTIME   venlafaxine  XR (EFFEXOR -XR) 150 MG 24 hr capsule TAKE 1 CAPSULE BY MOUTH EVERY DAY   venlafaxine  XR (EFFEXOR -XR) 75 MG 24 hr capsule TAKE 1 CAPSULE BY MOUTH EVERY DAY   warfarin (COUMADIN ) 2 MG tablet TAKE 4 TABLETS A DAY AS DIRECTED BY MOUTH     Allergies:   Cephalosporins, Amoxicillin -pot clavulanate, Vioxx [rofecoxib], Banana extract allergy skin test, Bactrim  [sulfamethoxazole -trimethoprim ], Banana, Ciprofloxacin , Coconut flavoring agent (non-screening), Codeine, and Zithromax [azithromycin]   Social History   Socioeconomic History   Marital status: Married    Spouse name: Simmie   Number of children: 1   Years of education: 16   Highest education level: Bachelor's degree (e.g., BA, AB, BS)  Occupational History   Occupation: Retired  Tobacco Use    Smoking status: Never    Passive exposure: Never   Smokeless tobacco: Never  Vaping Use   Vaping status: Never Used  Substance and Sexual Activity   Alcohol use: No   Drug use: No   Sexual activity: Yes  Other Topics Concern   Not on file  Social History Narrative   Patient lives with her husband in [place].    Patient has one daughter, two dogs, and  two cats.    Patient is an avid reader and enjoys doing crafts.    Social Drivers of Corporate Investment Banker Strain: Low Risk  (12/05/2023)   Overall Financial Resource Strain (CARDIA)    Difficulty of Paying Living Expenses: Not very hard  Food Insecurity: Food Insecurity Present (12/05/2023)   Hunger Vital Sign    Worried About Running Out of Food in the Last Year: Sometimes true    Ran Out of Food in the Last Year: Never true  Transportation Needs: No Transportation Needs (12/05/2023)   PRAPARE - Administrator, Civil Service (Medical): No    Lack of Transportation (Non-Medical): No  Physical Activity: Inactive (12/05/2023)   Exercise Vital Sign    Days of Exercise per Week: 0 days    Minutes of Exercise per Session: Not on file  Stress: No Stress Concern Present (12/05/2023)   [employer] of Occupational Health - Occupational Stress Questionnaire    Feeling of Stress: Only a little  Social Connections: Moderately Isolated (12/05/2023)   Social Connection and Isolation Panel    Frequency of Communication with Friends and Family: More than three times a week    Frequency of Social Gatherings with Friends and Family: Patient declined    Attends Religious Services: Never    Database Administrator or Organizations: No    Attends Engineer, Structural: Not on file    Marital Status: Married     Family History: The patient's family history includes Arthritis in her maternal grandfather, maternal grandmother, paternal grandfather, and paternal grandmother; Deep vein thrombosis in her father;  Depression in her mother; Obesity in her father. There is no history of Breast cancer. ROS:   Please see the history of present illness.    All 14 point review of systems negative except as described per history of present illness.  EKGs/Labs/Other Studies Reviewed:    The following studies were reviewed today:   EKG:  EKG Interpretation Date/Time:  Friday December 19 2023 13:42:08 EDT Ventricular Rate:  77 PR Interval:  136  QRS Duration:  74 QT Interval:  374 QTC Calculation: 423 R Axis:   57  Text Interpretation: Normal sinus rhythm Normal ECG When compared with ECG of 18-Mar-2021 11:24, PREVIOUS ECG IS PRESENT Confirmed by Liborio Hai reddy [phone]) on 12/19/2023 2:14:40 PM    Recent Labs: 07/16/2023: TSH 2.120 12/10/2023: BUN 12; Creatinine, Ser 0.48; Hemoglobin 11.2; Platelets 326; Potassium 4.2; Sodium 137  Recent Lipid Panel    Component Value Date/Time   CHOL 240 (H) 08/18/2019 1229   TRIG 131 08/18/2019 1229   HDL 67 08/18/2019 1229   CHOLHDL 3.6 08/18/2019 1229   CHOLHDL 4.0 12/25/2011 0859   VLDL 28 12/25/2011 0859   LDLCALC 150 (H) 08/18/2019 1229   LDLDIRECT 125 (H) 12/25/2022 1404   LDLDIRECT 139 (H) 08/02/2015 1236    Physical Exam:    VS:  BP 130/70   Pulse 77   Ht 4' 10 (1.473 m)   Wt 141 lb (64 kg)   SpO2 97%   BMI 29.47 kg/m     Wt Readings from Last 3 Encounters:  12/19/23 141 lb (64 kg)  12/10/23 147 lb 6.4 oz (66.9 kg)  12/05/23 146 lb 3.2 oz (66.3 kg)     GENERAL:  Well nourished, well developed in no acute distress NECK: No JVD; No carotid bruits CARDIAC: RRR, S1 and S2 present, no murmurs, no rubs, no gallops CHEST:  Clear to auscultation without rales, wheezing or rhonchi  Extremities: No pitting pedal edema. Pulses bilaterally symmetric with radial 2+ and dorsalis pedis 2+ NEUROLOGIC:  Alert and oriented x 3  Medication Adjustments/Labs and Tests Ordered: Current medicines are reviewed at length with the patient today.   Concerns regarding medicines are outlined above.  Orders Placed This Encounter  Procedures   EKG 12-Lead   No orders of the defined types were placed in this encounter.   Signed, Chaston Bradburn reddy Sherrika Weakland, MD, MPH, [facility]. 12/19/2023 2:18 PM    [facility]

## 2023-12-19 NOTE — Assessment & Plan Note (Signed)
 Well-controlled on current regimen with benazepril  20 mg once daily. Also on furosemide  20 mg twice daily.

## 2023-12-22 ENCOUNTER — Telehealth: Payer: Self-pay

## 2023-12-22 NOTE — Telephone Encounter (Signed)
 Clearance notes from office visit 12/19/23 faxed to requesting provider.  Coumadin  is not managed by cardiology.  Rosaline EMERSON Bane, NP-C  12/22/2023, 2:47 PM [address], [address] [place], [place] [address] Office [phone] Fax [phone]

## 2023-12-22 NOTE — Telephone Encounter (Signed)
 Received fax from [facility] and patient has been reminded of her appointment with them on 12/30/23 at 10am. Najia Hurlbutt CMA

## 2023-12-29 ENCOUNTER — Other Ambulatory Visit: Payer: Self-pay | Admitting: Family Medicine

## 2023-12-30 ENCOUNTER — Encounter: Payer: Self-pay | Admitting: Family Medicine

## 2023-12-30 DIAGNOSIS — I129 Hypertensive chronic kidney disease with stage 1 through stage 4 chronic kidney disease, or unspecified chronic kidney disease: Secondary | ICD-10-CM | POA: Diagnosis not present

## 2023-12-30 DIAGNOSIS — Z7901 Long term (current) use of anticoagulants: Secondary | ICD-10-CM | POA: Diagnosis not present

## 2023-12-30 DIAGNOSIS — R809 Proteinuria, unspecified: Secondary | ICD-10-CM | POA: Diagnosis not present

## 2023-12-31 ENCOUNTER — Other Ambulatory Visit

## 2023-12-31 DIAGNOSIS — Z7901 Long term (current) use of anticoagulants: Secondary | ICD-10-CM

## 2023-12-31 LAB — POCT INR: INR: 2.5 (ref 2.0–3.0)

## 2024-01-01 ENCOUNTER — Other Ambulatory Visit: Payer: Self-pay | Admitting: Nephrology

## 2024-01-01 DIAGNOSIS — R809 Proteinuria, unspecified: Secondary | ICD-10-CM

## 2024-01-02 ENCOUNTER — Encounter: Payer: Self-pay | Admitting: Family Medicine

## 2024-01-02 ENCOUNTER — Ambulatory Visit
Admission: RE | Admit: 2024-01-02 | Discharge: 2024-01-02 | Disposition: A | Discharge status: Home / Self Care | Source: Ambulatory Visit | Attending: Nephrology | Admitting: Nephrology

## 2024-01-02 DIAGNOSIS — R809 Proteinuria, unspecified: Secondary | ICD-10-CM

## 2024-01-02 NOTE — Progress Notes (Signed)
 Planned right hemilaminectomy in 73 yo patient who is on lifelong chronic warfarin therapy for prior pulmonary embolus. GUPTA periopeartive risk of MI intraoperatively ior up to 30 days post op: 0.1% GUPTA risk postop pneumonia : 0.3%  Hx submassive PE, unprovoked in 2018. Seen by Hema/Onc (Dr. Cala) who wrote in his last note: female with apparently unprovoked pulmonary embolism with evidence of right ventricular strain by imaging, previously anticoagulated with apixaban  (Eliquis ) and recently transitioned to Coumadin  due to apixaban  being unaffordable. 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 At the present time, patient is undergoing suppressive therapy including rituximab. Lab work demonstrated no identifiable abnormalities to suggest presence of antiphospholipid antibody syndrome, antithrombin, protein C, or protein S deficiency. Patient also tested negative for presence of prothrombin gene or factor five Leiden mutations.   At this time, etiology of the thrombosis remains unclear.  Doppler ultrasounds of bilateral upper and lower extremities obtained since last visit to the clinic showed no evidence of residual deep vein thrombosis.   Plan: --Agree with plan to continue warfarin anticoagulation indefinitely due to significant risk of recurrent thrombosis in the severity of the initial presentation ------------------------------------------------------------------ SURGICAL CLEARANCE  & ANTICOAGULATION Will clear for spine surgery, stop warfarin 5 days prior, recommend INR check night before or morningof surgery, and can restart warfarin 12-24 hours postop.

## 2024-01-14 ENCOUNTER — Other Ambulatory Visit

## 2024-01-14 DIAGNOSIS — Z7901 Long term (current) use of anticoagulants: Secondary | ICD-10-CM | POA: Diagnosis not present

## 2024-01-14 LAB — POCT INR: INR: 3.1 — AB (ref 2.0–3.0)

## 2024-01-21 ENCOUNTER — Other Ambulatory Visit

## 2024-01-22 ENCOUNTER — Other Ambulatory Visit

## 2024-01-22 DIAGNOSIS — Z7901 Long term (current) use of anticoagulants: Secondary | ICD-10-CM

## 2024-01-22 LAB — POCT INR: INR: 4 — AB (ref 2.0–3.0)

## 2024-01-29 ENCOUNTER — Other Ambulatory Visit (INDEPENDENT_AMBULATORY_CARE_PROVIDER_SITE_OTHER)

## 2024-01-29 ENCOUNTER — Encounter: Payer: Self-pay | Admitting: Pharmacist

## 2024-01-29 ENCOUNTER — Other Ambulatory Visit (HOSPITAL_COMMUNITY): Payer: Self-pay | Admitting: Nephrology

## 2024-01-29 DIAGNOSIS — N179 Acute kidney failure, unspecified: Secondary | ICD-10-CM

## 2024-01-29 DIAGNOSIS — Z7901 Long term (current) use of anticoagulants: Secondary | ICD-10-CM

## 2024-01-29 LAB — POCT INR: INR: 2 (ref 2.0–3.0)

## 2024-01-29 NOTE — Progress Notes (Signed)
 Patient instructed to take 6 mg of warfarin today, then 5 mg daily through 02/03/2024. She will stop warfarin for surgery on the 17th. Follow surgeon's directions for stopping/starting coumadin  and follow up INR post surgery. Lamar CROME Sirenia Whitis

## 2024-01-29 NOTE — Progress Notes (Signed)
 This patient is appearing on a report for being at risk of failing the adherence measure for hypertension (ACEi/ARB) medications this calendar year.   Medication: Benazepril  Last fill date: 01/15/24 for 90 day supply - Anticipate PASS  Reviewed medication indication, dosing, and goals of therapy.

## 2024-01-30 NOTE — Progress Notes (Signed)
 Surgical Instructions   Your procedure is scheduled on February 09, 2024. Report to [facility] Main Entrance A at 8:45 A.M., then check in with the Admitting office. Any questions or running late day of surgery: call [phone]  Questions prior to your surgery date: call [phone], Monday-Friday, 8am-4pm. If you experience any cold or flu symptoms such as cough, fever, chills, shortness of breath, etc. between now and your scheduled surgery, please notify us  at the above number.     Remember:  Do not eat after midnight the night before your surgery  You may drink clear liquids until 7:40 the morning of your surgery.   Clear liquids allowed are: Water , Non-Citrus Juices (without pulp), Carbonated Beverages, Clear Tea (no milk, honey, etc.), Black Coffee Only (NO MILK, CREAM OR POWDERED CREAMER of any kind), and Gatorade.    Take these medicines the morning of surgery with A SIP OF WATER   esomeprazole  (NEXIUM )  famotidine (PEPCID)  gabapentin  (NEURONTIN )   May take these medicines IF NEEDED: acetaminophen  (TYLENOL )  ALPRAZolam  (XANAX )   Warfarin (COUMADIN ) HOLD 5 DAYS PRIOR TO PROCEDURE. LAST DOSE-02-03-24  One week prior to surgery, STOP taking any Aleve, Naproxen, Ibuprofen , Motrin , Advil , Goody's, BC's, all herbal medications, fish oil, and non-prescription vitamins.   THIS INCLUDES YOUR diclofenac  (VOLTAREN ).                     Do NOT Smoke (Tobacco/Vaping) for 24 hours prior to your procedure.  If you use a CPAP at night, you may bring your mask/headgear for your overnight stay.   You will be asked to remove any contacts, glasses, piercing's, hearing aid's, dentures/partials prior to surgery. Please bring cases for these items if needed.    Patients discharged the day of surgery will not be allowed to drive home, and someone needs to stay with them for 24 hours.  SURGICAL WAITING ROOM VISITATION Patients may have no more than 2 support people in the waiting area -  these visitors may rotate.   Pre-op nurse will coordinate an appropriate time for 1 ADULT support person, who may not rotate, to accompany patient in pre-op.  Children under the age of 71 must have an adult with them who is not the patient and must remain in the main waiting area with an adult.  If the patient needs to stay at the hospital during part of their recovery, the visitor guidelines for inpatient rooms apply.  Please refer to the [facility] website for the visitor guidelines for any additional information.   If you received a COVID test during your pre-op visit  it is requested that you wear a mask when out in public, stay away from anyone that may not be feeling well and notify your surgeon if you develop symptoms. If you have been in contact with anyone that has tested positive in the last 10 days please notify you surgeon.      Pre-operative 4 CHG Bathing Instructions   You can play a key role in reducing the risk of infection after surgery. Your skin needs to be as free of germs as possible. You can reduce the number of germs on your skin by washing with CHG (chlorhexidine  gluconate) soap before surgery. CHG is an antiseptic soap that kills germs and continues to kill germs even after washing.   DO NOT use if you have an allergy to chlorhexidine /CHG or antibacterial soaps. If your skin becomes reddened or irritated, stop using the CHG and notify  one of our RNs at [phone].   Please shower with the CHG soap starting 4 days before surgery using the following schedule:     Please keep in mind the following:  DO NOT shave, including legs and underarms, starting the day of your first shower.   You may shave your face at any point before/day of surgery.  Place clean sheets on your bed the day you start using CHG soap. Use a clean washcloth (not used since being washed) for each shower. DO NOT sleep with pets once you start using the CHG.   CHG Shower Instructions:  Wash  your face and private area with normal soap. If you choose to wash your hair, wash first with your normal shampoo.  After you use shampoo/soap, rinse your hair and body thoroughly to remove shampoo/soap residue.  Turn the water  OFF and apply  bottle of CHG soap to a CLEAN washcloth.  Apply CHG soap ONLY FROM YOUR NECK DOWN TO YOUR TOES (washing for 3-5 minutes)  DO NOT use CHG soap on face, private areas, open wounds, or sores.  Pay special attention to the area where your surgery is being performed.  If you are having back surgery, having someone wash your back for you may be helpful. Wait 2 minutes after CHG soap is applied, then you may rinse off the CHG soap.  Pat dry with a clean towel  Put on clean clothes/pajamas   If you choose to wear lotion, please use ONLY the CHG-compatible lotions that are listed below.  Additional instructions for the day of surgery:  If you choose, you may shower the morning of surgery with an antibacterial soap.  DO NOT APPLY any lotions, deodorants, cologne, or perfumes.   Do not bring valuables to the hospital. [facility] is not responsible for any belongings/valuables. Do not wear nail polish, gel polish, artificial nails, or any other type of covering on natural nails (fingers and toes) Do not wear jewelry or makeup Put on clean/comfortable clothes.  Please brush your teeth.  Ask your nurse before applying any prescription medications to the skin.     CHG Compatible Lotions   Aveeno Moisturizing lotion  Cetaphil Moisturizing Cream  Cetaphil Moisturizing Lotion  Clairol Herbal Essence Moisturizing Lotion, Dry Skin  Clairol Herbal Essence Moisturizing Lotion, Extra Dry Skin  Clairol Herbal Essence Moisturizing Lotion, Normal Skin  Curel Age Defying Therapeutic Moisturizing Lotion with Alpha Hydroxy  Curel Extreme Care Body Lotion  Curel Soothing Hands Moisturizing Hand Lotion  Curel Therapeutic Moisturizing Cream, Fragrance-Free  Curel  Therapeutic Moisturizing Lotion, Fragrance-Free  Curel Therapeutic Moisturizing Lotion, Original Formula  Eucerin Daily Replenishing Lotion  Eucerin Dry Skin Therapy Plus Alpha Hydroxy Crme  Eucerin Dry Skin Therapy Plus Alpha Hydroxy Lotion  Eucerin Original Crme  Eucerin Original Lotion  Eucerin Plus Crme Eucerin Plus Lotion  Eucerin TriLipid Replenishing Lotion  Keri Anti-Bacterial Hand Lotion  Keri Deep Conditioning Original Lotion Dry Skin Formula Softly Scented  Keri Deep Conditioning Original Lotion, Fragrance Free Sensitive Skin Formula  Keri Lotion Fast Absorbing Fragrance Free Sensitive Skin Formula  Keri Lotion Fast Absorbing Softly Scented Dry Skin Formula  Keri Original Lotion  Keri Skin Renewal Lotion Keri Silky Smooth Lotion  Keri Silky Smooth Sensitive Skin Lotion  Nivea Body Creamy Conditioning Oil  Nivea Body Extra Enriched Teacher, Adult Education Moisturizing Lotion Nivea Crme  Nivea Skin Firming Lotion  NutraDerm 30 Skin Lotion  NutraDerm Skin Lotion  NutraDerm Therapeutic Skin Cream  NutraDerm Therapeutic Skin Lotion  ProShield Protective Hand Cream  Provon moisturizing lotion  Please read over the following fact sheets that you were given.

## 2024-02-02 ENCOUNTER — Other Ambulatory Visit: Payer: Self-pay

## 2024-02-02 ENCOUNTER — Inpatient Hospital Stay (HOSPITAL_COMMUNITY)
Admission: RE | Admit: 2024-02-02 | Discharge: 2024-02-02 | Disposition: A | Source: Ambulatory Visit | Attending: Orthopedic Surgery

## 2024-02-02 ENCOUNTER — Encounter (HOSPITAL_COMMUNITY): Payer: Self-pay

## 2024-02-02 VITALS — BP 109/53 | HR 71 | Temp 98.1°F | Resp 18 | Ht <= 58 in | Wt 141.7 lb

## 2024-02-02 DIAGNOSIS — L4050 Arthropathic psoriasis, unspecified: Secondary | ICD-10-CM | POA: Diagnosis not present

## 2024-02-02 DIAGNOSIS — K219 Gastro-esophageal reflux disease without esophagitis: Secondary | ICD-10-CM | POA: Diagnosis not present

## 2024-02-02 DIAGNOSIS — Z01818 Encounter for other preprocedural examination: Secondary | ICD-10-CM

## 2024-02-02 DIAGNOSIS — Z96653 Presence of artificial knee joint, bilateral: Secondary | ICD-10-CM | POA: Diagnosis not present

## 2024-02-02 DIAGNOSIS — M310 Hypersensitivity angiitis: Secondary | ICD-10-CM | POA: Diagnosis not present

## 2024-02-02 DIAGNOSIS — K589 Irritable bowel syndrome without diarrhea: Secondary | ICD-10-CM | POA: Diagnosis not present

## 2024-02-02 DIAGNOSIS — Z96611 Presence of right artificial shoulder joint: Secondary | ICD-10-CM | POA: Diagnosis not present

## 2024-02-02 DIAGNOSIS — D649 Anemia, unspecified: Secondary | ICD-10-CM | POA: Diagnosis not present

## 2024-02-02 DIAGNOSIS — M5416 Radiculopathy, lumbar region: Secondary | ICD-10-CM | POA: Diagnosis present

## 2024-02-02 DIAGNOSIS — M4726 Other spondylosis with radiculopathy, lumbar region: Secondary | ICD-10-CM | POA: Diagnosis not present

## 2024-02-02 DIAGNOSIS — Z7901 Long term (current) use of anticoagulants: Secondary | ICD-10-CM | POA: Diagnosis not present

## 2024-02-02 DIAGNOSIS — M47816 Spondylosis without myelopathy or radiculopathy, lumbar region: Secondary | ICD-10-CM | POA: Diagnosis present

## 2024-02-02 DIAGNOSIS — K449 Diaphragmatic hernia without obstruction or gangrene: Secondary | ICD-10-CM | POA: Diagnosis not present

## 2024-02-02 DIAGNOSIS — I10 Essential (primary) hypertension: Secondary | ICD-10-CM | POA: Diagnosis not present

## 2024-02-02 DIAGNOSIS — I7300 Raynaud's syndrome without gangrene: Secondary | ICD-10-CM | POA: Diagnosis not present

## 2024-02-02 DIAGNOSIS — Z01812 Encounter for preprocedural laboratory examination: Secondary | ICD-10-CM | POA: Diagnosis not present

## 2024-02-02 DIAGNOSIS — Z9889 Other specified postprocedural states: Secondary | ICD-10-CM | POA: Diagnosis not present

## 2024-02-02 DIAGNOSIS — Z86711 Personal history of pulmonary embolism: Secondary | ICD-10-CM | POA: Diagnosis not present

## 2024-02-02 LAB — COMPREHENSIVE METABOLIC PANEL WITH GFR
ALT: 26 U/L (ref 0–44)
AST: 37 U/L (ref 15–41)
Albumin: 2.7 g/dL — ABNORMAL LOW (ref 3.5–5.0)
Alkaline Phosphatase: 105 U/L (ref 38–126)
Anion gap: 6 (ref 5–15)
BUN: 12 mg/dL (ref 8–23)
CO2: 27 mmol/L (ref 22–32)
Calcium: 8.1 mg/dL — ABNORMAL LOW (ref 8.9–10.3)
Chloride: 102 mmol/L (ref 98–111)
Creatinine, Ser: 0.48 mg/dL (ref 0.44–1.00)
GFR, Estimated: >60 mL/min (ref >60)
Glucose, Bld: 97 mg/dL (ref 70–99)
Potassium: 4.9 mmol/L (ref 3.5–5.1)
Sodium: 135 mmol/L (ref 135–145)
Total Bilirubin: 0.9 mg/dL (ref 0.0–1.2)
Total Protein: 5.3 g/dL — ABNORMAL LOW (ref 6.5–8.1)

## 2024-02-02 LAB — CBC
HCT: 36.4 % (ref 36.0–46.0)
Hemoglobin: 11.4 g/dL — ABNORMAL LOW (ref 12.0–15.0)
MCH: 26.5 pg (ref 26.0–34.0)
MCHC: 31.3 g/dL (ref 30.0–36.0)
MCV: 84.7 fL (ref 80.0–100.0)
Platelets: 270 K/uL (ref 150–400)
RBC: 4.3 MIL/uL (ref 3.87–5.11)
RDW: 14.4 % (ref 11.5–15.5)
WBC: 5.1 K/uL (ref 4.0–10.5)
nRBC: 0 % (ref 0.0–0.2)

## 2024-02-02 LAB — TYPE AND SCREEN
ABO/RH(D): A POS
Antibody Screen: NEGATIVE

## 2024-02-02 LAB — SURGICAL PCR SCREEN
MRSA, PCR: NEGATIVE
Staphylococcus aureus: NEGATIVE

## 2024-02-02 NOTE — Progress Notes (Addendum)
 PCP - Camie Neal,MD Cardiologist - Alean Madireddy,MD  PPM/ICD - denies Device Orders -  Rep Notified -   Chest x-ray -  EKG - 12/19/23 Stress Test - 2023 ECHO - 12/05/23 Cardiac Cath - denies  Sleep Study - denies CPAP -   Fasting Blood Sugar - na Checks Blood Sugar _____ times a day  Last dose of GLP1 agonist- naa  GLP1 instructions:na  Blood Thinner Instructions:Hold Warfarin 5 days prior to surgery. Last dose will be Dec. 16. Aspirin Instructions:na  ERAS Protcol -clear liquids until 0740 PRE-SURGERY Ensure or G2- na  COVID TEST- na   Anesthesia review: yes-medical clearance, cardiac clearance, hx pulmonary embolus: connective tissue disorder with psoriatic or rheumatoid arthritis  Patient denies shortness of breath, fever, cough and chest pain at PAT appointment   All instructions explained to the patient, with a verbal understanding of the material. Patient agrees to go over the instructions while at home for a better understanding.  The opportunity to ask questions was provided.

## 2024-02-03 ENCOUNTER — Ambulatory Visit (HOSPITAL_COMMUNITY): Payer: Self-pay | Admitting: Physician Assistant

## 2024-02-03 NOTE — Progress Notes (Signed)
 Anesthesia Chart Review:  Case: [id number] Date/Time: 02/09/24 1025   Procedures:      POSTERIOR LUMBAR FUSION 2 LEVEL - RT L3-5 hemilaminectomy and facetectomy with synovial cyst    L4-5  excision and in situ fusion     LAMINECTOMY, SPINE, LUMBAR, WITH EXTRADURAL LESION EXCISION   Anesthesia type: General   Diagnosis:      Lumbar radiculopathy [M54.16]     Spondylosis of lumbar joint [M47.816]   Pre-op diagnosis: lumbar radiculopathy, spondylosis of lumbar   [facility] ROOM 04 / [facility]   Surgeons: Burnetta Aures, MD       DISCUSSION: Patient is a 73 year old female scheduled for the above procedure.  History includes never smoker, postoperative N/V (1980's), HTN, palpitations, IBS, anemia, GERD, hiatal hernia, leukocytoclastic vasculitis, PE (submassive 12/03/2016), Raynaud's phenomenon, psoriatic arthritis, osteoarthritis (left TKA 11/01/1999, revision 11/15/2003 & 03/02/2007; right shoulder hemiarthroplasty 06/28/2016; right TKA 03/05/2021), hysterectomy (2002), obesity (s/p gastric bypass 1978), cholecystectomy (04/24/2000), hernia [facility] 09/27/2002).  She is not routinely followed by cardiology, but she had a preoperative evaluation by Dr. Liborio on 12/19/2023. He has known coronary atherosclerosis on prior CT imaging. Non-ischemic stress test in 2023. Echo in 2018 showed LVEF 55-60%, grade 1 DD. He wrote, Preop cardiovascular risk assessment prior to elective back surgery. Functional status had been good until February 2025 after which she has been somewhat limited due to her back pain.  She is kept active including mowing her yard with an electric push mower without any significant limitation.   From cardiac standpoint she appears to meet more than 4 METS activity. Low risk for preoperative cardiac complications.   Okay to proceed with her elective surgery as being planned. No additional cardiac workup required at this time. Defer HLD management to PCP (recommended target LDL < 70). On  warfarin for PE history, managed by primary care. As needed cardiology follow-up.    Primary care clearance notation by Rosalynn Camie CROME, MD on 01/02/2024, Will clear for spine surgery, stop warfarin 5 days prior, recommend INR check night before or morningof surgery, and can restart warfarin 12-24 hours postop. She is to follow-up in clinic for post-surgery INR.   Anesthesia team to evaluate on the day of surgery.   VS: BP (!) 109/53   Pulse 71   Temp 36.7 C (Oral)   Resp 18   Ht 4' 10 (1.473 m)   Wt 64.3 kg   SpO2 98%   BMI 29.62 kg/m    PROVIDERS: Rosalynn Camie CROME, MD is PCP Madireddy, Alean, MD is cardiologist. As needed follow-up 12/19/2023 visit.  Ishmael Slough, MD is rheumatologist - Evaluated by Cala Retort, MD in [phone] following PE. Unclear etiology of thrombosis. Vasculitis a risk factor. Lab work demonstrated no indefatigable abnormalities to suggest presence of antiphospholipid antibody syndrome, antithrombin, protein C, or protein S deficiency. Patient also tested negative for presence of prothrombin gene or factor five Leiden mutations. Indefinite anticoagulation recommended due to significant risk of recurrent thrombosis in the severity of the initial presentation.   LABS: Preoperative labs noted. INR 2.0 on 01/29/2024. She is for PT/INR on the day of surgery.  (all labs ordered are listed, but only abnormal results are displayed)  Labs Reviewed  CBC - Abnormal; Notable for the following components:      Result Value   Hemoglobin 11.4 (*)    All other components within normal limits  COMPREHENSIVE METABOLIC PANEL WITH GFR - Abnormal; Notable for the following components:   Calcium  8.1 (*)    Total Protein 5.3 (*)    Albumin 2.7 (*)    All other components within normal limits  SURGICAL PCR SCREEN  TYPE AND SCREEN     IMAGES: US  Renal 01/02/2024: IMPRESSION: 1. Similar dilated left renal pelvis and proximal hydroureter compared to the  12/29/2021 CT. 2. No other acute finding by ultrasound.    EKG: 12/19/2023: NSR   CV: Nuclear stress test 02/27/2021:   Lexiscan  stress is electrically negative for ischemia   Myoview  scan shows normal perfusion and very mild soft tissue attenuation   No ischemia or scar   LVEF 60%   Prior study not available for comparison.   Low risk scan   Echo 12/04/2016: Study Conclusions  - Left ventricle: The cavity size was normal. Wall thickness was    normal. Systolic function was normal. The estimated ejection    fraction was in the range of 55% to 60%. Wall motion was normal;    there were no regional wall motion abnormalities. Doppler    parameters are consistent with abnormal left ventricular    relaxation (grade 1 diastolic dysfunction).   Impressions:  - Definity used; normal LV systolic function; mild diastolic    dysfunction; calcified papillary muscle noted.   Past Medical History:  Diagnosis Date   Anemia    take iron   Anxiety    Arthritis    Depression    Dysrhythmia 2016   occ due to caffeine intake   Encounter for long-term (current) use of other medications    GERD (gastroesophageal reflux disease)    Hepatitis    age 56   History of hiatal hernia    Hypertension    IBS (irritable bowel syndrome)    Obesity    Osteoarthritis    Pain in joint, multiple sites    Pneumonia 2010   PONV (postoperative nausea and vomiting)    in the 1980's no problems since   Psoriatic arthropathy (HCC)    Pulmonary embolism (HCC) 12/03/2016   submassive/notes 12/03/2016   Raynaud disease     Past Surgical History:  Procedure Laterality Date   ABDOMINAL HYSTERECTOMY  03/1998   APPENDECTOMY     with gastri bypass   CESAREAN SECTION  1982   COLONOSCOPY     GASTRIC BYPASS  1978   HALLUX VALGUS CORRECTION Right 09/2010   Dr Shonna; right 1st ray   LAPAROSCOPIC CHOLECYSTECTOMY  04/2000   thelbert 07/03/2010   LAPAROSCOPIC INCISIONAL / UMBILICAL / VENTRAL HERNIA REPAIR   09/2002   VHR/notes 07/03/2010   REVISION TOTAL KNEE ARTHROPLASTY Left 10/2003   thelbert 07/03/2010   REVISION TOTAL KNEE ARTHROPLASTY Left 02/2007   thelbert 06/21/2010   SHOULDER HEMI-ARTHROPLASTY Right 06/28/2016   Procedure: RIGHT SHOULDER HEMI-ARTHROPLASTY;  Surgeon: Kay Kemps, MD;  Location: [facility] OR;  Service: Orthopedics;  Laterality: Right;   TOTAL KNEE ARTHROPLASTY Left 10/1999   thelbert 07/03/2010   TOTAL KNEE ARTHROPLASTY Right 03/05/2021   Procedure: TOTAL KNEE ARTHROPLASTY;  Surgeon: Melodi Lerner, MD;  Location: [facility] ORS;  Service: Orthopedics;  Laterality: Right;   TUBAL LIGATION  1982    MEDICATIONS:  acetaminophen  (TYLENOL ) 500 MG tablet   ALPRAZolam  (XANAX ) 0.5 MG tablet   benazepril  (LOTENSIN ) 20 MG tablet   Cholecalciferol  (VITAMIN D -3) 125 MCG (5000 UT) TABS   cyanocobalamin  (VITAMIN B12) 1000 MCG tablet   diclofenac  (VOLTAREN ) 75 MG EC tablet   esomeprazole  (NEXIUM ) 40 MG capsule   famotidine (PEPCID) 40 MG tablet  fluconazole  (DIFLUCAN ) 100 MG tablet   furosemide  (LASIX ) 20 MG tablet   gabapentin  (NEURONTIN ) 100 MG capsule   QUEtiapine  (SEROQUEL ) 25 MG tablet   venlafaxine  XR (EFFEXOR -XR) 150 MG 24 hr capsule   venlafaxine  XR (EFFEXOR -XR) 75 MG 24 hr capsule   warfarin (COUMADIN ) 2 MG tablet   No current facility-administered medications for this encounter.    Isaiah Ruder, PA-C Surgical Short Stay/Anesthesiology [facility] Phone [phone] [facility] Phone [phone] 02/03/2024 5:37 PM

## 2024-02-03 NOTE — Anesthesia Preprocedure Evaluation (Signed)
 Anesthesia Evaluation    Airway        Dental   Pulmonary           Cardiovascular hypertension,      Neuro/Psych    GI/Hepatic   Endo/Other    Renal/GU      Musculoskeletal   Abdominal   Peds  Hematology   Anesthesia Other Findings   Reproductive/Obstetrics                              Anesthesia Physical Anesthesia Plan  ASA:   Anesthesia Plan:    Post-op Pain Management:    Induction:   PONV Risk Score and Plan:   Airway Management Planned:   Additional Equipment:   Intra-op Plan:   Post-operative Plan:   Informed Consent:   Plan Discussed with:   Anesthesia Plan Comments: (PAT note written 02/03/2024 by Nicki Gracy, PA-C.  )        Anesthesia Quick Evaluation

## 2024-02-09 ENCOUNTER — Inpatient Hospital Stay (HOSPITAL_COMMUNITY)

## 2024-02-09 ENCOUNTER — Encounter (HOSPITAL_COMMUNITY): Payer: Self-pay | Admitting: Vascular Surgery

## 2024-02-09 ENCOUNTER — Other Ambulatory Visit: Payer: Self-pay

## 2024-02-09 ENCOUNTER — Inpatient Hospital Stay (HOSPITAL_COMMUNITY)
Admission: RE | Admit: 2024-02-09 | Discharge: 2024-02-12 | Disposition: A | Discharge status: Home / Self Care | Attending: Orthopedic Surgery | Admitting: Orthopedic Surgery

## 2024-02-09 ENCOUNTER — Encounter (HOSPITAL_COMMUNITY): Payer: Self-pay | Admitting: Orthopedic Surgery

## 2024-02-09 ENCOUNTER — Encounter (HOSPITAL_COMMUNITY)
Admission: RE | Disposition: A | Discharge status: Home Health | Payer: Self-pay | Source: Home / Self Care | Attending: Orthopedic Surgery

## 2024-02-09 ENCOUNTER — Inpatient Hospital Stay (HOSPITAL_COMMUNITY): Admitting: Anesthesiology

## 2024-02-09 DIAGNOSIS — I10 Essential (primary) hypertension: Secondary | ICD-10-CM | POA: Diagnosis present

## 2024-02-09 DIAGNOSIS — Z79899 Other long term (current) drug therapy: Secondary | ICD-10-CM

## 2024-02-09 DIAGNOSIS — Z91048 Other nonmedicinal substance allergy status: Secondary | ICD-10-CM | POA: Diagnosis not present

## 2024-02-09 DIAGNOSIS — Z885 Allergy status to narcotic agent status: Secondary | ICD-10-CM | POA: Diagnosis not present

## 2024-02-09 DIAGNOSIS — M4156 Other secondary scoliosis, lumbar region: Secondary | ICD-10-CM | POA: Diagnosis present

## 2024-02-09 DIAGNOSIS — Z9102 Food additives allergy status: Secondary | ICD-10-CM | POA: Diagnosis not present

## 2024-02-09 DIAGNOSIS — K219 Gastro-esophageal reflux disease without esophagitis: Secondary | ICD-10-CM | POA: Diagnosis present

## 2024-02-09 DIAGNOSIS — F418 Other specified anxiety disorders: Secondary | ICD-10-CM

## 2024-02-09 DIAGNOSIS — Z881 Allergy status to other antibiotic agents status: Secondary | ICD-10-CM | POA: Diagnosis not present

## 2024-02-09 DIAGNOSIS — M5116 Intervertebral disc disorders with radiculopathy, lumbar region: Secondary | ICD-10-CM | POA: Diagnosis present

## 2024-02-09 DIAGNOSIS — I2510 Atherosclerotic heart disease of native coronary artery without angina pectoris: Secondary | ICD-10-CM | POA: Diagnosis not present

## 2024-02-09 DIAGNOSIS — M7138 Other bursal cyst, other site: Secondary | ICD-10-CM | POA: Diagnosis present

## 2024-02-09 DIAGNOSIS — F32A Depression, unspecified: Secondary | ICD-10-CM | POA: Diagnosis present

## 2024-02-09 DIAGNOSIS — L4050 Arthropathic psoriasis, unspecified: Secondary | ICD-10-CM | POA: Diagnosis present

## 2024-02-09 DIAGNOSIS — Z880 Allergy status to penicillin: Secondary | ICD-10-CM | POA: Diagnosis not present

## 2024-02-09 DIAGNOSIS — Z86711 Personal history of pulmonary embolism: Secondary | ICD-10-CM

## 2024-02-09 DIAGNOSIS — Z91018 Allergy to other foods: Secondary | ICD-10-CM

## 2024-02-09 DIAGNOSIS — M48061 Spinal stenosis, lumbar region without neurogenic claudication: Principal | ICD-10-CM | POA: Diagnosis present

## 2024-02-09 DIAGNOSIS — Z981 Arthrodesis status: Principal | ICD-10-CM

## 2024-02-09 DIAGNOSIS — M5416 Radiculopathy, lumbar region: Secondary | ICD-10-CM | POA: Diagnosis present

## 2024-02-09 DIAGNOSIS — M4726 Other spondylosis with radiculopathy, lumbar region: Secondary | ICD-10-CM | POA: Diagnosis not present

## 2024-02-09 DIAGNOSIS — F419 Anxiety disorder, unspecified: Secondary | ICD-10-CM | POA: Diagnosis present

## 2024-02-09 DIAGNOSIS — Z7901 Long term (current) use of anticoagulants: Secondary | ICD-10-CM | POA: Diagnosis not present

## 2024-02-09 DIAGNOSIS — Z888 Allergy status to other drugs, medicaments and biological substances status: Secondary | ICD-10-CM | POA: Diagnosis not present

## 2024-02-09 DIAGNOSIS — I7300 Raynaud's syndrome without gangrene: Secondary | ICD-10-CM | POA: Diagnosis present

## 2024-02-09 LAB — PROTIME-INR
INR: 1 (ref 0.8–1.2)
Prothrombin Time: 13.4 s (ref 11.4–15.2)

## 2024-02-09 SURGERY — POSTERIOR LUMBAR FUSION 2 LEVEL
Anesthesia: General

## 2024-02-09 MED ORDER — MENTHOL 3 MG MT LOZG: 1.0000 | LOZENGE | OROMUCOSAL | Status: DC | PRN

## 2024-02-09 MED ORDER — ONDANSETRON HCL 4 MG/2ML IJ SOLN
4.0000 mg | Freq: Four times a day (QID) | INTRAMUSCULAR | Status: DC | PRN
  Filled 2024-02-09: qty 2

## 2024-02-09 MED ORDER — FUROSEMIDE 40 MG PO TABS
40.0000 mg | ORAL_TABLET | Freq: Two times a day (BID) | ORAL | Status: DC
  Administered 2024-02-11: 40 mg via ORAL
  Filled 2024-02-09 (×4): qty 1

## 2024-02-09 MED ORDER — ALBUMIN HUMAN 5 % IV SOLN: INTRAVENOUS | Status: DC | PRN

## 2024-02-09 MED ORDER — POLYETHYLENE GLYCOL 3350 17 G PO PACK: 17.0000 g | PACK | Freq: Every day | ORAL | Status: DC | PRN

## 2024-02-09 MED ORDER — MIDAZOLAM HCL (PF) 2 MG/2ML IJ SOLN
INTRAMUSCULAR | Status: DC | PRN
  Administered 2024-02-09: 1 mg via INTRAVENOUS

## 2024-02-09 MED ORDER — PHENYLEPHRINE HCL-NACL 20-0.9 MG/250ML-% IV SOLN
INTRAVENOUS | Status: DC | PRN
  Administered 2024-02-09: 70 ug/min via INTRAVENOUS

## 2024-02-09 MED ORDER — QUETIAPINE FUMARATE 25 MG PO TABS
25.0000 mg | ORAL_TABLET | Freq: Every day | ORAL | Status: DC
  Administered 2024-02-09 – 2024-02-11 (×3): 25 mg via ORAL
  Filled 2024-02-09 (×4): qty 1

## 2024-02-09 MED ORDER — ALPRAZOLAM 0.25 MG PO TABS
0.5000 mg | ORAL_TABLET | Freq: Every day | ORAL | Status: DC | PRN
  Administered 2024-02-11: 0.5 mg via ORAL
  Filled 2024-02-09: qty 1

## 2024-02-09 MED ORDER — PROPOFOL 10 MG/ML IV BOLUS
INTRAVENOUS | Status: DC | PRN
  Administered 2024-02-09: 100 mg via INTRAVENOUS

## 2024-02-09 MED ORDER — SODIUM CHLORIDE 0.9 % IV SOLN: 250.0000 mL | INTRAVENOUS | Status: AC

## 2024-02-09 MED ORDER — CHLORHEXIDINE GLUCONATE 0.12 % MT SOLN
15.0000 mL | Freq: Once | OROMUCOSAL | Status: AC
  Administered 2024-02-09: 15 mL via OROMUCOSAL
  Filled 2024-02-09: qty 15

## 2024-02-09 MED ORDER — ALPRAZOLAM 0.5 MG PO TABS: 0.5000 mg | ORAL_TABLET | ORAL | Status: DC

## 2024-02-09 MED ORDER — TRANEXAMIC ACID-NACL 1000-0.7 MG/100ML-% IV SOLN
INTRAVENOUS | Status: DC | PRN
  Administered 2024-02-09: 1000 mg via INTRAVENOUS

## 2024-02-09 MED ORDER — THROMBIN 20000 UNITS EX SOLR
CUTANEOUS | Status: AC
  Filled 2024-02-09: qty 20000

## 2024-02-09 MED ORDER — PROPOFOL 10 MG/ML IV BOLUS
INTRAVENOUS | Status: AC
  Filled 2024-02-09: qty 20

## 2024-02-09 MED ORDER — OXYCODONE HCL 5 MG PO TABS
5.0000 mg | ORAL_TABLET | ORAL | Status: DC | PRN
  Administered 2024-02-09 – 2024-02-11 (×2): 5 mg via ORAL
  Filled 2024-02-09 (×2): qty 1

## 2024-02-09 MED ORDER — LACTATED RINGERS IV SOLN: INTRAVENOUS | Status: DC

## 2024-02-09 MED ORDER — DEXAMETHASONE SOD PHOSPHATE PF 10 MG/ML IJ SOLN
INTRAMUSCULAR | Status: DC | PRN
  Administered 2024-02-09: 5 mg via INTRAVENOUS

## 2024-02-09 MED ORDER — OXYCODONE HCL 5 MG PO TABS
10.0000 mg | ORAL_TABLET | ORAL | Status: DC | PRN
  Administered 2024-02-09 – 2024-02-10 (×5): 10 mg via ORAL
  Filled 2024-02-09 (×6): qty 2

## 2024-02-09 MED ORDER — EPHEDRINE SULFATE-NACL 50-0.9 MG/10ML-% IV SOSY
PREFILLED_SYRINGE | INTRAVENOUS | Status: DC | PRN
  Administered 2024-02-09: 5 mg via INTRAVENOUS
  Administered 2024-02-09: 10 mg via INTRAVENOUS

## 2024-02-09 MED ORDER — VENLAFAXINE HCL ER 75 MG PO CP24
75.0000 mg | ORAL_CAPSULE | Freq: Every day | ORAL | Status: DC
  Administered 2024-02-10: 75 mg via ORAL
  Filled 2024-02-09: qty 1

## 2024-02-09 MED ORDER — FENTANYL CITRATE (PF) 250 MCG/5ML IJ SOLN
INTRAMUSCULAR | Status: DC | PRN
  Administered 2024-02-09: 100 ug via INTRAVENOUS

## 2024-02-09 MED ORDER — VANCOMYCIN HCL 500 MG/100ML IV SOLN
500.0000 mg | Freq: Once | INTRAVENOUS | Status: AC
  Administered 2024-02-09: 500 mg via INTRAVENOUS
  Filled 2024-02-09: qty 100

## 2024-02-09 MED ORDER — FENTANYL CITRATE (PF) 100 MCG/2ML IJ SOLN
INTRAMUSCULAR | Status: AC
  Filled 2024-02-09: qty 2

## 2024-02-09 MED ORDER — 0.9 % SODIUM CHLORIDE (POUR BTL) OPTIME
TOPICAL | Status: DC | PRN
  Administered 2024-02-09: 1000 mL

## 2024-02-09 MED ORDER — HYDROMORPHONE HCL 1 MG/ML IJ SOLN
INTRAMUSCULAR | Status: AC
  Filled 2024-02-09: qty 0.5

## 2024-02-09 MED ORDER — METHOCARBAMOL 500 MG PO TABS
500.0000 mg | ORAL_TABLET | Freq: Four times a day (QID) | ORAL | Status: DC | PRN
  Administered 2024-02-09 – 2024-02-12 (×10): 500 mg via ORAL
  Filled 2024-02-09 (×10): qty 1

## 2024-02-09 MED ORDER — ACETAMINOPHEN 325 MG PO TABS
650.0000 mg | ORAL_TABLET | ORAL | Status: DC | PRN
  Administered 2024-02-10: 650 mg via ORAL
  Filled 2024-02-09: qty 2

## 2024-02-09 MED ORDER — ONDANSETRON HCL 4 MG PO TABS
4.0000 mg | ORAL_TABLET | Freq: Four times a day (QID) | ORAL | Status: DC | PRN
  Administered 2024-02-11 – 2024-02-12 (×3): 4 mg via ORAL
  Filled 2024-02-09 (×3): qty 1

## 2024-02-09 MED ORDER — BUPIVACAINE-EPINEPHRINE (PF) 0.25% -1:200000 IJ SOLN
INTRAMUSCULAR | Status: AC
  Filled 2024-02-09: qty 30

## 2024-02-09 MED ORDER — OXYCODONE HCL 5 MG PO TABS: 5.0000 mg | ORAL_TABLET | Freq: Once | ORAL | Status: DC | PRN

## 2024-02-09 MED ORDER — VANCOMYCIN HCL IN DEXTROSE 1-5 GM/200ML-% IV SOLN
1000.0000 mg | INTRAVENOUS | Status: AC
  Administered 2024-02-09: 1000 mg via INTRAVENOUS
  Filled 2024-02-09: qty 200

## 2024-02-09 MED ORDER — OXYCODONE HCL 5 MG/5ML PO SOLN: 5.0000 mg | Freq: Once | ORAL | Status: DC | PRN

## 2024-02-09 MED ORDER — FENTANYL CITRATE (PF) 100 MCG/2ML IJ SOLN
25.0000 ug | INTRAMUSCULAR | Status: DC | PRN
  Administered 2024-02-09: 50 ug via INTRAVENOUS
  Administered 2024-02-09 (×2): 25 ug via INTRAVENOUS

## 2024-02-09 MED ORDER — ACETAMINOPHEN 650 MG RE SUPP: 650.0000 mg | RECTAL | Status: DC | PRN

## 2024-02-09 MED ORDER — BENAZEPRIL HCL 20 MG PO TABS
20.0000 mg | ORAL_TABLET | Freq: Every day | ORAL | Status: DC
  Administered 2024-02-09 – 2024-02-12 (×3): 20 mg via ORAL
  Filled 2024-02-09 (×4): qty 1

## 2024-02-09 MED ORDER — ALPRAZOLAM 0.25 MG PO TABS
1.0000 mg | ORAL_TABLET | Freq: Every day | ORAL | Status: DC
  Administered 2024-02-09 – 2024-02-11 (×3): 1 mg via ORAL
  Filled 2024-02-09 (×2): qty 2
  Filled 2024-02-09: qty 4

## 2024-02-09 MED ORDER — SUGAMMADEX SODIUM 200 MG/2ML IV SOLN
INTRAVENOUS | Status: DC | PRN
  Administered 2024-02-09: 200 mg via INTRAVENOUS

## 2024-02-09 MED ORDER — METHOCARBAMOL 1000 MG/10ML IJ SOLN: 500.0000 mg | Freq: Four times a day (QID) | INTRAMUSCULAR | Status: DC | PRN

## 2024-02-09 MED ORDER — VANCOMYCIN HCL 500 MG IV SOLR
INTRAVENOUS | Status: AC
  Filled 2024-02-09: qty 10

## 2024-02-09 MED ORDER — THROMBIN 20000 UNITS EX SOLR
CUTANEOUS | Status: DC | PRN
  Administered 2024-02-09: 20 mL via TOPICAL

## 2024-02-09 MED ORDER — BUPIVACAINE-EPINEPHRINE 0.25% -1:200000 IJ SOLN
INTRAMUSCULAR | Status: DC | PRN
  Administered 2024-02-09: 10 mL

## 2024-02-09 MED ORDER — HYDROMORPHONE HCL 1 MG/ML IJ SOLN: 0.5000 mg | INTRAMUSCULAR | Status: AC | PRN

## 2024-02-09 MED ORDER — MIDAZOLAM HCL 2 MG/2ML IJ SOLN
INTRAMUSCULAR | Status: AC
  Filled 2024-02-09: qty 2

## 2024-02-09 MED ORDER — PHENYLEPHRINE 80 MCG/ML (10ML) SYRINGE FOR IV PUSH (FOR BLOOD PRESSURE SUPPORT)
PREFILLED_SYRINGE | INTRAVENOUS | Status: DC | PRN
  Administered 2024-02-09: 160 ug via INTRAVENOUS

## 2024-02-09 MED ORDER — ORAL CARE MOUTH RINSE: 15.0000 mL | Freq: Once | OROMUCOSAL | Status: AC

## 2024-02-09 MED ORDER — ACETAMINOPHEN 500 MG PO TABS
1000.0000 mg | ORAL_TABLET | Freq: Once | ORAL | Status: DC
  Filled 2024-02-09: qty 2

## 2024-02-09 MED ORDER — SODIUM CHLORIDE 0.9% FLUSH: 3.0000 mL | INTRAVENOUS | Status: DC | PRN

## 2024-02-09 MED ORDER — PHENOL 1.4 % MT LIQD: 1.0000 | OROMUCOSAL | Status: DC | PRN

## 2024-02-09 MED ORDER — HYDROMORPHONE HCL 1 MG/ML IJ SOLN
INTRAMUSCULAR | Status: DC | PRN
  Administered 2024-02-09: .5 mg via INTRAVENOUS

## 2024-02-09 MED ORDER — SENNOSIDES-DOCUSATE SODIUM 8.6-50 MG PO TABS
1.0000 | ORAL_TABLET | Freq: Two times a day (BID) | ORAL | Status: DC | PRN
  Filled 2024-02-09: qty 2

## 2024-02-09 MED ORDER — DEXMEDETOMIDINE HCL IN NACL 80 MCG/20ML IV SOLN
INTRAVENOUS | Status: DC | PRN
  Administered 2024-02-09: 8 ug via INTRAVENOUS

## 2024-02-09 MED ORDER — ROCURONIUM BROMIDE 10 MG/ML (PF) SYRINGE
PREFILLED_SYRINGE | INTRAVENOUS | Status: DC | PRN
  Administered 2024-02-09: 20 mg via INTRAVENOUS
  Administered 2024-02-09: 30 mg via INTRAVENOUS
  Administered 2024-02-09: 50 mg via INTRAVENOUS
  Administered 2024-02-09: 10 mg via INTRAVENOUS

## 2024-02-09 MED ORDER — ONDANSETRON HCL 4 MG/2ML IJ SOLN
INTRAMUSCULAR | Status: DC | PRN
  Administered 2024-02-09: 4 mg via INTRAVENOUS

## 2024-02-09 MED ORDER — TRANEXAMIC ACID-NACL 1000-0.7 MG/100ML-% IV SOLN
INTRAVENOUS | Status: AC
  Filled 2024-02-09: qty 100

## 2024-02-09 MED ORDER — VENLAFAXINE HCL ER 75 MG PO CP24
150.0000 mg | ORAL_CAPSULE | Freq: Every day | ORAL | Status: DC
  Administered 2024-02-09 – 2024-02-11 (×3): 150 mg via ORAL
  Filled 2024-02-09 (×2): qty 2
  Filled 2024-02-09 (×3): qty 1
  Filled 2024-02-09: qty 2

## 2024-02-09 MED ORDER — LIDOCAINE 2% (20 MG/ML) 5 ML SYRINGE
INTRAMUSCULAR | Status: DC | PRN
  Administered 2024-02-09: 25 mg via INTRAVENOUS

## 2024-02-09 MED ORDER — SODIUM CHLORIDE 0.9% FLUSH
3.0000 mL | Freq: Two times a day (BID) | INTRAVENOUS | Status: DC
  Administered 2024-02-09 (×2): 3 mL via INTRAVENOUS

## 2024-02-09 MED ORDER — AMISULPRIDE (ANTIEMETIC) 5 MG/2ML IV SOLN: 10.0000 mg | Freq: Once | INTRAVENOUS | Status: DC | PRN

## 2024-02-09 MED ORDER — MAGNESIUM CITRATE PO SOLN: 1.0000 | Freq: Once | ORAL | Status: DC | PRN

## 2024-02-09 SURGICAL SUPPLY — 52 items
ALLOGRFT BNE OSSIFUSE FBR 5CC (Bone Implant) IMPLANT
BAG COUNTER SPONGE SURGICOUNT (BAG) ×1 IMPLANT
BLADE CLIPPER SURG (BLADE) IMPLANT
BUR EGG ELITE 4.0 (BURR) ×1 IMPLANT
BUR MATCHSTICK NEURO 3.0X3.8 (BURR) ×1 IMPLANT
CABLE BIPOLOR RESECTION CORD (MISCELLANEOUS) ×1 IMPLANT
CLSR STERI-STRIP ANTIMIC 1/2X4 (GAUZE/BANDAGES/DRESSINGS) ×1 IMPLANT
COVER MAYO STAND STRL (DRAPES) IMPLANT
COVER SURGICAL LIGHT HANDLE (MISCELLANEOUS) ×1 IMPLANT
DRAPE C-ARM 42X72 X-RAY (DRAPES) ×1 IMPLANT
DRAPE C-ARMOR (DRAPES) ×1 IMPLANT
DRAPE SURG 17X23 STRL (DRAPES) ×1 IMPLANT
DRAPE U-SHAPE 47X51 STRL (DRAPES) ×1 IMPLANT
DRSG OPSITE POSTOP 4X8 (GAUZE/BANDAGES/DRESSINGS) ×1 IMPLANT
DURAPREP 26ML APPLICATOR (WOUND CARE) ×1 IMPLANT
ELECT BLADE 6.5 EXT (BLADE) ×1 IMPLANT
ELECT CAUTERY BLADE 6.4 (BLADE) ×1 IMPLANT
ELECT PENCIL ROCKER SW 15FT (MISCELLANEOUS) ×1 IMPLANT
ELECTRODE BLDE 4.0 EZ CLN MEGD (MISCELLANEOUS) IMPLANT
ELECTRODE REM PT RTRN 9FT ADLT (ELECTROSURGICAL) ×1 IMPLANT
GLOVE BIOGEL PI IND STRL 8.5 (GLOVE) ×1 IMPLANT
GLOVE SS BIOGEL STRL SZ 8.5 (GLOVE) ×1 IMPLANT
GOWN STRL REUS W/TWL 2XL LVL3 (GOWN DISPOSABLE) ×2 IMPLANT
KIT BASIN OR (CUSTOM PROCEDURE TRAY) ×1 IMPLANT
KIT POSITIONER JACKSON TABLE (MISCELLANEOUS) ×1 IMPLANT
KIT TURNOVER KIT B (KITS) ×1 IMPLANT
NDL 22X1.5 STRL (OR ONLY) (MISCELLANEOUS) ×1 IMPLANT
NDL SPNL 18GX3.5 QUINCKE PK (NEEDLE) ×1 IMPLANT
NEEDLE 22X1.5 STRL (OR ONLY) (MISCELLANEOUS) ×1 IMPLANT
NEEDLE SPNL 18GX3.5 QUINCKE PK (NEEDLE) ×1 IMPLANT
PACK LAMINECTOMY ORTHO (CUSTOM PROCEDURE TRAY) ×1 IMPLANT
PACK UNIVERSAL I (CUSTOM PROCEDURE TRAY) ×1 IMPLANT
PAD ARMBOARD POSITIONER FOAM (MISCELLANEOUS) ×2 IMPLANT
PATTIES SURGICAL .5 X.5 (GAUZE/BANDAGES/DRESSINGS) IMPLANT
PATTIES SURGICAL .5 X1 (DISPOSABLE) ×1 IMPLANT
POSITIONER HEAD PRONE TRACH (MISCELLANEOUS) ×1 IMPLANT
SOLN 0.9% NACL POUR BTL 1000ML (IV SOLUTION) ×1 IMPLANT
SOLN STERILE WATER BTL 1000 ML (IV SOLUTION) ×1 IMPLANT
SPONGE SURGIFOAM ABS GEL 100 (HEMOSTASIS) ×1 IMPLANT
SPONGE T-LAP 4X18 ~~LOC~~+RFID (SPONGE) ×2 IMPLANT
SURGIFLO W/THROMBIN 8M KIT (HEMOSTASIS) IMPLANT
SUT BONE WAX W31G (SUTURE) ×1 IMPLANT
SUT MNCRL+ AB 3-0 CT1 36 (SUTURE) ×1 IMPLANT
SUT STRATAFIX 1PDS 45CM VIOLET (SUTURE) IMPLANT
SUT VIC AB 1 CT1 18XCR BRD 8 (SUTURE) ×1 IMPLANT
SUT VIC AB 2-0 CT1 18 (SUTURE) ×1 IMPLANT
SYR BULB IRRIG 60ML STRL (SYRINGE) ×1 IMPLANT
SYR CONTROL 10ML LL (SYRINGE) ×1 IMPLANT
TOWEL GREEN STERILE (TOWEL DISPOSABLE) ×1 IMPLANT
TOWEL GREEN STERILE FF (TOWEL DISPOSABLE) ×1 IMPLANT
TRAY FOLEY MTR SLVR 16FR STAT (SET/KITS/TRAYS/PACK) ×1 IMPLANT
YANKAUER SUCT BULB TIP NO VENT (SUCTIONS) ×1 IMPLANT

## 2024-02-09 NOTE — Anesthesia Procedure Notes (Signed)
 Procedure Name: Intubation Date/Time: 02/09/2024 11:05 AM  Performed by: Virgil Ee, CRNAPre-anesthesia Checklist: Patient identified, Emergency Drugs available, Suction available and Patient being monitored Patient Re-evaluated:Patient Re-evaluated prior to induction Oxygen Delivery Method: Circle system utilized Preoxygenation: Pre-oxygenation with 100% oxygen Induction Type: IV induction Ventilation: Mask ventilation without difficulty Laryngoscope Size: Mac and 3 Grade View: Grade I Tube type: Oral Tube size: 7.0 mm Number of attempts: 1 Airway Equipment and Method: Stylet Placement Confirmation: ETT inserted through vocal cords under direct vision, positive ETCO2 and breath sounds checked- equal and bilateral Secured at: 22 cm Tube secured with: Tape Dental Injury: Teeth and Oropharynx as per pre-operative assessment

## 2024-02-09 NOTE — Progress Notes (Signed)
 Pharmacy Antibiotic Note  [name] is a 73 y.o. female admitted on 02/09/2024 with surgical prophylaxis.  Pharmacy has been consulted for Vancomycin  dosing.  Pt received Vancomycin  1000mg  IV pre-op at 0947  Plan: Vancomycin  500mg  IV once (~12h post pre-op dose) Pharmacy will sign off, please reconsult if needed  Height: 4' 10 (147.3 cm) Weight: 59.3 kg (130 lb 12.8 oz) IBW/kg (Calculated) : 40.9  Temp (24hrs), Avg:98.5 F (36.9 C), Min:98.1 F (36.7 C), Max:98.7 F (37.1 C)  No results for input(s): WBC, CREATININE, LATICACIDVEN, VANCOTROUGH, VANCOPEAK, VANCORANDOM, GENTTROUGH, GENTPEAK, GENTRANDOM, TOBRATROUGH, TOBRAPEAK, TOBRARND, AMIKACINPEAK, AMIKACINTROU, AMIKACIN in the last 168 hours.  Estimated Creatinine Clearance: 49.2 mL/min (by C-G formula based on SCr of 0.48 mg/dL).    Allergies[1]   Thank you for allowing pharmacy to be a part of this patients care.  Vito Ralph, PharmD, BCPS Please see amion for complete clinical pharmacist phone list 02/09/2024 3:35 PM     [1]  Allergies Allergen Reactions   Cephalosporins Diarrhea, Nausea And Vomiting and Other (See Comments)    Cephalexin doses > 250mg  cause abdominal pain   Other Other (See Comments)    Dissolvable Stitches after Gastric Bypass surgery caused Multiple Abscesses at the site- Pt states Bright Green in color.   Amoxicillin -Pot Clavulanate Diarrhea    Other Reaction(s): Not available   Vioxx [Rofecoxib] Swelling    EXTREME LEG SWELLING    Banana Extract Allergy Skin Test Dermatitis and Itching   Bactrim  [Sulfamethoxazole -Trimethoprim ] Diarrhea and Nausea And Vomiting   Banana Itching and Rash   Ciprofloxacin  Diarrhea and Nausea And Vomiting   Coconut Flavoring Agent (Non-Screening) Itching and Rash   Codeine Nausea Only   Zithromax [Azithromycin] Diarrhea and Nausea And Vomiting

## 2024-02-09 NOTE — Op Note (Signed)
 OPERATIVE REPORT  DATE OF SURGERY: 02/09/2024  PATIENT NAME:  [name] MRN: [id number] DOB: 1950/08/16  PCP: Rosalynn Camie CROME, MD  PRE-OPERATIVE DIAGNOSIS: Lumbar spinal stenosis L3-5 with L4-5 synovial cyst.  POST-OPERATIVE DIAGNOSIS: Same  PROCEDURE:   1.  L3-4, L4-5 decompression with L4 laminectomy and partial laminotomy of L3 2.  Right lateral recess decompression L3-4.  Right Gill decompression L4-5 with excision of right L4-5 synovial cyst. 3.  Left L4 laminotomy with lateral recess decompression L4-5. 4.  In situ fusion L4-5.  SURGEON:  Donaciano Sprang, MD  PHYSICIAN ASSISTANT: Jeoffrey Sages  ANESTHESIA:   General  EBL: See anesthesia report   Complications: None  Graft: Autograft from decompression mixed with ossifuse  BRIEF HISTORY: [name] is a 73 y.o. female who Presents with significant radicular right leg pain.  Imaging demonstrated right lateral recess stenosis at L3-4, as well as a large synovial cyst L4-5 causing central and right lateral recess compression.  Patient had progressive right radicular leg pain that did not improve with conservative management.  As a result we elected to move forward with surgery.  Risks, benefits, alternatives were discussed with the patient and consent was obtained.  PROCEDURE DETAILS: Patient was brought into the operating room and was properly positioned on the operating room table.  After induction with general anesthesia the patient was endotracheally intubated.  A timeout was taken to confirm all important data: including patient, procedure, and the level. Teds, SCD's were applied.   Patient was turned prone onto the Wilson frame and all bony prominences well-padded.  2 needles were placed in the back and an x-ray was taken for localization of our incision.  I marked out my incision site to span the L3-4 and L4-5 levels.  The incision was infiltrated with quarter percent Marcaine  with epinephrine  and a midline incision was  made.  Sharp dissection was carried out down to the deep fascia.  The deep fascia was incised and I stripped the paraspinal muscles to expose the L3, L4, and L5 spinous processes.  I then exposed the L3 and L4 lamina as well as the L4-5 and L3-4 facets.  Field 4 was then placed underneath the L4 lamina and an x-ray was taken confirming dose at the appropriate level.  Using a double Leksell rongeurs I removed the entire L4 spinous process.  I then resected the entire inferior right L4 facet.  There was significant degenerative changes within the facet joint.  I then removed the thickened ligamentum flavum and was able to defy the central raphae.  Given the size of the right sided synovial cyst I elected to start my decompression towards the left.  I developed a plane between the thecal sac and the ligamentum flavum and resected the ligamentum flavum.  I continued my decompression into the lateral recess performing a medial facetectomy and laminotomy of L4 on the left side.  I now had excellent mobilization of the thecal sac.  I then went on the contralateral side and I was able to retract the thecal sac to the left and expose the large L4-5 synovial cyst.  Using a Penfield 4 I gently began dissecting the cyst from off of the thecal sac.  Great care was taken not to place excessive traction on the facet cyst and risk a durotomy.  Ultimately I was able to remove the bulk of the cyst which was sent for pathology.  I now could see the L5 nerve root and this allowed me  to perform a more generous medial facetectomy.  I then used my nerve hooks to begin sweeping and removing excess cyst wall.  I used a Dance movement psychotherapist as well.  There was still some of the cyst that was quite adherent to the thecal sac but I elected to leave this.  I had an adequate Gill decompression on this right side and the thecal sac had expanded into the space that I created.  I turned my attention now to the L3-4 level.  A right L3 laminotomy was  performed with Kerrison rongeurs and then I resected the ligamentum flavum.  I then went into the lateral recess with my Penfield 4 developed the plane and used 2 mm Kerrison rongeur to perform a medial facetectomy.  I then placed a neuro patty underneath the remaining portion of the right L4 lamina.  I then completed my right L4 laminectomy which allowed me to remove some additional cyst wall.  I then carried my decompression into the lateral recess and made sure I performed an foraminotomy of L4 and of L5.  At this point I had an excellent decompression of the right lateral recess from L3-L5.  The cyst had been excised and I was quite pleased with the overall decompression.  I exposed the L5 and L4 transverse processes and removed the facet capsule at L4-5 bilaterally.  Using a high-speed bur I decorticated the transverse process and a portion of the facet.  I then packed the posterior lateral gutter with bone graft mixed with allograft.  Final irrigation was done and hemostasis is obtained using bipolar electrocautery.  Once the decompression proved satisfactory and I hemostasis I removed my retractors and placed a thrombin -soaked Gelfoam patty over the laminectomy site.  Vancomycin  powder was then placed into the wound and I closed the deep fascia with interrupted #1 Vicryl sutures.  Additional vancomycin  was powder was placed in the superficial fascia and then this was closed with interrupted 2-0 Vicryl sutures.  The skin was then reapproximated with 3-0 Monocryl.  Steri-Strips dry dressings were then applied the patient was extubated transferred to PACU without incident.  The end of the case all needle sponge counts were correct.  Donaciano Sprang, MD 02/09/2024 1:53 PM

## 2024-02-09 NOTE — Brief Op Note (Signed)
 10/23/2023   10:23 AM   PATIENT:  02/09/2024  2:03 PM  PATIENT:  [name]  73 y.o. female  PRE-OPERATIVE DIAGNOSIS:  lumbar radiculopathy, spondylosis of lumbar  POST-OPERATIVE DIAGNOSIS:  lumbar radiculopathy, spondylosis of lumbar  PROCEDURE:  Procedures with comments: POSTERIOR LUMBAR FUSION 2 LEVEL (N/A) - RT L3-5 hemilaminectomy and facetectomy with synovial cyst    L4-5  excision and in situ fusion LAMINECTOMY, SPINE, LUMBAR, WITH EXTRADURAL LESION EXCISION (N/A)  SURGEON:  Surgeons and Role:    DEWAINE Burnetta Aures, MD - Primary  PHYSICIAN ASSISTANT: Jeoffrey Sages, PA  ANESTHESIA:   general  EBL:  50 mL   BLOOD ADMINISTERED:none  DRAINS: none   LOCAL MEDICATIONS USED:  MARCAINE      SPECIMEN:  Source of Specimen:  L4/5 facet cyst  DISPOSITION OF SPECIMEN:  PATHOLOGY  COUNTS:  YES  TOURNIQUET:  * No tourniquets in log *  DICTATION: .Dragon Dictation  PLAN OF CARE: Admit to inpatient   PATIENT DISPOSITION:  PACU - hemodynamically stable.

## 2024-02-09 NOTE — H&P (Signed)
 "    History: The patient is a 73 year old female who presents for pre-operative visit in preparation for their Right L3-L5 hemilaminectomy abnd facetectomy with synovial cyst excision and in situ fusion , which is scheduled on 12/22 at [facility]. The patient has had symptoms in the Back including pain, radiculoapthy, and decreased ROM which has impacted their quality of life and ability to do activities of daily living. The patient currently has a diagnosis of lumbar stenosis and has failed conservative treatments. The patient has had no previous surgery . The patient denies an active infection  Past Medical History:  Diagnosis Date   Anemia    take iron   Anxiety    Arthritis    Depression    Dysrhythmia 2016   occ due to caffeine intake   Encounter for long-term (current) use of other medications    GERD (gastroesophageal reflux disease)    Hepatitis    age 74   History of hiatal hernia    Hypertension    IBS (irritable bowel syndrome)    Obesity    Osteoarthritis    Pain in joint, multiple sites    Pneumonia 2010   PONV (postoperative nausea and vomiting)    in the 1980's no problems since   Psoriatic arthropathy (HCC)    Pulmonary embolism (HCC) 12/03/2016   submassive/notes 12/03/2016   Raynaud disease     Allergies[1]  Medications Ordered Prior to Encounter[2]  Physical Exam: Vitals:   02/09/24 0903  BP: (!) 140/65  Pulse: 82  Resp: 18  Temp: 98.1 F (36.7 C)  SpO2: 98%   Body mass index is 27.34 kg/m. General: AAOX3, well developed and well nourished, NAD Ambulation abnormal uses walker assitive devices Heart: RRR. Lungs: Normal pulmonary effort, no signs of respiratory distress Abdomen: Normal bowel soundsX4, soft, non-tender, no hepatosplenomegaly.  Skin: No abnormal lesions, abrasions or contusions. MSK: No obvious bony abnormalities, tenderness to palpation AROM Lumbar Spine: -Spine: normal ROM, pain elicited with fwd flexion and  extension  - Knee: flexion and extension normal and pain free bilaterally.  - Ankle: Dorsiflexion, plantarflexion, inversion, eversion normal and pain free.   Dermatomes:Lower extremity sensation to light touch abnormal with positive dysathesias on the in the L4 and L5 dermatome distribution  Myotomes:   - L2: Left 5/5, Right 5/5  -L3: Left 5/5, Right 5/5  - L4: Left 5/5, Right 5/5  - L5: Left 5/5, Right 5/5  - S1: Left 5/5, Right 5/5  Reflexes:   - Patella: Left2+, Right 2+   Achilles: Left2+, Right 2+  - Babinski: Left Negative, Right Negative  - Clonus: Negative  Special Tests:  - Straight Leg Raise: Left Negative, Right Positive - FABER Test:Negative  PV: Extremities warm and well perfused. Posterior and dorsalis pedis pulse 2+ bilaterally, No pitting Edema, discoloration, calf tenderness, or palpable cords. Homan's negative bilaterally.   3vLumbar: Evidence of degenerative scoliosis. Patient does have a visible hernia mesh. She does have degenerative disc disease at L3-L4. Bilateral SI joints visualized and show no signs of joint space narrowing bilaterally. Bilateral Hip joints visualized and show no signs of joint space narrowing or other bony abnormalities bilaterally. No evidence of spondylolisthesis. No signs of fractures, bony lesions, or other bony abnormalities.  MRI of the lumbar spine performed on November 18, 2023 demonstrates that the patient has transitional anatomy with partial sacralization of L5. No cord signal changes. At L3-L4 there is a small central right paracentral inferior disc  extrusion that is indenting the ventral thecal sac. There is a small to moderate left foraminal, far lateral disc protrusion encroaching upon the left L3 nerve root. There is moderate to severe central and right slightly greater than left subarticular zone stenosis. Moderate left and mild to moderate right foraminal stenosis. At L4-L5 there is a inferiorly directed 12 to 13  mm right sided intraspinal synovial cyst that encroaches upon the traversing right L5 nerve root. There is predominantly right sided central stenosis with severe right and mild to moderate left subarticular zone stenosis. There is only mild to moderate biforaminal stenosis at this level.   A/P: Patient has failed conservative management, therefore we have elected to proceed with the following surgical procedure Right L3-L5 hemilaminectomy abnd facetectomy with synovial cyst excision and in situ fusion on 02/09/2024 at [facility] .  The patient has not had any improvement of their symptoms with conservative treatment measures. she underwent an MRI and was referred to our clinic after PT was not improving her symptoms after a little over a month of PT.  On exam; she has radicular right leg pain with a positive straight leg raise on the right. Otherwise she was neurologically intact.   Lumbar xrays: Evidence of degenerative scoliosis. Patient does have a visible hernia mesh. She does have degenerative disc disease at L3-L4. Bilateral SI joints visualized and show no signs of joint space narrowing bilaterally. Bilateral Hip joints visualized and show no signs of joint space narrowing or other bony abnormalities bilaterally. No evidence of spondylolisthesis. No signs of fractures, bony lesions, or other bony abnormalities.  MRI of the lumbar spine performed on November 18, 2023 demonstrates that the patient has transitional anatomy with partial sacralization of L5. No cord signal changes. At L3-L4 there is a small central right paracentral inferior disc extrusion that is indenting the ventral thecal sac. There is a small to moderate left foraminal, far lateral disc protrusion encroaching upon the left L3 nerve root. There is moderate to severe central and right slightly greater than left subarticular zone stenosis. Moderate left and mild to moderate right foraminal stenosis. At L4-L5 there is a inferiorly  directed 12 to 13 mm right sided synovial cyst that encroaches upon the traversing right L5 nerve root. There is predominantly right sided central stenosis with severe right and mild to moderate left subarticular zone stenosis. There is only mild to moderate biforaminal stenosis at this level.  Based on clinical exam findings and imaging studies we do believe that surgery is warranted at this time in order to decrease pain and loss of quality of life.  The surgical intervention that we would recommend would be Right L3-L5 hemilaminectomy abnd facetectomy with synovial cyst excision and in situ fusion   The risks of surgery were discussed today with the patient, below are the surgical risks we did discuss. Risks and benefits of surgery were discussed with the patient. These include: Infection, bleeding, death, stroke, paralysis, ongoing or worse pain, need for additional surgery, leak of spinal fluid, adjacent segment degeneration requiring additional surgery, post-operative hematoma formation that can result in neurological compromise and the need for urgent/emergent re-operation. Loss in bowel and bladder control. Injury to major vessels that could result in the need for urgent abdominal surgery to stop bleeding. Risk of deep venous thrombosis (DVT) and the need for additional treatment. Recurrent disc herniation resulting in the need for revision surgery, which could include fusion surgery (utilizing instrumentation such as pedicle screws and intervertebral cages).  Additional risk: If instrumentation is used to address spinal stenosis there is a risk of migration, or breakage of that hardware that could require additional surgery.  Goal of surgery: Reduce (not eliminate) pain, and improve quality of life.   Diagnosis: right L4-L5 synovial cyst; Right L3-L4 lateral recess stenosis   Post-operative care plans were discussed with the patient today and all patient questions were answered. Post-op PT  will start 6 weeks after surgery, patient plans to be d/c to home. Patient has LSO brace for post-op care. Patient is currently on warfarin and will hold for 5 days prior to surgery and will restart 12-24 hours after surgery per PCP instructions      [1]  Allergies Allergen Reactions   Cephalosporins Diarrhea, Nausea And Vomiting and Other (See Comments)    Cephalexin doses > 250mg  cause abdominal pain   Other Other (See Comments)    Dissolvable Stitches after Gastric Bypass surgery caused Multiple Abscesses at the site- Pt states Bright Green in color.   Amoxicillin -Pot Clavulanate Diarrhea    Other Reaction(s): Not available   Vioxx [Rofecoxib] Swelling    EXTREME LEG SWELLING    Banana Extract Allergy Skin Test Dermatitis and Itching   Bactrim  [Sulfamethoxazole -Trimethoprim ] Diarrhea and Nausea And Vomiting   Banana Itching and Rash   Ciprofloxacin  Diarrhea and Nausea And Vomiting   Coconut Flavoring Agent (Non-Screening) Itching and Rash   Codeine Nausea Only   Zithromax [Azithromycin] Diarrhea and Nausea And Vomiting  [2]  No current facility-administered medications on file prior to encounter.   Current Outpatient Medications on File Prior to Encounter  Medication Sig Dispense Refill   acetaminophen  (TYLENOL ) 500 MG tablet Take 500-1,000 mg by mouth every 6 (six) hours as needed (pain).     ALPRAZolam  (XANAX ) 0.5 MG tablet TAKE 1 TABLET (0.5 MG TOTAL) BY MOUTH 3 (THREE) TIMES DAILY AS NEEDED FOR ANXIETY. (Patient taking differently: Take 0.5 mg by mouth See admin instructions. Take 2 tablets (1 mg) by mouth scheduled at bedtime, may take 1 tablet (0.5 mg) by mouth during the day if needed for anxiety.) 90 tablet 2   benazepril  (LOTENSIN ) 20 MG tablet Take 1 tablet (20 mg total) by mouth daily. 90 tablet 3   Cholecalciferol  (VITAMIN D -3) 125 MCG (5000 UT) TABS Take 5,000 Units by mouth daily.     cyanocobalamin  (VITAMIN B12) 1000 MCG tablet Take 1,000 mcg by mouth daily.      diclofenac  (VOLTAREN ) 75 MG EC tablet TAKE ONE OR TWO TABS BY MOUTH DAILY AS NEEDED FOR PAIN (Patient taking differently: Take 75 mg by mouth in the morning.) 180 tablet 3   esomeprazole  (NEXIUM ) 40 MG capsule Take 40 mg by mouth every morning.     famotidine (PEPCID) 40 MG tablet Take 40 mg by mouth daily at 8 pm.     fluconazole  (DIFLUCAN ) 100 MG tablet TAKE 1 TABLET (100 MG TOTAL) BY MOUTH EVERY MONDAY. 12 tablet 3   furosemide  (LASIX ) 20 MG tablet TAKE 2 TABLETS BY MOUTH TWICE A DAY 360 tablet 1   gabapentin  (NEURONTIN ) 100 MG capsule TAKE 2 CAPSULES BY MOUTH 3 TIMES A DAY 540 capsule 3   QUEtiapine  (SEROQUEL ) 25 MG tablet TAKE 1 TABLET BY MOUTH EVERYDAY AT BEDTIME 90 tablet 1   venlafaxine  XR (EFFEXOR -XR) 150 MG 24 hr capsule TAKE 1 CAPSULE BY MOUTH EVERY DAY (Patient taking differently: Take 150 mg by mouth at bedtime.) 90 capsule 3   venlafaxine  XR (EFFEXOR -XR) 75 MG 24 hr capsule  TAKE 1 CAPSULE BY MOUTH EVERY DAY 90 capsule 3   warfarin (COUMADIN ) 2 MG tablet TAKE 4 TABLETS A DAY AS DIRECTED BY MOUTH (Patient taking differently: Take 5 mg by mouth daily with lunch.) 360 tablet 3   "

## 2024-02-09 NOTE — Plan of Care (Signed)

## 2024-02-09 NOTE — Transfer of Care (Signed)
 Immediate Anesthesia Transfer of Care Note  Patient: [name]  Procedure(s) Performed: POSTERIOR LUMBAR FUSION 2 LEVEL LAMINECTOMY, SPINE, LUMBAR, WITH EXTRADURAL LESION EXCISION  Patient Location: PACU  Anesthesia Type:General  Level of Consciousness: drowsy and responds to stimulation  Airway & Oxygen Therapy: Patient Spontanous Breathing and Patient connected to nasal cannula oxygen  Post-op Assessment: Report given to RN and Post -op Vital signs reviewed and stable  Post vital signs: Reviewed and stable  Last Vitals:  Vitals Value Taken Time  BP 122/61 02/09/24 14:30  Temp 37.1 C 02/09/24 14:22  Pulse 87 02/09/24 14:32  Resp 14 02/09/24 14:32  SpO2 95 % 02/09/24 14:32  Vitals shown include unfiled device data.  Last Pain:  Vitals:   02/09/24 0955  TempSrc:   PainSc: 5       Patients Stated Pain Goal: 2 (02/09/24 0955)  Complications: No notable events documented.

## 2024-02-10 MED ORDER — DIPHENHYDRAMINE HCL 25 MG PO CAPS
25.0000 mg | ORAL_CAPSULE | Freq: Four times a day (QID) | ORAL | Status: DC | PRN
  Administered 2024-02-10 (×2): 25 mg via ORAL
  Filled 2024-02-10 (×2): qty 1

## 2024-02-10 MED ORDER — VENLAFAXINE HCL ER 75 MG PO CP24
75.0000 mg | ORAL_CAPSULE | Freq: Every day | ORAL | Status: DC
  Administered 2024-02-11 – 2024-02-12 (×2): 75 mg via ORAL
  Filled 2024-02-10 (×2): qty 1

## 2024-02-10 MED ORDER — ENOXAPARIN SODIUM 40 MG/0.4ML IJ SOSY: 40.0000 mg | PREFILLED_SYRINGE | Freq: Every day | INTRAMUSCULAR | Status: DC

## 2024-02-10 MED ORDER — ENOXAPARIN SODIUM 40 MG/0.4ML IJ SOSY
40.0000 mg | PREFILLED_SYRINGE | Freq: Every day | INTRAMUSCULAR | Status: DC
  Administered 2024-02-10 – 2024-02-12 (×3): 40 mg via SUBCUTANEOUS
  Filled 2024-02-10 (×3): qty 0.4

## 2024-02-10 MED ORDER — CALCIUM CARBONATE ANTACID 500 MG PO CHEW: 1.0000 | CHEWABLE_TABLET | Freq: Three times a day (TID) | ORAL | Status: DC | PRN

## 2024-02-10 MED ORDER — ENOXAPARIN SODIUM 30 MG/0.3ML IJ SOSY
30.0000 mg | PREFILLED_SYRINGE | Freq: Every day | INTRAMUSCULAR | Status: DC
  Filled 2024-02-10: qty 0.3

## 2024-02-10 NOTE — TOC Transition Note (Signed)
 Transition of Care [facility]) - Discharge Note   Patient Details  Name: [name] MRN: [id number] Date of Birth: [birth year]  [facility] CM/SW Contact:  Andrez JULIANNA George, RN Phone Number: 02/10/2024, 3:21 PM   Clinical Narrative:     Home health arranged with [facility]. Information on the AVS. Pt has transportation home.  Final next level of care: Home w Home Health Services Barriers to Discharge: No Barriers Identified   Patient Goals and CMS Choice   CMS Medicare.gov Compare Post Acute Care list provided to:: Patient Choice offered to / list presented to : Patient      Discharge Placement                       Discharge Plan and Services Additional resources added to the After Visit Summary for                            [facility] Arranged: PT, OT [facility] Agency: [facility] Date [facility] Agency Contacted: 02/10/24   Representative spoke with at [facility] Agency: Amy  Social Drivers of Health (SDOH) Interventions SDOH Screenings   Food Insecurity: Food Insecurity Present (12/05/2023)  Housing: Low Risk (12/05/2023)  Transportation Needs: No Transportation Needs (12/05/2023)  Utilities: Not At Risk (07/24/2023)  Alcohol Screen: Low Risk (07/24/2023)  Depression (PHQ2-9): Low Risk (12/10/2023)  Financial Resource Strain: Low Risk (12/05/2023)  Physical Activity: Inactive (12/05/2023)  Social Connections: Moderately Isolated (12/05/2023)  Stress: No Stress Concern Present (12/05/2023)  Tobacco Use: Low Risk (02/09/2024)  Health Literacy: Adequate Health Literacy (07/24/2023)     Readmission Risk Interventions     No data to display

## 2024-02-10 NOTE — Evaluation (Signed)
 Physical Therapy Evaluation  Patient Details Name: [name] MRN: [id number] DOB: 09/29/1950 Today's Date: 02/10/2024  History of Present Illness  Pt is a 73 y/o F who presents s/p L3-4, L4-5 decompression with L 4 laminectomy and partial laminectomy of L3 and fusion of L4-5 on 02/09/24. PMH inclues anemia, anxiety, HTN, IBS, osteoarthritis, PNA, Raynaud disease  Clinical Impression  Pt admitted with above diagnosis. At the time of PT eval, pt was able to demonstrate transfers and ambulation with gross CGA to supervision for safety and RW for support. Pt with extreme forward head posture and rounded shoulders 2 kyphosis, able to improve posture briefly with cues. Pt was educated on precautions, brace application/wearing schedule, appropriate activity progression, and car transfer. Pt currently with functional limitations due to the deficits listed below (see PT Problem List). Pt will benefit from skilled PT to increase their independence and safety with mobility to allow discharge to the venue listed below.          If plan is discharge home, recommend the following: A little help with walking and/or transfers;A little help with bathing/dressing/bathroom;Assistance with cooking/housework;Assist for transportation;Help with stairs or ramp for entrance   Can travel by private vehicle        Equipment Recommendations None recommended by PT  Recommendations for Other Services       Functional Status Assessment Patient has had a recent decline in their functional status and demonstrates the ability to make significant improvements in function in a reasonable and predictable amount of time.     Precautions / Restrictions Precautions Precautions: Fall;Back Precaution Booklet Issued: Yes (comment) Recall of Precautions/Restrictions: Intact Precaution/Restrictions Comments: Reviewed handout and pt was cued for precautions during functional mobility. Pt was able to recall 3/3 back  precautions. Required Braces or Orthoses: Spinal Brace Spinal Brace: Lumbar corset;Applied in sitting position Restrictions Weight Bearing Restrictions Per Provider Order: No      Mobility  Bed Mobility Overal bed mobility: Needs Assistance Bed Mobility: Rolling, Sidelying to Sit Rolling: Contact guard assist Sidelying to sit: Contact guard assist       General bed mobility comments: Hands on guarding to guide pt through log roll technique. HOB flat and rails lowered to simulate home environment.    Transfers Overall transfer level: Needs assistance Equipment used: None Transfers: Sit to/from Stand Sit to Stand: Supervision           General transfer comment: VC's for hand placement on seated surface for safety.    Ambulation/Gait Ambulation/Gait assistance: Contact guard assist Gait Distance (Feet): 150 Feet Assistive device: Rolling walker (2 wheels) Gait Pattern/deviations: Step-through pattern, Decreased stride length, Trunk flexed Gait velocity: Decreased Gait velocity interpretation: 1.31 - 2.62 ft/sec, indicative of limited community ambulator   General Gait Details: VC's for improved posture, closer walker proximity, and forward gaze. No assist required but hands on guarding provided throughout. Pt with extreme forward head posture with rounded shoulders due to kyphosis.  Stairs Stairs: Yes Stairs assistance: Contact guard assist Stair Management: Two rails, Step to pattern, Forwards Number of Stairs: 5 General stair comments: VC's for sequencing and general safety.  Wheelchair Mobility     Tilt Bed    Modified Rankin (Stroke Patients Only)       Balance Overall balance assessment: Needs assistance Sitting-balance support: Feet supported Sitting balance-Leahy Scale: Good     Standing balance support: During functional activity Standing balance-Leahy Scale: Fair Standing balance comment: intermittent furniture reaching  Pertinent Vitals/Pain Pain Assessment Pain Assessment: Faces Faces Pain Scale: Hurts little more Pain Location: back Pain Descriptors / Indicators: Discomfort Pain Intervention(s): Limited activity within patient's tolerance, Monitored during session, Repositioned    Home Living Family/patient expects to be discharged to:: Private residence Living Arrangements: Spouse/significant other Available Help at Discharge: Family;Available 24 hours/day Type of Home: House Home Access: Stairs to enter Entrance Stairs-Rails: Can reach both Entrance Stairs-Number of Steps: 4   Home Layout: One level Home Equipment: Agricultural Consultant (2 wheels);Rollator (4 wheels);Shower seat;BSC/3in1;Adaptive equipment      Prior Function Prior Level of Function : Independent/Modified Independent             Mobility Comments: no AD ADLs Comments: ind     Extremity/Trunk Assessment   Upper Extremity Assessment Upper Extremity Assessment: Generalized weakness    Lower Extremity Assessment Lower Extremity Assessment: Generalized weakness    Cervical / Trunk Assessment Cervical / Trunk Assessment: Back Surgery;Kyphotic  Communication   Communication Communication: No apparent difficulties    Cognition Arousal: Alert Behavior During Therapy: WFL for tasks assessed/performed                             Following commands: Intact       Cueing Cueing Techniques: Verbal cues     General Comments      Exercises     Assessment/Plan    PT Assessment Patient needs continued PT services  PT Problem List Decreased strength;Decreased activity tolerance;Decreased balance;Decreased mobility;Decreased knowledge of use of DME;Decreased safety awareness;Decreased knowledge of precautions;Pain       PT Treatment Interventions DME instruction;Gait training;Stair training;Therapeutic activities;Functional mobility training;Therapeutic exercise;Balance  training;Patient/family education    PT Goals (Current goals can be found in the Care Plan section)  Acute Rehab PT Goals Patient Stated Goal: Home at d/c PT Goal Formulation: With patient/family Time For Goal Achievement: 02/17/24 Potential to Achieve Goals: Good    Frequency Min 5X/week     Co-evaluation               AM-PAC PT 6 Clicks Mobility  Outcome Measure Help needed turning from your back to your side while in a flat bed without using bedrails?: A Little Help needed moving from lying on your back to sitting on the side of a flat bed without using bedrails?: A Little Help needed moving to and from a bed to a chair (including a wheelchair)?: A Little Help needed standing up from a chair using your arms (e.g., wheelchair or bedside chair)?: A Little Help needed to walk in hospital room?: A Little Help needed climbing 3-5 steps with a railing? : A Little 6 Click Score: 18    End of Session Equipment Utilized During Treatment: Gait belt;Back brace Activity Tolerance: Patient tolerated treatment well Patient left: in chair;with call bell/phone within reach;with family/visitor present Nurse Communication: Mobility status PT Visit Diagnosis: Unsteadiness on feet (R26.81);Pain Pain - part of body:  (back)    Time: 8897-8872 PT Time Calculation (min) (ACUTE ONLY): 25 min   Charges:   PT Evaluation $PT Eval Low Complexity: 1 Low PT Treatments $Gait Training: 8-22 mins PT General Charges $$ ACUTE PT VISIT: 1 Visit         Leita Sable, PT, DPT Acute Rehabilitation Services Secure Chat Preferred Office: [phone]   Leita JONETTA Sable 02/10/2024, 1:17 PM

## 2024-02-10 NOTE — Evaluation (Signed)
 Occupational Therapy Evaluation Patient Details Name: [name] MRN: [id number] DOB: May 25, 1950 Today's Date: 02/10/2024   History of Present Illness   73 y/o F s/p L3-4, L4-5 decompression with L 4 laminectomy and partial laminectomy of L3 on 12/22. PMH inclues anemia, anxiety, GERD, HTN, IBS, osteoarthritis, PNA, Raynaud disease     Clinical Impressions Pt reports ind at baseline with ADLs and functional mobility, lives with spouse who can assist at d/c. Pt currently needs min-mod A for ADLs, and supervision for transfers without AD. Pt intermittently reaching for external support. Pt educated on back precautions (handout provided), brace wear, and compensatory strategies for ADLs. Pt presenting with impairments listed below, will follow acutely. Recommend HHOT at d/c.      If plan is discharge home, recommend the following:   A little help with walking and/or transfers;A lot of help with bathing/dressing/bathroom;Assistance with cooking/housework;Assist for transportation;Help with stairs or ramp for entrance     Functional Status Assessment   Patient has had a recent decline in their functional status and demonstrates the ability to make significant improvements in function in a reasonable and predictable amount of time.     Equipment Recommendations   None recommended by OT     Recommendations for Other Services   PT consult     Precautions/Restrictions   Precautions Precautions: Fall;Back Precaution Booklet Issued: Yes (comment) Recall of Precautions/Restrictions: Intact Precaution/Restrictions Comments: educated on 3/3 back prec Required Braces or Orthoses: Spinal Brace Spinal Brace: Lumbar corset;Applied in sitting position Restrictions Weight Bearing Restrictions Per Provider Order: No     Mobility Bed Mobility               General bed mobility comments: in bathroom upon arrival and in chair at departure, able to verbalize log roll     Transfers Overall transfer level: Needs assistance Equipment used: None Transfers: Sit to/from Stand Sit to Stand: Supervision                  Balance Overall balance assessment: Needs assistance Sitting-balance support: Feet supported Sitting balance-Leahy Scale: Good     Standing balance support: During functional activity Standing balance-Leahy Scale: Fair Standing balance comment: intermittent furniture reaching                           ADL either performed or assessed with clinical judgement   ADL Overall ADL's : Needs assistance/impaired Eating/Feeding: Supervision/ safety   Grooming: Supervision/safety   Upper Body Bathing: Minimal assistance   Lower Body Bathing: Moderate assistance   Upper Body Dressing : Minimal assistance   Lower Body Dressing: Moderate assistance   Toilet Transfer: Supervision/safety;Ambulation   Toileting- Clothing Manipulation and Hygiene: Supervision/safety       Functional mobility during ADLs: Supervision/safety       Vision Baseline Vision/History: 1 Wears glasses Vision Assessment?: No apparent visual deficits     Perception Perception: Not tested       Praxis Praxis: Not tested       Pertinent Vitals/Pain Pain Assessment Pain Assessment: Faces Pain Score: 3  Faces Pain Scale: Hurts little more Pain Location: back Pain Descriptors / Indicators: Discomfort Pain Intervention(s): Limited activity within patient's tolerance, Monitored during session, Repositioned     Extremity/Trunk Assessment Upper Extremity Assessment Upper Extremity Assessment: Generalized weakness   Lower Extremity Assessment Lower Extremity Assessment: Defer to PT evaluation   Cervical / Trunk Assessment Cervical / Trunk Assessment: Back Surgery   Communication  Communication Communication: No apparent difficulties   Cognition Arousal: Alert Behavior During Therapy: WFL for tasks assessed/performed Cognition: No  apparent impairments                               Following commands: Intact       Cueing  General Comments   Cueing Techniques: Verbal cues  VSS   Exercises     Shoulder Instructions      Home Living Family/patient expects to be discharged to:: Private residence Living Arrangements: Spouse/significant other Available Help at Discharge: Family;Available 24 hours/day Type of Home: House Home Access: Stairs to enter [employer] of Steps: 4 Entrance Stairs-Rails: Can reach both Home Layout: One level     Bathroom Shower/Tub: Chief Strategy Officer: Standard     Home Equipment: Agricultural Consultant (2 wheels);Rollator (4 wheels);Shower seat;BSC/3in1;Adaptive equipment Adaptive Equipment: Reacher        Prior Functioning/Environment Prior Level of Function : Independent/Modified Independent             Mobility Comments: no AD ADLs Comments: ind    OT Problem List: Decreased strength;Decreased range of motion;Decreased activity tolerance;Impaired balance (sitting and/or standing);Decreased knowledge of precautions   OT Treatment/Interventions: Self-care/ADL training;Therapeutic exercise;Energy conservation;DME and/or AE instruction;Therapeutic activities;Patient/family education;Balance training      OT Goals(Current goals can be found in the care plan section)   Acute Rehab OT Goals Patient Stated Goal: none stated OT Goal Formulation: With patient Time For Goal Achievement: 02/24/24 Potential to Achieve Goals: Good ADL Goals Pt Will Perform Upper Body Dressing: with modified independence;sitting Pt Will Perform Lower Body Dressing: with modified independence;sitting/lateral leans;sit to/from stand Pt Will Transfer to Toilet: with modified independence;ambulating;regular height toilet Pt Will Perform Tub/Shower Transfer: Tub transfer;Shower transfer;shower seat;ambulating;with modified independence   OT Frequency:  Min  2X/week    Co-evaluation              AM-PAC OT 6 Clicks Daily Activity     Outcome Measure Help from another person eating meals?: None Help from another person taking care of personal grooming?: A Little Help from another person toileting, which includes using toliet, bedpan, or urinal?: A Little Help from another person bathing (including washing, rinsing, drying)?: A Lot Help from another person to put on and taking off regular upper body clothing?: A Little Help from another person to put on and taking off regular lower body clothing?: A Lot 6 Click Score: 17   End of Session Equipment Utilized During Treatment: Back brace Nurse Communication: Mobility status  Activity Tolerance: Patient tolerated treatment well Patient left: in chair;with call bell/phone within reach  OT Visit Diagnosis: Unsteadiness on feet (R26.81);Other abnormalities of gait and mobility (R26.89);Muscle weakness (generalized) (M62.81)                Time: 9247-9177 OT Time Calculation (min): 30 min Charges:  OT General Charges $OT Visit: 1 Visit OT Evaluation $OT Eval Moderate Complexity: 1 Mod OT Treatments $Self Care/Home Management : 8-22 mins  Laine Fonner K, OTD, OTR/L SecureChat Preferred Acute Rehab [phone]   Laneta MARLA Pereyra 02/10/2024, 8:33 AM

## 2024-02-10 NOTE — Progress Notes (Signed)
" ° ° °  Subjective: Procedures (LRB): POSTERIOR LUMBAR FUSION 2 LEVEL (N/A) LAMINECTOMY, SPINE, LUMBAR, WITH EXTRADURAL LESION EXCISION (N/A) 1 Day Post-Op  Patient reports pain as 2 on 0-10 scale.  Reports none leg pain reports incisional back pain   Positive void Negative bowel movement Positive flatus Negative chest pain or shortness of breath  Objective: Vital signs in last 24 hours: Temp:  [98.1 F (36.7 C)-98.7 F (37.1 C)] 98.3 F (36.8 C) (12/23 0314) Pulse Rate:  [73-97] 89 (12/23 0314) Resp:  [11-18] 16 (12/23 0314) BP: (92-140)/(54-97) 97/55 (12/23 0314) SpO2:  [92 %-100 %] 100 % (12/23 0314) Weight:  [59.3 kg] 59.3 kg (12/22 0903)  Intake/Output from previous day: 12/22 0701 - 12/23 0700 In: 1830 [P.O.:480; I.V.:1000; IV Piggyback:350] Out: 50 [Blood:50]  Labs: No results for input(s): WBC, RBC, HCT, PLT in the last 72 hours. No results for input(s): NA, K, CL, CO2, BUN, CREATININE, GLUCOSE, CALCIUM  in the last 72 hours. Recent Labs    02/09/24 0947  INR 1.0    Physical Exam: Neurologically intact ABD soft Intact pulses distally Dorsiflexion/Plantar flexion intact Incision: dressing C/D/I Compartment soft Body mass index is 27.34 kg/m.   Assessment/Plan: Patient stable -reports complete resolution of radicular right leg pain. xrays n/a 1.  Patient will restart Lovenox  this afternoon. 2.  Will plan on observation today to ensure there is no bleeding after she restarts her Lovenox . 3.  Patient will work with physical therapy and Occupational Therapy. 4.  Will restart her Coumadin  tomorrow morning and plan on discharge that afternoon.  Patient will have follow-up with me in 2 weeks.  Prescription for pain medication, muscle relaxer, and nausea medicine will be provided.  Donaciano Sprang, MD [facility] [phone]  "

## 2024-02-10 NOTE — Plan of Care (Signed)

## 2024-02-10 NOTE — Anesthesia Postprocedure Evaluation (Signed)
"   Anesthesia Post Note  Patient: [name]  Procedure(s) Performed: POSTERIOR LUMBAR FUSION 2 LEVEL LAMINECTOMY, SPINE, LUMBAR, WITH EXTRADURAL LESION EXCISION     Patient location during evaluation: PACU Anesthesia Type: General Level of consciousness: awake and alert Pain management: pain level controlled Vital Signs Assessment: post-procedure vital signs reviewed and stable Respiratory status: spontaneous breathing, nonlabored ventilation, respiratory function stable and patient connected to nasal cannula oxygen Cardiovascular status: blood pressure returned to baseline and stable Postop Assessment: no apparent nausea or vomiting Anesthetic complications: no   No notable events documented.  Last Vitals:  Vitals:   02/10/24 0314 02/10/24 0753  BP: (!) 97/55 (!) 98/48  Pulse: 89 77  Resp: 16 19  Temp: 36.8 C 36.9 C  SpO2: 100% 98%    Last Pain:  Vitals:   02/10/24 0800  TempSrc:   PainSc: 7                  [name] Elvis Laufer      "

## 2024-02-11 ENCOUNTER — Encounter (HOSPITAL_COMMUNITY): Payer: Self-pay | Admitting: Orthopedic Surgery

## 2024-02-11 LAB — SURGICAL PATHOLOGY

## 2024-02-11 LAB — PROTIME-INR
INR: 1.1 (ref 0.8–1.2)
Prothrombin Time: 15 s (ref 11.4–15.2)

## 2024-02-11 MED ORDER — WARFARIN SODIUM 5 MG PO TABS
5.0000 mg | ORAL_TABLET | Freq: Every day | ORAL | Status: DC
  Administered 2024-02-11: 5 mg via ORAL
  Filled 2024-02-11 (×2): qty 1

## 2024-02-11 MED ORDER — HYDROCODONE-ACETAMINOPHEN 5-325 MG PO TABS
1.0000 | ORAL_TABLET | ORAL | Status: DC | PRN
  Administered 2024-02-12: 1 via ORAL
  Filled 2024-02-11: qty 1

## 2024-02-11 MED ORDER — HYDROCODONE-ACETAMINOPHEN 10-325 MG PO TABS
1.0000 | ORAL_TABLET | ORAL | Status: DC | PRN
  Administered 2024-02-11 – 2024-02-12 (×7): 1 via ORAL
  Filled 2024-02-11 (×7): qty 1

## 2024-02-11 MED ORDER — WARFARIN - PHYSICIAN DOSING INPATIENT: Freq: Every day | Status: DC

## 2024-02-11 NOTE — Discharge Summary (Signed)
 "  Patient ID: [name] MRN: [id number] DOB/AGE: August 06, 1950 73 y.o.  Admit date: 02/09/2024 Discharge date: 02/12/2024  Admission Diagnoses:  Principal Problem:   S/P lumbar fusion   Discharge Diagnoses:  Principal Problem:   S/P lumbar fusion  status post Procedures: POSTERIOR LUMBAR FUSION 2 LEVEL LAMINECTOMY, SPINE, LUMBAR, WITH EXTRADURAL LESION EXCISION  Past Medical History:  Diagnosis Date   Anemia    take iron   Anxiety    Arthritis    Colitis, collagenous 05/07/2017   Depression    Dysrhythmia 2016   occ due to caffeine intake   Encounter for long-term (current) use of other medications    GERD (gastroesophageal reflux disease)    Hepatitis    age 73   History of hiatal hernia    Hypertension    IBS (irritable bowel syndrome)    Obesity    Osteoarthritis    Pain in joint, multiple sites    Pneumonia 2010   PONV (postoperative nausea and vomiting)    in the 1980's no problems since   Psoriatic arthropathy (HCC)    Pulmonary embolism (HCC) 12/03/2016   submassive/notes 12/03/2016   Raynaud disease     Surgeries: Procedures: POSTERIOR LUMBAR FUSION 2 LEVEL LAMINECTOMY, SPINE, LUMBAR, WITH EXTRADURAL LESION EXCISION on 02/09/2024   Consultants:   Discharged Condition: Improved  Hospital Course: [name] is an 73 y.o. female who was admitted 02/09/2024 for operative treatment of S/P lumbar fusion. Patient failed conservative treatments (please see the history and physical for the specifics) and had severe unremitting pain that affects sleep, daily activities and work/hobbies. After pre-op clearance, the patient was taken to the operating room on 02/09/2024 and underwent  Procedures: POSTERIOR LUMBAR FUSION 2 LEVEL LAMINECTOMY, SPINE, LUMBAR, WITH EXTRADURAL LESION EXCISION.    Patient was given perioperative antibiotics:  Anti-infectives (From admission, onward)    Start     Dose/Rate Route Frequency Ordered Stop   02/09/24 2200   vancomycin  (VANCOREADY) IVPB 500 mg/100 mL        500 mg 100 mL/hr over 60 Minutes Intravenous  Once 02/09/24 1538 02/10/24 1142   02/09/24 0914  vancomycin  (VANCOCIN ) IVPB 1000 mg/200 mL premix        1,000 mg 200 mL/hr over 60 Minutes Intravenous 60 min pre-op 02/09/24 0914 02/09/24 1047        Patient was given sequential compression devices and early ambulation to prevent DVT.   Patient benefited maximally from hospital stay and there were no complications. At the time of discharge, the patient was urinating/moving their bowels without difficulty, tolerating a regular diet, pain is controlled with oral pain medications and they have been cleared by PT/OT.   Recent vital signs: Patient Vitals for the past 24 hrs:  BP Temp Temp src Pulse Resp SpO2  02/12/24 1132 (!) 100/46 98.3 F (36.8 C) -- 77 16 97 %  02/12/24 0800 (!) 107/44 98.4 F (36.9 C) -- 89 16 95 %  02/12/24 0655 (!) 113/53 -- -- -- -- --  02/12/24 0349 (!) 96/53 97.9 F (36.6 C) Oral 70 17 98 %  02/12/24 0049 (!) 100/53 -- -- -- -- --  02/11/24 2331 (!) 92/43 98.2 F (36.8 C) -- 73 17 97 %  02/11/24 2117 (!) 110/52 98 F (36.7 C) -- 83 18 97 %  02/11/24 1521 (!) 107/52 98.2 F (36.8 C) Oral 84 18 98 %  02/11/24 1305 (!) 126/54 98.3 F (36.8 C) Oral 89 19 97 %  Recent laboratory studies:  Recent Labs    02/11/24 0543 02/12/24 0450 02/12/24 1041  WBC  --   --  6.5  HGB  --   --  9.5*  HCT  --   --  29.5*  PLT  --   --  202  INR 1.1 1.1  --      Discharge Medications:     Diagnostic Studies: DG Lumbar Spine 1 View Result Date: 02/09/2024 EXAM: 1 VIEW(S) XRAY OF THE LUMBAR SPINE 02/09/2024 12:20:00 PM COMPARISON: Lumbar spine series dated 02/09/2024. CLINICAL HISTORY: [id number] Surgery, elective [id number] Surgery, elective [phone] FINDINGS: LUMBAR SPINE: BONES: Vertebral body heights are maintained. Alignment is normal. A metallic instrument projects over the posterior aspect of the superior endplate of the  L5 vertebral body. DISCS AND DEGENERATIVE CHANGES: There is moderate disc space narrowing again demonstrated at L3-L4. SOFT TISSUES: No acute abnormality. IMPRESSION: 1. Moderate disc space narrowing at L3-4, similar to 02/09/2024. 2. Metallic instrument projects over the posterior aspect of the superior endplate of the L5 vertebral body. Electronically signed by: Jacqueline Coho MD 02/09/2024 12:34 PM EST RP Workstation: [id number]   DG Lumbar Spine 2-3 Views Result Date: 02/09/2024 EXAM: 2 CROSS-TABLE LATERAL VIEW(S) XRAY OF THE LUMBAR SPINE 02/09/2024 11:48:59 AM COMPARISON: None available. CLINICAL HISTORY: [id number] Surgery, elective [id number] Surgery, elective [phone] FINDINGS: LUMBAR SPINE: BONES: Vertebral body heights are maintained. Alignment is normal. On the first radiograph, there is a linear metallic instrument overlying the spinous process of L2. There is also a linear metallic instrument between the spinous processes of L4 and L5. On the second radiograph, the metallic instrument overlies the facet joints at the L5 vertebral body level. DISCS AND DEGENERATIVE CHANGES: Moderate disc space narrowing at L3-L4. No severe degenerative changes. SOFT TISSUES: No acute abnormality. IMPRESSION: 1. Moderate disc space narrowing at L3-4. Electronically signed by: Jacqueline Coho MD 02/09/2024 12:17 PM EST RP Workstation: [id number]    Discharge Instructions     Call MD / Call 911   Complete by: As directed    If you experience chest pain or shortness of breath, CALL 911 and be transported to the hospital emergency room.  If you develope a fever above 101 F, pus (white drainage) or increased drainage or redness at the wound, or calf pain, call your surgeon's office.   Constipation Prevention   Complete by: As directed    Drink plenty of fluids.  Prune juice may be helpful.  You may use a stool softener, such as Colace (over the counter) 100 mg twice a day.  Use MiraLax  (over the counter) for  constipation as needed.   Increase activity slowly as tolerated   Complete by: As directed    Post-operative opioid taper instructions:   Complete by: As directed    POST-OPERATIVE OPIOID TAPER INSTRUCTIONS: It is important to wean off of your opioid medication as soon as possible. If you do not need pain medication after your surgery it is ok to stop day one. Opioids include: Codeine, Hydrocodone (Norco, Vicodin), Oxycodone (Percocet, oxycontin ) and hydromorphone  amongst others.  Long term and even short term use of opiods can cause: Increased pain response Dependence Constipation Depression Respiratory depression And more.  Withdrawal symptoms can include Flu like symptoms Nausea, vomiting And more Techniques to manage these symptoms Hydrate well Eat regular healthy meals Stay active Use relaxation techniques(deep breathing, meditating, yoga) Do Not substitute Alcohol to help with tapering If you have been on opioids for less than two weeks and do not  have pain than it is ok to stop all together.  Plan to wean off of opioids This plan should start within one week post op of your joint replacement. Maintain the same interval or time between taking each dose and first decrease the dose.  Cut the total daily intake of opioids by one tablet each day Next start to increase the time between doses. The last dose that should be eliminated is the evening dose.           Contact information for follow-up providers     Burnetta Aures, MD. Schedule an appointment as soon as possible for a visit.   Specialty: Orthopedic Surgery Contact information: [address] [place] [address] [place] [address] [phone]              Contact information for after-discharge care     Home Medical Care     [facility]) .   Service: Home Health Services Contact information: [address] [place]  [phone]                      Discharge Plan:  discharge to home   Disposition:  Zeinab is a very pleasant 73 year old female who is POD .SABRA.  from L3-L5 hemilaminectomy and facetectomy with synovial cyst excision and in situ fusion.  Surgical intervention was successful and without complications. Her hospital course has been uncomplicated. She states that her pre-operative leg pain is much improved since surgery. She is ambulating on her own. She is tolerating oral intake well. She is compliant with the LSO brace. She is complaint with the incentive spirometer. She reports that her pain is well controled with oral pain medications. Dressing is c/d/I.  Positive void, positive flatus, positive BM. Patient will follow up with us  in clinic in 2 weeks. Patient understands that she cannot shower for the first 5 days post-op. All questions were welcomed and addressed.    Signed: .... for Dr. Aures [facility] [phone] 02/12/2024, 12:21 PM      "

## 2024-02-11 NOTE — Progress Notes (Addendum)
 Occupational Therapy Treatment Patient Details Name: [name] MRN: [id number] DOB: Feb 09, 1951 Today's Date: 02/11/2024   History of present illness Pt is a 73 y/o F who presents s/p L3-4, L4-5 decompression with L 4 laminectomy and partial laminectomy of L3 and fusion of L4-5 on 02/09/24. PMH inclues anemia, anxiety, HTN, IBS, osteoarthritis, PNA, Raynaud disease   OT comments  Pt making slow progression toward goals, limited by R hip pain today. Pt needs min A for bed mobility via log roll and CGA for transfers with RW. Pt educated on use of reacher for LB ADLs, able to demo with mod A, mod A to don back brace. Pt presenting with impairments listed below, will follow acutely. Continue to recommend HHOT at d/c.       If plan is discharge home, recommend the following:  A little help with walking and/or transfers;A lot of help with bathing/dressing/bathroom;Assistance with cooking/housework;Assist for transportation;Help with stairs or ramp for entrance   Equipment Recommendations  None recommended by OT    Recommendations for Other Services PT consult    Precautions / Restrictions Precautions Precautions: Fall;Back Precaution Booklet Issued: Yes (comment) Recall of Precautions/Restrictions: Intact Precaution/Restrictions Comments: Reviewed handout and pt was cued for precautions during functional mobility. Pt was able to recall 3/3 back precautions. Required Braces or Orthoses: Spinal Brace Spinal Brace: Lumbar corset;Applied in sitting position Restrictions Weight Bearing Restrictions Per Provider Order: No       Mobility Bed Mobility Overal bed mobility: Needs Assistance Bed Mobility: Rolling, Sidelying to Sit Rolling: Min assist Sidelying to sit: Min assist       General bed mobility comments: min A for trunk elevation    Transfers Overall transfer level: Needs assistance Equipment used: Rolling walker (2 wheels) Transfers: Sit to/from Stand Sit to Stand:  Contact guard assist                 Balance Overall balance assessment: Needs assistance Sitting-balance support: Feet supported Sitting balance-Leahy Scale: Good     Standing balance support: During functional activity Standing balance-Leahy Scale: Fair                             ADL either performed or assessed with clinical judgement   ADL Overall ADL's : Needs assistance/impaired                 Upper Body Dressing : Moderate assistance   Lower Body Dressing: Moderate assistance;Cueing for back precautions;With adaptive equipment Lower Body Dressing Details (indicate cue type and reason): with use of reacher Toilet Transfer: Minimal assistance;Ambulation;Rolling walker (2 wheels)   Toileting- Clothing Manipulation and Hygiene: Contact guard assist;Sitting/lateral lean       Functional mobility during ADLs: Minimal assistance;Rolling walker (2 wheels)      Extremity/Trunk Assessment Upper Extremity Assessment Upper Extremity Assessment: Generalized weakness   Lower Extremity Assessment Lower Extremity Assessment: Defer to PT evaluation        Vision   Vision Assessment?: No apparent visual deficits   Perception Perception Perception: Not tested   Praxis Praxis Praxis: Not tested   Communication Communication Communication: No apparent difficulties   Cognition Arousal: Alert Behavior During Therapy: WFL for tasks assessed/performed Cognition: No apparent impairments                               Following commands: Intact  Cueing   Cueing Techniques: Verbal cues  Exercises      Shoulder Instructions       General Comments VSS    Pertinent Vitals/ Pain       Pain Assessment Pain Assessment: Faces Pain Score: 8  Faces Pain Scale: Hurts whole lot Pain Location: back, R hip Pain Descriptors / Indicators: Discomfort Pain Intervention(s): Limited activity within patient's tolerance, Monitored  during session, Repositioned  Home Living                                          Prior Functioning/Environment              Frequency  Min 2X/week        Progress Toward Goals  OT Goals(current goals can now be found in the care plan section)  Progress towards OT goals: Progressing toward goals  Acute Rehab OT Goals Patient Stated Goal: none stated OT Goal Formulation: With patient Time For Goal Achievement: 02/24/24 Potential to Achieve Goals: Good ADL Goals Pt Will Perform Upper Body Dressing: with modified independence;sitting Pt Will Perform Lower Body Dressing: with modified independence;sitting/lateral leans;sit to/from stand Pt Will Transfer to Toilet: with modified independence;ambulating;regular height toilet Pt Will Perform Tub/Shower Transfer: Tub transfer;Shower transfer;shower seat;ambulating;with modified independence  Plan      Co-evaluation                 AM-PAC OT 6 Clicks Daily Activity     Outcome Measure   Help from another person eating meals?: None Help from another person taking care of personal grooming?: A Little Help from another person toileting, which includes using toliet, bedpan, or urinal?: A Little Help from another person bathing (including washing, rinsing, drying)?: A Lot Help from another person to put on and taking off regular upper body clothing?: A Little Help from another person to put on and taking off regular lower body clothing?: A Lot 6 Click Score: 17    End of Session Equipment Utilized During Treatment: Rolling walker (2 wheels);Gait belt;Back brace  OT Visit Diagnosis: Unsteadiness on feet (R26.81);Other abnormalities of gait and mobility (R26.89);Muscle weakness (generalized) (M62.81)   Activity Tolerance Patient tolerated treatment well   Patient Left in chair;with call bell/phone within reach   Nurse Communication Mobility status        Time: 9257-9191 OT Time Calculation  (min): 26 min  Charges: OT General Charges $OT Visit: 1 Visit OT Treatments $Self Care/Home Management : 23-37 mins  Betta Balla K, OTD, OTR/L SecureChat Preferred Acute Rehab [phone]   Laneta POUR Koonce 02/11/2024, 12:18 PM

## 2024-02-11 NOTE — Progress Notes (Signed)
 Physical Therapy Treatment  Patient Details Name: [name] MRN: [id number] DOB: 01-14-1951 Today's Date: 02/11/2024   History of Present Illness Pt is a 73 y/o F who presents s/p L3-4, L4-5 decompression with L 4 laminectomy and partial laminectomy of L3 and fusion of L4-5 on 02/09/24. PMH inclues anemia, anxiety, HTN, IBS, osteoarthritis, PNA, Raynaud disease    PT Comments  Pt progressing slowly with post-op mobility. She was able to demonstrate transfers and ambulation with gross CGA and required min assist for stair training this date. Limited due to new onset R hip pain since initial PT evaluation yesterday morning. After change in pain meds, pt reports feeling comfortable returning home tomorrow. Reinforced education on precautions, brace application/wearing schedule, appropriate activity progression, and car transfer. Will continue to follow.      If plan is discharge home, recommend the following: A little help with walking and/or transfers;A little help with bathing/dressing/bathroom;Assistance with cooking/housework;Assist for transportation;Help with stairs or ramp for entrance   Can travel by private vehicle        Equipment Recommendations  None recommended by PT    Recommendations for Other Services       Precautions / Restrictions Precautions Precautions: Fall;Back Precaution Booklet Issued: Yes (comment) Recall of Precautions/Restrictions: Intact Precaution/Restrictions Comments: Reviewed handout and pt was cued for precautions during functional mobility. Pt was able to recall 3/3 back precautions. Required Braces or Orthoses: Spinal Brace Spinal Brace: Lumbar corset;Applied in sitting position Restrictions Weight Bearing Restrictions Per Provider Order: No     Mobility  Bed Mobility               General bed mobility comments: Pt was received sitting up in the recliner.    Transfers Overall transfer level: Needs assistance Equipment used:  None Transfers: Sit to/from Stand Sit to Stand: Contact guard assist           General transfer comment: VC's for hand placement on seated surface for safety. Increased time and effort due to R hip pain but overall able to stand without physical assistance. Hands on guarding throughout for safety.    Ambulation/Gait Ambulation/Gait assistance: Contact guard assist Gait Distance (Feet): 150 Feet Assistive device: Rolling walker (2 wheels) Gait Pattern/deviations: Step-through pattern, Decreased stride length, Trunk flexed Gait velocity: Decreased Gait velocity interpretation: <1.31 ft/sec, indicative of household ambulator   General Gait Details: VC's for improved posture, closer walker proximity, and forward gaze. No assist required but hands on guarding provided throughout. Pt with extreme forward head posture with rounded shoulders due to kyphosis.   Stairs Stairs: Yes Stairs assistance: Min assist Stair Management: Two rails, Step to pattern, Forwards Number of Stairs: 1 (x2) General stair comments: Pt with increased pain in R hip which limited stairs today. VC's for power up with L. Pt required min assist to boost up, as she was using the RLE to ascend yesterday, however with increased pain could not tolerate today.   Wheelchair Mobility     Tilt Bed    Modified Rankin (Stroke Patients Only)       Balance Overall balance assessment: Needs assistance Sitting-balance support: Feet supported Sitting balance-Leahy Scale: Good     Standing balance support: During functional activity Standing balance-Leahy Scale: Fair                              Musician Communication: No apparent difficulties  Cognition Arousal: Alert Behavior During Therapy: [facility]  for tasks assessed/performed   PT - Cognitive impairments: No apparent impairments                         Following commands: Intact      Cueing Cueing Techniques:  Verbal cues  Exercises      General Comments        Pertinent Vitals/Pain Pain Assessment Pain Assessment: Faces Faces Pain Scale: Hurts even more Pain Location: back, R hip Pain Descriptors / Indicators: Operative site guarding, Sore Pain Intervention(s): Limited activity within patient's tolerance, Monitored during session, Repositioned    Home Living                          Prior Function            PT Goals (current goals can now be found in the care plan section) Acute Rehab PT Goals Patient Stated Goal: Home at d/c PT Goal Formulation: With patient/family Time For Goal Achievement: 02/17/24 Potential to Achieve Goals: Good Progress towards PT goals: Progressing toward goals    Frequency    Min 5X/week      PT Plan      Co-evaluation              AM-PAC PT 6 Clicks Mobility   Outcome Measure  Help needed turning from your back to your side while in a flat bed without using bedrails?: A Little Help needed moving from lying on your back to sitting on the side of a flat bed without using bedrails?: A Little Help needed moving to and from a bed to a chair (including a wheelchair)?: A Little Help needed standing up from a chair using your arms (e.g., wheelchair or bedside chair)?: A Little Help needed to walk in hospital room?: A Little Help needed climbing 3-5 steps with a railing? : A Little 6 Click Score: 18    End of Session Equipment Utilized During Treatment: Gait belt;Back brace Activity Tolerance: Patient limited by pain Patient left: in chair;with call bell/phone within reach;with family/visitor present Nurse Communication: Mobility status PT Visit Diagnosis: Unsteadiness on feet (R26.81);Pain Pain - Right/Left: Right Pain - part of body: Hip (back)     Time: 9080-9056 PT Time Calculation (min) (ACUTE ONLY): 24 min  Charges:    $Gait Training: 23-37 mins PT General Charges $$ ACUTE PT VISIT: 1 Visit                      Leita Sable, PT, DPT Acute Rehabilitation Services Secure Chat Preferred Office: [phone]    Leita JONETTA Sable 02/11/2024, 10:03 AM

## 2024-02-11 NOTE — Progress Notes (Signed)
 PHARMACY - ANTICOAGULATION CONSULT NOTE  Pharmacy Consult for Warfarin Indication: Continuation of warfarin for h/o PE  Allergies[1]  Patient Measurements: Height: 4' 10 (147.3 cm) Weight: 59.3 kg (130 lb 12.8 oz) IBW/kg (Calculated) : 40.9 HEPARIN  DW (KG): 53.6  Vital Signs: Temp: 98.4 F (36.9 C) (12/24 0743) Temp Source: Oral (12/24 0743) BP: 116/56 (12/24 0743) Pulse Rate: 101 (12/24 0743)  Labs: Recent Labs    02/09/24 0947 02/11/24 0543  LABPROT 13.4 15.0  INR 1.0 1.1    Estimated Creatinine Clearance: 49.2 mL/min (by C-G formula based on SCr of 0.48 mg/dL).   Medical History: Past Medical History:  Diagnosis Date   Anemia    take iron   Anxiety    Arthritis    Colitis, collagenous 05/07/2017   Depression    Dysrhythmia 2016   occ due to caffeine intake   Encounter for long-term (current) use of other medications    GERD (gastroesophageal reflux disease)    Hepatitis    age 73   History of hiatal hernia    Hypertension    IBS (irritable bowel syndrome)    Obesity    Osteoarthritis    Pain in joint, multiple sites    Pneumonia 2010   PONV (postoperative nausea and vomiting)    in the 1980's no problems since   Psoriatic arthropathy (HCC)    Pulmonary embolism (HCC) 12/03/2016   submassive/notes 12/03/2016   Raynaud disease     Medications:  Medications Prior to Admission  Medication Sig Dispense Refill Last Dose/Taking   acetaminophen  (TYLENOL ) 500 MG tablet Take 500-1,000 mg by mouth every 6 (six) hours as needed (pain).   02/09/2024 Morning   ALPRAZolam  (XANAX ) 0.5 MG tablet TAKE 1 TABLET (0.5 MG TOTAL) BY MOUTH 3 (THREE) TIMES DAILY AS NEEDED FOR ANXIETY. (Patient taking differently: Take 0.5 mg by mouth See admin instructions. Take 2 tablets (1 mg) by mouth scheduled at bedtime, may take 1 tablet (0.5 mg) by mouth during the day if needed for anxiety.) 90 tablet 2 02/09/2024 at  7:40 AM   benazepril  (LOTENSIN ) 20 MG tablet Take 1 tablet (20  mg total) by mouth daily. 90 tablet 3 02/08/2024   Cholecalciferol  (VITAMIN D -3) 125 MCG (5000 UT) TABS Take 5,000 Units by mouth daily.   Past Week   cyanocobalamin  (VITAMIN B12) 1000 MCG tablet Take 1,000 mcg by mouth daily.   Past Week   diclofenac  (VOLTAREN ) 75 MG EC tablet TAKE ONE OR TWO TABS BY MOUTH DAILY AS NEEDED FOR PAIN (Patient taking differently: Take 75 mg by mouth in the morning.) 180 tablet 3 02/02/2024   esomeprazole  (NEXIUM ) 40 MG capsule Take 40 mg by mouth every morning.   02/09/2024 at  7:00 AM   famotidine (PEPCID) 40 MG tablet Take 40 mg by mouth daily at 8 pm.   02/09/2024 at  7:00 AM   fluconazole  (DIFLUCAN ) 100 MG tablet TAKE 1 TABLET (100 MG TOTAL) BY MOUTH EVERY MONDAY. 12 tablet 3 Past Week   furosemide  (LASIX ) 20 MG tablet TAKE 2 TABLETS BY MOUTH TWICE A DAY 360 tablet 1 02/08/2024   gabapentin  (NEURONTIN ) 100 MG capsule TAKE 2 CAPSULES BY MOUTH 3 TIMES A DAY 540 capsule 3 02/09/2024 at  7:00 AM   QUEtiapine  (SEROQUEL ) 25 MG tablet TAKE 1 TABLET BY MOUTH EVERYDAY AT BEDTIME 90 tablet 1 02/08/2024   venlafaxine  XR (EFFEXOR -XR) 150 MG 24 hr capsule TAKE 1 CAPSULE BY MOUTH EVERY DAY (Patient taking differently: Take 150 mg  by mouth at bedtime.) 90 capsule 3 02/08/2024   venlafaxine  XR (EFFEXOR -XR) 75 MG 24 hr capsule TAKE 1 CAPSULE BY MOUTH EVERY DAY 90 capsule 3 02/08/2024   warfarin (COUMADIN ) 2 MG tablet TAKE 4 TABLETS A DAY AS DIRECTED BY MOUTH (Patient taking differently: Take 5 mg by mouth daily with lunch.) 360 tablet 3 02/04/2024    Assessment: Patient with h/o PE in 2018 on 5 mg Daily of warfarin PTA. Last seen in [facility] clinic 01/29/24 with an INR of 2. Patient on 35mg  per week. Patient s/p posterior lumbar fusion and laminectomy POD 2. Pharmacy now consulted to assist and manage warfarin while inpatient. D/w Ortho PA and MD to suggest continuing low dose lovenox  as a bridge while reinitiating warfarin, Will continue home dose today, with concurrent lovenox  40mg   daily. Would suggested arranging for patient to see [facility] clinic perhaps on Monday 12/29 (or 12/26 if available). Patient was to see them on 12/19 per documentation.   Goal of Therapy:  INR 2-3 Monitor platelets by anticoagulation protocol: Yes   Plan:  Warfarin 5mg  Po daily x 2 Continue Lovenox  40mg  daily for now.  Daily INR Please arrange anicoagulation clinic f/u as soon as possible after discharge.  Fareedah Mahler A. Lyle, PharmD, BCPS, FNKF Clinical Pharmacist [place] Please utilize Amion for appropriate phone number to reach the unit pharmacist [facility])  02/11/2024,8:37 AM      [1]  Allergies Allergen Reactions   Cephalosporins Diarrhea, Nausea And Vomiting and Other (See Comments)    Cephalexin doses > 250mg  cause abdominal pain   Other Other (See Comments)    Dissolvable Stitches after Gastric Bypass surgery caused Multiple Abscesses at the site- Pt states Bright Green in color.   Amoxicillin -Pot Clavulanate Diarrhea    Other Reaction(s): Not available   Vioxx [Rofecoxib] Swelling    EXTREME LEG SWELLING    Banana Extract Allergy Skin Test Dermatitis and Itching   Bactrim  [Sulfamethoxazole -Trimethoprim ] Diarrhea and Nausea And Vomiting   Banana Itching and Rash   Ciprofloxacin  Diarrhea and Nausea And Vomiting   Coconut Flavoring Agent (Non-Screening) Itching and Rash   Codeine Nausea Only   Zithromax [Azithromycin] Diarrhea and Nausea And Vomiting

## 2024-02-11 NOTE — Progress Notes (Signed)
 Report given to receiving RN on 3W. Patient alert and oriented, pain medication given prior to transfer. Family at bedside and all belongings with family

## 2024-02-11 NOTE — Progress Notes (Signed)
" ° ° °  Subjective: Procedures (LRB): POSTERIOR LUMBAR FUSION 2 LEVEL (N/A) LAMINECTOMY, SPINE, LUMBAR, WITH EXTRADURAL LESION EXCISION (N/A) 2 Days Post-Op  Patient reports pain as 4 on 0-10 scale.  Reports none leg pain reports incisional back pain   Positive void Negative bowel movement Positive flatus Negative chest pain or shortness of breath  Objective: Vital signs in last 24 hours: Temp:  [98 F (36.7 C)-100.8 F (38.2 C)] 99.1 F (37.3 C) (12/24 0452) Pulse Rate:  [73-95] 86 (12/24 0452) Resp:  [15-19] 18 (12/24 0452) BP: (98-118)/(48-58) 112/55 (12/24 0452) SpO2:  [98 %-100 %] 98 % (12/24 0452)  Intake/Output from previous day: 12/23 0701 - 12/24 0700 In: 720 [P.O.:720] Out: -   Labs: No results for input(s): WBC, RBC, HCT, PLT in the last 72 hours. No results for input(s): NA, K, CL, CO2, BUN, CREATININE, GLUCOSE, CALCIUM  in the last 72 hours. Recent Labs    02/09/24 0947 02/11/24 0543  INR 1.0 1.1    Physical Exam: Neurologically intact ABD soft Intact pulses distally Dorsiflexion/Plantar flexion intact Incision: dressing C/D/I No cellulitis present Compartment soft Body mass index is 27.34 kg/m.   Assessment/Plan: Patient stable with resolution of right leg pain; patient complains of incisional back pain  Lovenox  was started yesterday and the patient tolerated it well  Plan: Patient will d/c lovenox  and restart her coumadin  this morning We will transfer the patient to 3W as she is still having pain with ambulation  We will d/c the oxycodone  10/325 and switch it to hydrocodone  10/325 as the oxycodone  is causing pruritus and the patient state that it is not working well for her.  Continue to mobilize with PT  Plan for d/c possibly tomorrow pending PT clearance and pain control   Patient was seen on rounds today with Dr. Donaciano Sprang, MD  Jeoffrey Sages PA-C for Dr. Donaciano Sprang, MD [facility] [phone]  "

## 2024-02-12 LAB — CBC
HCT: 29.5 % — ABNORMAL LOW (ref 36.0–46.0)
Hemoglobin: 9.5 g/dL — ABNORMAL LOW (ref 12.0–15.0)
MCH: 27 pg (ref 26.0–34.0)
MCHC: 32.2 g/dL (ref 30.0–36.0)
MCV: 83.8 fL (ref 80.0–100.0)
Platelets: 202 K/uL (ref 150–400)
RBC: 3.52 MIL/uL — ABNORMAL LOW (ref 3.87–5.11)
RDW: 14.2 % (ref 11.5–15.5)
WBC: 6.5 K/uL (ref 4.0–10.5)
nRBC: 0 % (ref 0.0–0.2)

## 2024-02-12 LAB — PROTIME-INR
INR: 1.1 (ref 0.8–1.2)
Prothrombin Time: 15 s (ref 11.4–15.2)

## 2024-02-12 MED ORDER — HYDROCODONE-ACETAMINOPHEN 5-325 MG PO TABS: 1.0000 | ORAL_TABLET | ORAL | 0 refills | Status: DC | PRN

## 2024-02-12 MED ORDER — METHOCARBAMOL 500 MG PO TABS: 500.0000 mg | ORAL_TABLET | Freq: Four times a day (QID) | ORAL | 0 refills | Status: AC | PRN

## 2024-02-12 MED ORDER — METHOCARBAMOL 500 MG PO TABS: 500.0000 mg | ORAL_TABLET | Freq: Four times a day (QID) | ORAL | 0 refills | Status: DC | PRN

## 2024-02-12 MED ORDER — HYDROCODONE-ACETAMINOPHEN 10-325 MG PO TABS: 1.0000 | ORAL_TABLET | ORAL | 0 refills | Status: DC | PRN

## 2024-02-12 MED ORDER — HYDROCODONE-ACETAMINOPHEN 10-325 MG PO TABS: 1.0000 | ORAL_TABLET | ORAL | 0 refills | Status: AC | PRN

## 2024-02-12 NOTE — Progress Notes (Signed)
" ° °  Subjective:  73 y.o. female now POD 3 from L3-4, L4-5 decompression with L 4 laminectomy and partial laminectomy of L3 and fusion of L4-5 on 02/09/24 . Patient reports that her pain is controlled  Otherwise, no acute events overnight. No numbness or tingling to the extremity.  Objective:   VITALS:   Vitals:   02/12/24 0049 02/12/24 0349 02/12/24 0655 02/12/24 0800  BP: (!) 100/53 (!) 96/53 (!) 113/53 (!) 107/44  Pulse:  70  89  Resp:  17  16  Temp:  97.9 F (36.6 C)  98.4 F (36.9 C)  TempSrc:  Oral    SpO2:  98%  95%  Weight:      Height:        Physical Exam: General: Alert, no acute distress Cardiovascular: No pedal edema Respiratory: No cyanosis, no use of accessory musculature GI: No organomegaly, abdomen is soft and non-tender Skin: No lesions in the area of chief complaint Neurologic: Sensation intact distally Psychiatric: Patient is competent for consent with normal mood and affect Lymphatic: No axillary or cervical lymphadenopathy  MUSCULOSKELETAL: Lumbar spine - Dressing c/d/i - TTP peri incisionally - Motor intact 5/5 to L3-S1 nerve distributions - Sensation intact to light touch L3-S1 nerve distributions - 2+ DP pulse  Lab Results  Component Value Date   WBC 6.5 02/12/2024   HGB 9.5 (L) 02/12/2024   HCT 29.5 (L) 02/12/2024   MCV 83.8 02/12/2024   PLT 202 02/12/2024   BMET    Component Value Date/Time   NA 135 02/02/2024 1100   NA 137 12/10/2023 1121   K 4.9 02/02/2024 1100   CL 102 02/02/2024 1100   CO2 27 02/02/2024 1100   GLUCOSE 97 02/02/2024 1100   BUN 12 02/02/2024 1100   BUN 12 12/10/2023 1121   CREATININE 0.48 02/02/2024 1100   CREATININE 0.55 08/02/2015 1236   CALCIUM  8.1 (L) 02/02/2024 1100   EGFR 101 12/10/2023 1121   GFRNONAA >60 02/02/2024 1100   GFRNONAA >89 08/02/2015 1236     Assessment/Plan: 73 y.o. female  now 3 Days Post-Op from Principal Problem:   S/P lumbar fusion .  Patient is doing well. The patient's pain is  controlled and she reports that she did well with physical therapy. Plan to discharge home today  Weight bearing: as tolerated with LSO Pain control: norco 10/325 PT/OT DVT ppx: warfarin Bowel regimen: docusate Dispo: Discharge today   Rankin LELON Pizza 02/12/2024, 11:28 AM  "

## 2024-02-12 NOTE — Progress Notes (Addendum)
 Physical Therapy Treatment Patient Details Name: [name] MRN: [id number] DOB: [birth year] Today's Date: 02/12/2024   History of Present Illness Pt is a 73 y/o F who presents s/p L3-4, L4-5 decompression with L 4 laminectomy and partial laminectomy of L3 and fusion of L4-5 on 02/09/24. PMH inclues anemia, anxiety, HTN, IBS, osteoarthritis, PNA, Raynaud disease    PT Comments  Pt continues to progress towards goals. Currently pt is CGA to Min A for bed mobility. Practiced getting up/down off steps today with RW/bil UE support in order to perform stairs per home set up and step up onto stool to get on/off bed at home. Pt was CGA with verbal cues for sequencing. Pt CGA for sit to stand from lowered surface with correct hand placement on bed and RW for stability and support to help with initial momentum to get to standing from sitting. Pt is cleared from a physical therapy perspective to return home with family once medically stable. Due to pt current functional status, home set up and available assistance at home recommending skilled physical therapy services 3x/week in order to address strength, balance and functional mobility to decrease risk for falls, injury and re-hospitalization.       If plan is discharge home, recommend the following: A little help with walking and/or transfers;A little help with bathing/dressing/bathroom;Assistance with cooking/housework;Assist for transportation;Help with stairs or ramp for entrance     Equipment Recommendations  None recommended by PT       Precautions / Restrictions Precautions Precautions: Fall;Back Precaution Booklet Issued: Yes (comment) Recall of Precautions/Restrictions: Intact Precaution/Restrictions Comments: able to recall back precautions Required Braces or Orthoses: Spinal Brace Spinal Brace: Lumbar corset;Other (comment) Spinal Brace Comments: on when I entered room Restrictions Weight Bearing Restrictions Per Provider Order: No      Mobility  Bed Mobility Overal bed mobility: Needs Assistance Bed Mobility: Rolling, Sidelying to Sit, Sit to Sidelying Rolling: Contact guard assist Sidelying to sit: Contact guard assist, Min assist     Sit to sidelying: Min assist General bed mobility comments: increased time and cues for technique with CGA for trunk with an emphasis on back precautions.CGA second attempt, Min A first attempt. MIn A for LLE to get to supine.    Transfers Overall transfer level: Needs assistance Equipment used: Rolling walker (2 wheels) Transfers: Sit to/from Stand Sit to Stand: Contact guard assist           General transfer comment: CGA from lowered surface with cues for hand placement on bed and RW    Ambulation/Gait Ambulation/Gait assistance: Contact guard assist Gait Distance (Feet): 75 Feet Assistive device: Rolling walker (2 wheels) Gait Pattern/deviations: Step-through pattern, Decreased stride length, Trunk flexed Gait velocity: Decreased Gait velocity interpretation: <1.31 ft/sec, indicative of household ambulator   General Gait Details: Pt with extreme forward head posture with rounded shoulders due to kyphosis.   Stairs   Stairs assistance: Contact guard assist Stair Management: Backwards, Forwards, With walker, Step to pattern Number of Stairs: 2 General stair comments: practiced stepping up/down off stool get to get into tall bed at home and for stairs per home set up. PT will have spouse, daughter and possibly son in law at home to assist with stairs.     Balance Overall balance assessment: Needs assistance Sitting-balance support: Feet supported Sitting balance-Leahy Scale: Good     Standing balance support: Single extremity supported, Bilateral upper extremity supported, During functional activity Standing balance-Leahy Scale: Fair Standing balance comment: able to stand without  UE support briefly        Communication Communication Communication: No  apparent difficulties  Cognition Arousal: Alert Behavior During Therapy: WFL for tasks assessed/performed   PT - Cognitive impairments: No apparent impairments     Following commands: Intact      Cueing Cueing Techniques: Verbal cues     General Comments General comments (skin integrity, edema, etc.): granddaughter present during session      Pertinent Vitals/Pain Pain Assessment Pain Assessment: Faces Faces Pain Scale: Hurts little more Pain Location: back, R hip Pain Descriptors / Indicators: Discomfort, Grimacing Pain Intervention(s): Monitored during session, Limited activity within patient's tolerance, Repositioned     PT Goals (current goals can now be found in the care plan section) Acute Rehab PT Goals Patient Stated Goal: Home at d/c PT Goal Formulation: With patient/family Time For Goal Achievement: 02/17/24 Potential to Achieve Goals: Good Progress towards PT goals: Progressing toward goals    Frequency    Min 5X/week      PT Plan  Continue with current POC        AM-PAC PT 6 Clicks Mobility   Outcome Measure  Help needed turning from your back to your side while in a flat bed without using bedrails?: A Little Help needed moving from lying on your back to sitting on the side of a flat bed without using bedrails?: A Little Help needed moving to and from a bed to a chair (including a wheelchair)?: A Little Help needed standing up from a chair using your arms (e.g., wheelchair or bedside chair)?: A Little Help needed to walk in hospital room?: A Little Help needed climbing 3-5 steps with a railing? : A Little 6 Click Score: 18    End of Session Equipment Utilized During Treatment: Gait belt;Back brace Activity Tolerance: Patient tolerated treatment well Patient left: with call bell/phone within reach;with family/visitor present;in bed Nurse Communication: Mobility status PT Visit Diagnosis: Unsteadiness on feet (R26.81);Pain Pain - Right/Left:  Right Pain - part of body: Hip     Time: 1212-1238 PT Time Calculation (min) (ACUTE ONLY): 26 min  Charges:    $Therapeutic Activity: 23-37 mins PT General Charges $$ ACUTE PT VISIT: 1 Visit                     Dorothyann Maier, DPT, CLT  Acute Rehabilitation Services Office: [phone] (Secure chat preferred)    Dorothyann VEAR Maier 02/12/2024, 12:48 PM

## 2024-02-12 NOTE — TOC Transition Note (Signed)
 Transition of Care [facility]) - Discharge Note   Patient Details  Name: [name] MRN: [id number] Date of Birth: 01/07/1951  Transition of Care [facility]) CM/SW Contact:  Tom-Johnson, Harvest Muskrat, RN Phone Number: 02/12/2024, 12:52 PM   Clinical Narrative:     Patient is scheduled for discharge today.  Readmission Risk Assessment done. Home health info, Outpatient f/u, hospital f/u and discharge instructions on AVS. Daughter, Randall to transport at discharge.  No further ICM needs noted.     Final next level of care: Home w Home Health Services Barriers to Discharge: Barriers Resolved   Patient Goals and CMS Choice Patient states their goals for this hospitalization and ongoing recovery are:: To return home CMS Medicare.gov Compare Post Acute Care list provided to:: Patient Choice offered to / list presented to : NA      Discharge Placement                Patient to be transferred to facility by: Daughter Name of family member notified: Randall    Discharge Plan and Services Additional resources added to the After Visit Summary for                            [facility]: PT, OT [facility] Agency: [facility] Date [facility] Agency Contacted: 02/10/24   Representative spoke with at [facility] Agency: Amy  Social Drivers of Health (SDOH) Interventions SDOH Screenings   Food Insecurity: Food Insecurity Present (12/05/2023)  Housing: Low Risk (12/05/2023)  Transportation Needs: No Transportation Needs (12/05/2023)  Utilities: Not At Risk (07/24/2023)  Alcohol Screen: Low Risk (07/24/2023)  Depression (PHQ2-9): Low Risk (12/10/2023)  Financial Resource Strain: Low Risk (12/05/2023)  Physical Activity: Inactive (12/05/2023)  Social Connections: Moderately Isolated (12/05/2023)  Stress: No Stress Concern Present (12/05/2023)  Tobacco Use: Low Risk (02/09/2024)  Health Literacy: Adequate Health Literacy (07/24/2023)     Readmission Risk Interventions    02/12/2024   12:51  PM  Readmission Risk Prevention Plan  Post Dischage Appt Complete  Medication Screening Complete  Transportation Screening Complete

## 2024-02-12 NOTE — Progress Notes (Signed)
 PHARMACY - ANTICOAGULATION CONSULT NOTE  Pharmacy Consult for Warfarin Indication: Continuation of warfarin for h/o PE  Allergies[1]  Patient Measurements: Height: 4' 10 (147.3 cm) Weight: 59.3 kg (130 lb 12.8 oz) IBW/kg (Calculated) : 40.9 HEPARIN  DW (KG): 53.6  Vital Signs: Temp: 98.4 F (36.9 C) (12/25 0800) Temp Source: Oral (12/25 0349) BP: 107/44 (12/25 0800) Pulse Rate: 89 (12/25 0800)  Labs: Recent Labs    02/11/24 0543 02/12/24 0450  LABPROT 15.0 15.0  INR 1.1 1.1    Estimated Creatinine Clearance: 49.2 mL/min (by C-G formula based on SCr of 0.48 mg/dL).   Medical History: Past Medical History:  Diagnosis Date   Anemia    take iron   Anxiety    Arthritis    Colitis, collagenous 05/07/2017   Depression    Dysrhythmia 2016   occ due to caffeine intake   Encounter for long-term (current) use of other medications    GERD (gastroesophageal reflux disease)    Hepatitis    age 73   History of hiatal hernia    Hypertension    IBS (irritable bowel syndrome)    Obesity    Osteoarthritis    Pain in joint, multiple sites    Pneumonia 2010   PONV (postoperative nausea and vomiting)    in the 1980's no problems since   Psoriatic arthropathy (HCC)    Pulmonary embolism (HCC) 12/03/2016   submassive/notes 12/03/2016   Raynaud disease     Medications:  Medications Prior to Admission  Medication Sig Dispense Refill Last Dose/Taking   acetaminophen  (TYLENOL ) 500 MG tablet Take 500-1,000 mg by mouth every 6 (six) hours as needed (pain).   02/09/2024 Morning   ALPRAZolam  (XANAX ) 0.5 MG tablet TAKE 1 TABLET (0.5 MG TOTAL) BY MOUTH 3 (THREE) TIMES DAILY AS NEEDED FOR ANXIETY. (Patient taking differently: Take 0.5 mg by mouth See admin instructions. Take 2 tablets (1 mg) by mouth scheduled at bedtime, may take 1 tablet (0.5 mg) by mouth during the day if needed for anxiety.) 90 tablet 2 02/09/2024 at  7:40 AM   benazepril  (LOTENSIN ) 20 MG tablet Take 1 tablet (20  mg total) by mouth daily. 90 tablet 3 02/08/2024   Cholecalciferol  (VITAMIN D -3) 125 MCG (5000 UT) TABS Take 5,000 Units by mouth daily.   Past Week   cyanocobalamin  (VITAMIN B12) 1000 MCG tablet Take 1,000 mcg by mouth daily.   Past Week   diclofenac  (VOLTAREN ) 75 MG EC tablet TAKE ONE OR TWO TABS BY MOUTH DAILY AS NEEDED FOR PAIN (Patient taking differently: Take 75 mg by mouth in the morning.) 180 tablet 3 02/02/2024   esomeprazole  (NEXIUM ) 40 MG capsule Take 40 mg by mouth every morning.   02/09/2024 at  7:00 AM   famotidine (PEPCID) 40 MG tablet Take 40 mg by mouth daily at 8 pm.   02/09/2024 at  7:00 AM   fluconazole  (DIFLUCAN ) 100 MG tablet TAKE 1 TABLET (100 MG TOTAL) BY MOUTH EVERY MONDAY. 12 tablet 3 Past Week   furosemide  (LASIX ) 20 MG tablet TAKE 2 TABLETS BY MOUTH TWICE A DAY 360 tablet 1 02/08/2024   gabapentin  (NEURONTIN ) 100 MG capsule TAKE 2 CAPSULES BY MOUTH 3 TIMES A DAY 540 capsule 3 02/09/2024 at  7:00 AM   QUEtiapine  (SEROQUEL ) 25 MG tablet TAKE 1 TABLET BY MOUTH EVERYDAY AT BEDTIME 90 tablet 1 02/08/2024   venlafaxine  XR (EFFEXOR -XR) 150 MG 24 hr capsule TAKE 1 CAPSULE BY MOUTH EVERY DAY (Patient taking differently: Take 150 mg  by mouth at bedtime.) 90 capsule 3 02/08/2024   venlafaxine  XR (EFFEXOR -XR) 75 MG 24 hr capsule TAKE 1 CAPSULE BY MOUTH EVERY DAY 90 capsule 3 02/08/2024   warfarin (COUMADIN ) 2 MG tablet TAKE 4 TABLETS A DAY AS DIRECTED BY MOUTH (Patient taking differently: Take 5 mg by mouth daily with lunch.) 360 tablet 3 02/04/2024    Assessment: Patient with h/o PE in 2018 on 5 mg Daily of warfarin PTA. Last seen in [facility] clinic 01/29/24 with an INR of 2. Patient on 35mg  per week. Patient s/p posterior lumbar fusion and laminectomy POD 2. Pharmacy now consulted to assist and manage warfarin while inpatient. D/w Ortho PA and MD to suggest continuing low dose lovenox  as a bridge while reinitiating warfarin, Will continue home dose today, with concurrent lovenox  40mg   daily. Would suggested arranging for patient to see [facility] clinic perhaps on Monday 12/29 (or 12/26 if available). Patient was to see them on 12/19 per documentation.   12/25: INR today 1.1 is subtherapeutic after resuming warfarin yesterday. Will continue home dose today as patient has only received 1 dose of warfarin so far. CBC has been ordered to monitor hemoglobin and platelets. No signs of bleeding have been documented.  Goal of Therapy:  INR 2-3 Monitor platelets by anticoagulation protocol: Yes   Plan:  Warfarin 5mg  PO x1 today Continue Lovenox  40mg  daily until INR therapeutic Daily INR Monitor for new DDI Please arrange anicoagulation clinic f/u as soon as possible after discharge.  Thank you for allowing pharmacy to be involved with this patient's care.  Mendel Barter, PharmD PGY1 Clinical Pharmacist [facility]  02/12/2024 9:59 AM    [1]  Allergies Allergen Reactions   Cephalosporins Diarrhea, Nausea And Vomiting and Other (See Comments)    Cephalexin doses > 250mg  cause abdominal pain   Other Other (See Comments)    Dissolvable Stitches after Gastric Bypass surgery caused Multiple Abscesses at the site- Pt states Bright Green in color.   Amoxicillin -Pot Clavulanate Diarrhea    Other Reaction(s): Not available   Vioxx [Rofecoxib] Swelling    EXTREME LEG SWELLING    Banana Extract Allergy Skin Test Dermatitis and Itching   Bactrim  [Sulfamethoxazole -Trimethoprim ] Diarrhea and Nausea And Vomiting   Banana Itching and Rash   Ciprofloxacin  Diarrhea and Nausea And Vomiting   Coconut Flavoring Agent (Non-Screening) Itching and Rash   Codeine Nausea Only   Zithromax [Azithromycin] Diarrhea and Nausea And Vomiting

## 2024-02-12 NOTE — Progress Notes (Signed)
 Occupational Therapy Treatment Patient Details Name: [name] MRN: [id number] DOB: [birth year] Today's Date: 02/12/2024   History of present illness Pt is a 73 y/o F who presents s/p L3-4, L4-5 decompression with L 4 laminectomy and partial laminectomy of L3 and fusion of L4-5 on 02/09/24. PMH inclues anemia, anxiety, HTN, IBS, osteoarthritis, PNA, Raynaud disease   OT comments  Patient continues to demonstrate good gains with OT treatment. Patient attempts tasks without assistance but requires CGA at times for sit to stands. Patient able to stand at sink for self care with 50% performed in sitting.  Patient declined sock aide training stating her husband assists at home.  Discharge recommendations continue to be appropriate for home with family to assist. Acute OT to continue to follow to address established goals.       If plan is discharge home, recommend the following:  A little help with walking and/or transfers;A lot of help with bathing/dressing/bathroom;Assistance with cooking/housework;Assist for transportation;Help with stairs or ramp for entrance   Equipment Recommendations  None recommended by OT    Recommendations for Other Services      Precautions / Restrictions Precautions Precautions: Fall;Back Precaution Booklet Issued: Yes (comment) Recall of Precautions/Restrictions: Intact Precaution/Restrictions Comments: able to recall back precautions Required Braces or Orthoses: Spinal Brace Spinal Brace: Lumbar corset;Applied in sitting position Restrictions Weight Bearing Restrictions Per Provider Order: No       Mobility Bed Mobility Overal bed mobility: Needs Assistance Bed Mobility: Rolling, Sidelying to Sit Rolling: Contact guard assist Sidelying to sit: Contact guard assist       General bed mobility comments: increased time and cues for technique with CGA for trunk    Transfers Overall transfer level: Needs assistance Equipment used: Rolling walker  (2 wheels) Transfers: Sit to/from Stand Sit to Stand: Contact guard assist, From elevated surface           General transfer comment: cus for hand placement, supervision from raised bed from height patient states her bed at home sits     Balance Overall balance assessment: Needs assistance Sitting-balance support: Feet supported Sitting balance-Leahy Scale: Good     Standing balance support: Single extremity supported, Bilateral upper extremity supported, During functional activity Standing balance-Leahy Scale: Fair Standing balance comment: able to stand without UE support when pulling up underwear                           ADL either performed or assessed with clinical judgement   ADL Overall ADL's : Needs assistance/impaired     Grooming: Supervision/safety Grooming Details (indicate cue type and reason): able to perform oral care with cup method without cues Upper Body Bathing: Supervision/ safety;Sitting   Lower Body Bathing: Moderate assistance   Upper Body Dressing : Moderate assistance Upper Body Dressing Details (indicate cue type and reason): to donn brace Lower Body Dressing: Moderate assistance;Cueing for back precautions;With adaptive equipment Lower Body Dressing Details (indicate cue type and reason): with use of reacher Toilet Transfer: Contact guard assist;Ambulation;Regular Psychiatrist Details (indicate cue type and reason): BSC over toilet Toileting- Clothing Manipulation and Hygiene: Modified independent;Sitting/lateral lean Toileting - Clothing Manipulation Details (indicate cue type and reason): able to manage underwear when standing with supervision     Functional mobility during ADLs: Contact guard assist;Rolling walker (2 wheels) General ADL Comments: declined education or practice with sock aide, my husband is my sock aide    Extremity/Trunk Assessment  Vision       Perception     Praxis      Communication Communication Communication: No apparent difficulties   Cognition Arousal: Alert Behavior During Therapy: WFL for tasks assessed/performed Cognition: No apparent impairments                               Following commands: Intact        Cueing   Cueing Techniques: Verbal cues  Exercises      Shoulder Instructions       General Comments VSS    Pertinent Vitals/ Pain       Pain Assessment Pain Assessment: Faces Faces Pain Scale: Hurts little more Pain Location: back, R hip Pain Descriptors / Indicators: Discomfort, Grimacing Pain Intervention(s): Limited activity within patient's tolerance, Monitored during session, Repositioned  Home Living                                          Prior Functioning/Environment              Frequency  Min 2X/week        Progress Toward Goals  OT Goals(current goals can now be found in the care plan section)  Progress towards OT goals: Progressing toward goals  Acute Rehab OT Goals Patient Stated Goal: to go home OT Goal Formulation: With patient Time For Goal Achievement: 02/24/24 Potential to Achieve Goals: Good ADL Goals Pt Will Perform Upper Body Dressing: with modified independence;sitting Pt Will Perform Lower Body Dressing: with modified independence;sitting/lateral leans;sit to/from stand Pt Will Transfer to Toilet: with modified independence;ambulating;regular height toilet Pt Will Perform Tub/Shower Transfer: Tub transfer;Shower transfer;shower seat;ambulating;with modified independence  Plan      Co-evaluation                 AM-PAC OT 6 Clicks Daily Activity     Outcome Measure   Help from another person eating meals?: None Help from another person taking care of personal grooming?: A Little Help from another person toileting, which includes using toliet, bedpan, or urinal?: A Little Help from another person bathing (including washing,  rinsing, drying)?: A Lot Help from another person to put on and taking off regular upper body clothing?: A Little Help from another person to put on and taking off regular lower body clothing?: A Lot 6 Click Score: 17    End of Session Equipment Utilized During Treatment: Rolling walker (2 wheels);Gait belt;Back brace  OT Visit Diagnosis: Unsteadiness on feet (R26.81);Other abnormalities of gait and mobility (R26.89);Muscle weakness (generalized) (M62.81)   Activity Tolerance Patient tolerated treatment well   Patient Left in chair;with call bell/phone within reach   Nurse Communication Mobility status        Time: 9168-9083 OT Time Calculation (min): 45 min  Charges: OT General Charges $OT Visit: 1 Visit OT Treatments $Self Care/Home Management : 38-52 mins  Dick [name], OTA Acute Rehabilitation Services  Office [phone]   [name] 02/12/2024, 10:28 AM

## 2024-02-13 ENCOUNTER — Encounter: Payer: Self-pay | Admitting: Family Medicine

## 2024-02-15 ENCOUNTER — Encounter: Payer: Self-pay | Admitting: Family Medicine

## 2024-02-15 DIAGNOSIS — Z7901 Long term (current) use of anticoagulants: Secondary | ICD-10-CM

## 2024-02-16 ENCOUNTER — Telehealth: Payer: Self-pay | Admitting: Family Medicine

## 2024-02-16 ENCOUNTER — Telehealth (INDEPENDENT_AMBULATORY_CARE_PROVIDER_SITE_OTHER): Admitting: Family Medicine

## 2024-02-16 DIAGNOSIS — Z7901 Long term (current) use of anticoagulants: Secondary | ICD-10-CM

## 2024-02-16 NOTE — Telephone Encounter (Signed)
 Received call from Dorothe at [facility] regarding patient.   She reports that they will start running patient's insurance through today, however it will be pushing it to get visit scheduled for tomorrow. She reports that it will likely be Wednesday 12/31, before they can make it to see patient.   Spoke with Margit who advised that other agencies are not scheduling until the end of this week/beginning of next week.   Home Health agency will be reaching out to patient once insurance processes claim and they can get visit scheduled.   Chiquita JAYSON English, RN

## 2024-02-16 NOTE — Telephone Encounter (Signed)
 I -placed a home health order for [name] needs to be sent to :[facility] [name], phone [phone]  For an INR home blood draw She just had back surgery and cannot safely leave the house for a week or 2 and has restarted on her coumadin  Ideally they can do INR today but hopefully no later tyhan tomorrow I have placed order in EPIC Please text me or call me on my cell with questions (I am on hospital service today_ [phone]) [facility]!

## 2024-02-16 NOTE — Telephone Encounter (Signed)
 [facility] Telemedicine Visit  Patient consented to have virtual visit and was identified by name and date of birth. Method of visit: Telephone  Encounter participants: Patient: [name] - located at home Provider: Camie Mulch - located at office Others (if applicable): n/a  Chief Complaint: need for home blood draw  HPI:  Recent back surgery Cannot safely leave her house at this time Restarting on coumadin  Needs INR today (ideally) or no later than tomorrow  ROS: per HPI  Pertinent PMHx: recent back surgery Chronic anticoagulation  Exam:  There were no vitals taken for this visit.  Respiratory: unlabored  Assessment/Plan: Home health order placed No problem-specific Assessment & Plan notes found for this encounter.    Time spent during visit with patient: 11 minutes

## 2024-02-17 ENCOUNTER — Encounter: Payer: Self-pay | Admitting: Family Medicine

## 2024-02-25 ENCOUNTER — Other Ambulatory Visit: Payer: Self-pay

## 2024-02-25 ENCOUNTER — Encounter: Payer: Self-pay | Admitting: Family Medicine

## 2024-02-25 NOTE — Telephone Encounter (Signed)
 Dani RN [facility] calls nurse line to report PT INR.  PT: 25.6 INR:2.4  Will forward to PCP.

## 2024-02-26 ENCOUNTER — Other Ambulatory Visit (HOSPITAL_COMMUNITY): Payer: Self-pay

## 2024-02-26 MED ORDER — ESOMEPRAZOLE MAGNESIUM 40 MG PO CPDR
40.0000 mg | DELAYED_RELEASE_CAPSULE | Freq: Every morning | ORAL | 3 refills | Status: AC
  Filled 2024-02-26 – 2024-03-11 (×5): qty 90, 90d supply, fill #0

## 2024-02-26 MED ORDER — DICLOFENAC SODIUM 75 MG PO TBEC
75.0000 mg | DELAYED_RELEASE_TABLET | Freq: Every day | ORAL | 3 refills | Status: AC | PRN
  Filled 2024-02-26: qty 180, 90d supply, fill #0

## 2024-02-26 MED ORDER — FAMOTIDINE 40 MG PO TABS
40.0000 mg | ORAL_TABLET | Freq: Every day | ORAL | 3 refills | Status: AC
  Filled 2024-02-26: qty 90, 90d supply, fill #0

## 2024-02-26 MED ORDER — FAMOTIDINE 40 MG PO TABS: 40.0000 mg | ORAL_TABLET | Freq: Every day | ORAL | 0 refills | Status: DC

## 2024-02-26 MED ORDER — COLESTIPOL HCL 1 G PO TABS: 2.0000 g | ORAL_TABLET | Freq: Every day | ORAL | 11 refills | Status: AC

## 2024-02-26 MED ORDER — FLUCONAZOLE 100 MG PO TABS
100.0000 mg | ORAL_TABLET | ORAL | 3 refills | Status: AC
  Filled 2024-02-26: qty 12, 84d supply, fill #0

## 2024-03-01 ENCOUNTER — Other Ambulatory Visit (HOSPITAL_COMMUNITY): Payer: Self-pay

## 2024-03-01 ENCOUNTER — Other Ambulatory Visit: Payer: Self-pay

## 2024-03-01 MED FILL — Quetiapine Fumarate Tab 25 MG: ORAL | 90 days supply | Qty: 90 | Fill #0 | Status: AC

## 2024-03-02 ENCOUNTER — Other Ambulatory Visit (HOSPITAL_COMMUNITY): Payer: Self-pay

## 2024-03-03 ENCOUNTER — Other Ambulatory Visit (HOSPITAL_COMMUNITY): Payer: Self-pay

## 2024-03-03 ENCOUNTER — Other Ambulatory Visit (INDEPENDENT_AMBULATORY_CARE_PROVIDER_SITE_OTHER): Payer: Self-pay

## 2024-03-03 ENCOUNTER — Other Ambulatory Visit: Payer: Self-pay | Admitting: Family Medicine

## 2024-03-03 DIAGNOSIS — Z7901 Long term (current) use of anticoagulants: Secondary | ICD-10-CM | POA: Diagnosis not present

## 2024-03-03 LAB — POCT INR: INR: 2.4 (ref 2.0–3.0)

## 2024-03-03 MED ORDER — APIXABAN 5 MG PO TABS
5.0000 mg | ORAL_TABLET | Freq: Two times a day (BID) | ORAL | 0 refills | Status: AC
  Filled 2024-03-03: qty 180, 90d supply, fill #0

## 2024-03-03 MED FILL — Alprazolam Tab 0.5 MG: ORAL | 30 days supply | Qty: 90 | Fill #0 | Status: AC

## 2024-03-03 NOTE — Progress Notes (Signed)
 Asked by Dr. Rosalynn to assist with transition from Warfarin to Eliquis  (apixaban )   INR today 2.4  Agreed to use 5mg  BID starting in two days  Duration anticipated 3 months and then decrease to 2.5mg  BID for long-term anticoagulation.   Patient educated on purpose, proper use and potential adverse effects of bruising, bleeding.   Following instruction patient verbalized understanding of treatment plan.   Medication Samples have been provided to the patient.  Drug name: Eliquis  (apixaban )        Strength: 5mg         Qty: 28  LOT: [id number]  Exp.Date: 05/18/2024  Dosing instructions: 1 BID  The patient has been instructed regarding the correct time, dose, and frequency of taking this medication, including desired effects and most common side effects.   [name] 11:27 AM 03/03/2024

## 2024-03-03 NOTE — Progress Notes (Signed)
 Hold coumadin  Wednesday and Thursday. Start Eliquis  5 mg BID Friday. Follow up in 3 months. Lamar CROME Stephan Draughn

## 2024-03-08 ENCOUNTER — Other Ambulatory Visit: Payer: Self-pay

## 2024-03-08 ENCOUNTER — Other Ambulatory Visit (HOSPITAL_BASED_OUTPATIENT_CLINIC_OR_DEPARTMENT_OTHER): Payer: Self-pay

## 2024-03-08 MED FILL — Furosemide Tab 20 MG: ORAL | 90 days supply | Qty: 360 | Fill #0 | Status: AC

## 2024-03-09 ENCOUNTER — Other Ambulatory Visit (HOSPITAL_COMMUNITY): Payer: Self-pay

## 2024-03-11 ENCOUNTER — Other Ambulatory Visit (HOSPITAL_COMMUNITY): Payer: Self-pay

## 2024-03-11 MED ORDER — ESOMEPRAZOLE MAGNESIUM 40 MG PO CPDR
40.0000 mg | DELAYED_RELEASE_CAPSULE | Freq: Every day | ORAL | 3 refills | Status: AC
  Filled 2024-03-11: qty 90, 90d supply, fill #0

## 2024-03-12 ENCOUNTER — Other Ambulatory Visit: Payer: Self-pay

## 2024-03-12 ENCOUNTER — Other Ambulatory Visit (HOSPITAL_COMMUNITY): Payer: Self-pay

## 2024-07-26 ENCOUNTER — Encounter
